# Patient Record
Sex: Female | Born: 1975 | Race: White | Hispanic: No | Marital: Married | Smoking: Former smoker
Health system: Southern US, Community
[De-identification: ages and names within clinical notes are randomized; demographics above are authoritative.]

## PROBLEM LIST (undated history)

## (undated) ENCOUNTER — Ambulatory Visit: Admission: EM

## (undated) DIAGNOSIS — Z803 Family history of malignant neoplasm of breast: Secondary | ICD-10-CM

## (undated) DIAGNOSIS — Z923 Personal history of irradiation: Secondary | ICD-10-CM

## (undated) DIAGNOSIS — I10 Essential (primary) hypertension: Secondary | ICD-10-CM

## (undated) DIAGNOSIS — N159 Renal tubulo-interstitial disease, unspecified: Secondary | ICD-10-CM

## (undated) DIAGNOSIS — D649 Anemia, unspecified: Secondary | ICD-10-CM

## (undated) DIAGNOSIS — R569 Unspecified convulsions: Secondary | ICD-10-CM

## (undated) DIAGNOSIS — S82843A Displaced bimalleolar fracture of unspecified lower leg, initial encounter for closed fracture: Secondary | ICD-10-CM

## (undated) DIAGNOSIS — K219 Gastro-esophageal reflux disease without esophagitis: Secondary | ICD-10-CM

## (undated) DIAGNOSIS — M549 Dorsalgia, unspecified: Secondary | ICD-10-CM

## (undated) DIAGNOSIS — C50919 Malignant neoplasm of unspecified site of unspecified female breast: Secondary | ICD-10-CM

## (undated) HISTORY — PX: SPINE SURGERY: SHX786

## (undated) HISTORY — PX: UPPER GASTROINTESTINAL ENDOSCOPY: SHX188

## (undated) HISTORY — PX: BACK SURGERY: SHX140

## (undated) HISTORY — DX: Essential (primary) hypertension: I10

## (undated) HISTORY — PX: WISDOM TOOTH EXTRACTION: SHX21

## (undated) HISTORY — DX: Dorsalgia, unspecified: M54.9

## (undated) HISTORY — DX: Gastro-esophageal reflux disease without esophagitis: K21.9

## (undated) HISTORY — DX: Family history of malignant neoplasm of breast: Z80.3

## (undated) HISTORY — PX: COLONOSCOPY: SHX174

---

## 2000-08-23 ENCOUNTER — Other Ambulatory Visit: Admission: RE | Admit: 2000-08-23 | Discharge: 2000-08-23 | Payer: Self-pay | Admitting: Gynecology

## 2003-11-30 ENCOUNTER — Encounter: Admission: RE | Admit: 2003-11-30 | Discharge: 2003-11-30 | Payer: Self-pay | Admitting: Family Medicine

## 2003-12-29 ENCOUNTER — Other Ambulatory Visit: Admission: RE | Admit: 2003-12-29 | Discharge: 2003-12-29 | Payer: Self-pay | Admitting: Family Medicine

## 2004-07-22 ENCOUNTER — Inpatient Hospital Stay (HOSPITAL_COMMUNITY): Admission: EM | Admit: 2004-07-22 | Discharge: 2004-07-24 | Payer: Self-pay

## 2004-07-22 ENCOUNTER — Encounter (INDEPENDENT_AMBULATORY_CARE_PROVIDER_SITE_OTHER): Payer: Self-pay | Admitting: *Deleted

## 2005-05-16 LAB — HM COLONOSCOPY

## 2005-08-14 ENCOUNTER — Other Ambulatory Visit: Admission: RE | Admit: 2005-08-14 | Discharge: 2005-08-14 | Payer: Self-pay | Admitting: Obstetrics and Gynecology

## 2006-02-12 ENCOUNTER — Inpatient Hospital Stay (HOSPITAL_COMMUNITY): Admission: AD | Admit: 2006-02-12 | Discharge: 2006-02-16 | Payer: Self-pay | Admitting: Obstetrics and Gynecology

## 2006-02-13 ENCOUNTER — Encounter (INDEPENDENT_AMBULATORY_CARE_PROVIDER_SITE_OTHER): Payer: Self-pay | Admitting: Specialist

## 2006-02-17 ENCOUNTER — Encounter: Admission: RE | Admit: 2006-02-17 | Discharge: 2006-03-19 | Payer: Self-pay | Admitting: Obstetrics and Gynecology

## 2006-03-20 ENCOUNTER — Encounter: Admission: RE | Admit: 2006-03-20 | Discharge: 2006-04-18 | Payer: Self-pay | Admitting: Obstetrics and Gynecology

## 2006-04-09 ENCOUNTER — Ambulatory Visit: Payer: Self-pay | Admitting: Family Medicine

## 2006-05-27 ENCOUNTER — Ambulatory Visit: Payer: Self-pay | Admitting: Family Medicine

## 2006-08-01 ENCOUNTER — Ambulatory Visit: Payer: Self-pay | Admitting: Internal Medicine

## 2006-09-18 DIAGNOSIS — K219 Gastro-esophageal reflux disease without esophagitis: Secondary | ICD-10-CM | POA: Insufficient documentation

## 2006-09-25 ENCOUNTER — Ambulatory Visit: Payer: Self-pay | Admitting: Internal Medicine

## 2006-09-25 LAB — CONVERTED CEMR LAB
Bilirubin Urine: NEGATIVE
Hemoglobin, Urine: NEGATIVE
Ketones, ur: NEGATIVE mg/dL
Nitrite: NEGATIVE
Protein, ur: NEGATIVE mg/dL
RBC / HPF: NONE SEEN (ref <3)
Specific Gravity, Urine: 1.01 (ref 1.005–1.03)
Urine Glucose: NEGATIVE mg/dL
Urobilinogen, UA: 0.2 (ref 0.0–1.0)
pH: 6 (ref 5.0–8.0)

## 2006-09-27 ENCOUNTER — Ambulatory Visit: Payer: Self-pay | Admitting: Family Medicine

## 2006-09-27 LAB — CONVERTED CEMR LAB
Basophils Absolute: 0 10*3/uL (ref 0.0–0.1)
Basophils Relative: 0.2 % (ref 0.0–1.0)
Chlamydia, DNA Probe: NEGATIVE
Eosinophils Absolute: 0.1 10*3/uL (ref 0.0–0.6)
Eosinophils Relative: 0.8 % (ref 0.0–5.0)
GC Probe Amp, Genital: NEGATIVE
HCT: 34.1 % — ABNORMAL LOW (ref 36.0–46.0)
Hemoglobin: 11.8 g/dL — ABNORMAL LOW (ref 12.0–15.0)
Lymphocytes Relative: 12.9 % (ref 12.0–46.0)
MCHC: 34.7 g/dL (ref 30.0–36.0)
MCV: 83.8 fL (ref 78.0–100.0)
Monocytes Absolute: 0.9 10*3/uL — ABNORMAL HIGH (ref 0.2–0.7)
Monocytes Relative: 9.1 % (ref 3.0–11.0)
Neutro Abs: 7.7 10*3/uL (ref 1.4–7.7)
Neutrophils Relative %: 77 % (ref 43.0–77.0)
Platelets: 284 10*3/uL (ref 150–400)
RBC: 4.07 M/uL (ref 3.87–5.11)
RDW: 11.9 % (ref 11.5–14.6)
WBC: 10 10*3/uL (ref 4.5–10.5)

## 2006-10-09 ENCOUNTER — Ambulatory Visit: Payer: Self-pay | Admitting: Internal Medicine

## 2006-11-21 ENCOUNTER — Ambulatory Visit: Payer: Self-pay | Admitting: Internal Medicine

## 2007-04-11 ENCOUNTER — Telehealth (INDEPENDENT_AMBULATORY_CARE_PROVIDER_SITE_OTHER): Payer: Self-pay | Admitting: *Deleted

## 2007-04-14 ENCOUNTER — Telehealth (INDEPENDENT_AMBULATORY_CARE_PROVIDER_SITE_OTHER): Payer: Self-pay | Admitting: Family Medicine

## 2007-06-25 ENCOUNTER — Ambulatory Visit: Payer: Self-pay | Admitting: Family Medicine

## 2007-06-25 DIAGNOSIS — K2970 Gastritis, unspecified, without bleeding: Secondary | ICD-10-CM | POA: Insufficient documentation

## 2007-06-25 DIAGNOSIS — K2990 Gastroduodenitis, unspecified, without bleeding: Secondary | ICD-10-CM

## 2007-06-25 DIAGNOSIS — M549 Dorsalgia, unspecified: Secondary | ICD-10-CM | POA: Insufficient documentation

## 2007-06-25 DIAGNOSIS — N912 Amenorrhea, unspecified: Secondary | ICD-10-CM | POA: Insufficient documentation

## 2007-06-25 LAB — CONVERTED CEMR LAB
Beta hcg, urine, semiquantitative: NEGATIVE
Bilirubin Urine: NEGATIVE
Blood in Urine, dipstick: NEGATIVE
Glucose, Urine, Semiquant: NEGATIVE
Ketones, urine, test strip: NEGATIVE
Nitrite: NEGATIVE
Protein, U semiquant: NEGATIVE
Specific Gravity, Urine: 1.01
Urobilinogen, UA: NEGATIVE
WBC Urine, dipstick: NEGATIVE
pH: 6.5

## 2007-08-05 ENCOUNTER — Telehealth (INDEPENDENT_AMBULATORY_CARE_PROVIDER_SITE_OTHER): Payer: Self-pay | Admitting: *Deleted

## 2007-08-13 ENCOUNTER — Telehealth (INDEPENDENT_AMBULATORY_CARE_PROVIDER_SITE_OTHER): Payer: Self-pay | Admitting: *Deleted

## 2007-08-29 ENCOUNTER — Ambulatory Visit: Payer: Self-pay | Admitting: Family Medicine

## 2007-10-07 ENCOUNTER — Telehealth (INDEPENDENT_AMBULATORY_CARE_PROVIDER_SITE_OTHER): Payer: Self-pay | Admitting: *Deleted

## 2007-10-08 ENCOUNTER — Ambulatory Visit: Payer: Self-pay | Admitting: Family Medicine

## 2007-10-08 ENCOUNTER — Encounter (INDEPENDENT_AMBULATORY_CARE_PROVIDER_SITE_OTHER): Payer: Self-pay | Admitting: Family Medicine

## 2007-10-08 DIAGNOSIS — IMO0002 Reserved for concepts with insufficient information to code with codable children: Secondary | ICD-10-CM

## 2007-10-08 LAB — CONVERTED CEMR LAB
Bilirubin Urine: NEGATIVE
Blood in Urine, dipstick: NEGATIVE
Glucose, Urine, Semiquant: NEGATIVE
Ketones, urine, test strip: NEGATIVE
Nitrite: NEGATIVE
Protein, U semiquant: NEGATIVE
Specific Gravity, Urine: 1.02
Urobilinogen, UA: NEGATIVE
WBC Urine, dipstick: NEGATIVE
pH: 6.5

## 2007-10-09 ENCOUNTER — Telehealth (INDEPENDENT_AMBULATORY_CARE_PROVIDER_SITE_OTHER): Payer: Self-pay | Admitting: *Deleted

## 2007-10-15 ENCOUNTER — Telehealth (INDEPENDENT_AMBULATORY_CARE_PROVIDER_SITE_OTHER): Payer: Self-pay | Admitting: *Deleted

## 2007-11-18 ENCOUNTER — Telehealth (INDEPENDENT_AMBULATORY_CARE_PROVIDER_SITE_OTHER): Payer: Self-pay | Admitting: *Deleted

## 2007-11-18 ENCOUNTER — Ambulatory Visit: Payer: Self-pay | Admitting: Family Medicine

## 2007-11-18 DIAGNOSIS — Z87898 Personal history of other specified conditions: Secondary | ICD-10-CM

## 2007-11-27 ENCOUNTER — Telehealth (INDEPENDENT_AMBULATORY_CARE_PROVIDER_SITE_OTHER): Payer: Self-pay | Admitting: *Deleted

## 2007-11-27 ENCOUNTER — Ambulatory Visit: Payer: Self-pay | Admitting: Family Medicine

## 2007-11-28 ENCOUNTER — Encounter: Payer: Self-pay | Admitting: Family Medicine

## 2007-12-03 ENCOUNTER — Encounter (INDEPENDENT_AMBULATORY_CARE_PROVIDER_SITE_OTHER): Payer: Self-pay | Admitting: *Deleted

## 2008-01-21 ENCOUNTER — Telehealth (INDEPENDENT_AMBULATORY_CARE_PROVIDER_SITE_OTHER): Payer: Self-pay | Admitting: *Deleted

## 2008-01-22 ENCOUNTER — Ambulatory Visit: Payer: Self-pay | Admitting: Family Medicine

## 2008-02-02 ENCOUNTER — Telehealth (INDEPENDENT_AMBULATORY_CARE_PROVIDER_SITE_OTHER): Payer: Self-pay | Admitting: *Deleted

## 2008-02-02 ENCOUNTER — Ambulatory Visit: Payer: Self-pay | Admitting: Family Medicine

## 2008-02-02 LAB — CONVERTED CEMR LAB
Bilirubin Urine: NEGATIVE
Blood in Urine, dipstick: NEGATIVE
Glucose, Urine, Semiquant: NEGATIVE
Ketones, urine, test strip: NEGATIVE
Nitrite: NEGATIVE
Protein, U semiquant: NEGATIVE
Specific Gravity, Urine: 1.02
Urobilinogen, UA: NEGATIVE
WBC Urine, dipstick: NEGATIVE
pH: 6.5

## 2008-02-13 ENCOUNTER — Ambulatory Visit: Payer: Self-pay | Admitting: Internal Medicine

## 2008-03-03 ENCOUNTER — Telehealth (INDEPENDENT_AMBULATORY_CARE_PROVIDER_SITE_OTHER): Payer: Self-pay | Admitting: *Deleted

## 2008-03-11 ENCOUNTER — Ambulatory Visit: Payer: Self-pay | Admitting: Family Medicine

## 2008-03-15 ENCOUNTER — Encounter: Admission: RE | Admit: 2008-03-15 | Discharge: 2008-03-15 | Payer: Self-pay | Admitting: Family Medicine

## 2008-03-16 ENCOUNTER — Telehealth (INDEPENDENT_AMBULATORY_CARE_PROVIDER_SITE_OTHER): Payer: Self-pay | Admitting: *Deleted

## 2008-03-19 ENCOUNTER — Telehealth (INDEPENDENT_AMBULATORY_CARE_PROVIDER_SITE_OTHER): Payer: Self-pay | Admitting: *Deleted

## 2008-03-29 ENCOUNTER — Telehealth (INDEPENDENT_AMBULATORY_CARE_PROVIDER_SITE_OTHER): Payer: Self-pay | Admitting: *Deleted

## 2008-04-09 ENCOUNTER — Encounter: Payer: Self-pay | Admitting: Family Medicine

## 2008-05-27 ENCOUNTER — Telehealth (INDEPENDENT_AMBULATORY_CARE_PROVIDER_SITE_OTHER): Payer: Self-pay | Admitting: *Deleted

## 2008-05-28 ENCOUNTER — Telehealth: Payer: Self-pay | Admitting: Family Medicine

## 2008-06-03 ENCOUNTER — Encounter: Payer: Self-pay | Admitting: Family Medicine

## 2008-06-03 ENCOUNTER — Other Ambulatory Visit: Admission: RE | Admit: 2008-06-03 | Discharge: 2008-06-03 | Payer: Self-pay | Admitting: Family Medicine

## 2008-06-03 ENCOUNTER — Ambulatory Visit: Payer: Self-pay | Admitting: Family Medicine

## 2008-06-07 ENCOUNTER — Encounter: Payer: Self-pay | Admitting: Family Medicine

## 2008-06-10 ENCOUNTER — Encounter (INDEPENDENT_AMBULATORY_CARE_PROVIDER_SITE_OTHER): Payer: Self-pay | Admitting: *Deleted

## 2008-06-17 ENCOUNTER — Telehealth (INDEPENDENT_AMBULATORY_CARE_PROVIDER_SITE_OTHER): Payer: Self-pay | Admitting: *Deleted

## 2008-09-17 ENCOUNTER — Ambulatory Visit: Payer: Self-pay | Admitting: Family Medicine

## 2008-09-17 LAB — CONVERTED CEMR LAB: Rapid Strep: NEGATIVE

## 2008-09-21 ENCOUNTER — Telehealth (INDEPENDENT_AMBULATORY_CARE_PROVIDER_SITE_OTHER): Payer: Self-pay | Admitting: *Deleted

## 2008-09-28 ENCOUNTER — Encounter (INDEPENDENT_AMBULATORY_CARE_PROVIDER_SITE_OTHER): Payer: Self-pay | Admitting: *Deleted

## 2008-09-30 ENCOUNTER — Ambulatory Visit: Payer: Self-pay | Admitting: Family Medicine

## 2008-09-30 DIAGNOSIS — L0291 Cutaneous abscess, unspecified: Secondary | ICD-10-CM | POA: Insufficient documentation

## 2008-09-30 DIAGNOSIS — L0390 Cellulitis, unspecified: Secondary | ICD-10-CM

## 2008-11-19 ENCOUNTER — Ambulatory Visit: Payer: Self-pay | Admitting: Family Medicine

## 2008-11-19 ENCOUNTER — Encounter: Payer: Self-pay | Admitting: Family Medicine

## 2008-11-19 DIAGNOSIS — D239 Other benign neoplasm of skin, unspecified: Secondary | ICD-10-CM

## 2008-11-19 HISTORY — DX: Other benign neoplasm of skin, unspecified: D23.9

## 2008-11-23 ENCOUNTER — Encounter (INDEPENDENT_AMBULATORY_CARE_PROVIDER_SITE_OTHER): Payer: Self-pay | Admitting: *Deleted

## 2008-11-25 ENCOUNTER — Ambulatory Visit: Payer: Self-pay | Admitting: Family Medicine

## 2009-06-01 ENCOUNTER — Ambulatory Visit: Payer: Self-pay | Admitting: Internal Medicine

## 2009-06-01 DIAGNOSIS — M545 Low back pain, unspecified: Secondary | ICD-10-CM | POA: Insufficient documentation

## 2009-06-01 DIAGNOSIS — R319 Hematuria, unspecified: Secondary | ICD-10-CM

## 2009-06-01 LAB — CONVERTED CEMR LAB
Bilirubin Urine: NEGATIVE
Glucose, Urine, Semiquant: NEGATIVE
Ketones, urine, test strip: NEGATIVE
Nitrite: NEGATIVE
Protein, U semiquant: NEGATIVE
Specific Gravity, Urine: <1.005
Urobilinogen, UA: 0.2
pH: 5

## 2009-06-06 ENCOUNTER — Telehealth (INDEPENDENT_AMBULATORY_CARE_PROVIDER_SITE_OTHER): Payer: Self-pay | Admitting: *Deleted

## 2009-09-19 ENCOUNTER — Telehealth: Payer: Self-pay | Admitting: Family Medicine

## 2009-09-20 ENCOUNTER — Telehealth (INDEPENDENT_AMBULATORY_CARE_PROVIDER_SITE_OTHER): Payer: Self-pay | Admitting: *Deleted

## 2009-12-16 ENCOUNTER — Encounter: Payer: Self-pay | Admitting: Family Medicine

## 2010-01-17 ENCOUNTER — Encounter: Payer: Self-pay | Admitting: Family Medicine

## 2010-01-27 ENCOUNTER — Encounter: Payer: Self-pay | Admitting: Family Medicine

## 2010-01-31 ENCOUNTER — Encounter: Payer: Self-pay | Admitting: Family Medicine

## 2010-08-15 ENCOUNTER — Encounter: Payer: Self-pay | Admitting: Internal Medicine

## 2010-08-15 ENCOUNTER — Ambulatory Visit
Admission: RE | Admit: 2010-08-15 | Discharge: 2010-08-15 | Payer: Self-pay | Source: Home / Self Care | Attending: Family Medicine | Admitting: Family Medicine

## 2010-08-15 DIAGNOSIS — R591 Generalized enlarged lymph nodes: Secondary | ICD-10-CM | POA: Insufficient documentation

## 2010-08-15 DIAGNOSIS — R599 Enlarged lymph nodes, unspecified: Secondary | ICD-10-CM | POA: Insufficient documentation

## 2010-08-27 ENCOUNTER — Encounter: Payer: Self-pay | Admitting: Family Medicine

## 2010-09-07 NOTE — Consult Note (Signed)
Summary: [facility]  [facility]   Imported By: Lanelle Bal 02/17/2010 11:12:54  _____________________________________________________________________  External Attachment:    Type:   Image     Comment:   External Document

## 2010-09-07 NOTE — Assessment & Plan Note (Signed)
Summary: chest congestion//fever//lch   Vital Signs:  Patient profile:   35 year old female Height:      63 inches (160.02 cm) Weight:      127.50 pounds (57.95 kg) BMI:     22.67 Temp:     100.5 degrees F (38.06 degrees C) oral Resp:     13 per minute BP sitting:   110 / 74  (left arm)  Vitals Entered By: Lucious Groves CMA (August 15, 2010 12:25 PM) CC: Possible URI or strep./kb, URI symptoms Is Patient Diabetic? No Pain Assessment Patient in pain? no      Comments Patient notes that she has been having fever, sore throat, and HA. She denies congestion, cough, mucous production, SOB, and chest pain./kb   Primary Care Provider:  Laury Axon  CC:  Possible URI or strep./kb and URI symptoms.  History of Present Illness: URI Symptoms      This is a 35 year old woman who presents with URI symptoms; onset as severe ST 01/09, progressive through the night.  The patient denies purulent nasal discharge, dry cough, and earache.  Associated symptoms include fever of 100.5-103 degrees.  The patient denies stiff neck, dyspnea, and wheezing.  The patient denies headache.  Risk factors for Strep sinusitis include tender adenopathy.  The patient denies the following risk factors for Strep sinusitis: unilateral facial pain and tooth pain.    Current Medications (verified): 1)  Valtrex 1 Gm  Tabs (Valacyclovir Hcl) .Marland Kitchen.. 1 Three Times A Day] 2)  Ortho Tri-Cyclen (28) 0.035 Mg  Tabs (Norgestimate-Ethinyl Estradiol) .... As Directed. 3)  Meloxicam 15 Mg  Tabs (Meloxicam) .Marland Kitchen.. 1 By Mouth Once Daily 4)  Flexeril 10 Mg Tabs (Cyclobenzaprine Hcl) .Marland Kitchen.. 1 By Mouth Three Times A Day As Needed.  Allergies (verified): 1)  ! Reglan 2)  ! Septra  Physical Exam  General:  Uncomfortable but in no acute distress; alert,appropriate and cooperative throughout examination Ears:  External ear exam shows no significant lesions or deformities.  Otoscopic examination reveals clear canals, tympanic membranes are intact  bilaterally without bulging, retraction, inflammation or discharge. Hearing is grossly normal bilaterally. Nose:  External nasal examination shows no deformity or inflammation. Nasal mucosa are pink and moist without lesions or exudates. Mouth:   severe pharyngeal erythema ; 1.5 +  tonsil hypertrophy Lungs:  Normal respiratory effort, chest expands symmetrically. Lungs are clear to auscultation, no crackles or wheezes. Heart:  Normal rate and regular rhythm. S1 and S2 normal without gallop, murmur, click, rub.S4 Abdomen:  Bowel sounds positive,abdomen soft and non-tender without masses, organomegaly or hernias noted. Cervical Nodes:  Enlarged LA bilaterally Axillary Nodes:  No palpable lymphadenopathy   Impression & Recommendations:  Problem # 1:  PHARYNGITIS-ACUTE (ICD-462)  Her updated medication list for this problem includes:    Meloxicam 15 Mg Tabs (Meloxicam) .Marland Kitchen... 1 by mouth once daily    Azithromycin 250 Mg Tabs (Azithromycin) .Marland Kitchen... As per pack  Orders: Rapid Strep (19147): negative Monospot (82956): negative  Problem # 2:  CERVICAL LYMPHADENOPATHY (ICD-785.6)  Orders: Rapid Strep (21308) Monospot (65784)  Her updated medication list for this problem includes:    Azithromycin 250 Mg Tabs (Azithromycin) .Marland Kitchen... As per pack  Complete Medication List: 1)  Valtrex 1 Gm Tabs (Valacyclovir hcl) .Marland Kitchen.. 1 three times a day] 2)  Ortho Tri-cyclen (28) 0.035 Mg Tabs (Norgestimate-ethinyl estradiol) .... As directed. 3)  Meloxicam 15 Mg Tabs (Meloxicam) .Marland Kitchen.. 1 by mouth once daily 4)  Flexeril 10 Mg Tabs (  Cyclobenzaprine hcl) .Marland Kitchen.. 1 by mouth three times a day as needed. 5)  Prednisone 20 Mg Tabs (Prednisone) .Marland Kitchen.. 1 two times a day with meals 6)  Azithromycin 250 Mg Tabs (Azithromycin) .... As per pack  Patient Instructions: 1)  Zicam Melts as needed for ST. 2)  Drink as much NON dairy  fluid as you can tolerate for the next few days. 3)  Take 650-1000mg  of Tylenol every 4-6 hours as  needed for relief of pain or comfort of fever AVOID taking more than 4000mg   in a 24 hour period (can cause liver damage in higher doses) OR take 400-600mg  of Ibuprofen (Advil, Motrin) with food every 4-6 hours as needed for relief of pain or comfort of fever. Prescriptions: AZITHROMYCIN 250 MG TABS (AZITHROMYCIN) as per pack  #1 x 0   Entered and Authorized by:   Marga Melnick MD   Signed by:   Marga Melnick MD on 08/15/2010   Method used:   [facility] [phone]* (retail)       [address]       [place], [place]  [address]       Ph: [phone]       Fax: [phone]   RxID:   [id number] PREDNISONE 20 MG TABS (PREDNISONE) 1 two times a day with meals  #10 x 0   Entered and Authorized by:   Marga Melnick MD   Signed by:   Marga Melnick MD on 08/15/2010   Method used:   Electronically to        [facility] [phone]* (retail)       [address]       [place], [place]  [address]       Ph: [phone]       Fax: [phone]   RxID:   [id number]    Orders Added: 1)  Est. Patient Level III [30160] 2)  Rapid Strep [87880] 3)  Monospot [10932]

## 2010-09-07 NOTE — Letter (Signed)
Summary: Out of Work  [facility] [employer]  [address] [place], [place] [address]   Phone: [phone]  Fax: [phone]    August 15, 2010   Employee:  ABIE KILLIAN    To Whom It May Concern:   For Medical reasons, please excuse the above named employee from work for the following dates:  Start:   08/15/10  End:   08/17/10  If you need additional information, please feel free to contact our office.         Sincerely,    Marga Melnick MD

## 2010-09-07 NOTE — Letter (Signed)
Summary: [facility]  [facility]   Imported By: Lanelle Bal 02/14/2010 12:44:43  _____________________________________________________________________  External Attachment:    Type:   Image     Comment:   External Document

## 2010-09-07 NOTE — Progress Notes (Signed)
Summary: refill  Phone Note Refill Request Message from:  Fax from Pharmacy on September 19, 2009 12:24 PM  cyclobenzaprine 10mg , [facility] [address] fax [phone]   Method Requested: Fax to Local Pharmacy Next Appointment Scheduled: no appt Initial call taken by: Barb Merino,  September 19, 2009 12:25 PM  Follow-up for Phone Call        last ov- 06/01/09 (acute with dr. hopper) last filled 07/05/09. Army Fossa CMA  September 19, 2009 1:21 PM   Additional Follow-up for Phone Call Additional follow up Details #1::        ok to refill x1 Additional Follow-up by: Loreen Freud DO,  September 19, 2009 1:55 PM    Prescriptions: FLEXERIL 10 MG TABS (CYCLOBENZAPRINE HCL) 1 by mouth three times a day as needed.  #30 x 0   Entered by:   Army Fossa CMA   Authorized by:   Loreen Freud DO   Signed by:   Army Fossa CMA on 09/19/2009   Method used:   [facility]ronically to        [facility]  [phone]* (retail)       [address] [place], [place]  [address]       Ph: [phone] or [phone]       Fax: [phone]   RxID:   [id number]

## 2010-09-07 NOTE — Progress Notes (Signed)
Summary: Refill Request  Phone Note Refill Request Message from:  [facility]arm[facility] Fax #: [phone]  Refills Requested: Medication #1:  Cyclobenzaprine 10mg  tablet, take 1 tab 3x a day prn   Dosage confirmed as above?Dosage Confirmed Initial call taken by: Harold Barban,  September 20, 2009 2:10 PM  Follow-up for Phone Call        Spoke with pharmacist they recieved rx yesterday. Army Fossa CMA  September 20, 2009 2:43 PM

## 2010-09-07 NOTE — Consult Note (Signed)
Summary: [facility]  [facility]   Imported By: Lanelle Bal 01/04/2010 14:11:26  _____________________________________________________________________  External Attachment:    Type:   Image     Comment:   External Document

## 2010-09-07 NOTE — Consult Note (Signed)
Summary: [facility]  [facility]   Imported By: Lanelle Bal 02/09/2010 10:30:55  _____________________________________________________________________  External Attachment:    Type:   Image     Comment:   External Document

## 2010-10-17 ENCOUNTER — Ambulatory Visit (INDEPENDENT_AMBULATORY_CARE_PROVIDER_SITE_OTHER): Payer: 59 | Admitting: Family Medicine

## 2010-10-17 ENCOUNTER — Encounter: Payer: Self-pay | Admitting: Family Medicine

## 2010-10-17 DIAGNOSIS — M25539 Pain in unspecified wrist: Secondary | ICD-10-CM

## 2010-10-17 DIAGNOSIS — M259 Joint disorder, unspecified: Secondary | ICD-10-CM | POA: Insufficient documentation

## 2010-10-17 HISTORY — DX: Pain in unspecified wrist: M25.539

## 2010-10-18 ENCOUNTER — Telehealth: Payer: Self-pay | Admitting: Family Medicine

## 2010-10-24 NOTE — Assessment & Plan Note (Signed)
Summary: Left wrist swollen  (pt is rt handed)///sph   Vital Signs:  Patient profile:   35 year old female Weight:      122.4 pounds Temp:     98.1 degrees F oral BP sitting:   120 / 74  (right arm) Cuff size:   regular  Vitals Entered By: Almeta Monas CMA Duncan Dull) (October 17, 2010 11:31 AM) CC: x3weeks c/o left wrist pain and swelling   History of Present Illness: Pt here c/o L wrist pain and lump.  No injury.  Current Medications (verified): 1)  Valtrex 1 Gm  Tabs (Valacyclovir Hcl) .Marland Kitchen.. 1 Three Times A Day] 2)  Ortho Tri-Cyclen (28) 0.035 Mg  Tabs (Norgestimate-Ethinyl Estradiol) .... As Directed. 3)  Meloxicam 15 Mg  Tabs (Meloxicam) .Marland Kitchen.. 1 By Mouth Once Daily 4)  Flexeril 10 Mg Tabs (Cyclobenzaprine Hcl) .Marland Kitchen.. 1 By Mouth Three Times A Day As Needed.  Allergies (verified): 1)  ! Reglan 2)  ! Septra  Past History:  Past medical, surgical, family and social histories (including risk factors) reviewed for relevance to current acute and chronic problems.  Past Medical History: Reviewed history from 02/13/2008 and no changes required. GERD GENITAL HERPES, HX OF (ICD-V13.8) BACK PAIN (ICD-724.5)  Past Surgical History: Reviewed history from 02/13/2008 and no changes required. had a colonoscopy aprox. 2006  Family History: Reviewed history from 06/03/2008 and no changes required. none  Social History: Reviewed history from 06/03/2008 and no changes required. Occupation:  Dance movement psychotherapist Married Current Smoker Alcohol use-yes Drug use-no Regular exercise-yes  Review of Systems      See HPI  Physical Exam  General:  Well-developed,well-nourished,in no acute distress; alert,appropriate and cooperative throughout examination Msk:  L wrist--- dorsal side--- + swelling, soft pain with flexion Psych:  Cognition and judgment appear intact. Alert and cooperative with normal attention span and concentration. No apparent delusions,  illusions, hallucinations   Impression & Recommendations:  Problem # 1:  WRIST PAIN, LEFT (ICD-719.43) ortho referral held at pt request Orders: Ankle / Wrist Splint (A4570)  Complete Medication List: 1)  Valtrex 1 Gm Tabs (Valacyclovir hcl) .Marland Kitchen.. 1 three times a day] 2)  Ortho Tri-cyclen (28) 0.035 Mg Tabs (Norgestimate-ethinyl estradiol) .... As directed. 3)  Meloxicam 15 Mg Tabs (Meloxicam) .Marland Kitchen.. 1 by mouth once daily 4)  Flexeril 10 Mg Tabs (Cyclobenzaprine hcl) .Marland Kitchen.. 1 by mouth three times a day as needed.   Orders Added: 1)  Ankle / Wrist Splint [A4570] 2)  Est. Patient Level III [16109]

## 2010-10-24 NOTE — Progress Notes (Signed)
Summary: wrist no better  Phone Note Call from Patient Call back at Home Phone [phone]   Caller: Patient Summary of Call: Pt states that wirst swelling and pain has increse despite wearing brace all night. Pt note that she is unable to move fingers without them hurting. Pls advise.........Marland KitchenFelecia Deloach CMA  October 18, 2010 10:02 AM   Follow-up for Phone Call        she needs to see ortho--- I thought she said she already had any appointment.  we can see if we can make it sooner. Follow-up by: Loreen Freud DO,  October 18, 2010 10:43 AM  Additional Follow-up for Phone Call Additional follow up Details #1::        I spoke with patient and she advised that she had an appt on the 19th for a f/u.... I called [facility] and advised of the situation and they are willing to see her tomorrow at 2pm.... Almeta Monas CMA Duncan Dull)  October 18, 2010 2:26 PM  Additional Follow-up by: Almeta Monas CMA Duncan Dull),  October 18, 2010 2:26 PM

## 2010-12-22 NOTE — Op Note (Signed)
[name], [name]             ACCOUNT NO.:  [id number]   MEDICAL RECORD NO.:  [id number]          PATIENT TYPE:  INP   LOCATION:  3007                         FACILITY:  [facility]   PHYSICIAN:  John C. Madilyn Fireman, M.D.    DATE OF BIRTH:  05/30/1976   DATE OF PROCEDURE:  07/22/2004  DATE OF DISCHARGE:                                 OPERATIVE REPORT   PROCEDURE PERFORMED:  Colonoscopy with biopsy.   ENDOSCOPIST:  Barrie Folk, M.D.   INDICATIONS FOR PROCEDURE:  Rectal bleeding with abdominal cramps.   DESCRIPTION OF PROCEDURE:  The patient was placed in the left lateral  decubitus position and placed on the pulse monitor with continuous low-flow  oxygen delivered by nasal cannula.  The patient was sedated with 80 mcg of  IV fentanyl and 10 mg of IV Versed.  The Olympus video colonoscope was  inserted into the rectum and advanced to the cecum, confirmed by  transillumination of McBurney's point and visualization of the ileocecal  valve and appendiceal orifice.  The study was done unprepped and there was  good visualization up to about 50 to 60 cm where there was some semiformed  stool coating a large portion of the walls.  However, with lavage, I could  see representative areas of most of the colon.  The cecum, ascending,  transverse colon appeared normal within the limitations of the prep.  At  approximately 40 cm there was a transition to erythema granularity and  possible submucosal hemorrhages with friability and some bloody fluid noted  in the lumen.  This became confluent over about 5 cm, persisted down to  about 30 cm and then became patchy down to about 24 cm where it transitioned  back into normal mucosa.  The normal mucosa persisted down to the rectum.  There were no diverticula, no ulcers, arteriovenous malformations or polyps  seen.  Biopsies were taken of the most visibly inflamed areas.  The scope  was then withdrawn and the patient returned to the recovery room in stable  condition.  She tolerated the procedure well.  There were no immediate  complications.   IMPRESSION:  Colitis of the descending and sigmoid colon with appearance and  distribution most suggestive of ischemia although inflammatory bowel disease  or infectious colitis also in differential diagnosis.   PLAN:  Will begin Cipro for now.  Will await biopsy results and will obtain  stool for enteric pathogens.       JCH/MEDQ  D:  07/22/2004  T:  07/24/2004  Job:  [id number]

## 2010-12-22 NOTE — H&P (Signed)
[name], [name]             ACCOUNT NO.:  [id number]   MEDICAL RECORD NO.:  [id number]          PATIENT TYPE:  INP   LOCATION:  9174                          [facility]  [facility]   PHYSICIAN:  Guy Sandifer. Henderson Cloud, M.D. DATE OF BIRTH:  1976-03-18   DATE OF ADMISSION:  02/12/2006  DATE OF DISCHARGE:                                HISTORY & PHYSICAL   CHIEF COMPLAINT:  Low estimated fetal weight.   HISTORY OF PRESENT ILLNESS:  This patient is a 35 year old, married white  female, gravida 1, para 0, with an EDC of February 09, 2006 who had a regularly  scheduled visit today in the office.  Cervix is 2-3 cm dilated, 50% effaced,  -3 station.  Evaluation included a nonstress test which was reactive.  Ultrasound revealed a vertex infant consistent with 2365 grams which is less  than one-third percentile as well as an AFI of 3.9 which is less than the  third percentile. Bio-physical 6 out of 8.  The patient denies vaginal  bleeding, leaking of fluid, blurred vision, headache, or epigastric pain.   Past medical history, past surgical history, family history, obstetric  history:  See prenatal history and physical.   MEDICATIONS:  Prenatal vitamins.   ALLERGIES:  REGLAN leading to lock jaw.   SOCIAL HISTORY:  Denies tobacco, alcohol or drug abuse.   PHYSICAL EXAMINATION:  VITAL SIGNS:  Height 5 feet 3 inches, weight 159  pounds, blood pressure 118/64.  LUNGS:  Clear to auscultation.  HEART:  Regular rate and rhythm.  BREASTS: Not examined.  ABDOMEN:  Gravid.  No epigastric tenderness.  PELVIC:  Cervix 2-3 cm dilated, 50% effaced, -3 station.  Posterior vertex.  EXTREMITIES:  1+ edema.  Deep tendon reflexes 2+ without clonus.   ASSESSMENT:  1.  Intrauterine pregnancy at 40-3/7 weeks.  2.  Oligohydramnios.  3.  Intrauterine growth retardation.   PLAN:  Two-stage induction of labor.  The plan has been reviewed and  questions have been answered with the patient.     Guy Sandifer Henderson Cloud,  M.D.  Electronically Signed    JET/MEDQ  D:  02/12/2006  T:  02/12/2006  Job:  [id number]

## 2010-12-22 NOTE — H&P (Signed)
NAMEELIZZIE, [name]             ACCOUNT NO.:  [id number]   MEDICAL RECORD NO.:  [id number]          PATIENT TYPE:  INP   LOCATION:  1826                         FACILITY:  [facility]   PHYSICIAN:  John C. Madilyn Fireman, M.D.    DATE OF BIRTH:  10/20/1975   DATE OF ADMISSION:  07/21/2004  DATE OF DISCHARGE:                                HISTORY & PHYSICAL   CHIEF COMPLAINT:  Rectal bleeding.   HISTORY OF PRESENT ILLNESS:  The patient is a 35 year old healthy white  female who began having lower abdominal cramps, urgency, and diarrhea at  about 8:30 p.m. on December 16 after two diarrheal bowel movements that  transitioned to what she states is pure blood.  She has had about eight  bloody bowel movements since then with variable intensity cramps throughout.  She denies nausea, vomiting, fever, orthostasis, or melenic stools and felt  fine yesterday. She denies any recent travel or recent antibiotic use or any  suspicious food intake. She has no prior history of GI bleeding.  She does  not use nonsteroidal anti-inflammatory drugs.  She has had intermittent  bouts of diarrhea associated with sweating and a faint feeling in the past,  occurring about once every three months but has never been associated with  blood.   PAST MEDICAL HISTORY:  History of pyelonephritis requiring hospitalization  seven or eight years ago.   PAST SURGICAL HISTORY:  None.   MEDICATIONS:  1.  Ortho Tri-Cyclen.  2.  Prilosec.   ALLERGIES:  1.  REGLAN.  2.  HYDROCODONE.   SOCIAL HISTORY:  The patient is married.  She drinks alcohol occasionally.  She smokes about one-half pack of cigarettes a day.   PHYSICAL EXAMINATION:  GENERAL:  Well-developed, well-nourished white female  in no acute distress.  VITAL SIGNS:  Stable, and she is afebrile.  HEART:  Regular rate and rhythm without murmur.  LUNGS:  Clear.  ABDOMEN:  Soft, nondistended.  Normoactive bowel sounds.  No  hepatosplenomegaly, mass, or guarding.  There is mild diffuse tenderness in  the lower abdomen, equal on the right and the left.   LABORATORY DATA AND OTHER STUDIES:  Hemoglobin 11.8, 12.9 on recheck.  WBC  8900, platelets 336,000.  Prothrombin time 13.9.  SMA-24 completely normal.   Abdominal CT scan shows a right hepatic cyst, otherwise unremarkable, with  some subcentimeter lymph nodes near the terminal ileum suggesting possible  mesenteric adenitis.   IMPRESSION:  Lower gastrointestinal bleed, etiology unclear.  Could be  secondary to arteriovenous malformation, diverticulosis, Meckel's  diverticulum, or some sort of colitis.   PLAN:  Will probably pursue un prepped flexible sigmoidoscopy or colonoscopy  later today as initial diagnostic study.       JCH/MEDQ  D:  07/22/2004  T:  07/22/2004  Job:  [id number]   cc:   Leanne Chang, M.D.  [address]  [place]  [place] [address]  Fax: [phone]

## 2010-12-22 NOTE — Discharge Summary (Signed)
[name], [name]             ACCOUNT NO.:  [id number]   MEDICAL RECORD NO.:  [id number]          PATIENT TYPE:  INP   LOCATION:  9148                          [facility]ILITY[facility]  [facility]   PHYSICIAN:  Diana Kid. Rana Kelly, M.D.    DATE OF BIRTH:  11-Feb-1976   DATE OF ADMISSION:  02/12/2006  DATE OF DISCHARGE:  02/16/2006                                 DISCHARGE SUMMARY   ADMITTING DIAGNOSES:  1. Intrauterine pregnancy at 76 and four-sevenths weeks estimated      gestational age.  2. Small-for-gestational-age infant.  3. Low amniotic fluid index.  4. Two-stage induction of labor.   DISCHARGE DIAGNOSES:  1. Status post low transverse cesarean section secondary to nonreassuring      fetal heart tones.  2. Viable female infant.   PROCEDURE:  Primary low transverse cesarean section.   REASON FOR ADMISSION:  Please see written H&P.   HOSPITAL COURSE:  The patient is a 35 year old white married female  primigravida that was admitted to [facility] for a two-  stage induction of labor.  The patient had been seen in the office where  ultrasound had been performed which estimated fetal weight was noted to be  at 2400 g which was less than the 3rd percentile, and AFI was noted to be  3.8 which was also less than the 3rd percentile.  Cervix was examined and  found to be 2-3 cm dilated, 50% effaced, vertex at -3 station.  After  discussion with the patient she was now admitted to the hospital for a two-  stage induction of labor.  Cervidil was placed and after several hours the  patient did begin to have a contraction pattern with a variable deceleration  pattern noted.  Decelerations were noted to be in the 50s and 60s which had  a late appearance.  Cervidil was now removed and the patient was  administered oxygen and IV fluids.  Cervix was reexamined and noted to be  without change.  Due to nonreassuring fetal heart tones and cervix  nonfavorable for vaginal delivery, decision was made  to proceed with a  primary low transverse cesarean section.  The patient was now transferred to  the operating room where spinal anesthesia was administered without  difficulty.  A low transverse incision was made with the delivery of a  viable female infant weighing 4 pounds 12 ounces with Apgars of 8 at one  minute and 9 at five minutes.  Arterial cord pH was 7.30.  The patient  tolerated the procedure well and was taken to the recovery room in stable  condition.  On postoperative day #1 the patient was without complaint.  Vital signs were stable.  She was afebrile.  Abdomen soft with good return  of bowel function.  Fundus was firm and nontender.  Abdominal dressing was  noted to have a small amount of drainage noted on bandage.  Laboratory  findings revealed hemoglobin of 10.5; platelet count of 233,000; wbc count  of 12.0.  On postoperative day #2 the patient was without complaint.  She  was tolerating a regular diet  without complaints of nausea and vomiting.  Vital signs were stable.  She was afebrile.  Abdominal dressing had been  removed revealing an incision that was clean, dry and intact.  On  postoperative day #3 the patient was without complaint.  Vital signs were  stable.  She was afebrile.  Fundus firm and nontender.  Incision was clean,  dry and intact.  Staples removed and the patient was later discharged home.   CONDITION ON DISCHARGE:  Stable.   DIET REGULAR:  As tolerated.   ACTIVITY:  No heavy lifting, no driving x2 weeks, no vaginal entry.   FOLLOWUP:  The patient is to follow up in the office in 2 weeks for an  incision check.  She is to call for temperature greater than 100 degrees,  persistent nausea and vomiting, heavy vaginal bleeding, and/or redness or  drainage from the incisional site.   DISCHARGE MEDICATIONS:  1. Tylox #30 one p.o. every 4-6 hours p.r.n.  2. Motrin 600 mg every 6 hours.  3. Prenatal vitamins one p.o. daily.  4. Colace one p.o. daily  p.r.n.      [name], N.P.      [name], M.D.  Electronically Signed    CC/MEDQ  D:  03/25/2006  T:  03/25/2006  Job:  [id number]

## 2010-12-22 NOTE — Op Note (Signed)
NAMESATOMI, [name]             ACCOUNT NO.:  [id number]   MEDICAL RECORD NO.:  [id number]          PATIENT TYPE:  INP   LOCATION:  9148                          FACILITY:  [facility]   PHYSICIAN:  Guy Sandifer. Henderson Cloud, M.D. DATE OF BIRTH:  [birth year]   DATE OF PROCEDURE:  02/13/2006  DATE OF DISCHARGE:                                 OPERATIVE REPORT   PREOPERATIVE DIAGNOSES:  1.  Intrauterine pregnancy at 40-4/7 weeks' estimated additional age.  2.  Nonreassuring fetal heart tracing.   POSTOPERATIVE DIAGNOSES:  1.  Intrauterine pregnancy at 40-4/7 weeks' estimated additional age.  2.  Nonreassuring fetal heart tracing.   PROCEDURE:  Low transverse cesarean section.   SURGEON:  Guy Sandifer. Henderson Cloud, M.D.   ANESTHESIA:  Spinal, Belva Agee, M.D.   ESTIMATED BLOOD LOSS:  600 mL.   FINDINGS:  Viable female infant, Apgars of 8 and 9 at one and five minutes,  respectively.  Arterial cord pH 7.30.  Birth weight 4 pounds 12 ounces.   INDICATIONS AND CONSENT:  This patient is a 35 year old married white female  G1 P0 who in evaluation in the office today was found to have an estimated  fetal weight on ultrasound of approximately 2400 grams which, was less than  the 3rd percentile, and an AFI of 3.8, which was less than the 3rd  percentile.  Cervix is 2-3 cm dilated, 50% effaced, -3 station and vertex.  Group B strep culture is positive.  After discussion with the patient, she  is admitted to hospital for a two-stage induction of labor.  Cervidil is  placed.  After several hours of Cervidil the patient begins to contract with  variable decelerations noted.  Some become down to the 50s to 60s and have a  late appearance.  They become repetitive.  The Cervidil was removed.  The  patient is given oxygen and intravenous fluids.  Cervix is without change.  This is discussed with the patient and her husband.  Cesarean section is  recommended.  Potential risks and complications are discussed  preoperatively, including but not limited to infection; bowel, bladder or  ureteral damage; bleeding requiring transfusion of blood products with  possible transfusion reaction, HIV and hepatitis acquisition; DVT, PE and  pneumonia.  All questions are answered and consent is signed on the chart.   PROCEDURE:  The patient is taken to the operating room, where she is  identified, a spinal anesthetic is placed and she is placed in the dorsal  supine position with a 15-degree left lateral wedge.  She is then prepped  with Hibiclens, the Foley catheter was placed and the bladder is drained,  and she is draped in a sterile fashion.  After testing for adequate spinal  anesthesia, the skin is entered through a Pfannenstiel incision and  dissection is carried out in layers to the peritoneum.  The peritoneum is  incised, extended superiorly and inferiorly.  Vesicouterine peritoneum is  taken down cephalolaterally, the bladder flap is developed, and the bladder  blade is placed.  The u is then incised in a low transverse manner.  The  uterine cavity is entered bluntly with a hemostat and the uterine incision  is extended cephalolaterally with the fingers.  A small amount of clear  fluid is noted.  Vertex is easily delivered and a single nuchal cord is  reduced.  Baby is delivered, oropharynx and nasopharynx are suctioned, and  good cry and tone is noted.  Cord is clamped and cut and the baby is handed  to the waiting pediatrics team.  Placenta is manually delivered and sent to  pathology.  Uterine cavity is cleaned.  Uterus closed in two running locking  imbricating layers of 0 Monocryl suture, which achieves good hemostasis.  Tubes and ovaries are normal bilaterally.  Anterior peritoneum is closed in  running fashion with 0 Monocryl suture, which is also used to reapproximate  the pyramidalis muscle in the midline.  The anterior rectus fascia is closed  in a running fashion with 0 PDS suture and the  skin is closed with clips.  All sponge, instrument and needle counts are correct and the patient is  taken to the recovery room in stable condition.      Guy Sandifer Henderson Cloud, M.D.  Electronically Signed     JET/MEDQ  D:  02/13/2006  T:  02/13/2006  Job:  [id number]

## 2011-01-16 ENCOUNTER — Ambulatory Visit (INDEPENDENT_AMBULATORY_CARE_PROVIDER_SITE_OTHER): Payer: 59 | Admitting: Family Medicine

## 2011-01-16 DIAGNOSIS — K5900 Constipation, unspecified: Secondary | ICD-10-CM | POA: Insufficient documentation

## 2011-01-16 HISTORY — DX: Constipation, unspecified: K59.00

## 2011-01-16 NOTE — Progress Notes (Signed)
  Subjective:    Patient ID: [name], female    DOB: Mar 13, 1976, 35 y.o.   MRN: [id number]  HPI Constipation- pt's sxs started ~4-6 weeks ago.  Is on daily chronic narcotics.  Reports she has never been 'a once a day kind of girl'.  Did an enema Saturday w/ results.  Did one again last night w/out results.  Did Miralax Saturday morning, taking 2 stool softeners daily, did 'philips' last night.  Having nausea and vomiting w/ eating.   Review of Systems For ROS see HPI     Objective:   Physical Exam  Constitutional: She appears well-developed and well-nourished. No distress.  Abdominal: Soft. Bowel sounds are normal. She exhibits distension. There is tenderness (mild, diffuse TTP).          Assessment & Plan:

## 2011-01-16 NOTE — Assessment & Plan Note (Signed)
Pt's constipation is due to her chronic use of pain meds.  Should increase to miralax 2-3x/day, daily enemas, and continue stool softeners.  Stressed importance of increased fluid intake.  Reviewed red flags that should prompt immediate return.  Pt expressed understanding and is in agreement w/ plan.

## 2011-01-16 NOTE — Patient Instructions (Signed)
This is constipation related Increase the Miralax to 2-3x/day Start daily enemas- at least until you have a big blowout Continue the stool softeners (up to 4 daily) Once you move your bowels, continue daily stool softeners and once daily miralax Hang in there!!!

## 2011-03-05 ENCOUNTER — Ambulatory Visit (INDEPENDENT_AMBULATORY_CARE_PROVIDER_SITE_OTHER): Payer: 59 | Admitting: Family Medicine

## 2011-03-05 ENCOUNTER — Encounter: Payer: Self-pay | Admitting: Family Medicine

## 2011-03-05 DIAGNOSIS — A6009 Herpesviral infection of other urogenital tract: Secondary | ICD-10-CM

## 2011-03-05 DIAGNOSIS — A6000 Herpesviral infection of urogenital system, unspecified: Secondary | ICD-10-CM

## 2011-03-05 MED ORDER — VALACYCLOVIR HCL 1 G PO TABS: 1000.0000 mg | ORAL_TABLET | Freq: Three times a day (TID) | ORAL | Status: DC

## 2011-03-05 MED ORDER — CYCLOBENZAPRINE HCL 10 MG PO TABS: 10.0000 mg | ORAL_TABLET | Freq: Three times a day (TID) | ORAL | Status: DC | PRN

## 2011-03-05 NOTE — Patient Instructions (Signed)
Follow up as needed w/ Dr Laury Axon Take the Valtrex for the outbreak Call with any questions or concerns Hang in there!

## 2011-03-05 NOTE — Assessment & Plan Note (Signed)
Pt w/ current outbreak.  Refill provided on meds.  Pt reports outbreaks are not frequent enough to require daily suppressive meds.  Pt should not need appt for each outbreak- refills to be called in prn.  Pt expressed understanding and is in agreement w/ plan.

## 2011-03-05 NOTE — Progress Notes (Signed)
  Subjective:    Patient ID: [name], female    DOB: 1976/08/02, 35 y.o.   MRN: [id number]  HPI Herpes Outbreak- previously dx'd, current meds are expired.  Reports rarely having outbreaks.  Painful.  Typically takes 3 tabs daily x7days.  Review of Systems For ROS see HPI     Objective:   Physical Exam  Constitutional: She appears well-developed and well-nourished. No distress.  Genitourinary:       Deferred due to previous dx          Assessment & Plan:   No problem-specific assessment & plan notes found for this encounter.

## 2011-05-28 ENCOUNTER — Ambulatory Visit (INDEPENDENT_AMBULATORY_CARE_PROVIDER_SITE_OTHER): Payer: 59 | Admitting: Family Medicine

## 2011-05-28 ENCOUNTER — Encounter: Payer: Self-pay | Admitting: Family Medicine

## 2011-05-28 VITALS — BP 110/60 | HR 85 | Temp 98.9°F | Wt 130.8 lb

## 2011-05-28 DIAGNOSIS — K1370 Unspecified lesions of oral mucosa: Secondary | ICD-10-CM

## 2011-05-28 NOTE — Patient Instructions (Signed)
We will refer you to an ENT for further evaluation of the lesion in your mouth

## 2011-05-28 NOTE — Progress Notes (Signed)
  Subjective:    Patient ID: [name], female    DOB: 01-05-1976, 35 y.o.   MRN: [id number]  HPI Pt here c/o lesion under her tongue.  She noticed it only a few days ago.  Non tender. No other complaints.    Review of Systems As above    Objective:   Physical Exam  Constitutional: She appears well-developed and well-nourished.  HENT:       + white , soft, non tender area at base of frenulem          Assessment & Plan:  Lesion in mouth--- refer to ENT for possible biopsy

## 2011-07-09 ENCOUNTER — Telehealth: Payer: Self-pay | Admitting: Family Medicine

## 2011-07-09 DIAGNOSIS — M549 Dorsalgia, unspecified: Secondary | ICD-10-CM

## 2011-07-09 NOTE — Telephone Encounter (Signed)
Left message to call office to get more details about reason for referral.

## 2011-07-09 NOTE — Telephone Encounter (Signed)
Referral put in     KP 

## 2011-07-09 NOTE — Telephone Encounter (Signed)
Ok to refer to [facility] for second opinion Mri will need to be faxed

## 2011-07-09 NOTE — Telephone Encounter (Signed)
Pt state that she would like to get referred for a second opinion prior to proceed with surgery. Pt notes that we referred her to current Ortho. Pt would like to go to [facility] if they are in network. Please advise

## 2011-08-27 ENCOUNTER — Encounter: Payer: Self-pay | Admitting: Vascular Surgery

## 2011-08-28 ENCOUNTER — Encounter: Payer: 59 | Admitting: Vascular Surgery

## 2011-09-03 ENCOUNTER — Encounter: Payer: Self-pay | Admitting: Vascular Surgery

## 2011-09-04 ENCOUNTER — Encounter: Payer: Self-pay | Admitting: Vascular Surgery

## 2011-09-04 ENCOUNTER — Ambulatory Visit (INDEPENDENT_AMBULATORY_CARE_PROVIDER_SITE_OTHER): Payer: BC Managed Care – PPO | Admitting: Vascular Surgery

## 2011-09-04 VITALS — BP 120/74 | HR 87 | Resp 18 | Ht 63.0 in | Wt 129.0 lb

## 2011-09-04 DIAGNOSIS — M5020 Other cervical disc displacement, unspecified cervical region: Secondary | ICD-10-CM | POA: Insufficient documentation

## 2011-09-04 DIAGNOSIS — IMO0002 Reserved for concepts with insufficient information to code with codable children: Secondary | ICD-10-CM | POA: Insufficient documentation

## 2011-09-04 NOTE — Progress Notes (Signed)
Patient presents today for discussion of anterior exposure for lumbar disc disease surgery. She has been seen by Dr. Paula Libra is recommended anterior exposure for L5-S1 disc disease. She does not have any history of prior arterial or venous disease. No history of DVT. She does have a history of prior cesarean section years ago. She has had no other abdominal surgery.  Past Medical History  Diagnosis Date  . GERD (gastroesophageal reflux disease)   . 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 Back pain     History  Substance Use Topics  . Smoking status: Former Smoker -- 0.5 packs/day for 10 years    Types: Cigarettes  . Smokeless tobacco: Never Used  . Alcohol Use: Yes    Family History  Problem Relation Age of Onset  . Lupus Mother     Allergies  Allergen Reactions  . Ciprofloxacin   . Metoclopramide Hcl   . Reglan   . Sulfamethoxazole W/Trimethoprim     Current outpatient prescriptions:cyclobenzaprine (FLEXERIL) 10 MG tablet, Take 1 tablet (10 mg total) by mouth 3 (three) times daily as needed., Disp: 60 tablet, Rfl: 3;  meloxicam (MOBIC) 15 MG tablet, Take 15 mg by mouth daily.  , Disp: , Rfl: ;  oxycodone (OXYCONTIN) 30 MG TB12, Take 30 mg by mouth every 12 (twelve) hours.  , Disp: , Rfl:  oxyCODONE-acetaminophen (PERCOCET) 10-325 MG per tablet, Take 1 tablet by mouth as directed. 5x per day , Disp: , Rfl: ;  valACYclovir (VALTREX) 1000 MG tablet, Take 1 tablet (1,000 mg total) by mouth 3 (three) times daily., Disp: 60 tablet, Rfl: 3;  Norgestim-Eth Estrad Triphasic (ORTHO TRI-CYCLEN, 28, PO), Take 0.035 mg by mouth daily.  , Disp: , Rfl:   BP 120/74  Pulse 87  Resp 18  Ht 5\' 3"  (1.6 m)  Wt 129 lb (58.514 kg)  BMI 22.85 kg/m2  Body mass index is 22.85 kg/(m^2).       Review of systems negative except for history of present illness and past medical history  Physical exam: Well-developed well-nourished thin white female in no acute distress. HEENT normal. She has normal radial  and 2+ dorsalis pedis pulses. Abdominal exam reveals no masses she does have a low suprapubic transverse incision.  Impression and plan: L5-S1 disc disease with planned anterior exposure for this surgery. I discussed my role and exposure. I explained to mobilization of intraperitoneal contents left ureter and iliac vessels. I explained potential injury of all these. I explained this is quite uncommon. She understands and wished to proceed with surgery when the schedule by Dr. Shelle Iron

## 2011-09-11 ENCOUNTER — Telehealth: Payer: Self-pay | Admitting: *Deleted

## 2011-09-11 NOTE — Telephone Encounter (Signed)
Noted incoming doctor's line call, Octavio Graves MD advised that he wanted to speak with MD Laury Axon concerning pt [name] his daughter per MD Laury Axon has sent pt for a referral and he wants to speak with MD Laury Axon about her, noted Father MD Octavio Graves is not listed as pts contact information to be given and based on hippa we can not release any information to him, that she would have to sign a form releasing him to have her information, MD Riley Lam asked if she could call in a verbal release, I advised that she has to come in the office, please also note that the MD Octavio Graves was wrong about pt birthdate he has to think for a minute about the date and gave [birth year], however this pt DOB is [birth year], MD Lowne aware verbally about call. MD Octavio Graves stated he will have his daughter come into office to sign form of release, he understood instructions

## 2011-09-27 ENCOUNTER — Other Ambulatory Visit: Payer: Self-pay | Admitting: Otolaryngology

## 2011-10-01 ENCOUNTER — Other Ambulatory Visit: Payer: Self-pay | Admitting: *Deleted

## 2011-10-08 ENCOUNTER — Telehealth: Payer: Self-pay | Admitting: *Deleted

## 2011-10-08 NOTE — Telephone Encounter (Signed)
Received call on doctors line from pt father, stating his name is Spero Geralds, caller gave pt name and DOB as [phone] and was not sure of the exact date, caller wanted to speak with MD Laury Axon concerning pt upcoming surgery to discuss her opinion about the surgery, I advised that per [facility] regulations I can not give him any information about the pt, caller stated that he has been asking her to come by the office to sign a release for him to talk with our office concerning her care, caller asked if just speaking with MD Laury Axon would really be about his daughters healthcare? This nurse advised that this would be a violation of the pt rights and we can not discuss any information concerning the pt, the caller understood and stated " I will call her again to get her in there to get that form signed" and ended the call. MD Lowne advised verbally.

## 2011-10-17 ENCOUNTER — Telehealth: Payer: Self-pay | Admitting: *Deleted

## 2011-10-17 ENCOUNTER — Ambulatory Visit (INDEPENDENT_AMBULATORY_CARE_PROVIDER_SITE_OTHER): Payer: BC Managed Care – PPO | Admitting: Family Medicine

## 2011-10-17 ENCOUNTER — Encounter: Payer: Self-pay | Admitting: Family Medicine

## 2011-10-17 VITALS — BP 122/84 | HR 105 | Temp 100.2°F | Ht 63.0 in | Wt 130.8 lb

## 2011-10-17 DIAGNOSIS — J069 Acute upper respiratory infection, unspecified: Secondary | ICD-10-CM | POA: Insufficient documentation

## 2011-10-17 MED ORDER — GUAIFENESIN-CODEINE 100-10 MG/5ML PO SYRP: 10.0000 mL | ORAL_SOLUTION | Freq: Three times a day (TID) | ORAL | Status: AC | PRN

## 2011-10-17 MED ORDER — BENZONATATE 200 MG PO CAPS: 200.0000 mg | ORAL_CAPSULE | Freq: Three times a day (TID) | ORAL | Status: AC | PRN

## 2011-10-17 NOTE — Progress Notes (Signed)
  Subjective:    Patient ID: [name], female    DOB: 11/28/1975, 36 y.o.   MRN: [id number]  HPI Cough/fever- sxs started Sunday.  On PCN for root canal done on Friday.  Cough is wet but not productive.  Mild nasal congestion.  No ear pain.  No known sick contacts.  No N/V/D.   Review of Systems For ROS see HPI     Objective:   Physical Exam  Vitals reviewed. Constitutional: She appears well-developed and well-nourished. No distress.  HENT:  Head: Normocephalic and atraumatic.       TMs normal bilaterally Mild nasal congestion No TTP over sinuses Throat w/out erythema, edema, or exudate  Eyes: Conjunctivae and EOM are normal. Pupils are equal, round, and reactive to light.  Neck: Normal range of motion. Neck supple.  Cardiovascular: Normal rate, regular rhythm, normal heart sounds and intact distal pulses.   No murmur heard. Pulmonary/Chest: Effort normal and breath sounds normal. No respiratory distress. She has no wheezes.       + hacking cough  Lymphadenopathy:    She has no cervical adenopathy.          Assessment & Plan:

## 2011-10-17 NOTE — Patient Instructions (Signed)
This is an upper respiratory illness Continue the penicillin Drink plenty of fluids Add Mucinex to thin your congestion Use the cough pills and syrup as needed (syrup will make you drowsy) Call with any questions or concerns Hang in there!!!

## 2011-10-17 NOTE — Assessment & Plan Note (Signed)
No evidence of bacterial infxn.  Already on abx due to root canal.  Cough meds prn.  Reviewed supportive care and red flags that should prompt return.  Pt expressed understanding and is in agreement w/ plan.

## 2011-10-17 NOTE — Telephone Encounter (Signed)
[facility] Triage Call Report Triage Record Num: [id number] Operator: Chevis Pretty Patient Name: [name] Call Date & Time: 10/17/2011 8:37:26AM Patient Phone: [phone] PCP: Patient Gender: Female PCP Fax : Patient DOB: 1975/10/30 Practice Name: [facility] Reason for Call: Caller: [name]/Patient; PCP: Lelon Perla.; CB#: [phone]; ; ; Call regarding Fever X. 2. Days; Root Canal Done 10/12/11; states saw dentist again, and she believes the fever is unrelated to the procedure. Has been taking PCN since procedure. Has cough as well. Temp to 102; currently 101.4. Per protocol, emergent symptoms denied; appt sched 10/17/11 1100 with Dr. Beverely Low. Protocol(s) Used: Cough - Adult Recommended Outcome per Protocol: See Provider within 24 hours Reason for Outcome: New onset or worsening cough AND temperature up to 101.14F (38.6C) AND not responding to 12 hours of home care Care Advice: Limit or avoid exposure to irritants and allergens (e.g. air pollution, smoke/smoking, chemicals, dust, pollen, pet dander, etc.) ~ ~ Call provider if symptoms worsen or new symptoms develop. ~ Avoid any activity that produces symptoms until evaluated by provider. During pregnancy, call provider if temperature is 100 F (37.7 C) or greater OR any temperature elevation for 3 days even while taking acetaminophen. ~ Call EMS 911 if develop new onset or increasing confusion or lethargy; breathing problems continue to worsen or skin becomes bluish/gray; develops chest pain. ~ For a fever during pregnancy, take temperature every 4 hours while awake, or more often with chills/sweats, until temperature returns to normal for 24 hours. ~ ~ SYMPTOM / CONDITION MANAGEMENT ~ CAUTIONS Analgesic/Antipyretic Advice - Acetaminophen: Consider acetaminophen as directed on label or by pharmacist/provider for pain or fever PRECAUTIONS: - Use if there is no history of liver disease,  alcoholism, or intake of three or more alcohol drinks per day - Only if approved by provider during pregnancy or when breastfeeding - During pregnancy, acetaminophen should not be taken more than 3 consecutive days without telling provider - Do not exceed recommended dose or frequency ~ Analgesic/Antipyretic Advice - NSAIDs: Consider aspirin, ibuprofen, naproxen or ketoprofen for pain or fever as directed on label or by pharmacist/provider. PRECAUTIONS: - If over 59 years of age, should not take longer than 1 week without consulting provider. EXCEPTIONS: - Should not be used if taking blood thinners or have bleeding problems. - Do not use if have history of sensitivity/allergy to any of these medications; or history of cardiovascular, ulcer, kidney, liver disease or diabetes unless approved by provider. - Do not exceed recommended dose or frequency.

## 2011-10-25 ENCOUNTER — Telehealth: Payer: Self-pay | Admitting: *Deleted

## 2011-10-25 NOTE — Telephone Encounter (Signed)
Spoke to father-he just wanted reassurance that Dr Shelle Iron was the right person to do the surgery.  Pt had a second opinion in [place] with Neurosurgeon----they agreed with surgery and ant approach.

## 2011-10-25 NOTE — Telephone Encounter (Signed)
Pt father called in requesting to speak to MD Lowne directly, I advised that MD Laury Axon is seeing pt's and has a very busy schedule is there anything this nurse could help him with, pt father Diana Dom MD (noted to call on MD line) wants to ask MD Lowne about pt upcoming back surgery per pt has hx with orthopedic's over the span of a few years and he is concerned with her having the surgery per she is so young and wants to ask MD Lowne's opinion about the surgery for the pt, I advised for pt father to contact the surgeon that is to perform the surgery to see what they advise, pt father advised that he would prefer to speak with MD Laury Axon about the surgery instead, noted pt signed: DPR signed 10/18/11 [facility]) for [name] ph@ [phone], please advise

## 2011-10-30 ENCOUNTER — Encounter (HOSPITAL_COMMUNITY): Payer: Self-pay | Admitting: Pharmacy Technician

## 2011-10-31 NOTE — H&P (Signed)
Chief Complaint:  back pain  Subjective: Patient is admitted for ALF L5-S1 possible L4-5  Patient is a 36 y.o. female who has been seen by Dr. Shelle Iron for ongoing back and LE pain  They have been followed and found to have continued progressive pain.  They have been treated conservatively in the past including medications.  Despite conservative measures, they continue to have pain.  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  It is felt that they would benefit from undergoing surgical intervention.  Risks and benefits have been discussed with the patient and they elect to proceed with surgery.  The patient has no contraindications to the upcoming procedure such as ongoing infection or progressive neurological disease.  Allergies: Allergies  Allergen Reactions  . Ciprofloxacin Diarrhea and Nausea And Vomiting  . Metoclopramide Hcl Other (See Comments)    "lock jaw"  . Sulfa Antibiotics Nausea And Vomiting     Medications: Mobic (15MG  Tablet, 1 Oral daily, Taken starting 08/17/2011) Flexeril (10MG  Tablet, Oral) Active. Percocet (10-325MG  Tablet, 1 Oral four times daily, as needed, Taken starting 10/08/2011) Active. OxyCONTIN (30MG  Tablet ER 12HR, 1 Oral q 12 hrs, Taken starting 10/08/2011) Active.  Past Medical History: Past Medical History  Diagnosis Date  . GERD (gastroesophageal reflux disease)   . 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 Back pain      Past Surgical History: Past Surgical History  Procedure Date  . Cesarean section   . Colonoscopy      Family History: Family History  Problem Relation Age of Onset  . Lupus Mother     Social History: History  Substance Use Topics  . Smoking status: Former Smoker -- 0.5 packs/day for 10 years    Types: Cigarettes  . Smokeless tobacco: Never Used  . Alcohol Use: Yes     Review of Systems  General:Not Present- Chills, Fever, Night Sweats, Appetite Loss, Fatigue, Feeling sick, Weight Gain and Weight  Loss. Skin:Not Present- Itching, Rash, Skin Color Changes, Ulcer, Psoriasis and Change in Hair or Nails. HEENT:Not Present- Sensitivity to light, Hearing problems, Nose Bleed and Ringing in the Ears. Neck:Not Present- Swollen Glands and Neck Mass. Respiratory:Not Present- Snoring, Chronic Cough, Bloody sputum and Dyspnea. Cardiovascular:Not Present- Shortness of Breath, Chest Pain, Swelling of Extremities, Leg Cramps and Palpitations. Gastrointestinal:Not Present- Bloody Stool, Heartburn, Abdominal Pain, Vomiting, Nausea and Incontinence of Stool. Female Genitourinary:Not Present- Blood in Urine, Menstrual Irregularities, Frequency, Incontinence and Nocturia. Musculoskeletal:Present- Muscle Weakness, Muscle Pain and Back Pain. Not Present- Joint Stiffness, Joint Swelling and Joint Pain. Neurological:Present- Tingling. Not Present- Numbness, Burning, Tremor, Headaches and Dizziness. Psychiatric:Not Present- Anxiety, Depression and Memory Loss. Endocrine:Not Present- Cold Intolerance, Heat Intolerance, Excessive hunger and Excessive Thirst. Hematology:Not Present- Abnormal Bleeding, Anemia, Blood Clots and Easy Bruising.  Physical Exam:  Vitals: Pulse:95 Respirations:10 Blood Pressure:122/76   General appearance: alert, cooperative and no distress Head: Normocephalic, without obvious abnormality, atraumatic Neck: no adenopathy, no carotid bruit, no JVD, supple, symmetrical, trachea midline and thyroid not enlarged, symmetric, no tenderness/mass/nodules Lungs: clear to auscultation bilaterally Heart: regular rate and rhythm, S1, S2 normal, no murmur, click, rub or gallop Abdomen: soft, non-tender; bowel sounds normal; no masses,  no organomegaly Extremities: extremities normal, atraumatic, no cyanosis or edema Skin: Skin color, texture, turgor normal. No rashes or lesions Back- generalized decrease ROM, negative SLR, motor 5/5 Assessment/Plan: DDD L5-S1 The patient is being  admitted to [facility] to undergo a  ALF L5-S1 possible L4-5.  Surgery  will be performed by Dr. Jene Every.  Risks and benefits have been discussed with the patient and they elect to proceed wth the procedure.

## 2011-11-01 NOTE — Pre-Procedure Instructions (Signed)
20 [name]  11/01/2011   Your procedure is scheduled on:  Wednesday November 14, 2011  Report to [facility] at 0630 AM.  Call this number if you have problems the morning of surgery: [phone]   Remember:   Do not eat food:After Midnight.  May have clear liquids: up to 4 Hours before arrival. (up to 2:30AM)  Clear liquids include soda, tea, black coffee, apple or grape juice, broth.  Take these medicines the morning of surgery with A SIP OF WATER: OXYCODONE   Do not wear jewelry, make-up or nail polish.  Do not wear lotions, powders, or perfumes. You may wear deodorant.  Do not shave 48 hours prior to surgery.  Do not bring valuables to the hospital.  Contacts, dentures or bridgework may not be worn into surgery.  Leave suitcase in the car. After surgery it may be brought to your room.  For patients admitted to the hospital, checkout time is 11:00 AM the day of discharge.   Patients discharged the day of surgery will not be allowed to drive home.  Name and phone number of your driver: NA  Special Instructions: Incentive Spirometry - Practice and bring it with you on the day of surgery. and CHG Shower Use Special Wash: 1/2 bottle night before surgery and 1/2 bottle morning of surgery.   Please read over the following fact sheets that you were given: Pain Booklet, Coughing and Deep Breathing, Blood Transfusion Information, MRSA Information and Surgical Site Infection Prevention

## 2011-11-05 ENCOUNTER — Inpatient Hospital Stay (HOSPITAL_COMMUNITY): Admission: RE | Admit: 2011-11-05 | Discharge: 2011-11-05 | Payer: BC Managed Care – PPO | Source: Ambulatory Visit

## 2011-11-06 ENCOUNTER — Inpatient Hospital Stay (HOSPITAL_COMMUNITY): Admission: RE | Admit: 2011-11-06 | Payer: BC Managed Care – PPO | Source: Ambulatory Visit

## 2011-11-08 ENCOUNTER — Encounter (HOSPITAL_COMMUNITY)
Admission: RE | Admit: 2011-11-08 | Discharge: 2011-11-08 | Disposition: A | Discharge status: Home / Self Care | Payer: BC Managed Care – PPO | Source: Ambulatory Visit | Attending: Otolaryngology | Admitting: Otolaryngology

## 2011-11-08 ENCOUNTER — Encounter (HOSPITAL_COMMUNITY): Payer: Self-pay

## 2011-11-08 ENCOUNTER — Encounter (HOSPITAL_COMMUNITY)
Admission: RE | Admit: 2011-11-08 | Discharge: 2011-11-08 | Disposition: A | Discharge status: Home / Self Care | Payer: BC Managed Care – PPO | Source: Ambulatory Visit | Attending: Specialist | Admitting: Specialist

## 2011-11-08 LAB — URINALYSIS, ROUTINE W REFLEX MICROSCOPIC
Glucose, UA: NEGATIVE mg/dL
Leukocytes, UA: NEGATIVE
Nitrite: NEGATIVE
Specific Gravity, Urine: 1.023 (ref 1.005–1.030)
pH: 5 (ref 5.0–8.0)

## 2011-11-08 LAB — COMPREHENSIVE METABOLIC PANEL
AST: 17 U/L (ref 0–37)
Albumin: 3.2 g/dL — ABNORMAL LOW (ref 3.5–5.2)
Alkaline Phosphatase: 75 U/L (ref 39–117)
BUN: 11 mg/dL (ref 6–23)
Chloride: 102 mEq/L (ref 96–112)
Potassium: 4 mEq/L (ref 3.5–5.1)
Sodium: 137 mEq/L (ref 135–145)
Total Bilirubin: 0.1 mg/dL — ABNORMAL LOW (ref 0.3–1.2)
Total Protein: 7.2 g/dL (ref 6.0–8.3)

## 2011-11-08 LAB — DIFFERENTIAL
Eosinophils Absolute: 0.3 10*3/uL (ref 0.0–0.7)
Lymphs Abs: 1.8 10*3/uL (ref 0.7–4.0)
Monocytes Absolute: 0.8 10*3/uL (ref 0.1–1.0)
Monocytes Relative: 7 % (ref 3–12)
Neutrophils Relative %: 74 % (ref 43–77)

## 2011-11-08 LAB — CBC
HCT: 31 % — ABNORMAL LOW (ref 36.0–46.0)
MCHC: 33.2 g/dL (ref 30.0–36.0)
Platelets: 412 10*3/uL — ABNORMAL HIGH (ref 150–400)
RDW: 12.9 % (ref 11.5–15.5)
WBC: 11.2 10*3/uL — ABNORMAL HIGH (ref 4.0–10.5)

## 2011-11-08 LAB — SURGICAL PCR SCREEN: Staphylococcus aureus: NEGATIVE

## 2011-11-08 LAB — TYPE AND SCREEN: ABO/RH(D): A POS

## 2011-11-08 LAB — HCG, SERUM, QUALITATIVE: Preg, Serum: NEGATIVE

## 2011-11-08 LAB — PROTIME-INR: INR: 1.07 (ref 0.00–1.49)

## 2011-11-13 MED ORDER — CEFAZOLIN SODIUM 1-5 GM-% IV SOLN
1.0000 g | INTRAVENOUS | Status: AC
  Administered 2011-11-14: 1 g via INTRAVENOUS
  Filled 2011-11-13: qty 50

## 2011-11-14 ENCOUNTER — Encounter (HOSPITAL_COMMUNITY)
Admission: RE | Disposition: A | Discharge status: Home / Self Care | Payer: Self-pay | Source: Ambulatory Visit | Attending: Specialist

## 2011-11-14 ENCOUNTER — Ambulatory Visit (HOSPITAL_COMMUNITY): Payer: BC Managed Care – PPO

## 2011-11-14 ENCOUNTER — Encounter (HOSPITAL_COMMUNITY): Payer: Self-pay | Admitting: Certified Registered"

## 2011-11-14 ENCOUNTER — Ambulatory Visit (HOSPITAL_COMMUNITY): Payer: BC Managed Care – PPO | Admitting: Certified Registered"

## 2011-11-14 ENCOUNTER — Inpatient Hospital Stay (HOSPITAL_COMMUNITY)
Admission: RE | Admit: 2011-11-14 | Discharge: 2011-11-18 | DRG: 756 | Disposition: A | Discharge status: Home / Self Care | Payer: BC Managed Care – PPO | Source: Ambulatory Visit | Attending: Specialist | Admitting: Specialist

## 2011-11-14 DIAGNOSIS — M5137 Other intervertebral disc degeneration, lumbosacral region: Principal | ICD-10-CM | POA: Diagnosis present

## 2011-11-14 DIAGNOSIS — K219 Gastro-esophageal reflux disease without esophagitis: Secondary | ICD-10-CM | POA: Diagnosis present

## 2011-11-14 DIAGNOSIS — M51379 Other intervertebral disc degeneration, lumbosacral region without mention of lumbar back pain or lower extremity pain: Principal | ICD-10-CM | POA: Diagnosis present

## 2011-11-14 DIAGNOSIS — Z01812 Encounter for preprocedural laboratory examination: Secondary | ICD-10-CM

## 2011-11-14 DIAGNOSIS — IMO0002 Reserved for concepts with insufficient information to code with codable children: Secondary | ICD-10-CM

## 2011-11-14 DIAGNOSIS — M549 Dorsalgia, unspecified: Secondary | ICD-10-CM

## 2011-11-14 DIAGNOSIS — Z87891 Personal history of nicotine dependence: Secondary | ICD-10-CM

## 2011-11-14 HISTORY — DX: Renal tubulo-interstitial disease, unspecified: N15.9

## 2011-11-14 HISTORY — PX: ANTERIOR LUMBAR FUSION: SHX1170

## 2011-11-14 LAB — POCT I-STAT 4, (NA,K, GLUC, HGB,HCT)
Hemoglobin: 9.5 g/dL — ABNORMAL LOW (ref 12.0–15.0)
Potassium: 3.9 mEq/L (ref 3.5–5.1)
Sodium: 139 mEq/L (ref 135–145)

## 2011-11-14 SURGERY — ANTERIOR LUMBAR FUSION 2 LEVELS
Anesthesia: General | Site: Abdomen | Wound class: Clean

## 2011-11-14 MED ORDER — SODIUM CHLORIDE 0.9 % IJ SOLN: 3.0000 mL | INTRAMUSCULAR | Status: DC | PRN

## 2011-11-14 MED ORDER — ACETAMINOPHEN 10 MG/ML IV SOLN
INTRAVENOUS | Status: AC
  Filled 2011-11-14: qty 100

## 2011-11-14 MED ORDER — RIVAROXABAN 10 MG PO TABS
10.0000 mg | ORAL_TABLET | Freq: Every day | ORAL | Status: DC
  Administered 2011-11-15 – 2011-11-18 (×5): 10 mg via ORAL
  Filled 2011-11-14 (×5): qty 1

## 2011-11-14 MED ORDER — DEXAMETHASONE SODIUM PHOSPHATE 4 MG/ML IJ SOLN
INTRAMUSCULAR | Status: DC | PRN
  Administered 2011-11-14: 4 mg via INTRAVENOUS

## 2011-11-14 MED ORDER — ACETAMINOPHEN 10 MG/ML IV SOLN
INTRAVENOUS | Status: DC | PRN
  Administered 2011-11-14: 1000 mg via INTRAVENOUS

## 2011-11-14 MED ORDER — THROMBIN 20000 UNITS EX KIT
PACK | CUTANEOUS | Status: DC | PRN
  Administered 2011-11-14: 10:00:00 via TOPICAL

## 2011-11-14 MED ORDER — ACETAMINOPHEN 10 MG/ML IV SOLN
1000.0000 mg | Freq: Four times a day (QID) | INTRAVENOUS | Status: AC
  Administered 2011-11-14 – 2011-11-15 (×3): 1000 mg via INTRAVENOUS
  Filled 2011-11-14 (×3): qty 100

## 2011-11-14 MED ORDER — OXYCODONE HCL 15 MG PO TB12: 30.0000 mg | ORAL_TABLET | Freq: Two times a day (BID) | ORAL | Status: DC

## 2011-11-14 MED ORDER — OXYCODONE HCL 15 MG PO TB12
30.0000 mg | ORAL_TABLET | Freq: Two times a day (BID) | ORAL | Status: DC
  Administered 2011-11-14 – 2011-11-15 (×3): 30 mg via ORAL
  Filled 2011-11-14 (×4): qty 2

## 2011-11-14 MED ORDER — FENTANYL CITRATE 0.05 MG/ML IJ SOLN
INTRAMUSCULAR | Status: DC | PRN
  Administered 2011-11-14 (×6): 50 ug via INTRAVENOUS
  Administered 2011-11-14: 150 ug via INTRAVENOUS
  Administered 2011-11-14 (×6): 50 ug via INTRAVENOUS

## 2011-11-14 MED ORDER — LORAZEPAM BOLUS VIA INFUSION
1.0000 mg | Freq: Four times a day (QID) | INTRAVENOUS | Status: DC | PRN
  Filled 2011-11-14: qty 1

## 2011-11-14 MED ORDER — CEFAZOLIN SODIUM 1-5 GM-% IV SOLN
INTRAVENOUS | Status: AC
  Filled 2011-11-14: qty 100

## 2011-11-14 MED ORDER — PROPOFOL 10 MG/ML IV EMUL
INTRAVENOUS | Status: DC | PRN
  Administered 2011-11-14 (×2): 200 mg via INTRAVENOUS

## 2011-11-14 MED ORDER — ALBUMIN HUMAN 5 % IV SOLN
INTRAVENOUS | Status: DC | PRN
  Administered 2011-11-14: 11:00:00 via INTRAVENOUS

## 2011-11-14 MED ORDER — SODIUM CHLORIDE 0.9 % IR SOLN
Status: DC | PRN
  Administered 2011-11-14: 10:00:00

## 2011-11-14 MED ORDER — LACTATED RINGERS IV SOLN
INTRAVENOUS | Status: DC | PRN
  Administered 2011-11-14 (×3): via INTRAVENOUS

## 2011-11-14 MED ORDER — HYDROMORPHONE HCL PF 1 MG/ML IJ SOLN
INTRAMUSCULAR | Status: AC
  Filled 2011-11-14: qty 1

## 2011-11-14 MED ORDER — MORPHINE SULFATE 2 MG/ML IJ SOLN: 0.0500 mg/kg | INTRAMUSCULAR | Status: DC | PRN

## 2011-11-14 MED ORDER — ACETAMINOPHEN 10 MG/ML IV SOLN
1000.0000 mg | Freq: Four times a day (QID) | INTRAVENOUS | Status: DC
  Administered 2011-11-14: 1000 mg via INTRAVENOUS
  Filled 2011-11-14 (×3): qty 100

## 2011-11-14 MED ORDER — LORAZEPAM 2 MG/ML IJ SOLN
1.0000 mg | Freq: Four times a day (QID) | INTRAMUSCULAR | Status: DC | PRN
  Administered 2011-11-14: 1 mg via INTRAVENOUS
  Administered 2011-11-15: 19:00:00 via INTRAVENOUS
  Administered 2011-11-16 (×3): 1 mg via INTRAVENOUS
  Filled 2011-11-14 (×5): qty 1

## 2011-11-14 MED ORDER — GLYCOPYRROLATE 0.2 MG/ML IJ SOLN
INTRAMUSCULAR | Status: DC | PRN
  Administered 2011-11-14: 0.4 mg via INTRAVENOUS

## 2011-11-14 MED ORDER — CHLORHEXIDINE GLUCONATE 4 % EX LIQD: 60.0000 mL | Freq: Once | CUTANEOUS | Status: DC

## 2011-11-14 MED ORDER — DOCUSATE SODIUM 100 MG PO CAPS
100.0000 mg | ORAL_CAPSULE | Freq: Two times a day (BID) | ORAL | Status: DC
  Administered 2011-11-14 – 2011-11-18 (×8): 100 mg via ORAL
  Filled 2011-11-14 (×11): qty 1

## 2011-11-14 MED ORDER — SODIUM CHLORIDE 0.9 % IV SOLN
INTRAVENOUS | Status: DC
  Administered 2011-11-14 – 2011-11-15 (×2): via INTRAVENOUS

## 2011-11-14 MED ORDER — CEFAZOLIN SODIUM 1-5 GM-% IV SOLN
1.0000 g | Freq: Three times a day (TID) | INTRAVENOUS | Status: AC
  Administered 2011-11-14 – 2011-11-15 (×2): 1 g via INTRAVENOUS
  Filled 2011-11-14 (×2): qty 50

## 2011-11-14 MED ORDER — VALACYCLOVIR HCL 500 MG PO TABS
1000.0000 mg | ORAL_TABLET | Freq: Three times a day (TID) | ORAL | Status: DC | PRN
  Filled 2011-11-14: qty 2

## 2011-11-14 MED ORDER — HYDROMORPHONE HCL PF 1 MG/ML IJ SOLN
0.5000 mg | INTRAMUSCULAR | Status: DC | PRN
  Administered 2011-11-14 (×2): 1 mg via INTRAVENOUS
  Filled 2011-11-14 (×2): qty 1

## 2011-11-14 MED ORDER — ACETAMINOPHEN 650 MG RE SUPP: 650.0000 mg | RECTAL | Status: DC | PRN

## 2011-11-14 MED ORDER — HYDROMORPHONE HCL PF 1 MG/ML IJ SOLN
0.5000 mg | INTRAMUSCULAR | Status: DC | PRN
  Administered 2011-11-14 – 2011-11-15 (×2): 2 mg via INTRAVENOUS
  Administered 2011-11-15: 1 mg via INTRAVENOUS
  Administered 2011-11-15 (×3): 2 mg via INTRAVENOUS
  Administered 2011-11-15 (×2): 1 mg via INTRAVENOUS
  Administered 2011-11-16: 2 mg via INTRAVENOUS
  Filled 2011-11-14 (×4): qty 2
  Filled 2011-11-14 (×2): qty 1
  Filled 2011-11-14 (×3): qty 2

## 2011-11-14 MED ORDER — NEOSTIGMINE METHYLSULFATE 1 MG/ML IJ SOLN
INTRAMUSCULAR | Status: DC | PRN
  Administered 2011-11-14: 3 mg via INTRAVENOUS

## 2011-11-14 MED ORDER — VECURONIUM BROMIDE 10 MG IV SOLR
INTRAVENOUS | Status: DC | PRN
  Administered 2011-11-14 (×2): 1 mg via INTRAVENOUS

## 2011-11-14 MED ORDER — OXYCODONE HCL 5 MG PO TABS
10.0000 mg | ORAL_TABLET | ORAL | Status: DC | PRN
  Administered 2011-11-14 – 2011-11-16 (×5): 10 mg via ORAL
  Filled 2011-11-14: qty 1
  Filled 2011-11-14: qty 2
  Filled 2011-11-14: qty 1
  Filled 2011-11-14 (×3): qty 2

## 2011-11-14 MED ORDER — ACETAMINOPHEN 325 MG PO TABS
650.0000 mg | ORAL_TABLET | ORAL | Status: DC | PRN
  Administered 2011-11-16 – 2011-11-18 (×8): 650 mg via ORAL
  Filled 2011-11-14 (×8): qty 2

## 2011-11-14 MED ORDER — ONDANSETRON HCL 4 MG/2ML IJ SOLN: 4.0000 mg | INTRAMUSCULAR | Status: DC | PRN

## 2011-11-14 MED ORDER — MIDAZOLAM HCL 5 MG/5ML IJ SOLN
INTRAMUSCULAR | Status: DC | PRN
  Administered 2011-11-14 (×3): 2 mg via INTRAVENOUS

## 2011-11-14 MED ORDER — SODIUM CHLORIDE 0.9 % IV SOLN: 250.0000 mL | INTRAVENOUS | Status: DC

## 2011-11-14 MED ORDER — LIDOCAINE HCL (CARDIAC) 20 MG/ML IV SOLN
INTRAVENOUS | Status: DC | PRN
  Administered 2011-11-14: 50 mg via INTRAVENOUS

## 2011-11-14 MED ORDER — ROCURONIUM BROMIDE 100 MG/10ML IV SOLN
INTRAVENOUS | Status: DC | PRN
  Administered 2011-11-14: 50 mg via INTRAVENOUS

## 2011-11-14 MED ORDER — CYCLOBENZAPRINE HCL 10 MG PO TABS
5.0000 mg | ORAL_TABLET | Freq: Three times a day (TID) | ORAL | Status: DC | PRN
  Administered 2011-11-14 – 2011-11-15 (×2): 5 mg via ORAL
  Filled 2011-11-14 (×2): qty 1
  Filled 2011-11-14: qty 0.5
  Filled 2011-11-14 (×3): qty 1

## 2011-11-14 MED ORDER — POLYETHYLENE GLYCOL 3350 17 G PO PACK
17.0000 g | PACK | Freq: Every day | ORAL | Status: DC | PRN
  Administered 2011-11-15: 17 g via ORAL
  Filled 2011-11-14 (×3): qty 1

## 2011-11-14 MED ORDER — LACTATED RINGERS IV SOLN
INTRAVENOUS | Status: DC
  Administered 2011-11-14: 08:00:00 via INTRAVENOUS

## 2011-11-14 MED ORDER — PROPOFOL 10 MG/ML IV EMUL
INTRAVENOUS | Status: DC | PRN
  Administered 2011-11-14: 11:00:00 via INTRAVENOUS
  Administered 2011-11-14: 75 ug/kg/min via INTRAVENOUS

## 2011-11-14 MED ORDER — HYDROMORPHONE HCL PF 1 MG/ML IJ SOLN
0.2500 mg | INTRAMUSCULAR | Status: DC | PRN
  Administered 2011-11-14 (×6): 0.5 mg via INTRAVENOUS

## 2011-11-14 MED ORDER — MENTHOL 3 MG MT LOZG: 1.0000 | LOZENGE | OROMUCOSAL | Status: DC | PRN

## 2011-11-14 MED ORDER — SODIUM CHLORIDE 0.9 % IJ SOLN
3.0000 mL | Freq: Two times a day (BID) | INTRAMUSCULAR | Status: DC
  Administered 2011-11-15 – 2011-11-17 (×3): 3 mL via INTRAVENOUS

## 2011-11-14 MED ORDER — ONDANSETRON HCL 4 MG/2ML IJ SOLN
INTRAMUSCULAR | Status: DC | PRN
  Administered 2011-11-14: 4 mg via INTRAVENOUS

## 2011-11-14 MED ORDER — PHENOL 1.4 % MT LIQD: 1.0000 | OROMUCOSAL | Status: DC | PRN

## 2011-11-14 MED ORDER — ZOLPIDEM TARTRATE 10 MG PO TABS
10.0000 mg | ORAL_TABLET | Freq: Every evening | ORAL | Status: DC | PRN
  Administered 2011-11-17: 10 mg via ORAL
  Filled 2011-11-14: qty 1

## 2011-11-14 MED ORDER — HYDROMORPHONE HCL PF 1 MG/ML IJ SOLN
INTRAMUSCULAR | Status: AC
  Administered 2011-11-14: 1 mg
  Filled 2011-11-14: qty 1

## 2011-11-14 MED ORDER — HYDROMORPHONE HCL PF 1 MG/ML IJ SOLN: 0.5000 mg | INTRAMUSCULAR | Status: DC | PRN

## 2011-11-14 MED ORDER — ONDANSETRON HCL 4 MG/2ML IJ SOLN: 4.0000 mg | Freq: Once | INTRAMUSCULAR | Status: DC | PRN

## 2011-11-14 MED ORDER — BISACODYL 5 MG PO TBEC
5.0000 mg | DELAYED_RELEASE_TABLET | Freq: Every day | ORAL | Status: DC | PRN
  Administered 2011-11-14 – 2011-11-15 (×2): 5 mg via ORAL
  Filled 2011-11-14 (×2): qty 1

## 2011-11-14 MED ORDER — 0.9 % SODIUM CHLORIDE (POUR BTL) OPTIME
TOPICAL | Status: DC | PRN
  Administered 2011-11-14: 1000 mL

## 2011-11-14 SURGICAL SUPPLY — 91 items
5.2x24mm screws ×2 IMPLANT
APPLIER CLIP 11 MED OPEN (CLIP) ×2
BAG DECANTER FOR FLEXI CONT (MISCELLANEOUS) ×2 IMPLANT
BENZOIN TINCTURE PRP APPL 2/3 (GAUZE/BANDAGES/DRESSINGS) IMPLANT
BLADE SURG 10 STRL SS (BLADE) ×2 IMPLANT
BLADE SURG ROTATE 9660 (MISCELLANEOUS) IMPLANT
CAGE COUGAR ALIF MED 5 10 (Cage) ×2 IMPLANT
CHLORAPREP W/TINT 26ML (MISCELLANEOUS) ×2 IMPLANT
CLIP APPLIE 11 MED OPEN (CLIP) ×1 IMPLANT
CLIP LIGATING EXTRA MED SLVR (CLIP) ×2 IMPLANT
CLIP LIGATING EXTRA SM BLUE (MISCELLANEOUS) ×2 IMPLANT
CLOTH BEACON ORANGE TIMEOUT ST (SAFETY) ×4 IMPLANT
CLSR STERI-STRIP ANTIMIC 1/2X4 (GAUZE/BANDAGES/DRESSINGS) ×2 IMPLANT
CORDS BIPOLAR (ELECTRODE) ×2 IMPLANT
COVER MAYO STAND STRL (DRAPES) IMPLANT
COVER SURGICAL LIGHT HANDLE (MISCELLANEOUS) ×2 IMPLANT
CUBE CONFORM 17MM (Orthopedic Implant) ×4 IMPLANT
DERMABOND ADVANCED (GAUZE/BANDAGES/DRESSINGS) ×1
DERMABOND ADVANCED .7 DNX12 (GAUZE/BANDAGES/DRESSINGS) ×1 IMPLANT
DRAPE C-ARM 42X72 X-RAY (DRAPES) ×4 IMPLANT
DRAPE INCISE IOBAN 66X45 STRL (DRAPES) IMPLANT
DRAPE SURG 17X23 STRL (DRAPES) ×6 IMPLANT
DRSG MEPILEX BORDER 4X8 (GAUZE/BANDAGES/DRESSINGS) ×2 IMPLANT
ELECT BLADE 4.0 EZ CLEAN MEGAD (MISCELLANEOUS) ×2
ELECT CAUTERY BLADE 6.4 (BLADE) ×2 IMPLANT
ELECT REM PT RETURN 9FT ADLT (ELECTROSURGICAL) ×2
ELECTRODE BLDE 4.0 EZ CLN MEGD (MISCELLANEOUS) ×1 IMPLANT
ELECTRODE REM PT RTRN 9FT ADLT (ELECTROSURGICAL) ×1 IMPLANT
FIXATION PIN, NON-THREADED ×4 IMPLANT
GAUZE SPONGE 4X4 16PLY XRAY LF (GAUZE/BANDAGES/DRESSINGS) IMPLANT
GLOVE BIO SURGEON STRL SZ8.5 (GLOVE) IMPLANT
GLOVE BIOGEL PI IND STRL 6.5 (GLOVE) ×1 IMPLANT
GLOVE BIOGEL PI IND STRL 8.5 (GLOVE) IMPLANT
GLOVE BIOGEL PI INDICATOR 6.5 (GLOVE) ×1
GLOVE BIOGEL PI INDICATOR 8.5 (GLOVE)
GLOVE ECLIPSE 6.5 STRL STRAW (GLOVE) ×2 IMPLANT
GLOVE SS BIOGEL STRL SZ 7.5 (GLOVE) ×1 IMPLANT
GLOVE SUPERSENSE BIOGEL SZ 7.5 (GLOVE) ×1
GLOVE SURG SS PI 8.0 STRL IVOR (GLOVE) ×2 IMPLANT
GOWN EXTRA PROTECTION XL (GOWNS) ×4 IMPLANT
GOWN PREVENTION PLUS XLARGE (GOWN DISPOSABLE) ×2 IMPLANT
GOWN STRL NON-REIN LRG LVL3 (GOWN DISPOSABLE) ×6 IMPLANT
HEMOSTAT SURGICEL 2X14 (HEMOSTASIS) IMPLANT
IMBIBE ASPIRATION NEED IMPLANT
INSERT FOGARTY 61MM (MISCELLANEOUS) IMPLANT
INSERT FOGARTY SM (MISCELLANEOUS) IMPLANT
KIT BASIN OR (CUSTOM PROCEDURE TRAY) ×2 IMPLANT
KIT ROOM TURNOVER OR (KITS) ×4 IMPLANT
LOOP VESSEL MAXI BLUE (MISCELLANEOUS) IMPLANT
LOOP VESSEL MINI RED (MISCELLANEOUS) IMPLANT
NEEDLE ASP BONE MRW 11GX15 J (NEEDLE) ×2 IMPLANT
NEEDLE BONE MARROW 8GAX6 (NEEDLE) ×2 IMPLANT
NS IRRIG 1000ML POUR BTL (IV SOLUTION) ×2 IMPLANT
PACK LAMINECTOMY ORTHO (CUSTOM PROCEDURE TRAY) ×2 IMPLANT
PAD ARMBOARD 7.5X6 YLW CONV (MISCELLANEOUS) ×8 IMPLANT
PATTIES SURGICAL .5 X1 (DISPOSABLE) ×2 IMPLANT
SPONGE INTESTINAL PEANUT (DISPOSABLE) ×4 IMPLANT
SPONGE LAP 18X18 X RAY DECT (DISPOSABLE) IMPLANT
SPONGE LAP 4X18 X RAY DECT (DISPOSABLE) IMPLANT
SPONGE SURGIFOAM ABS GEL 100 (HEMOSTASIS) IMPLANT
STAPLER VISISTAT 35W (STAPLE) IMPLANT
STRIP CLOSURE SKIN 1/2X4 (GAUZE/BANDAGES/DRESSINGS) IMPLANT
SURGIFLO TRUKIT (HEMOSTASIS) IMPLANT
SUT MNCRL AB 4-0 PS2 18 (SUTURE) ×2 IMPLANT
SUT PROLENE 4 0 RB 1 (SUTURE) ×4
SUT PROLENE 4-0 RB1 .5 CRCL 36 (SUTURE) ×4 IMPLANT
SUT PROLENE 5 0 CC1 (SUTURE) IMPLANT
SUT PROLENE 6 0 C 1 30 (SUTURE) ×2 IMPLANT
SUT PROLENE 6 0 CC (SUTURE) IMPLANT
SUT SILK 0 TIES 10X30 (SUTURE) ×2 IMPLANT
SUT SILK 2 0 TIES 10X30 (SUTURE) ×4 IMPLANT
SUT SILK 2 0SH CR/8 30 (SUTURE) IMPLANT
SUT SILK 3 0 TIES 10X30 (SUTURE) ×4 IMPLANT
SUT SILK 3 0SH CR/8 30 (SUTURE) IMPLANT
SUT VIC AB 0 CT1 27 (SUTURE) ×2
SUT VIC AB 0 CT1 27XBRD ANBCTR (SUTURE) ×2 IMPLANT
SUT VIC AB 1 CT1 27 (SUTURE) ×2
SUT VIC AB 1 CT1 27XBRD ANBCTR (SUTURE) ×2 IMPLANT
SUT VIC AB 2-0 CT1 27 (SUTURE) ×2
SUT VIC AB 2-0 CT1 36 (SUTURE) ×4 IMPLANT
SUT VIC AB 2-0 CT1 TAPERPNT 27 (SUTURE) ×2 IMPLANT
SUT VIC AB 3-0 SH 27 (SUTURE)
SUT VIC AB 3-0 SH 27X BRD (SUTURE) IMPLANT
SUT VIC AB 3-0 X1 27 (SUTURE) ×2 IMPLANT
SYR 30ML SLIP (SYRINGE) ×2 IMPLANT
SYR BULB IRRIGATION 50ML (SYRINGE) ×4 IMPLANT
TOWEL OR 17X24 6PK STRL BLUE (TOWEL DISPOSABLE) ×4 IMPLANT
TOWEL OR 17X26 10 PK STRL BLUE (TOWEL DISPOSABLE) ×4 IMPLANT
TRAY FOLEY CATH 14FRSI W/METER (CATHETERS) ×2 IMPLANT
WATER STERILE IRR 1000ML POUR (IV SOLUTION) ×2 IMPLANT
aegis anterior lumbar plate 19mm ×2 IMPLANT

## 2011-11-14 NOTE — Anesthesia Procedure Notes (Signed)
Procedure Name: Intubation Date/Time: 11/14/2011 8:45 AM Performed by: Luster Landsberg Pre-anesthesia Checklist: Patient identified, Emergency Drugs available, Suction available and Patient being monitored Patient Re-evaluated:Patient Re-evaluated prior to inductionOxygen Delivery Method: Circle system utilized Preoxygenation: Pre-oxygenation with 100% oxygen Intubation Type: IV induction Ventilation: Mask ventilation without difficulty and Oral airway inserted - appropriate to patient size Laryngoscope Size: Mac and 3 Grade View: Grade I Tube type: Oral Tube size: 7.5 mm Number of attempts: 1 Airway Equipment and Method: Stylet Placement Confirmation: ETT inserted through vocal cords under direct vision,  breath sounds checked- equal and bilateral and positive ETCO2 Secured at: 22 cm Tube secured with: Tape Dental Injury: Teeth and Oropharynx as per pre-operative assessment

## 2011-11-14 NOTE — Anesthesia Preprocedure Evaluation (Addendum)
Anesthesia Evaluation  Patient identified by MRN, date of birth, ID band Patient awake    Reviewed: Allergy & Precautions, H&P , NPO status , Patient's Chart, lab work & pertinent test results  Airway Mallampati: I TM Distance: >3 FB Neck ROM: Full    Dental  (+) Teeth Intact and Dental Advisory Given   Pulmonary  breath sounds clear to auscultation        Cardiovascular Rhythm:Regular Rate:Normal     Neuro/Psych    GI/Hepatic   Endo/Other    Renal/GU      Musculoskeletal   Abdominal   Peds  Hematology   Anesthesia Other Findings   Reproductive/Obstetrics                           Anesthesia Physical Anesthesia Plan  ASA: II  Anesthesia Plan: General   Post-op Pain Management:    Induction: Intravenous  Airway Management Planned: Oral ETT  Additional Equipment: Arterial line  Intra-op Plan:   Post-operative Plan: Extubation in OR  Informed Consent: I have reviewed the patients History and Physical, chart, labs and discussed the procedure including the risks, benefits and alternatives for the proposed anesthesia with the patient or authorized representative who has indicated his/her understanding and acceptance.   Dental advisory given  Plan Discussed with: CRNA, Anesthesiologist and Surgeon  Anesthesia Plan Comments:        Anesthesia Quick Evaluation

## 2011-11-14 NOTE — Op Note (Signed)
OPERATIVE REPORT  DATE OF SURGERY: 11/14/2011  PATIENT: [name], 36 y.o. female MRN: [id number]  DOB: [birth year]  PRE-OPERATIVE DIAGNOSIS: Degenerative disc disease  POST-OPERATIVE DIAGNOSIS:  Same  PROCEDURE: ALIF L5-S1  SURGEON:  Gretta Began, M.D.  Co-surgeon for the exposure: Paula Libra MD  ANESTHESIA:  Gen.  EBL: 100 ml  Total I/O In: 3250 [I.V.:3000; IV Piggyback:250] Out: 710 [Urine:610; Blood:100]  BLOOD ADMINISTERED: None  DRAINS: None    COUNTS CORRECT:  YES  PLAN OF CARE: PACU   PATIENT DISPOSITION:  PACU - hemodynamically stable  PROCEDURE DETAILS: Indications for procedure: Patient is a 36 year old white female with progressively severe degenerative disc disease. Is recommended she undergo lumbar fusion with anterior approach. I discussed my roll and mobilization of the intraperitoneal contents left ureter and iliac vessels and potential injury to these. She understands and wishes to proceed  Procedure in detail: Patient was taken to the operating placed supine position where the area of the abdomen was imaged with a lateral C-arm showing the level of the L5-S1 disc. This was marked on the anterior abdominal wall. Patient was prepped and draped in usual sterile fashion. A transverse incision was made from the midline to the left several centimeters below the level of the umbilicus. The incision was continued deep through the fat and the fat was mobilized off the anterior rectus sheath. The anterior rectus sheath was opened in line with the skin incision. The rectus muscle was mobilized circumferentially. The retrogradewas entered below the level of the semilunar line. The peritoneum was mobilized to the right and the posterior rectus sheath was opened laterally. The peritoneal cavity was not entered. The left ureter was identified and was mobilized to the right as well. Blunt dissection was continued above the level of the so as muscle. The dissection was  also anterior to the iliac vessels. The L5-S1 disc was in normal location regarding the iliac vessels. The middle sacral vessels were ligated with ligaclips and divided for better mobilization. Blunt dissection over the L5-S1 disc was accomplished in the right and left of the disc space. The Brau retractor was brought onto the field. The 120 reverse lip right and left blades were positioned to either side of the disc. The malleable retractors were used for superior and inferior exposure. The remainder of the procedure were dictated separately by Dr. Romelle Starcher, M.D. 11/14/2011 3:13 PM

## 2011-11-14 NOTE — Brief Op Note (Signed)
11/14/2011  11:38 AM  PATIENT:  [name]  36 y.o. female  PRE-OPERATIVE DIAGNOSIS:  ddd, l4-s1  POST-OPERATIVE DIAGNOSIS:  ddd, l4-s1  PROCEDURE:  Procedure(s) (LRB): ANTERIOR LUMBAR FUSION 2 LEVELS (N/A) ABDOMINAL EXPOSURE (N/A)  SURGEON:  Surgeon(s) and Role: Panel 1:    * Javier Docker, MD - Primary  Panel 2:    * Larina Earthly, MD - Primary  PHYSICIAN ASSISTANT:   ASSISTANTS: strader   ANESTHESIA:   general  EBL:  Total I/O In: 2750 [I.V.:2500; IV Piggyback:250] Out: 710 [Urine:610; Blood:100]  BLOOD ADMINISTERED:none  DRAINS: none   LOCAL MEDICATIONS USED:  NONE  SPECIMEN:  No Specimen  DISPOSITION OF SPECIMEN:  N/A  COUNTS:  YES  TOURNIQUET:  * No tourniquets in log *  DICTATION: .Other Dictation: Dictation Number  [id number]  PLAN OF CARE: Admit to inpatient   PATIENT DISPOSITION:  PACU - hemodynamically stable.   Delay start of Pharmacological VTE agent (>24hrs) due to surgical blood loss or risk of bleeding: no

## 2011-11-14 NOTE — Transfer of Care (Signed)
Immediate Anesthesia Transfer of Care Note  Patient: [name]  Procedure(s) Performed: Procedure(s) (LRB): ANTERIOR LUMBAR FUSION 2 LEVELS (N/A) ABDOMINAL EXPOSURE (N/A)  Patient Location: PACU  Anesthesia Type: General  Level of Consciousness: awake, alert  and oriented  Airway & Oxygen Therapy: Patient Spontanous Breathing and Patient connected to nasal cannula oxygen  Post-op Assessment: Report given to PACU RN, Post -op Vital signs reviewed and stable and Patient moving all extremities  Post vital signs: Reviewed and stable  Complications: No apparent anesthesia complications

## 2011-11-14 NOTE — Addendum Note (Signed)
Addendum  created 11/14/11 1425 by Kerby Nora, MD   Modules edited:Orders

## 2011-11-14 NOTE — Anesthesia Postprocedure Evaluation (Signed)
  Anesthesia Post-op Note  Patient: [name]  Procedure(s) Performed: Procedure(s) (LRB): ANTERIOR LUMBAR FUSION 2 LEVELS (N/A) ABDOMINAL EXPOSURE (N/A)  Patient Location: PACU  Anesthesia Type: General  Level of Consciousness: awake, alert  and oriented  Airway and Oxygen Therapy: Patient Spontanous Breathing and Patient connected to nasal cannula oxygen  Post-op Pain: mild  Post-op Assessment: Post-op Vital signs reviewed, Patient's Cardiovascular Status Stable, Respiratory Function Stable, Patent Airway, No signs of Nausea or vomiting and Pain level controlled  Post-op Vital Signs: stable    Complications: No apparent anesthesia complications

## 2011-11-14 NOTE — Plan of Care (Signed)
Problem: Consults Goal: Diagnosis - Spinal Surgery Outcome: Completed/Met Date Met:  11/14/11 Thoraco/Lumbar Spine Fusion

## 2011-11-14 NOTE — OR Nursing (Signed)
Dr. Llana Aliment from radiology called to room 5 and reported results of AP abdomen to G. Juanetta Gosling, RN.  Results: no radiopaque items seen in abdomen.

## 2011-11-14 NOTE — Interval H&P Note (Signed)
History and Physical Interval Note:  11/14/2011 8:18 AM  [name]  has presented today for surgery, with the diagnosis of ddd, l4-s1  The various methods of treatment have been discussed with the patient and family. After consideration of risks, benefits and other options for treatment, the patient has consented to  Procedure(s) (LRB): ANTERIOR LUMBAR FUSION 2 LEVELS (N/A) ABDOMINAL EXPOSURE (N/A) as a surgical intervention .  The patients' history has been reviewed, patient examined, no change in status, stable for surgery.  I have reviewed the patients' chart and labs.  Questions were answered to the patient's satisfaction.     [name] C  Plan only L5S1. Discussed with patient incl Risks.

## 2011-11-14 NOTE — Progress Notes (Signed)
Patient reports surgical low back pain level at a 9, in tears.  Too early for any meds at this time.  Ice to back and repositioned. On-call PA notified and order received to increase Dilaudid to up to 2mg  PRN.  Continue to monitor.

## 2011-11-15 ENCOUNTER — Inpatient Hospital Stay (HOSPITAL_COMMUNITY): Payer: BC Managed Care – PPO

## 2011-11-15 DIAGNOSIS — Z09 Encounter for follow-up examination after completed treatment for conditions other than malignant neoplasm: Secondary | ICD-10-CM

## 2011-11-15 LAB — BASIC METABOLIC PANEL
Calcium: 8.2 mg/dL — ABNORMAL LOW (ref 8.4–10.5)
Creatinine, Ser: 0.65 mg/dL (ref 0.50–1.10)
GFR calc non Af Amer: >90 mL/min (ref >90)
Sodium: 136 mEq/L (ref 135–145)

## 2011-11-15 LAB — CBC
MCH: 26.9 pg (ref 26.0–34.0)
MCHC: 33 g/dL (ref 30.0–36.0)
MCV: 81.7 fL (ref 78.0–100.0)
Platelets: 236 10*3/uL (ref 150–400)
RDW: 13.1 % (ref 11.5–15.5)

## 2011-11-15 MED ORDER — SIMETHICONE 80 MG PO CHEW
160.0000 mg | CHEWABLE_TABLET | Freq: Four times a day (QID) | ORAL | Status: DC | PRN
  Administered 2011-11-15: 160 mg via ORAL
  Filled 2011-11-15 (×2): qty 2

## 2011-11-15 MED ORDER — BISACODYL 10 MG RE SUPP
10.0000 mg | Freq: Every day | RECTAL | Status: DC | PRN
  Filled 2011-11-15: qty 1

## 2011-11-15 MED ORDER — FLEET ENEMA 7-19 GM/118ML RE ENEM
1.0000 | ENEMA | Freq: Every day | RECTAL | Status: DC | PRN
  Administered 2011-11-15: 21:00:00 via RECTAL
  Filled 2011-11-15: qty 1

## 2011-11-15 NOTE — Evaluation (Signed)
Occupational Therapy Evaluation Patient Details Name: [name] MRN: [id number] DOB: [birth year] Today's Date: 11/15/2011  Problem List:  Patient Active Problem List  Diagnoses  . MOLE  . GERD  . GASTRITIS  . HEMATURIA UNSPECIFIED  . ABSENCE OF MENSTRUATION  . CELLULITIS/ABSCESS NOS  . DEGENERATIVE DISC DISEASE  . LOW BACK PAIN, CHRONIC  . BACK PAIN  . GENITAL HERPES, HX OF  . CERVICAL LYMPHADENOPATHY  . WRIST PAIN, LEFT  . Constipation  . Herpes genitalis in women  . Displacement of intervertebral disc, site unspecified, without myelopathy  . URI (upper respiratory infection)    Past Medical History:  Past Medical History  Diagnosis Date  . GERD (gastroesophageal reflux disease)   . 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 Back pain   . Hypotension     per pt.   . Recurrent upper respiratory infection (URI)     viral infection - URI- 10/2011, penicillin treatmernt   . Kidney infection     1998- hosp.- [facility]  . 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 Anxiety     h/o panic attack, due to cysts in axilla area & requiring I&D, last episode, 2011,    Past Surgical History:  Past Surgical History  Procedure Date  . Cesarean section     2007- /w epidural  anesthesia   . Colonoscopy   . Wisdom tooth extraction     young adult    OT Assessment/Plan/Recommendation OT Assessment Clinical Impression Statement: This 36 y.o. female demonstrates good understanding of back precautions and safety,  All education completed.  No further OT needs identified OT Recommendation/Assessment: Patient will need skilled OT in the acute care venue Barriers to Discharge: None OT Recommendation Follow Up Recommendations: No OT follow up;Supervision - Intermittent Equipment Recommended: None recommended by OT Individuals Consulted Consulted and Agree with Results and Recommendations: Patient OT Goals    OT Evaluation Precautions/Restrictions  Precautions Precautions: Back Precaution Booklet Issued:  Yes (comment) Precaution Comments: pt with verbal understanding Required Braces or Orthoses DO NOT USE: Yes Required Braces or Orthoses: Spinal Brace Spinal Brace: Applied in sitting position Restrictions Weight Bearing Restrictions: No Prior Functioning Home Living Lives With: Spouse Available Help at Discharge: Family;Available 24 hours/day Type of Home: House Home Access: Stairs to enter [employer] of Steps: 1 Entrance Stairs-Rails: None Home Layout: One level Bathroom Shower/Tub: Health visitor: Standard Bathroom Accessibility: Yes How Accessible: Accessible via walker Home Adaptive Equipment: Bedside commode/3-in-1;Built-in shower seat Additional Comments: parents to stay with patient at d/c Prior Function Level of Independence: Independent with assistive device(s) Able to Take Stairs?: Yes Driving: Yes Vocation: Full time employment Comments: Pt. reports difficulty with grocery shopping, etc, and oftentimes utilized a w/c for these tasks  ADL ADL Eating/Feeding: Simulated;Independent Where Assessed - Eating/Feeding: Bed level Grooming: Simulated;Wash/dry hands;Wash/dry face;Teeth care;Denture care;Supervision/safety Where Assessed - Grooming: Standing at sink Upper Body Bathing: Simulated;Set up Where Assessed - Upper Body Bathing: Supine, head of bed up Lower Body Bathing: Simulated;Set up Where Assessed - Lower Body Bathing: Sit to stand from chair;Sit to stand from bed;Supine, head of bed up;Rolling right and/or left Upper Body Dressing: Simulated;Set up Where Assessed - Upper Body Dressing: Unsupported;Sitting, chair Lower Body Dressing: Simulated;Supervision/safety Where Assessed - Lower Body Dressing: Supine, head of bed up;Rolling right and/or left;Sit to stand from chair;Sit to stand from bed Ambulation Related to ADLs: Pt. ambulated with supervision with PT and no AD ADL Comments: Pt. with complaint of  increased back pain after  x-ray.  Pt. requested not to attempt further OOB activities.  Spoke with PT, who reports pt. did very well with all aspects of mobility.  Pt. and mother were instructed in safe technique for LB ADLs.  Pt. able to state safe method for bed mobility.  All questions answered Vision/Perception    Cognition Cognition Arousal/Alertness: Awake/alert Overall Cognitive Status: Appears within functional limits for tasks assessed Orientation Level: Oriented X4 Sensation/Coordination Coordination Gross Motor Movements are Fluid and Coordinated: Yes Fine Motor Movements are Fluid and Coordinated: Yes Extremity Assessment RUE Assessment RUE Assessment: Within Functional Limits LUE Assessment LUE Assessment: Within Functional Limits Mobility    Exercises   End of Session OT - End of Session Activity Tolerance: Patient tolerated treatment well Patient left: in bed;with family/visitor present;with call bell in reach General Behavior During Session: [facility] for tasks performed Cognition: [facility] for tasks performed   [name] M 11/15/2011, 5:34 PM

## 2011-11-15 NOTE — Progress Notes (Signed)
Patient ID: [name], female   DOB: 14-Dec-1975, 36 y.o.   MRN: [id number] Patient is quite comfortable this morning she reports mild abdominal soreness and pain. Her father is with her today.  Of nontender dressing is dry and intact. She has palpable dorsalis pedis pulses bilaterally  And stable postop day 1 call if we can assist.

## 2011-11-15 NOTE — Progress Notes (Signed)
Subjective: 1 Day Post-Op Procedure(s) (LRB): ANTERIOR LUMBAR FUSION 2 LEVELS (N/A) ABDOMINAL EXPOSURE (N/A) Patient reports pain as 4 on 0-10 scale.   Denies CP or SOB.  Voiding without difficulty. Positive flatus. Objective: Vital signs in last 24 hours: Temp:  [97.2 F (36.2 C)-99.1 F (37.3 C)] 98.5 F (36.9 C) (04/11 0650) Pulse Rate:  [67-86] 86  (04/11 0650) Resp:  [7-20] 20  (04/11 0650) BP: (99-122)/(59-86) 115/80 mmHg (04/11 0650) SpO2:  [93 %-100 %] 98 % (04/11 0650) Weight:  [59.9 kg (132 lb 0.9 oz)] 59.9 kg (132 lb 0.9 oz) (04/10 1545)  Intake/Output from previous day: 04/10 0701 - 04/11 0700 In: 6381.7 [I.V.:4181.7; IV Piggyback:500] Out: 1960 [Urine:1860; Blood:100] Intake/Output this shift:     Basename 11/15/11 0650 11/14/11 0922  HGB 8.8* 9.5*    Basename 11/15/11 0650 11/14/11 0922  WBC 13.8* --  RBC 3.27* --  HCT 26.7* 28.0*  PLT 236 --    Basename 11/14/11 0922  NA 139  K 3.9  CL --  CO2 --  BUN --  CREATININE --  GLUCOSE 110*  CALCIUM --   No results found for this basename: LABPT:2,INR:2 in the last 72 hours  Neurologically intact ABD soft Intact pulses distally Dorsiflexion/Plantar flexion intact Compartment soft. No DVT  Assessment/Plan: 1 Day Post-Op Procedure(s) (LRB): ANTERIOR LUMBAR FUSION 2 LEVELS (N/A) ABDOMINAL EXPOSURE (N/A) Up with therapy D/C IV fluids Plan for discharge tomorrow. ISB. No ISB given. XRAY check. Discussed OR.  Nicolai Labonte C 11/15/2011, 7:34 AM

## 2011-11-15 NOTE — Progress Notes (Signed)
Physical Therapy Evaluation Note  Past Medical History  Diagnosis Date  . GERD (gastroesophageal reflux disease)   . 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 Back pain   . Hypotension     per pt.   . Recurrent upper respiratory infection (URI)     viral infection - URI- 10/2011, penicillin treatmernt   . Kidney infection     1998- hosp.- [facility]  . 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 Anxiety     h/o panic attack, due to cysts in axilla area & requiring I&D, last episode, 2011,     Past Surgical History  Procedure Date  . Cesarean section     2007- /w epidural  anesthesia   . Colonoscopy   . Wisdom tooth extraction     young adult     11/15/11 0830  PT Visit Information  Last PT Received On 11/15/11  Precautions  Precautions Back  Precaution Booklet Issued Yes (comment)  Precaution Comments pt with verbal understanding  Spinal Brace Applied in sitting position  Restrictions  Weight Bearing Restrictions No  Home Living  Lives With Spouse (son, 27 yo)  Type of Home House  Home Access Stairs to enter  Entrance Stairs-Number of Steps 1  Entrance Stairs-Rails None  Home Layout One level  Scientist, physiological Yes  How Accessible Accessible via walker  Home Adaptive Equipment Bedside commode/3-in-1;Built-in shower seat (access to walker)  Additional Comments parents to stay with patient at d/c  Prior Function  Able to Take Stairs? Yes  Driving Yes  Vocation Full time employment  Cognition  Arousal/Alertness Awake/alert  Overall Cognitive Status Appears within functional limits for tasks assessed  Orientation Level Oriented X4  Sensation  Light Touch Appears Intact  Bed Mobility  Bed Mobility Yes  Rolling Right 5: Supervision  Right Sidelying to Sit 5: Supervision;HOB flat  Transfers  Transfers Yes  Sit to Stand From bed;5: Supervision  Stand to Sit 5: Supervision;To chair/3-in-1  Ambulation/Gait  Ambulation/Gait Yes   Ambulation/Gait Assistance 5: Supervision  Assistive device None  Gait Pattern WFL  Gait velocity wfl  Stairs Yes  Stairs Assistance 4: Min assist (via HHA due to no hand rails at home)  Stair Management Technique No rails (R HHA)  Wheelchair Mobility  Wheelchair Mobility No  Posture/Postural Control  Posture/Postural Control (back precautions/in back brace)  Balance  Balance Assessed No  RUE Assessment  RUE Assessment WFL  LUE Assessment  LUE Assessment WFL  RLE Assessment  RLE Assessment WFL  LLE Assessment  LLE Assessment WFL  Cervical Assessment  Cervical Assessment WFL  Thoracic Assessment  Thoracic Assessment WFL  Lumbar Assessment  Lumbar Assessment (in lumbar back brace)  PT - End of Session  Equipment Utilized During Treatment Gait belt;Back brace  Activity Tolerance Patient tolerated treatment well  Patient left in chair;with call bell in reach;with family/visitor present  Nurse Communication Mobility status for transfers;Mobility status for ambulation  General  Behavior During Session [facility] for tasks performed  Cognition [facility] for tasks performed  PT Assessment  Clinical Impression Statement Pt s/p anterior lumbar fusion presenting with lumbar corset and is now on back precautions. Patient with good adherance to back precautions and familarity with technique for transferring and donning/doffing brace. Patient safe to return home with family support and recommened DME.  PT Recommendation/Assessment Patent does not need any further PT services  No Skilled PT Patient is supervision for all  activity/mobility;All education completed  PT Recommendation  Follow Up Recommendations Supervision - Intermittent (out patient PT when approved by MD.)  Equipment Recommended None recommended by PT (pt has BSC)  Prior Function  Level of Independence Independent with basic ADLs;Independent with gait;Independent with transfers  Vocation Requirements 1/2 up on feet, 1/2 at desk      Pain: 4/10 surgical incision pain at abdomen. Denies LE pain or numbness/tingling  Lewis Shock, PT, DPT Pager #: [phone] Office #: [phone]

## 2011-11-15 NOTE — Progress Notes (Signed)
UR COMPLETED  

## 2011-11-15 NOTE — Progress Notes (Signed)
Pt bed alarm is currently not setting. Pt educated on the importance of calling if they need assistance.

## 2011-11-15 NOTE — Op Note (Signed)
[name], [name] NO.:  [id number]  MEDICAL RECORD NO.:  [id number]  LOCATION:  5031                         FACILITY:  [facility]  PHYSICIAN:  Jene Every, M.D.    DATE OF BIRTH:  19-Jan-1976  DATE OF PROCEDURE: DATE OF DISCHARGE:                              OPERATIVE REPORT   PREOPERATIVE DIAGNOSIS:  End-stage disk degeneration, L5-S1 with foraminal stenosis of L5.  POSTOPERATIVE DIAGNOSIS:  End-stage disk degeneration, L5-S1 with foraminal stenosis of L5.  PROCEDURE PERFORMED:  Anterior lumbar interbody fusion utilizing a DePuy Cougar cage, measured 8 x 10 mm, supplemented by DePuy AEGIS plate 19 mm and four, 5.2, 24-mm screws.  Utilization of autologous and allograft bone graft, conformed cubes MTF and bone marrow aspirate from the L5 vertebral body.  ANESTHESIA:  General.  ASSISTANT:  Roma Schanz, P.A.  HISTORY:  This is a 36 year old female with end-stage disk degeneration of L5-S1 and neuroforaminal stenosis and positional radiculopathy had been refractory to conservative treatment, requiring significant narcotic analgesics to maintain a functional wife.  Due to her persistent pain, she was indicated for anterior lumbar interbody fusion at L5-S1 being collapsed to the L5-S1 with associated neural foraminal stenosis.  She did have disk degeneration at L4-L5, but no stenosis instability or tearing.  Initially, evaluated for a 2 level fusion; however, we felt that the findings of L5-1 which was the predominant worse symptoms including the stenosis and discogenic pain with limited perioperative morbidities.  We did however discuss the possibility of XLIF L4-5 fusion further on down the line if that progresses and degenerates as the L5-1 did.  Risks and benefits discussed including bleeding, infection, damage, fracture, DVT, PE, anesthetic complications, etc.  The anterior exposure was provided by Dr. Gretta Began.  TECHNIQUE:  The patient in  supine position.  After induction of adequate general anesthesia and 1 g of Kefzol, she was placed supine on the [place] table.  All bony prominences well padded.  Foley to gravity. Neural monitoring was utilized throughout the case as well as pulse oximetry in the left lower extremity.  The abdomen was prepped and draped in usual sterile fashion.  Under x-ray, we localized the transverse abdominal incision for an L5-S1 anterior approach.  This was performed and incision was made through the transverse abdominal fascia and provided a retroperitoneal approach to the anterior L5-S1 disk space.  After self-retaining retractors were placed, we confirmed the disk space at L5-S1 by centering the AP with a marker and on the lateral position.  First, we proceeded with aspiration of the bone marrow aspirate of the L5 vertebral body with a Jamshidi, we impacted into the L5 vertebral body approximately 15 mm and aspirated 10 cc of bone marrow aspirate and blood.  We then sealed the donor site with bone wax.  This was then saved and mixed with our conformed cubes and preserved for insertion later.  Next, we turned our attention towards the disk space. We protected the soft tissues at all times.  The vascular structures, psoas, etc.  I removed the anterior osteophytes with a Leksell rongeur. Then incised the anulus in the width that would accept a medium cage.  I then performed a  full diskectomy with a pituitary and then sequentially distracted the disk space utilizing paddle distractors from an 8 mm to a 10 mm.  We curetted the end-plates of all cartilaginous end-plate and preserved the bony end-plate.  Good bleeding tissue was recognized. This was performed anterior-posterior with the posterior margin visualized by x-ray from the lateral with a curved curette over the back of the vertebral body L5 releasing the posterior anulus to allow for distraction.  It was done meticulously medial to lateral.   We used __________ Gelfoam for a small epidural bleeder, which was coagulated. There was no CSF leakage appreciated throughout the case.  After sequentially distracting the disk space very gently, there was no EMG changes or changes in the pulse oximetry.  I then used a trial medium 10 mm anterior 8 mm posterior Cougar plate cages and felt 12 mm would be excessive distraction and also placed an additional pressure on L4-5 disk.  We trialed the medium 10 and it is satisfactory in the AP and lateral plane.  Then copiously irrigated the disk space with antibiotic irrigation.  Inspection revealed no CSF leakage or active bleeding.  We then saturated the conformed cubes, bone graft with the bone marrow aspirate, and placed them in the medium Cougar cage and inserted it with a __________ self-inserted with gentle distraction and insertion.  This inserted without difficulty and he required minimal further camping to rest posteriorly.  Just prior to this, we confirmed parallel distraction of the 4 and 5 end-plates without divergence.  After insertion of the cage in the AP and lateral plane, it was found to be satisfactory in the midline.  We then secured and supplemented this fixation with an anterior plate, measured at 19 off the retrieval uncinate process.  We preliminarily fixed it with provisional screws, pins and marking that divergence and inserted our screws in a curvilinear fashion in line with plate contour.  I used an awl and fixed 2 proximal over the L5 vertebral body and 2 distal over the S1 vertebral body inserted the 24 mm 5.2 screws with excellent purchase.  They were then secondarily secured with a Cam fixation.  Just prior to that, with the insertion device on the cage, medial, lateral, cephalad, and caudad attempted immobilization revealed no motion of the cage and we felt it was well fixed and it was the appropriate size.  Next, after insertion of screws in the AP and lateral  plane, we confirmed the placement.  One screw was just above the end-plate and multiple x-ray views, reconfirmed that it was not penetrating the endplate.  Then copiously irrigated the wound once again.  Inspection revealed no evidence of active bleeding.  There was no evidence of injury to the ureter either.  After removing the retractors in the AP and lateral plane, obtained final radiographs and as inspecting the musculature in the approach, no active bleeding was noted.  [facility] and indicated no abnormal activity.  Blood loss was approximately 100.  With the Deaver protecting the abdominal contents were repaired the transverse abdominal fashion with #1 Vicryl interrupted figure-of- 8 sutures, two separates converging and interlocking.  The subcu with 0 and 2-0 Vicryl simple sutures.  The skin was reapproximated with a subcuticular suture.  Wound reinforced with Steri-Strips.  Sterile dressing applied.  X-rays taken indicated no retained hardware, instruments, or patties.  She was then placed on the hospital bed, extubated without difficulty, and transported to recovery in satisfactory condition.  Good pulses distally.  The patient tolerated procedure well.  No complications.  BLOOD LOSS:  100 cc.     Jene Every, M.D.     Cordelia Pen  D:  11/14/2011  T:  11/15/2011  Job:  [id number]

## 2011-11-15 NOTE — Progress Notes (Signed)
VASCULAR LAB PRELIMINARY  PRELIMINARY  PRELIMINARY  PRELIMINARY  Bilateral lower extremity Dopplers completed.    Preliminary report:  There is no DVT or SVT noted in the bilateral lower extremities.  Sherren Kerns Irene, 11/15/2011, 11:35 AM

## 2011-11-16 ENCOUNTER — Encounter (HOSPITAL_COMMUNITY): Payer: Self-pay | Admitting: Specialist

## 2011-11-16 LAB — BASIC METABOLIC PANEL
BUN: 6 mg/dL (ref 6–23)
Creatinine, Ser: 0.67 mg/dL (ref 0.50–1.10)
GFR calc Af Amer: >90 mL/min (ref >90)
GFR calc non Af Amer: >90 mL/min (ref >90)

## 2011-11-16 LAB — CBC
HCT: 27.4 % — ABNORMAL LOW (ref 36.0–46.0)
Hemoglobin: 9.2 g/dL — ABNORMAL LOW (ref 12.0–15.0)
MCH: 27.5 pg (ref 26.0–34.0)
MCHC: 33.6 g/dL (ref 30.0–36.0)
RDW: 13 % (ref 11.5–15.5)

## 2011-11-16 LAB — DIFFERENTIAL
Basophils Absolute: 0 10*3/uL (ref 0.0–0.1)
Basophils Relative: 0 % (ref 0–1)
Eosinophils Absolute: 0.1 10*3/uL (ref 0.0–0.7)
Monocytes Absolute: 1 10*3/uL (ref 0.1–1.0)
Monocytes Relative: 8 % (ref 3–12)
Neutrophils Relative %: 73 % (ref 43–77)

## 2011-11-16 MED ORDER — POTASSIUM CHLORIDE CRYS ER 20 MEQ PO TBCR
20.0000 meq | EXTENDED_RELEASE_TABLET | Freq: Two times a day (BID) | ORAL | Status: DC
  Administered 2011-11-16 – 2011-11-18 (×5): 20 meq via ORAL
  Filled 2011-11-16 (×8): qty 1

## 2011-11-16 MED ORDER — OXYCODONE HCL 15 MG PO TABS: 15.0000 mg | ORAL_TABLET | ORAL | Status: DC | PRN

## 2011-11-16 MED ORDER — DSS 100 MG PO CAPS: 100.0000 mg | ORAL_CAPSULE | Freq: Two times a day (BID) | ORAL | Status: AC

## 2011-11-16 MED ORDER — OXYCODONE HCL 5 MG PO TABS
15.0000 mg | ORAL_TABLET | ORAL | Status: DC | PRN
  Administered 2011-11-16: 20 mg via ORAL
  Administered 2011-11-16: 10 mg via ORAL
  Administered 2011-11-16 – 2011-11-18 (×10): 20 mg via ORAL
  Filled 2011-11-16 (×3): qty 4
  Filled 2011-11-16: qty 1
  Filled 2011-11-16: qty 2
  Filled 2011-11-16: qty 3
  Filled 2011-11-16 (×2): qty 4
  Filled 2011-11-16: qty 2
  Filled 2011-11-16: qty 4
  Filled 2011-11-16: qty 1
  Filled 2011-11-16 (×4): qty 4
  Filled 2011-11-16: qty 2

## 2011-11-16 MED ORDER — OXYCODONE HCL 40 MG PO TB12: 40.0000 mg | ORAL_TABLET | Freq: Two times a day (BID) | ORAL | Status: DC

## 2011-11-16 MED ORDER — CYCLOBENZAPRINE HCL 10 MG PO TABS
10.0000 mg | ORAL_TABLET | Freq: Three times a day (TID) | ORAL | Status: DC | PRN
  Administered 2011-11-16 – 2011-11-18 (×7): 10 mg via ORAL
  Filled 2011-11-16 (×7): qty 1

## 2011-11-16 MED ORDER — OXYCODONE HCL 40 MG PO TB12
40.0000 mg | ORAL_TABLET | Freq: Two times a day (BID) | ORAL | Status: DC
  Administered 2011-11-16 (×2): 40 mg via ORAL
  Filled 2011-11-16 (×2): qty 1

## 2011-11-16 MED ORDER — CYCLOBENZAPRINE HCL 10 MG PO TABS: 10.0000 mg | ORAL_TABLET | Freq: Three times a day (TID) | ORAL | Status: AC | PRN

## 2011-11-16 MED FILL — Sodium Chloride IV Soln 0.9%: INTRAVENOUS | Qty: 1000 | Status: AC

## 2011-11-16 MED FILL — Heparin Sodium (Porcine) Inj 1000 Unit/ML: INTRAMUSCULAR | Qty: 30 | Status: AC

## 2011-11-16 NOTE — Progress Notes (Signed)
PAIN MANAGEMENTCONSULT NOTE - INITIAL   Pharmacy Consult for Help with pain control Indication: Chronic back pain and post op pain.  Allergies  Allergen Reactions  . Ciprofloxacin Diarrhea and Nausea And Vomiting  . Metoclopramide Hcl Other (See Comments)    "lock jaw"  . Sulfa Antibiotics Nausea And Vomiting    Patient Measurements: Height: 5' 2.99" (160 cm) Weight: 132 lb 0.9 oz (59.9 kg) IBW/kg (Calculated) : 52.38    Vital Signs: Temp: 98.8 F (37.1 C) (04/12 1300) Temp src: Oral (04/12 1300) BP: 109/70 mmHg (04/12 1300) Pulse Rate: 113  (04/12 1300) Intake/Output from previous day: 04/11 0701 - 04/12 0700 In: 1030 [P.O.:1030] Out: 4 [Urine:3; Stool:1] Intake/Output from this shift:    Labs:  Basename 11/16/11 0911 11/15/11 0650 11/14/11 0922  WBC 12.2* 13.8* --  HGB 9.2* 8.8* 9.5*  HCT 27.4* 26.7* 28.0*  PLT 225 236 --  APTT -- -- --  CREATININE 0.67 0.65 --  LABCREA -- -- --  CREATININE 0.67 0.65 --  CREAT24HRUR -- -- --  MG -- -- --  PHOS -- -- --  ALBUMIN -- -- --  PROT -- -- --  ALBUMIN -- -- --  AST -- -- --  ALT -- -- --  ALKPHOS -- -- --  BILITOT -- -- --  BILIDIR -- -- --  IBILI -- -- --   Estimated Creatinine Clearance: 81.2 ml/min (by C-G formula based on Cr of 0.67).   Microbiology: Recent Results (from the past 720 hour(s))  SURGICAL PCR SCREEN     Status: Normal   Collection Time   11/08/11  9:12 AM      Component Value Range Status Comment   MRSA, PCR NEGATIVE  NEGATIVE  Final    Staphylococcus aureus NEGATIVE  NEGATIVE  Final     Medical History: Past Medical History  Diagnosis Date  . GERD (gastroesophageal reflux disease)   . 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 Back pain   . Hypotension     per pt.   . Recurrent upper respiratory infection (URI)     viral infection - URI- 10/2011, penicillin treatmernt   . Kidney infection     1998- hosp.- [facility]  . 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 Anxiety     h/o panic attack, due to cysts in  axilla area & requiring I&D, last episode, 2011,     Assessment: 36 y.o. Female POD#2 s/p anterior lumbar fusion. PTA on Oxycontin 30mg  po q12h, Percocet 10-325mg  1tablet q6hr prn pain for chronic back pain. Post op patient has received IV APAP 1000mg  IV q6hprn,  IV Dilaudid 1-2mg  q2-3 hours (11/15/11), Flexeril 5mg  po TID prn,  Oxycontin 30mg  po q12h and OxyIR 10mg  po q3h prn.  Pain score =8 (0-10) @0200 .  I spoke to the patient's nurse this AM about patient's pain.  At that time patient was asleep. Pain medications had just been adjusted/increased this AM but new/ higher dosages had not been given yet. Decided to wait to see how the new dosages effect her pain control.  Oxycontin increased to 40mg  q12h, OxyIR increased to 15-20mg  q3hprn, flexeril increased to 10mg  and IV Dilaudid discontiuned. Suggested giving oral APAP as patient was taking Percocet 10-325 prior to admission.  RN later reports pain score down to 5 (0-10) and patient appears more comfortable after medications new dosages given this morning.   Goal of Therapy:  Effective pain control  Plan:  Since pain medications have  been appropriately increased and pain score is down, I do not have any recommendations at this time.  Would continue these medications today and re-evaluate her pain control tomorrow.  If pain is not controlled well, consider oral dilaudid 1-2mg  q3hprn.   Arman Filter 11/16/2011,3:59 PM

## 2011-11-16 NOTE — Progress Notes (Signed)
CARE MANAGEMENT NOTE 11/16/2011  Patient:  [name], [name]   Account Number:  [id number]  Date Initiated:  11/16/2011  Documentation initiated by:  Vance Peper  Subjective/Objective Assessment:   36 yr old female s/p anterior lumbar fusion     Action/Plan:   No home health needs identified   Anticipated DC Date:  11/17/2011   Anticipated DC Plan:  HOME/SELF CARE      DC Planning Services  CM consult      PAC Choice  NA   Choice offered to / List presented to:  NA   DME arranged  NA      DME agency  NA     HH arranged  NA      HH agency  NA   Status of service:  Completed, signed off Medicare Important Message given?   (If response is "NO", the following Medicare IM given date fields will be blank) Date Medicare IM given:   Date Additional Medicare IM given:    Discharge Disposition:  HOME/SELF CARE  Per UR Regulation:    If discussed at Long Length of Stay Meetings, dates discussed:    Comments:

## 2011-11-16 NOTE — Progress Notes (Signed)
Subjective: 2 Days Post-Op Procedure(s) (LRB): ANTERIOR LUMBAR FUSION 2 LEVELS (N/A) ABDOMINAL EXPOSURE (N/A) Patient reports pain as 4 on 0-10 scale.   Denies CP or SOB.  Voiding without difficulty. Positive flatus. Objective: Vital signs in last 24 hours: Temp:  [98.2 F (36.8 C)-99.6 F (37.6 C)] 99.6 F (37.6 C) (04/11 2154) Pulse Rate:  [86-113] 113  (04/11 2154) Resp:  [16-20] 18  (04/11 2154) BP: (115-132)/(69-86) 126/69 mmHg (04/11 2154) SpO2:  [98 %-100 %] 100 % (04/11 2154)  Intake/Output from previous day: 04/11 0701 - 04/12 0700 In: 1030 [P.O.:1030] Out: 4 [Urine:3; Stool:1] Intake/Output this shift: Total I/O In: -  Out: 2 [Urine:1; Stool:1]   Basename 11/15/11 0650 11/14/11 0922  HGB 8.8* 9.5*    Basename 11/15/11 0650 11/14/11 0922  WBC 13.8* --  RBC 3.27* --  HCT 26.7* 28.0*  PLT 236 --    Basename 11/15/11 0650 11/14/11 0922  NA 136 139  K 3.3* 3.9  CL 104 --  CO2 26 --  BUN 7 --  CREATININE 0.65 --  GLUCOSE 97 110*  CALCIUM 8.2* --   No results found for this basename: LABPT:2,INR:2 in the last 72 hours  Neurologically intact ABD soft Intact pulses distally Dorsiflexion/Plantar flexion intact Incision: dressing C/D/I and no drainage  Assessment/Plan: 2 Days Post-Op Procedure(s) (LRB): ANTERIOR LUMBAR FUSION 2 LEVELS (N/A) ABDOMINAL EXPOSURE (N/A) D/C IV fluids Continue foley due to dc foley [name] C 11/16/2011, 6:41 AM

## 2011-11-17 LAB — CBC
MCHC: 32.2 g/dL (ref 30.0–36.0)
Platelets: 262 10*3/uL (ref 150–400)
RDW: 13 % (ref 11.5–15.5)
WBC: 8 10*3/uL (ref 4.0–10.5)

## 2011-11-17 LAB — BASIC METABOLIC PANEL
GFR calc Af Amer: >90 mL/min (ref >90)
GFR calc non Af Amer: >90 mL/min (ref >90)
Potassium: 4.2 mEq/L (ref 3.5–5.1)
Sodium: 138 mEq/L (ref 135–145)

## 2011-11-17 MED ORDER — OXYCODONE HCL 40 MG PO TB12
50.0000 mg | ORAL_TABLET | Freq: Two times a day (BID) | ORAL | Status: DC
  Administered 2011-11-17 – 2011-11-18 (×3): 50 mg via ORAL
  Filled 2011-11-17 (×3): qty 1

## 2011-11-17 NOTE — Progress Notes (Signed)
Pt was emotional and crying earlier in shift complaining about pain meds not working for her. Worked with pt during the shift and had her take all her prn meds on regular schedule. She was much less emotional and slept well during the night. Stated pain level was at 4.

## 2011-11-17 NOTE — Progress Notes (Signed)
Pt request to call MD to change pain meds - requests po Dilaudid - call returned ;  to see pt. Soon.

## 2011-11-17 NOTE — Progress Notes (Signed)
Subjective: Procedure(s) (LRB): ANTERIOR LUMBAR FUSION 2 LEVELS (N/A) ABDOMINAL EXPOSURE (N/A) 3 Days Post-Op  Patient reports pain as 8 on 0-10 scale.  Reports none leg pain reports incisional back pain   Positive void Negative bowel movement Positive flatus Negative chest pain or shortness of breath  Objective: Vital signs in last 24 hours: Temp:  [98.4 F (36.9 C)-98.8 F (37.1 C)] 98.4 F (36.9 C) (04/13 0600) Pulse Rate:  [82-113] 82  (04/13 0600) Resp:  [18-19] 18  (04/13 0600) BP: (93-109)/(62-70) 93/62 mmHg (04/13 0600) SpO2:  [97 %-100 %] 97 % (04/13 0600)  Intake/Output from previous day: 04/12 0701 - 04/13 0700 In: 400 [P.O.:400] Out: 1 [Urine:1]   Basename 11/17/11 0613 11/16/11 0911  WBC 8.0 12.2*  RBC 3.54* 3.34*  HCT 29.2* 27.4*  PLT 262 225    Basename 11/17/11 0613 11/16/11 0911  NA 138 136  K 4.2 3.1*  CL 102 98  CO2 27 29  BUN 9 6  CREATININE 0.69 0.67  GLUCOSE 102* 134*  CALCIUM 9.0 8.3*   No results found for this basename: LABPT:2,INR:2 in the last 72 hours  Neurologically intact ABD soft Sensation intact distally Intact pulses distally Dorsiflexion/Plantar flexion intact Incision: dressing C/D/I Compartment soft  Assessment/Plan: Patient stable  Adjust pain meds today Increase OxyContin to 50mg  from 40mg ; continue oxycodone for breakthrough pain Continue mobilization with physical therapy Continue care  Possible dc tomorrow if pain controlled Dr. Shon Baton spoke to and evaluated the patient today and agrees with the plan.   Gwinda Maine 11/17/2011, 10:52 AM

## 2011-11-18 LAB — CBC
Hemoglobin: 10 g/dL — ABNORMAL LOW (ref 12.0–15.0)
MCHC: 32.2 g/dL (ref 30.0–36.0)
RBC: 3.79 MIL/uL — ABNORMAL LOW (ref 3.87–5.11)
WBC: 8.5 10*3/uL (ref 4.0–10.5)

## 2011-11-18 LAB — BASIC METABOLIC PANEL
CO2: 28 mEq/L (ref 19–32)
Chloride: 95 mEq/L — ABNORMAL LOW (ref 96–112)
GFR calc non Af Amer: >90 mL/min (ref >90)
Glucose, Bld: 96 mg/dL (ref 70–99)
Potassium: 4.6 mEq/L (ref 3.5–5.1)
Sodium: 133 mEq/L — ABNORMAL LOW (ref 135–145)

## 2011-11-18 MED ORDER — OXYCODONE HCL 10 MG PO TB12: 50.0000 mg | ORAL_TABLET | Freq: Two times a day (BID) | ORAL | Status: AC

## 2011-11-18 MED ORDER — RIVAROXABAN 10 MG PO TABS: 10.0000 mg | ORAL_TABLET | Freq: Every day | ORAL | Status: DC

## 2011-11-18 MED ORDER — LORAZEPAM 1 MG PO TABS: 1.0000 mg | ORAL_TABLET | Freq: Three times a day (TID) | ORAL | Status: DC | PRN

## 2011-11-18 MED ORDER — OXYCODONE HCL 15 MG PO TABS: 15.0000 mg | ORAL_TABLET | ORAL | Status: AC | PRN

## 2011-11-18 MED ORDER — ZOLPIDEM TARTRATE ER 6.25 MG PO TBCR: 6.2500 mg | EXTENDED_RELEASE_TABLET | Freq: Every evening | ORAL | Status: DC | PRN

## 2011-11-18 MED ORDER — OXYCODONE HCL 40 MG PO TB12: 40.0000 mg | ORAL_TABLET | Freq: Two times a day (BID) | ORAL | Status: AC

## 2011-11-18 MED ORDER — LORAZEPAM 1 MG PO TABS: 1.0000 mg | ORAL_TABLET | Freq: Three times a day (TID) | ORAL | Status: AC | PRN

## 2011-11-18 NOTE — Progress Notes (Signed)
Pt to call office re: Outpt PT in am per Dr Charlann Boxer.

## 2011-11-18 NOTE — Progress Notes (Signed)
Patient ID: [name], female   DOB: [birth year], 36 y.o.   MRN: [id number] Subjective: 4 Days Post-Op Procedure(s) (LRB): ANTERIOR LUMBAR FUSION 2 LEVELS (N/A) ABDOMINAL EXPOSURE (N/A)    Patient reports pain as moderate. But tolerable.  Ready to go home.  Has had bowel movement  Objective:   VITALS:   Filed Vitals:   11/18/11 0655  BP: 100/61  Pulse: 106  Temp: 99.7 F (37.6 C)  Resp: 18    Neurovascular intact Incision: dressing C/D/I  LABS  Basename 11/18/11 0500 11/17/11 0613 11/16/11 0911  HGB 10.0* 9.4* 9.2*  HCT 31.1* 29.2* 27.4*  WBC 8.5 8.0 12.2*  PLT 323 262 225     Basename 11/17/11 0613 11/16/11 0911  NA 138 136  K 4.2 3.1*  BUN 9 6  CREATININE 0.69 0.67  GLUCOSE 102* 134*    No results found for this basename: LABPT:2,INR:2 in the last 72 hours   Assessment/Plan: 4 Days Post-Op Procedure(s) (LRB): ANTERIOR LUMBAR FUSION 2 LEVELS (N/A) ABDOMINAL EXPOSURE (N/A)   Discharge home with home health Follow up with Dr. Shelle Iron as reviewed

## 2011-11-19 NOTE — Discharge Summary (Signed)
Patient ID: [name] MRN: [id number] DOB/AGE: 1975-11-14 35 y.o.  Admit date: 11/14/2011 Discharge date: 11/19/2011  Admission Diagnoses:  DDD L5-S1 Past Medical History  Diagnosis Date  . GERD (gastroesophageal reflux disease)   . 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 Back pain   . Hypotension     per pt.   . Recurrent upper respiratory infection (URI)     viral infection - URI- 10/2011, penicillin treatmernt   . Kidney infection     1998- hosp.- [facility]  . 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 Anxiety     h/o panic attack, due to cysts in axilla area & requiring I&D, last episode, 2011,    Discharge Diagnoses:  Same s/p ALF L5-S1   Surgeries: Procedure(s): ANTERIOR LUMBAR FUSION 1 LEVELS ABDOMINAL EXPOSURE on 11/14/2011   Consultants:  PT/OT, pharmacy  Discharged Condition: Improved  Hospital Course: [name] is an 36 y.o. female who was admitted 11/14/2011 for operative treatment of DDD L5-S1. Patient has severe unremitting pain that affects sleep, daily activities, and work/hobbies. After pre-op clearance the patient was taken to the operating room on 11/14/2011 and underwent  Procedure(s): ANTERIOR LUMBAR FUSION 1 LEVELS ABDOMINAL EXPOSURE.    Patient was given perioperative antibiotics: Anti-infectives     Start     Dose/Rate Route Frequency Ordered Stop   11/14/11 1700   ceFAZolin (ANCEF) IVPB 1 g/50 mL premix        1 g 100 mL/hr over 30 Minutes Intravenous Every 8 hours 11/14/11 1542 11/15/11 0127   11/14/11 1542   valACYclovir (VALTREX) tablet 1,000 mg  Status:  Discontinued        1,000 mg Oral 3 times daily PRN 11/14/11 1542 11/18/11 1554   11/14/11 1015   polymyxin B 500,000 Units, bacitracin 50,000 Units in sodium chloride irrigation 0.9 % 500 mL irrigation  Status:  Discontinued          As needed 11/14/11 1015 11/14/11 1217   11/13/11 1420   ceFAZolin (ANCEF) IVPB 1 g/50 mL premix        1 g 100 mL/hr over 30 Minutes Intravenous 60 min pre-op 11/13/11 1420  11/14/11 0850           Patient was given sequential compression devices, early ambulation, and chemoprophylaxis to prevent DVT.  Patient benefited maximally from hospital stay.   Hospital stay was extended secondary to pain control issues otherwise the patient remained stable.    Recent vital signs: No data found.    Recent laboratory studies:  Basename 11/18/11 0500 12/17/2011 0613  WBC 8.5 8.0  HGB 10.0* 9.4*  HCT 31.1* 29.2*  PLT 323 262  NA 133* 138  K 4.6 4.2  CL 95* 102  CO2 28 27  BUN 9 9  CREATININE 0.69 0.69  GLUCOSE 96 102*  INR -- --  CALCIUM 9.2 --     Discharge Medications:   Medication List  As of 11/19/2011 12:51 PM   STOP taking these medications         meloxicam 15 MG tablet      PERCOCET 10-325 MG per tablet         TAKE these medications         cyclobenzaprine 10 MG tablet   Commonly known as: FLEXERIL   Take 1 tablet (10 mg total) by mouth 3 (three) times daily as needed for muscle spasms.      DSS 100 MG Caps  Take 100 mg by mouth 2 (two) times daily.      LORazepam 1 MG tablet   Commonly known as: ATIVAN   Take 1 tablet (1 mg total) by mouth every 8 (eight) hours as needed for anxiety.      oxyCODONE 10 MG 12 hr tablet   Commonly known as: OXYCONTIN   Take 5 tablets (50 mg total) by mouth every 12 (twelve) hours.      oxyCODONE 40 MG 12 hr tablet   Commonly known as: OXYCONTIN   Take 1 tablet (40 mg total) by mouth every 12 (twelve) hours.      oxyCODONE 15 MG immediate release tablet   Commonly known as: ROXICODONE   Take 1-1.5 tablets (15-22.5 mg total) by mouth every 3 (three) hours as needed.      rivaroxaban 10 MG Tabs tablet   Commonly known as: XARELTO   Take 1 tablet (10 mg total) by mouth daily before breakfast.      valACYclovir 1000 MG tablet   Commonly known as: VALTREX   Take 1,000 mg by mouth 3 (three) times daily as needed. For flare ups      zolpidem 6.25 MG CR tablet   Commonly known as: AMBIEN CR    Take 1 tablet (6.25 mg total) by mouth at bedtime as needed for sleep.            Diagnostic Studies: Dg Lumbar Spine 2-3 Views  11/15/2011  *RADIOLOGY REPORT*  Clinical Data: Anterior fusion  LUMBAR SPINE - 2-3 VIEW  Comparison:   the previous day's study  Findings: Changes of anterior instrumented fusion L5-S1.  Hardware projects in expected location, intact.  Graft markers project in the interspace.  Alignment is preserved.  Remainder of the lumbar spine unremarkable.  IMPRESSION:  1.  Changes of anterior interbody fusion L5-S1 without apparent complication.  Original Report Authenticated By: Osa Craver, M.D.   Dg Lumbar Spine 2-3 Views  11/14/2011  *RADIOLOGY REPORT*  Clinical Data: 36 year old female undergoing lumbar surgery.  LUMBAR SPINE - 2-3 VIEW  Comparison: Preoperative radiographs 11/08/2011 and earlier.  Fluoroscopy time of 1.5 minutes was utilized.  Findings: Two intraoperative fluoroscopic views of the lumbosacral junction demonstrate placement of anterior interbody and end plate fusion hardware at L5-S1.  IMPRESSION: Anterior interbody fusion depicted at L5-S1.  Original Report Authenticated By: Harley Hallmark, M.D.   Dg Lumbar Spine 2-3 Views  11/08/2011  *RADIOLOGY REPORT*  Clinical Data: Degenerative disc disease, preop  LUMBAR SPINE - 2-3 VIEW  Comparison: 03/15/2008  Findings: Levels were numbered according to the previous study. There is mild narrowing L4-5.  There is moderate narrowing of the L5-S1 interspace with discogenic sclerosis in the adjacent vertebral bodies.  Normal alignment.  Negative for fracture or other acute abnormality.  IMPRESSION:  1.  Degenerative disc disease L4-5 and L5-S1.  Original Report Authenticated By: Osa Craver, M.D.   Dg Or Local Abdomen  11/14/2011  *RADIOLOGY REPORT*  Clinical Data: Status post lumbar ACDF.  Incorrect surgical needle count.  OR LOCAL ABDOMEN  Comparison: Intraoperative radiograph 11/14/2011.  Findings:  Fixation hardware is noted at the level of L5-S1, and an interbody graft is in place at this level.  Multiple adjacent surgical clips are noted.  No definite unexpected radiopaque foreign body is identified to suggest the presence of a retained surgical needle in the visualized portions of the abdomen or pelvis.  IMPRESSION: 1.  Postoperative changes of ACDF at L5-S1,  without retained needle.  These results were called by telephone on 11/14/2011  at  12:05 p.m. to  Nurse Gwendolyn in the OR, who verbally acknowledged these results.  Original Report Authenticated By: Florencia Reasons, M.D.    Disposition: 01-Home or Self Care  Discharge Orders    Future Orders Please Complete By Expires   Diet - low sodium heart healthy      Call MD / Call 911      Comments:   If you experience chest pain or shortness of breath, CALL 911 and be transported to the hospital emergency room.  If you develope a fever above 101 F, pus (white drainage) or increased drainage or redness at the wound, or calf pain, call your surgeon's office.   Constipation Prevention      Comments:   Drink plenty of fluids.  Prune juice may be helpful.  You may use a stool softener, such as Colace (over the counter) 100 mg twice a day.  Use MiraLax (over the counter) for constipation as needed.   Increase activity slowly as tolerated      Weight Bearing as taught in Physical Therapy      Comments:   Use a walker or crutches as instructed.   Discharge instructions      Comments:   Walk As Tolerated utilizing back precautions.  No bending, twisting, or lifting.  No driving for 2 weeks.  Ok to shower in 72 hours.  Use good body mechanics. Change dressing daily.      Follow-up Information    Follow up with BEANE,JEFFREY C, MD in 14 days.   Contact information:   [facility] [address], [address] [place] [address] [phone]           Signed: Liam Graham. 11/19/2011, 12:51  PM

## 2012-02-11 ENCOUNTER — Telehealth: Payer: Self-pay | Admitting: Family Medicine

## 2012-02-11 NOTE — Telephone Encounter (Signed)
Please advise      KP 

## 2012-02-11 NOTE — Telephone Encounter (Signed)
Can she come in tomorrow?  

## 2012-02-11 NOTE — Telephone Encounter (Signed)
msg left to call the office     KP 

## 2012-02-11 NOTE — Telephone Encounter (Signed)
Caller: Oretta/Patient; PCP: Lelon Perla.; CB#: [phone];  Call regarding Vaginal Bleeding, Headache. Tapering Off  Oxycontin;;  PMP 02/02/12; Light vag bleeding resumed 02/09/12.  Denies pain or cramping. Not sexually active in past 4 weeks. Home care advice given for all other situations per Abnormal Premenopausal Vaginal Bleeding Guideline.

## 2012-02-12 NOTE — Telephone Encounter (Signed)
msg left to call the office     KP 

## 2012-03-18 ENCOUNTER — Ambulatory Visit: Payer: BC Managed Care – PPO | Admitting: Family Medicine

## 2012-05-12 ENCOUNTER — Telehealth: Payer: Self-pay | Admitting: Family Medicine

## 2012-05-12 MED ORDER — VALACYCLOVIR HCL 1 G PO TABS: 1000.0000 mg | ORAL_TABLET | Freq: Three times a day (TID) | ORAL | Status: DC | PRN

## 2012-05-12 NOTE — Telephone Encounter (Signed)
Refill: Valacyclovir hcl 1 gram tablet. Take 1 tablet three times a day. Qty 60. Last fill 7.30.12

## 2012-08-29 ENCOUNTER — Ambulatory Visit: Payer: BC Managed Care – PPO

## 2013-03-02 ENCOUNTER — Ambulatory Visit (INDEPENDENT_AMBULATORY_CARE_PROVIDER_SITE_OTHER): Payer: Managed Care, Other (non HMO) | Admitting: Family Medicine

## 2013-03-02 ENCOUNTER — Encounter: Payer: Self-pay | Admitting: Family Medicine

## 2013-03-02 VITALS — BP 110/80 | HR 84 | Temp 98.5°F | Wt 147.6 lb

## 2013-03-02 DIAGNOSIS — F900 Attention-deficit hyperactivity disorder, predominantly inattentive type: Secondary | ICD-10-CM | POA: Insufficient documentation

## 2013-03-02 DIAGNOSIS — F988 Other specified behavioral and emotional disorders with onset usually occurring in childhood and adolescence: Secondary | ICD-10-CM | POA: Insufficient documentation

## 2013-03-02 MED ORDER — METHYLPHENIDATE HCL ER (OSM) 18 MG PO TBCR: 18.0000 mg | EXTENDED_RELEASE_TABLET | ORAL | Status: DC

## 2013-03-02 NOTE — Patient Instructions (Addendum)
Follow up w/ Dr Laury Axon in 1 month Start the Concerta daily (a long acting form of Ritalin) Call with any questions or concerns Hang in there!!

## 2013-03-02 NOTE — Progress Notes (Signed)
  Subjective:    Patient ID: [name], female    DOB: Apr 02, 1976, 37 y.o.   MRN: [id number]  HPI Pt was dx'd w/ ADD and learning disabilities in HS and was started on meds and took meds throughout college and HS.  Has recently changed jobs and reports it's very high stress and demanding.  Is having a hard time w/ focus and concentration.  Felt it was time to re-visit the ADD issue.  Previous medication was Ritalin.  Pt reports only side effect was dry mouth- denies palpitations, insomnia, anorexia.  **when drug screen was mentioned, pt reports she took a 1/2 tab of mom's xanax over the weekend**   Review of Systems For ROS see HPI     Objective:   Physical Exam  Vitals reviewed. Constitutional: She is oriented to person, place, and time. She appears well-developed and well-nourished. No distress.  Neck: Neck supple. No thyromegaly present.  Cardiovascular: Normal rate, regular rhythm, normal heart sounds and intact distal pulses.   Pulmonary/Chest: Effort normal and breath sounds normal. No respiratory distress. She has no wheezes. She has no rales.  Lymphadenopathy:    She has no cervical adenopathy.  Neurological: She is alert and oriented to person, place, and time.  Psychiatric: She has a normal mood and affect. Her behavior is normal. Thought content normal.          Assessment & Plan:

## 2013-03-02 NOTE — Assessment & Plan Note (Signed)
New to provider.  Ongoing for pt.  Will restart meds but this will need close f/u w/ PCP- particularly since pt mentioned she has taken family member's controlled substances.  Pt did sign agreement and submit to UDS.  Will defer to PCP for titration of dose and f/u of sxs control.

## 2013-03-10 ENCOUNTER — Telehealth: Payer: Self-pay | Admitting: Family Medicine

## 2013-03-10 NOTE — Telephone Encounter (Signed)
Msg left to call the office     KP 

## 2013-03-10 NOTE — Telephone Encounter (Signed)
Patient Information:  Caller Name: Lekisha  Phone: [phone]  Patient: Diana, Kelly  Gender: Female  DOB: 01/18/1976  Age: 37 Years  PCP: Sheliah Hatch  Pregnant: No  Office Follow Up:  Does the office need to follow up with this patient?: Yes  Instructions For The Office: Medication concerns. Please follow up  RN Note:  Patient has a follow up appointment but it is not until 03/20/13... Please contact patient for assistance with medication  Symptoms  Reason For Call & Symptoms: Patient states she was given Methylphenidate (Conserta) 18 mg once daily. Rx on 03/02/13.  She has not noticied any changes at all.  What is the next step?  different medication, dosage change.  She had been on Ritalin in college and it had worked.  Please update?  Reviewed Health History In EMR: Yes  Reviewed Medications In EMR: Yes  Reviewed Allergies In EMR: Yes  Reviewed Surgeries / Procedures: Yes  Date of Onset of Symptoms: 03/02/2013  Treatments Tried: Conserta  Treatments Tried Worked: No OB / GYN:  LMP: 02/25/2013  Guideline(s) Used:  No Protocol Available - Information Only  Disposition Per Guideline:   Discuss with PCP and Callback by Nurse Today  Reason For Disposition Reached:   Nursing judgment  Advice Given:  Call Back If:  New symptoms develop  You become worse.  RN Overrode Recommendation:  Patient Requests Prescription  Patient has medication concerns. Please follow up

## 2013-03-10 NOTE — Telephone Encounter (Signed)
msg left to call the office     KP 

## 2013-03-10 NOTE — Telephone Encounter (Signed)
Caller: [name]/Patient; Phone: [phone]; (alternate number [phone] Work) Reason for Call: Missed call from K.  Payne at 14: 20.  Please call her back.  Thank you.

## 2013-03-11 NOTE — Telephone Encounter (Signed)
Caller: Mychele/Patient; Phone: [phone]; Reason for Call: Returning Kim's phone call from 03/10/13.   Please call home number or if unavailable call work number.

## 2013-03-11 NOTE — Telephone Encounter (Signed)
Caller: [name]/Patient; Phone: [phone] - please call work number if unavailable at home; Reason for Call: Pt has been awaiting a phone call back from [place], CMA since this morning and hasn't heard anything.  Please call back as soon as possible.

## 2013-03-11 NOTE — Telephone Encounter (Signed)
Spoke with patient and she stated she took Ritalin and it worked for her int he past, she said she is at a high stress job now and would need something that works better than Concerta. She is willing to wait until tomorrow for Dr.Lowne to advise.      KP

## 2013-03-12 ENCOUNTER — Telehealth: Payer: Self-pay | Admitting: Family Medicine

## 2013-03-12 NOTE — Telephone Encounter (Signed)
Another thing to consider is the dose of concerta is very low.  That may be why it is not working.  We can change but she would have to bring concerta to office--- she may want to wait until ov.  She should have appointment this month.

## 2013-03-12 NOTE — Telephone Encounter (Signed)
Caller: [name]/Patient; Phone: [phone]; Reason for Call: Called back regarding message left 03/11/13 regarding Concerta not working because she has not heard back yet.  Provided with Dr Ernst Spell 03/12/13 response: "Another thing to consider is the dose of concerta is very low.  That may be why it is not working.  We can change but she would have to bring concerta to office--- she may want to wait until ov.  She should have appointment this month."   Jasemine reports she is not taking the Concerta now since it did not work so she threw it away.  Asking what to do now between today and appointment on 03/20/13.  Would like to be considered for sooner appointment if there is a cancellation.  Please follow up.

## 2013-03-12 NOTE — Telephone Encounter (Signed)
Apt scheduled for 03/16/13.     KP

## 2013-03-16 ENCOUNTER — Encounter: Payer: Self-pay | Admitting: Family Medicine

## 2013-03-16 ENCOUNTER — Ambulatory Visit (INDEPENDENT_AMBULATORY_CARE_PROVIDER_SITE_OTHER): Payer: Managed Care, Other (non HMO) | Admitting: Family Medicine

## 2013-03-16 VITALS — BP 110/70 | HR 90 | Temp 98.4°F | Wt 144.6 lb

## 2013-03-16 DIAGNOSIS — F988 Other specified behavioral and emotional disorders with onset usually occurring in childhood and adolescence: Secondary | ICD-10-CM

## 2013-03-16 MED ORDER — METHYLPHENIDATE HCL ER (OSM) 36 MG PO TBCR: 36.0000 mg | EXTENDED_RELEASE_TABLET | ORAL | Status: DC

## 2013-03-16 NOTE — Assessment & Plan Note (Signed)
Increase concerta to 36 mg daily rto 1 month or sooner prn

## 2013-03-16 NOTE — Progress Notes (Signed)
  Subjective:    Patient ID: [name], female    DOB: 1976-07-24, 37 y.o.   MRN: [id number]  HPI Pt here f/u ADD.  The concerta 18 mg is not working at all.   She would like to consider changing med or inc dose.     Review of Systems As above    Objective:   Physical Exam  BP 110/70  Pulse 90  Temp(Src) 98.4 F (36.9 C) (Oral)  Wt 144 lb 9.6 oz (65.59 kg)  BMI 25.62 kg/m2  SpO2 98%  LMP 02/20/2013 General appearance: alert, cooperative, appears stated age and no distress Throat: lips, mucosa, and tongue normal; teeth and gums normal Neck: no adenopathy, supple, symmetrical, trachea midline and thyroid not enlarged, symmetric, no tenderness/mass/nodules Lungs: clear to auscultation bilaterally Heart: S1, S2 normal Extremities: extremities normal, atraumatic, no cyanosis or edema      Assessment & Plan:

## 2013-03-16 NOTE — Patient Instructions (Addendum)
Attention Deficit Hyperactivity Disorder Attention deficit hyperactivity disorder (ADHD) is a problem with behavior issues based on the way the brain functions (neurobehavioral disorder). It is a common reason for behavior and academic problems in school. CAUSES  The cause of ADHD is unknown in most cases. It may run in families. It sometimes can be associated with learning disabilities and other behavioral problems. SYMPTOMS  There are 3 types of ADHD. The 3 types and some of the symptoms include:  Inattentive  Gets bored or distracted easily.  Loses or forgets things. Forgets to hand in homework.  Has trouble organizing or completing tasks.  Difficulty staying on task.  An inability to organize daily tasks and school work.  Leaving projects, chores, or homework unfinished.  Trouble paying attention or responding to details. Careless mistakes.  Difficulty following directions. Often seems like is not listening.  Dislikes activities that require sustained attention (like chores or homework).  Hyperactive-impulsive  Feels like it is impossible to sit still or stay in a seat. Fidgeting with hands and feet.  Trouble waiting turn.  Talking too much or out of turn. Interruptive.  Speaks or acts impulsively.  Aggressive, disruptive behavior.  Constantly busy or on the go, noisy.  Combined  Has symptoms of both of the above. Often children with ADHD feel discouraged about themselves and with school. They often perform well below their abilities in school. These symptoms can cause problems in home, school, and in relationships with peers. As children get older, the excess motor activities can calm down, but the problems with paying attention and staying organized persist. Most children do not outgrow ADHD but with good treatment can learn to cope with the symptoms. DIAGNOSIS  When ADHD is suspected, the diagnosis should be made by professionals trained in ADHD.  Diagnosis will  include:  Ruling out other reasons for the child's behavior.  The caregivers will check with the child's school and check their medical records.  They will talk to teachers and parents.  Behavior rating scales for the child will be filled out by those dealing with the child on a daily basis. A diagnosis is made only after all information has been considered. TREATMENT  Treatment usually includes behavioral treatment often along with medicines. It may include stimulant medicines. The stimulant medicines decrease impulsivity and hyperactivity and increase attention. Other medicines used include antidepressants and certain blood pressure medicines. Most experts agree that treatment for ADHD should address all aspects of the child's functioning. Treatment should not be limited to the use of medicines alone. Treatment should include structured classroom management. The parents must receive education to address rewarding good behavior, discipline, and limit-setting. Tutoring or behavioral therapy or both should be available for the child. If untreated, the disorder can have long-term serious effects into adolescence and adulthood. HOME CARE INSTRUCTIONS   Often with ADHD there is a lot of frustration among the family in dealing with the illness. There is often blame and anger that is not warranted. This is a life long illness. There is no way to prevent ADHD. In many cases, because the problem affects the family as a whole, the entire family may need help. A therapist can help the family find better ways to handle the disruptive behaviors and promote change. If the child is young, most of the therapist's work is with the parents. Parents will learn techniques for coping with and improving their child's behavior. Sometimes only the child with the ADHD needs counseling. Your caregivers can help   you make these decisions.  Children with ADHD may need help in organizing. Some helpful tips include:  Keep  routines the same every day from wake-up time to bedtime. Schedule everything. This includes homework and playtime. This should include outdoor and indoor recreation. Keep the schedule on the refrigerator or a bulletin board where it is frequently seen. Mark schedule changes as far in advance as possible.  Have a place for everything and keep everything in its place. This includes clothing, backpacks, and school supplies.  Encourage writing down assignments and bringing home needed books.  Offer your child a well-balanced diet. Breakfast is especially important for school performance. Children should avoid drinks with caffeine including:  Soft drinks.  Coffee.  Tea.  However, some older children (adolescents) may find these drinks helpful in improving their attention.  Children with ADHD need consistent rules that they can understand and follow. If rules are followed, give small rewards. Children with ADHD often receive, and expect, criticism. Look for good behavior and praise it. Set realistic goals. Give clear instructions. Look for activities that can foster success and self-esteem. Make time for pleasant activities with your child. Give lots of affection.  Parents are their children's greatest advocates. Learn as much as possible about ADHD. This helps you become a stronger and better advocate for your child. It also helps you educate your child's teachers and instructors if they feel inadequate in these areas. Parent support groups are often helpful. A national group with local chapters is called CHADD (Children and Adults with Attention Deficit Hyperactivity Disorder). PROGNOSIS  There is no cure for ADHD. Children with the disorder seldom outgrow it. Many find adaptive ways to accommodate the ADHD as they mature. SEEK MEDICAL CARE IF:  Your child has repeated muscle twitches, cough or speech outbursts.  Your child has sleep problems.  Your child has a marked loss of  appetite.  Your child develops depression.  Your child has new or worsening behavioral problems.  Your child develops dizziness.  Your child has a racing heart.  Your child has stomach pains.  Your child develops headaches. Document Released: 07/13/2002 Document Revised: 10/15/2011 Document Reviewed: 02/23/2008 Exit[employer] Patient Information 2014 [employer].  

## 2013-03-20 ENCOUNTER — Ambulatory Visit: Payer: Managed Care, Other (non HMO) | Admitting: Family Medicine

## 2013-03-25 ENCOUNTER — Encounter: Payer: Self-pay | Admitting: Family Medicine

## 2013-04-02 ENCOUNTER — Ambulatory Visit: Payer: Managed Care, Other (non HMO) | Admitting: Family Medicine

## 2013-04-14 ENCOUNTER — Encounter: Payer: Self-pay | Admitting: Family Medicine

## 2013-04-14 ENCOUNTER — Ambulatory Visit (INDEPENDENT_AMBULATORY_CARE_PROVIDER_SITE_OTHER): Payer: Managed Care, Other (non HMO) | Admitting: Family Medicine

## 2013-04-14 VITALS — BP 112/68 | HR 85 | Temp 98.6°F | Wt 144.4 lb

## 2013-04-14 DIAGNOSIS — F988 Other specified behavioral and emotional disorders with onset usually occurring in childhood and adolescence: Secondary | ICD-10-CM

## 2013-04-14 MED ORDER — METHYLPHENIDATE HCL ER (LA) 20 MG PO CP24: 20.0000 mg | ORAL_CAPSULE | ORAL | Status: DC

## 2013-04-14 NOTE — Assessment & Plan Note (Signed)
D/c concerta Start Ritalin la  20 mg 1 po qd  rto 6 months or sooner prn

## 2013-04-14 NOTE — Patient Instructions (Addendum)
Attention Deficit Hyperactivity Disorder Attention deficit hyperactivity disorder (ADHD) is a problem with behavior issues based on the way the brain functions (neurobehavioral disorder). It is a common reason for behavior and academic problems in school. CAUSES  The cause of ADHD is unknown in most cases. It may run in families. It sometimes can be associated with learning disabilities and other behavioral problems. SYMPTOMS  There are 3 types of ADHD. The 3 types and some of the symptoms include:  Inattentive  Gets bored or distracted easily.  Loses or forgets things. Forgets to hand in homework.  Has trouble organizing or completing tasks.  Difficulty staying on task.  An inability to organize daily tasks and school work.  Leaving projects, chores, or homework unfinished.  Trouble paying attention or responding to details. Careless mistakes.  Difficulty following directions. Often seems like is not listening.  Dislikes activities that require sustained attention (like chores or homework).  Hyperactive-impulsive  Feels like it is impossible to sit still or stay in a seat. Fidgeting with hands and feet.  Trouble waiting turn.  Talking too much or out of turn. Interruptive.  Speaks or acts impulsively.  Aggressive, disruptive behavior.  Constantly busy or on the go, noisy.  Combined  Has symptoms of both of the above. Often children with ADHD feel discouraged about themselves and with school. They often perform well below their abilities in school. These symptoms can cause problems in home, school, and in relationships with peers. As children get older, the excess motor activities can calm down, but the problems with paying attention and staying organized persist. Most children do not outgrow ADHD but with good treatment can learn to cope with the symptoms. DIAGNOSIS  When ADHD is suspected, the diagnosis should be made by professionals trained in ADHD.  Diagnosis will  include:  Ruling out other reasons for the child's behavior.  The caregivers will check with the child's school and check their medical records.  They will talk to teachers and parents.  Behavior rating scales for the child will be filled out by those dealing with the child on a daily basis. A diagnosis is made only after all information has been considered. TREATMENT  Treatment usually includes behavioral treatment often along with medicines. It may include stimulant medicines. The stimulant medicines decrease impulsivity and hyperactivity and increase attention. Other medicines used include antidepressants and certain blood pressure medicines. Most experts agree that treatment for ADHD should address all aspects of the child's functioning. Treatment should not be limited to the use of medicines alone. Treatment should include structured classroom management. The parents must receive education to address rewarding good behavior, discipline, and limit-setting. Tutoring or behavioral therapy or both should be available for the child. If untreated, the disorder can have long-term serious effects into adolescence and adulthood. HOME CARE INSTRUCTIONS   Often with ADHD there is a lot of frustration among the family in dealing with the illness. There is often blame and anger that is not warranted. This is a life long illness. There is no way to prevent ADHD. In many cases, because the problem affects the family as a whole, the entire family may need help. A therapist can help the family find better ways to handle the disruptive behaviors and promote change. If the child is young, most of the therapist's work is with the parents. Parents will learn techniques for coping with and improving their child's behavior. Sometimes only the child with the ADHD needs counseling. Your caregivers can help   you make these decisions.  Children with ADHD may need help in organizing. Some helpful tips include:  Keep  routines the same every day from wake-up time to bedtime. Schedule everything. This includes homework and playtime. This should include outdoor and indoor recreation. Keep the schedule on the refrigerator or a bulletin board where it is frequently seen. Mark schedule changes as far in advance as possible.  Have a place for everything and keep everything in its place. This includes clothing, backpacks, and school supplies.  Encourage writing down assignments and bringing home needed books.  Offer your child a well-balanced diet. Breakfast is especially important for school performance. Children should avoid drinks with caffeine including:  Soft drinks.  Coffee.  Tea.  However, some older children (adolescents) may find these drinks helpful in improving their attention.  Children with ADHD need consistent rules that they can understand and follow. If rules are followed, give small rewards. Children with ADHD often receive, and expect, criticism. Look for good behavior and praise it. Set realistic goals. Give clear instructions. Look for activities that can foster success and self-esteem. Make time for pleasant activities with your child. Give lots of affection.  Parents are their children's greatest advocates. Learn as much as possible about ADHD. This helps you become a stronger and better advocate for your child. It also helps you educate your child's teachers and instructors if they feel inadequate in these areas. Parent support groups are often helpful. A national group with local chapters is called CHADD (Children and Adults with Attention Deficit Hyperactivity Disorder). PROGNOSIS  There is no cure for ADHD. Children with the disorder seldom outgrow it. Many find adaptive ways to accommodate the ADHD as they mature. SEEK MEDICAL CARE IF:  Your child has repeated muscle twitches, cough or speech outbursts.  Your child has sleep problems.  Your child has a marked loss of  appetite.  Your child develops depression.  Your child has new or worsening behavioral problems.  Your child develops dizziness.  Your child has a racing heart.  Your child has stomach pains.  Your child develops headaches. Document Released: 07/13/2002 Document Revised: 10/15/2011 Document Reviewed: 02/23/2008 Exit[employer] Patient Information 2014 [employer].  

## 2013-04-14 NOTE — Progress Notes (Signed)
  Subjective:    Patient ID: [name], female    DOB: Apr 14, 1976, 37 y.o.   MRN: [id number]  HPI Pt here to f/u ADD -- she states the concertta is  Not effective.  She would like to change meds. No other complaints.   Review of Systems    as above Objective:   Physical Exam   BP 112/68  Pulse 85  Temp(Src) 98.6 F (37 C) (Oral)  Wt 144 lb 6.4 oz (65.499 kg)  BMI 25.59 kg/m2  SpO2 99% General appearance: alert, cooperative, appears stated age and no distress Throat: lips, mucosa, and tongue normal; teeth and gums normal Neck: no adenopathy, no carotid bruit, no JVD, supple, symmetrical, trachea midline and thyroid not enlarged, symmetric, no tenderness/mass/nodules Lungs: clear to auscultation bilaterally Heart: S1, S2 normal      Assessment & Plan:

## 2013-04-27 ENCOUNTER — Telehealth: Payer: Self-pay | Admitting: General Practice

## 2013-04-27 MED ORDER — METHYLPHENIDATE HCL ER (LA) 30 MG PO CP24: 30.0000 mg | ORAL_CAPSULE | ORAL | Status: DC

## 2013-04-27 NOTE — Telephone Encounter (Signed)
Med filled for 30mg  #30 with 0 refills.

## 2013-04-27 NOTE — Telephone Encounter (Signed)
Pt called stating that her Ritalin LA is working but she does not feel as though it is strong enough. Pt stated that Lowne and her have been going back and forth to find a medication that would work. Please advise.

## 2013-04-27 NOTE — Telephone Encounter (Signed)
Ok to change to 30 mg qd #30

## 2013-05-19 ENCOUNTER — Telehealth: Payer: Self-pay | Admitting: *Deleted

## 2013-05-19 ENCOUNTER — Other Ambulatory Visit: Payer: Self-pay | Admitting: General Practice

## 2013-05-19 MED ORDER — METHYLPHENIDATE HCL ER (LA) 40 MG PO CP24: 40.0000 mg | ORAL_CAPSULE | ORAL | Status: DC

## 2013-05-19 NOTE — Telephone Encounter (Signed)
Medication called and stated that the Ritalin 30mg  is working better than the 20mg  but she still seems to not be focusing and often finds her self starring out into space. Wolld like to know if there is a higher dose or would you like to switch her to something else, Please advise SW

## 2013-05-19 NOTE — Telephone Encounter (Signed)
Can increase to 40 mg Ritalin La #30  ---bring 30 mg back

## 2013-05-20 NOTE — Telephone Encounter (Signed)
Pt is going to finish the 30mg   and the fill the new Rx.

## 2013-06-24 ENCOUNTER — Telehealth: Payer: Self-pay | Admitting: *Deleted

## 2013-06-24 NOTE — Telephone Encounter (Signed)
Patient is requesting refill for Ritalin.  Last seen-04/14/2013  Last filled-05/19/2013  UDS-03/02/2013 low risk, contract  Please advise. SW

## 2013-06-24 NOTE — Telephone Encounter (Signed)
Ok to refill as long as she has been here within 6 months

## 2013-06-25 ENCOUNTER — Other Ambulatory Visit: Payer: Self-pay | Admitting: Obstetrics and Gynecology

## 2013-06-25 MED ORDER — METHYLPHENIDATE HCL ER (LA) 40 MG PO CP24: 40.0000 mg | ORAL_CAPSULE | ORAL | Status: DC

## 2013-06-25 NOTE — Telephone Encounter (Signed)
Medication filled and patient notified

## 2013-07-22 ENCOUNTER — Telehealth: Payer: Self-pay | Admitting: *Deleted

## 2013-07-22 MED ORDER — METHYLPHENIDATE HCL ER (LA) 40 MG PO CP24: 40.0000 mg | ORAL_CAPSULE | ORAL | Status: DC

## 2013-07-22 NOTE — Telephone Encounter (Signed)
Patient called and stated that she would like a refill of her Ritalin. Patient would like to know if she is able to get 3 posted dated prescriptions.   Last seen-04/14/2013  Last filled-06/25/2013  UDS-03/02/2013 low risk, contract signed   Please advise. SW

## 2013-07-22 NOTE — Telephone Encounter (Signed)
Ok to give 3 prescriptions

## 2013-07-22 NOTE — Telephone Encounter (Signed)
Patient aware Rx will be ready for pick up tomorrow.      KP 

## 2013-09-07 ENCOUNTER — Other Ambulatory Visit: Payer: Self-pay | Admitting: *Deleted

## 2013-09-07 MED ORDER — VALACYCLOVIR HCL 1 G PO TABS: 1000.0000 mg | ORAL_TABLET | Freq: Three times a day (TID) | ORAL | Status: DC | PRN

## 2013-10-20 ENCOUNTER — Telehealth: Payer: Self-pay | Admitting: *Deleted

## 2013-10-20 MED ORDER — METHYLPHENIDATE HCL ER (LA) 40 MG PO CP24: 40.0000 mg | ORAL_CAPSULE | ORAL | Status: DC

## 2013-10-20 NOTE — Telephone Encounter (Signed)
Patient aware Rx ready for pick up and apt scheduled     KP

## 2013-10-20 NOTE — Telephone Encounter (Signed)
Refill x1 -- pt due for ov and uds

## 2013-10-20 NOTE — Telephone Encounter (Signed)
methylphenidate (RITALIN LA) 40 MG 24 hr capsule Last refill: 07/22/2014 #30 Last OV: 04/14/2013 UDS due, low risk

## 2013-10-22 ENCOUNTER — Ambulatory Visit: Payer: Managed Care, Other (non HMO) | Admitting: Family Medicine

## 2013-10-22 DIAGNOSIS — Z0289 Encounter for other administrative examinations: Secondary | ICD-10-CM

## 2013-11-19 ENCOUNTER — Encounter: Payer: Self-pay | Admitting: Family Medicine

## 2013-11-19 ENCOUNTER — Ambulatory Visit (INDEPENDENT_AMBULATORY_CARE_PROVIDER_SITE_OTHER): Payer: Managed Care, Other (non HMO) | Admitting: Family Medicine

## 2013-11-19 VITALS — BP 130/82 | HR 97 | Temp 98.7°F | Wt 141.6 lb

## 2013-11-19 DIAGNOSIS — F988 Other specified behavioral and emotional disorders with onset usually occurring in childhood and adolescence: Secondary | ICD-10-CM

## 2013-11-19 MED ORDER — METHYLPHENIDATE HCL ER (LA) 40 MG PO CP24: 40.0000 mg | ORAL_CAPSULE | ORAL | Status: DC

## 2013-11-19 NOTE — Progress Notes (Signed)
Pre visit review using our clinic review tool, if applicable. No additional management support is needed unless otherwise documented below in the visit note. 

## 2013-11-19 NOTE — Patient Instructions (Signed)
Attention Deficit Hyperactivity Disorder Attention deficit hyperactivity disorder (ADHD) is a problem with behavior issues based on the way the brain functions (neurobehavioral disorder). It is a common reason for behavior and academic problems in school. SYMPTOMS  There are 3 types of ADHD. The 3 types and some of the symptoms include:  Inattentive  Gets bored or distracted easily.  Loses or forgets things. Forgets to hand in homework.  Has trouble organizing or completing tasks.  Difficulty staying on task.  An inability to organize daily tasks and school work.  Leaving projects, chores, or homework unfinished.  Trouble paying attention or responding to details. Careless mistakes.  Difficulty following directions. Often seems like is not listening.  Dislikes activities that require sustained attention (like chores or homework).  Hyperactive-impulsive  Feels like it is impossible to sit still or stay in a seat. Fidgeting with hands and feet.  Trouble waiting turn.  Talking too much or out of turn. Interruptive.  Speaks or acts impulsively.  Aggressive, disruptive behavior.  Constantly busy or on the go, noisy.  Often leaves seat when they are expected to remain seated.  Often runs or climbs where it is not appropriate, or feels very restless.  Combined  Has symptoms of both of the above. Often children with ADHD feel discouraged about themselves and with school. They often perform well below their abilities in school. As children get older, the excess motor activities can calm down, but the problems with paying attention and staying organized persist. Most children do not outgrow ADHD but with good treatment can learn to cope with the symptoms. DIAGNOSIS  When ADHD is suspected, the diagnosis should be made by professionals trained in ADHD. This professional will collect information about the individual suspected of having ADHD. Information must be collected from  various settings where the person lives, works, or attends school.  Diagnosis will include:  Confirming symptoms began in childhood.  Ruling out other reasons for the child's behavior.  The health care providers will check with the child's school and check their medical records.  They will talk to teachers and parents.  Behavior rating scales for the child will be filled out by those dealing with the child on a daily basis. A diagnosis is made only after all information has been considered. TREATMENT  Treatment usually includes behavioral treatment, tutoring or extra support in school, and stimulant medicines. Because of the way a person's brain works with ADHD, these medicines decrease impulsivity and hyperactivity and increase attention. This is different than how they would work in a person who does not have ADHD. Other medicines used include antidepressants and certain blood pressure medicines. Most experts agree that treatment for ADHD should address all aspects of the person's functioning. Along with medicines, treatment should include structured classroom management at school. Parents should reward good behavior, provide constant discipline, and limit-setting. Tutoring should be available for the child as needed. ADHD is a life-long condition. If untreated, the disorder can have long-term serious effects into adolescence and adulthood. HOME CARE INSTRUCTIONS   Often with ADHD there is a lot of frustration among family members dealing with the condition. Blame and anger are also feelings that are common. In many cases, because the problem affects the family as a whole, the entire family may need help. A therapist can help the family find better ways to handle the disruptive behaviors of the person with ADHD and promote change. If the person with ADHD is young, most of the therapist's work   is with the parents. Parents will learn techniques for coping with and improving their child's  behavior. Sometimes only the child with the ADHD needs counseling. Your health care providers can help you make these decisions.  Children with ADHD may need help learning how to organize. Some helpful tips include:  Keep routines the same every day from wake-up time to bedtime. Schedule all activities, including homework and playtime. Keep the schedule in a place where the person with ADHD will often see it. Mark schedule changes as far in advance as possible.  Schedule outdoor and indoor recreation.  Have a place for everything and keep everything in its place. This includes clothing, backpacks, and school supplies.  Encourage writing down assignments and bringing home needed books. Work with your child's teachers for assistance in organizing school work.  Offer your child a well-balanced diet. Breakfast that includes a balance of whole grains, protein and, fruits or vegetables is especially important for school performance. Children should avoid drinks with caffeine including:  Soft drinks.  Coffee.  Tea.  However, some older children (adolescents) may find these drinks helpful in improving their attention. Because it can also be common for adolescents with ADHD to become addicted to caffeine, talk with your health care provider about what is a safe amount of caffeine intake for your child.  Children with ADHD need consistent rules that they can understand and follow. If rules are followed, give small rewards. Children with ADHD often receive, and expect, criticism. Look for good behavior and praise it. Set realistic goals. Give clear instructions. Look for activities that can foster success and self-esteem. Make time for pleasant activities with your child. Give lots of affection.  Parents are their children's greatest advocates. Learn as much as possible about ADHD. This helps you become a stronger and better advocate for your child. It also helps you educate your child's teachers and  instructors if they feel inadequate in these areas. Parent support groups are often helpful. A national group with local chapters is called Children and Adults with Attention Deficit Hyperactivity Disorder (CHADD). SEEK MEDICAL CARE IF:  Your child has repeated muscle twitches, cough or speech outbursts.  Your child has sleep problems.  Your child has a marked loss of appetite.  Your child develops depression.  Your child has new or worsening behavioral problems.  Your child develops dizziness.  Your child has a racing heart.  Your child has stomach pains.  Your child develops headaches. SEEK IMMEDIATE MEDICAL CARE IF:  Your child has been diagnosed with depression or anxiety and the symptoms seem to be getting worse.  Your child has been depressed and suddenly appears to have increased energy or motivation.  You are worried that your child is having a bad reaction to a medication he or she is taking for ADHD. Document Released: 07/13/2002 Document Revised: 05/13/2013 Document Reviewed: 03/30/2013 Exit[employer] Patient Information 2014 [employer].  

## 2013-11-19 NOTE — Progress Notes (Signed)
   Subjective:    Patient ID: [name], female    DOB: May 16, 1976, 38 y.o.   MRN: [id number]  HPI Pt here f/u add meds.  Pt doing well with no complaints.   Review of Systems    as above Objective:   Physical Exam BP 130/82  Pulse 97  Temp(Src) 98.7 F (37.1 C) (Oral)  Wt 141 lb 9.6 oz (64.229 kg)  SpO2 99% General appearance: alert, cooperative, appears stated age and no distress Lungs: clear to auscultation bilaterally Heart: regular rate and rhythm, S1, S2 normal, no murmur, click, rub or gallop Extremities: extremities normal, atraumatic, no cyanosis or edema       Assessment & Plan:  1. ADD (attention deficit disorder) Stable, cont meds rto 6 months - methylphenidate (RITALIN LA) 40 MG 24 hr capsule; Take 1 capsule (40 mg total) by mouth every morning.  Dispense: 30 capsule; Refill: 0 - methylphenidate (RITALIN LA) 40 MG 24 hr capsule; Take 1 capsule (40 mg total) by mouth every morning. Do not fill until Dec 19, 2013  Dispense: 30 capsule; Refill: 0 - methylphenidate (RITALIN LA) 40 MG 24 hr capsule; Take 1 capsule (40 mg total) by mouth every morning.  Dispense: 30 capsule; Refill: 0

## 2014-02-16 ENCOUNTER — Telehealth: Payer: Self-pay | Admitting: *Deleted

## 2014-02-16 DIAGNOSIS — F988 Other specified behavioral and emotional disorders with onset usually occurring in childhood and adolescence: Secondary | ICD-10-CM

## 2014-02-16 MED ORDER — METHYLPHENIDATE HCL ER (LA) 40 MG PO CP24: 40.0000 mg | ORAL_CAPSULE | ORAL | Status: DC

## 2014-02-16 NOTE — Telephone Encounter (Signed)
Caller name:  Takeyah Relation to pt:  self Call back number: [phone]  Pharmacy:  Reason for call:   Pt called requesting refill on  methylphenidate (RITALIN LA) 40 MG 24 hr capsule  Last filled 11/19/2013, #30, no refills, gave 3 months of prescriptions Last OV 11/19/2013

## 2014-02-16 NOTE — Telephone Encounter (Signed)
Patient aware Rx ready after lunch     KP

## 2014-05-18 ENCOUNTER — Telehealth: Payer: Self-pay | Admitting: Family Medicine

## 2014-05-18 DIAGNOSIS — F988 Other specified behavioral and emotional disorders with onset usually occurring in childhood and adolescence: Secondary | ICD-10-CM

## 2014-05-18 MED ORDER — VALACYCLOVIR HCL 1 G PO TABS: 1000.0000 mg | ORAL_TABLET | Freq: Three times a day (TID) | ORAL | Status: DC | PRN

## 2014-05-18 MED ORDER — METHYLPHENIDATE HCL ER (LA) 40 MG PO CP24: 40.0000 mg | ORAL_CAPSULE | ORAL | Status: DC

## 2014-05-18 NOTE — Telephone Encounter (Signed)
Patient aware Rx ready for pick up.      KP 

## 2014-05-18 NOTE — Telephone Encounter (Signed)
[place] for #30 on Ritalin but needs UDS- it has been over a year

## 2014-05-18 NOTE — Telephone Encounter (Signed)
I made the patient aware she will need a follow up and I can not give her a 3 mo supply, she voiced understanding. Follow up scheduled with Dr.Lowne on 06/10/14 Last seen 11/19/13 and filled 04/19/14 #30 UDS 03/02/13 Low risk    Please advise     KP

## 2014-05-18 NOTE — Telephone Encounter (Signed)
Caller name:Shock, [name] D Relation to pt: self Call back number: [place], [place] [facility] [phone]   Reason for call:   requesting a refill valACYclovir (VALTREX) 1000 MG tablet and methylphenidate (RITALIN LA) 40 MG 24 hr capsule pt stated she receives a 3 month supply and the date has to be different on each RX or the pharmacy will not refill it.

## 2014-06-08 ENCOUNTER — Encounter: Payer: Self-pay | Admitting: Family Medicine

## 2014-06-10 ENCOUNTER — Ambulatory Visit (INDEPENDENT_AMBULATORY_CARE_PROVIDER_SITE_OTHER): Payer: Managed Care, Other (non HMO) | Admitting: Family Medicine

## 2014-06-10 ENCOUNTER — Encounter: Payer: Self-pay | Admitting: Family Medicine

## 2014-06-10 VITALS — BP 110/60 | HR 88 | Temp 99.6°F

## 2014-06-10 DIAGNOSIS — F988 Other specified behavioral and emotional disorders with onset usually occurring in childhood and adolescence: Secondary | ICD-10-CM

## 2014-06-10 DIAGNOSIS — Z23 Encounter for immunization: Secondary | ICD-10-CM

## 2014-06-10 DIAGNOSIS — F909 Attention-deficit hyperactivity disorder, unspecified type: Secondary | ICD-10-CM

## 2014-06-10 MED ORDER — METHYLPHENIDATE HCL ER (LA) 40 MG PO CP24: 40.0000 mg | ORAL_CAPSULE | ORAL | Status: DC

## 2014-06-10 NOTE — Patient Instructions (Signed)

## 2014-06-10 NOTE — Progress Notes (Signed)
   Subjective:    Patient ID: [name], female    DOB: 11-Apr-1976, 38 y.o.   MRN: [id number]  HPI Pt here f/u ADD.  She is doing well with meds.  No complaints.   Review of Systems As above    Objective:   Physical Exam BP 110/60 mmHg  Pulse 88  Temp(Src) 99.6 F (37.6 C) (Oral)  SpO2 96% General appearance: alert, cooperative, appears stated age and no distress Lungs: clear to auscultation bilaterally Heart: S1, S2 normal Extremities: extremities normal, atraumatic, no cyanosis or edema       Assessment & Plan:  1. ADD (attention deficit disorder) Stable, con't current dose rto 6 months - methylphenidate (RITALIN LA) 40 MG 24 hr capsule; Take 1 capsule (40 mg total) by mouth every morning.  Dispense: 30 capsule; Refill: 0 - methylphenidate (RITALIN LA) 40 MG 24 hr capsule; Take 1 capsule (40 mg total) by mouth every morning.  Dispense: 31 capsule; Refill: 0 - methylphenidate (RITALIN LA) 40 MG 24 hr capsule; Take 1 capsule (40 mg total) by mouth every morning.  Dispense: 31 capsule; Refill: 0

## 2014-06-10 NOTE — Progress Notes (Signed)
Pre visit review using our clinic review tool, if applicable. No additional management support is needed unless otherwise documented below in the visit note. 

## 2014-06-11 NOTE — Addendum Note (Signed)
Addended by: Ewing Schlein on: 06/11/2014 08:47 AM   Modules accepted: Orders

## 2014-09-13 ENCOUNTER — Other Ambulatory Visit: Payer: Self-pay | Admitting: Obstetrics and Gynecology

## 2014-09-14 LAB — CYTOLOGY - PAP

## 2014-09-20 ENCOUNTER — Telehealth: Payer: Self-pay | Admitting: Family Medicine

## 2014-09-20 DIAGNOSIS — F988 Other specified behavioral and emotional disorders with onset usually occurring in childhood and adolescence: Secondary | ICD-10-CM

## 2014-09-20 MED ORDER — METHYLPHENIDATE HCL ER (LA) 40 MG PO CP24
40.0000 mg | ORAL_CAPSULE | ORAL | Status: DC
Start: 2014-09-20 — End: 2014-12-23

## 2014-09-20 MED ORDER — METHYLPHENIDATE HCL ER (LA) 40 MG PO CP24: 40.0000 mg | ORAL_CAPSULE | ORAL | Status: DC

## 2014-09-20 NOTE — Telephone Encounter (Signed)
Patient aware Rx ready for pick up.      KP 

## 2014-09-20 NOTE — Telephone Encounter (Signed)
Caller name: Evetta, Renner Relation to pt: self  Call back number: [phone]   Reason for call:  Pt requesting a 3 month refill of methylphenidate (RITALIN LA) 40 MG 24 hr capsule

## 2014-10-01 ENCOUNTER — Ambulatory Visit (INDEPENDENT_AMBULATORY_CARE_PROVIDER_SITE_OTHER): Payer: Managed Care, Other (non HMO) | Admitting: Family Medicine

## 2014-10-01 ENCOUNTER — Encounter: Payer: Self-pay | Admitting: Family Medicine

## 2014-10-01 VITALS — BP 118/88 | HR 95 | Temp 98.8°F | Wt 156.6 lb

## 2014-10-01 DIAGNOSIS — L853 Xerosis cutis: Secondary | ICD-10-CM

## 2014-10-01 DIAGNOSIS — L719 Rosacea, unspecified: Secondary | ICD-10-CM

## 2014-10-01 MED ORDER — METRONIDAZOLE 1 % EX GEL: Freq: Every day | CUTANEOUS | Status: DC

## 2014-10-01 MED ORDER — AMMONIUM LACTATE 12 % EX LOTN: 1.0000 "application " | TOPICAL_LOTION | CUTANEOUS | Status: DC | PRN

## 2014-10-01 NOTE — Patient Instructions (Signed)
Rosacea Rosacea is a long-term (chronic) condition that affects the skin of the face (cheeks, nose, brow, and chin) and sometimes the eyes. Rosacea causes the blood vessels near the surface of the skin to enlarge, resulting in redness. This condition usually begins after age 39. It occurs most often in light-skinned women. Without treatment, rosacea tends to get worse over time. There is no cure for rosacea, but treatment can help control your symptoms. CAUSES  The cause is unknown. It is thought that some people may inherit a tendency to develop rosacea. Certain triggers can make your rosacea worse, including:  Hot baths.  Exercise.  Sunlight.  Very hot or cold temperatures.  Hot or spicy foods and drinks.  Drinking alcohol.  Stress.  Taking blood pressure medicine.  Long-term use of topical steroids on the face. SYMPTOMS   Redness of the face.  Red bumps or pimples on the face.  Red, enlarged nose (rhinophyma).  Blushing easily.  Red lines on the skin.  Irritated or burning feeling in the eyes.  Swollen eyelids. DIAGNOSIS  Your caregiver can usually tell what is wrong by asking about your symptoms and performing a physical exam. TREATMENT  Avoiding triggers is an important part of treatment. You will also need to see a skin specialist (dermatologist) who can develop a treatment plan for you. The goals of treatment are to control your condition and to improve the appearance of your skin. It may take several weeks or months of treatment before you notice an improvement in your skin. Even after your skin improves, you will likely need to continue treatment to prevent your rosacea from coming back. Treatment methods may include:  Using sunscreen or sunblock daily to protect the skin.  Antibiotic medicine, such as metronidazole, applied directly to the skin.  Antibiotics taken by mouth. This is usually prescribed if you have eye problems from your rosacea.  Laser surgery  to improve the appearance of the skin. This surgery can reduce the appearance of red lines on the skin and can remove excess tissue from the nose to reduce its size. HOME CARE INSTRUCTIONS  Avoid things that seem to trigger your flare-ups.  If you are given antibiotics, take them as directed. Finish them even if you start to feel better.  Use a gentle facial cleanser that does not contain alcohol.  You may use a mild facial moisturizer.  Use a sunscreen or sunblock with SPF 30 or greater.  Wear a green-tinted foundation powder to conceal redness, if needed. Choose cosmetics that are noncomedogenic. This means they do not block your pores.  If your eyelids are affected, apply warm compresses to the eyelids. Do this up to 4 times a day or as directed by your caregiver. SEEK MEDICAL CARE IF:  Your skin problems get worse.  You feel depressed.  You lose your appetite.  You have trouble concentrating.  You have problems with your eyes, such as redness or itching. MAKE SURE YOU:  Understand these instructions.  Will watch your condition.  Will get help right away if you are not doing well or get worse. Document Released: 08/30/2004 Document Revised: 01/22/2012 Document Reviewed: 07/03/2011 [facility] Patient Information 2015 [place], [place]. This information is not intended to replace advice given to you by your health care provider. Make sure you discuss any questions you have with your health care provider.

## 2014-10-01 NOTE — Progress Notes (Signed)
Pre visit review using our clinic review tool, if applicable. No additional management support is needed unless otherwise documented below in the visit note. 

## 2014-10-02 NOTE — Progress Notes (Signed)
Subjective:    Patient ID: [name], female    DOB: 06-06-1976, 39 y.o.   MRN: [id number]  HPIPatient here c/o rash on face--- she has fam hx of rosacea  Past Medical History  Diagnosis Date  . GERD (gastroesophageal reflux disease)   . 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 Back pain   . Hypotension     per pt.   . Recurrent upper respiratory infection (URI)     viral infection - URI- 10/2011, penicillin treatmernt   . Kidney infection     1998- hosp.- [facility]  . 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 Anxiety     h/o panic attack, due to cysts in axilla area & requiring I&D, last episode, 2011,     Review of Systems  Constitutional: Negative for activity change, appetite change, fatigue and unexpected weight change.  Respiratory: Negative for cough and shortness of breath.   Cardiovascular: Negative for chest pain and palpitations.  Skin: Positive for color change and rash.  Psychiatric/Behavioral: Negative for behavioral problems and dysphoric mood. The patient is not nervous/anxious.        Objective:    Physical Exam  Constitutional: She is oriented to person, place, and time. She appears well-developed and well-nourished. No distress.  HENT:  Right Ear: External ear normal.  Left Ear: External ear normal.  Nose: Nose normal.  Mouth/Throat: Oropharynx is clear and moist.  Eyes: EOM are normal. Pupils are equal, round, and reactive to light.  Neck: Normal range of motion. Neck supple.  Cardiovascular: Normal rate, regular rhythm and normal heart sounds.   No murmur heard. Pulmonary/Chest: Effort normal and breath sounds normal. No respiratory distress. She has no wheezes. She has no rales. She exhibits no tenderness.  Neurological: She is alert and oriented to person, place, and time.  Skin: Rash noted. There is erythema.  Psychiatric: She has a normal mood and affect. Her behavior is normal. Judgment and thought content normal.    BP 118/88 mmHg  Pulse 95  Temp(Src) 98.8 F  (37.1 C) (Oral)  Wt 156 lb 9.6 oz (71.033 kg)  SpO2 97% Wt Readings from Last 3 Encounters:  10/01/14 156 lb 9.6 oz (71.033 kg)  11/19/13 141 lb 9.6 oz (64.229 kg)  04/14/13 144 lb 6.4 oz (65.499 kg)     Lab Results  Component Value Date   WBC 8.5 11/18/2011   HGB 10.0* 11/18/2011   HCT 31.1* 11/18/2011   PLT 323 11/18/2011   GLUCOSE 96 11/18/2011   ALT 11 11/08/2011   AST 17 11/08/2011   NA 133* 11/18/2011   K 4.6 11/18/2011   CL 95* 11/18/2011   CREATININE 0.69 11/18/2011   BUN 9 11/18/2011   CO2 28 11/18/2011   INR 1.07 11/08/2011    Dg Lumbar Spine 2-3 Views  11/08/2011   *RADIOLOGY REPORT*  Clinical Data: Degenerative disc disease, preop  LUMBAR SPINE - 2-3 VIEW  Comparison: 03/15/2008  Findings: Levels were numbered according to the previous study. There is mild narrowing L4-5.  There is moderate narrowing of the L5-S1 interspace with discogenic sclerosis in the adjacent vertebral bodies.  Normal alignment.  Negative for fracture or other acute abnormality.  IMPRESSION:  1.  Degenerative disc disease L4-5 and L5-S1.  Original Report Authenticated By: Trecia Rogers, M.D.      Assessment & Plan:   Problem List Items Addressed This Visit    None    Visit Diagnoses  Acne rosacea    -  Primary    Relevant Medications    METROGEL 1 % EX GEL    Dry skin        Relevant Medications    AMLACTIN 12 % EX LOTN        Garnet Koyanagi, DO

## 2014-10-12 ENCOUNTER — Telehealth: Payer: Self-pay

## 2014-10-12 NOTE — Telephone Encounter (Signed)
We need to recheck uds at next refill

## 2014-10-12 NOTE — Telephone Encounter (Signed)
Pt's urine drug screen was positive from Temazepam (Restoril).  Dr. Etter Sjogren requested to know who was prescribing patient temazepam?  Pt states "I've never even heard of that" and states she is not currently taking anything for insomnia.  Note routed to Dr. Etter Sjogren and UDS report placed in Dr. Nonda Lou red folder to alert of her of call.    Please advise.

## 2014-12-22 ENCOUNTER — Other Ambulatory Visit: Payer: Self-pay

## 2014-12-22 MED ORDER — VALACYCLOVIR HCL 1 G PO TABS: 1000.0000 mg | ORAL_TABLET | Freq: Three times a day (TID) | ORAL | Status: DC | PRN

## 2014-12-23 ENCOUNTER — Telehealth: Payer: Self-pay | Admitting: Family Medicine

## 2014-12-23 DIAGNOSIS — F988 Other specified behavioral and emotional disorders with onset usually occurring in childhood and adolescence: Secondary | ICD-10-CM

## 2014-12-23 MED ORDER — METHYLPHENIDATE HCL ER (LA) 40 MG PO CP24: 40.0000 mg | ORAL_CAPSULE | ORAL | Status: DC

## 2014-12-23 NOTE — Telephone Encounter (Signed)
Patient aware Rx ready for pick up.      KP 

## 2014-12-23 NOTE — Telephone Encounter (Signed)
Caller name: Shalini Mair Relationship to patient: self Can be reached: [phone] [facility] [address]  Reason for call: Pt called for refill on methylphenidate (RITALIN LA) 40 MG 24 hr capsule. Taking 1/day. Has 3 doses on hand.

## 2015-03-31 ENCOUNTER — Telehealth: Payer: Self-pay | Admitting: Family Medicine

## 2015-03-31 DIAGNOSIS — F988 Other specified behavioral and emotional disorders with onset usually occurring in childhood and adolescence: Secondary | ICD-10-CM

## 2015-03-31 MED ORDER — METHYLPHENIDATE HCL ER (LA) 40 MG PO CP24: 40.0000 mg | ORAL_CAPSULE | ORAL | Status: DC

## 2015-03-31 NOTE — Telephone Encounter (Signed)
Message left advising Rx ready for pick up.     KP 

## 2015-03-31 NOTE — Telephone Encounter (Signed)
Caller name: Shuntavia Yerby Relationship to patient: Self  Can be reached: [phone] Pharmacy:  Reason for call: pt need a refill on her Ritalin Rx. She says that she get a 48mth supply.

## 2015-05-02 ENCOUNTER — Telehealth: Payer: Self-pay | Admitting: Family Medicine

## 2015-05-02 DIAGNOSIS — F988 Other specified behavioral and emotional disorders with onset usually occurring in childhood and adolescence: Secondary | ICD-10-CM

## 2015-05-02 NOTE — Telephone Encounter (Signed)
Caller name: Rowene Relation to pt: self Call back [place]:  [id number] [place], [address]  Reason for call: Pt is requesting rx methylphenidate (RITALIN LA) 40 MG 24 hr capsule. Please advise.

## 2015-05-03 MED ORDER — METHYLPHENIDATE HCL ER (LA) 40 MG PO CP24: 40.0000 mg | ORAL_CAPSULE | ORAL | Status: DC

## 2015-05-03 NOTE — Telephone Encounter (Addendum)
Printed and placed for signature  Signed, placed at front desk. Patient notified. Spouse to pick up

## 2015-05-03 NOTE — Telephone Encounter (Signed)
Ok to refill 

## 2015-05-03 NOTE — Telephone Encounter (Signed)
Requesting:memethylphenidate (RITALIN LA) 40 MG 24 hr capsule thylphenidate (RITALIN LA) 40 MG 24 hr capsule [[id number] Contract on file UDS--12/2014  Positive for Ritalin Last OV 09/2014 Last Refill --03/31/2015 #31 no refills  Please Advise

## 2015-06-02 ENCOUNTER — Ambulatory Visit (INDEPENDENT_AMBULATORY_CARE_PROVIDER_SITE_OTHER): Payer: Managed Care, Other (non HMO) | Admitting: Family Medicine

## 2015-06-02 ENCOUNTER — Encounter: Payer: Self-pay | Admitting: Family Medicine

## 2015-06-02 VITALS — BP 134/86 | HR 93 | Temp 98.8°F | Ht 63.0 in | Wt 155.0 lb

## 2015-06-02 DIAGNOSIS — L719 Rosacea, unspecified: Secondary | ICD-10-CM

## 2015-06-02 DIAGNOSIS — F909 Attention-deficit hyperactivity disorder, unspecified type: Secondary | ICD-10-CM

## 2015-06-02 DIAGNOSIS — F988 Other specified behavioral and emotional disorders with onset usually occurring in childhood and adolescence: Secondary | ICD-10-CM

## 2015-06-02 MED ORDER — METRONIDAZOLE 0.75 % EX GEL: 1.0000 "application " | Freq: Two times a day (BID) | CUTANEOUS | Status: DC

## 2015-06-02 MED ORDER — METHYLPHENIDATE HCL ER (LA) 40 MG PO CP24: 40.0000 mg | ORAL_CAPSULE | ORAL | Status: DC

## 2015-06-02 MED ORDER — DOXYCYCLINE HYCLATE 100 MG PO TABS: 100.0000 mg | ORAL_TABLET | Freq: Two times a day (BID) | ORAL | Status: DC

## 2015-06-02 NOTE — Patient Instructions (Signed)

## 2015-06-02 NOTE — Progress Notes (Signed)
Pre visit review using our clinic review tool, if applicable. No additional management support is needed unless otherwise documented below in the visit note. 

## 2015-06-02 NOTE — Progress Notes (Signed)
Patient ID: [name], female    DOB: 03/22/1976  Age: 39 y.o. MRN: [id number]    Subjective:  Subjective HPI [name] presents for f/u add ----- she needs a refill on ritalin and her rosacea meds.  No complaints.     Review of Systems  Constitutional: Negative for diaphoresis, appetite change, fatigue and unexpected weight change.  Eyes: Negative for pain, redness and visual disturbance.  Respiratory: Negative for cough, chest tightness, shortness of breath and wheezing.   Cardiovascular: Negative for chest pain, palpitations and leg swelling.  Endocrine: Negative for cold intolerance, heat intolerance, polydipsia, polyphagia and polyuria.  Genitourinary: Negative for dysuria, frequency and difficulty urinating.  Neurological: Negative for dizziness, light-headedness, numbness and headaches.    [place] Past Medical History  Diagnosis Date  . GERD (gastroesophageal reflux disease)   . 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 Back pain   . Hypotension     per pt.   . Recurrent upper respiratory infection (URI)     viral infection - URI- 10/2011, penicillin treatmernt   . Kidney infection     1998- hosp.- [facility]  . 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 Anxiety     h/o panic attack, due to cysts in axilla area & requiring I&D, last episode, 2011,     She has past surgical history that includes Cesarean section; Colonoscopy; Wisdom tooth extraction; and Anterior lumbar fusion (11/14/2011).   Her family history includes Lupus in her mother. There is no history of Anesthesia problems.She reports that she quit smoking about 3 years ago. Her smoking use included Cigarettes. She has a 5 pack-year smoking history. She has never used smokeless tobacco. She reports that she drinks alcohol. She reports that she does not use illicit drugs.  Current Outpatient Prescriptions on File Prior to Visit  Medication Sig Dispense Refill  . ammonium lactate (AMLACTIN) 12 % lotion Apply 1 application topically  as needed for dry skin. 400 g 0  . levonorgestrel (MIRENA) 20 MCG/24HR IUD 1 each by Intrauterine route once.    . valACYclovir (VALTREX) 1000 MG tablet Take 1 tablet (1,000 mg total) by mouth 3 (three) times daily as needed. For flare ups 90 tablet 2  . [DISCONTINUED] Norgestim-Eth Radene Journey Triphasic (ORTHO TRI-CYCLEN, 28, PO) Take 0.035 mg by mouth daily.      No current facility-administered medications on file prior to visit.     Objective:  Objective Physical Exam  Constitutional: She is oriented to person, place, and time. She appears well-developed and well-nourished.  HENT:  Head: Normocephalic and atraumatic.  Eyes: Conjunctivae and EOM are normal.  Neck: Normal range of motion. Neck supple. No JVD present. Carotid bruit is not present. No thyromegaly present.  Cardiovascular: Normal rate, regular rhythm and normal heart sounds.   No murmur heard. Pulmonary/Chest: Effort normal and breath sounds normal. No respiratory distress. She has no wheezes. She has no rales. She exhibits no tenderness.  Musculoskeletal: She exhibits no edema.  Neurological: She is alert and oriented to person, place, and time.  Psychiatric: She has a normal mood and affect.  Nursing note and vitals reviewed.  BP 134/86 mmHg  Pulse 93  Temp(Src) 98.8 F (37.1 C) (Oral)  Ht 5\' 3"  (1.6 m)  Wt 155 lb (70.308 kg)  BMI 27.46 kg/m2  SpO2 98% Wt Readings from Last 3 Encounters:  06/02/15 155 lb (70.308 kg)  10/01/14 156 lb 9.6 oz (71.033 kg)  11/19/13 141 lb 9.6  oz (64.229 kg)     Lab Results  Component Value Date   WBC 8.5 11/18/2011   HGB 10.0* 11/18/2011   HCT 31.1* 11/18/2011   PLT 323 11/18/2011   GLUCOSE 96 11/18/2011   ALT 11 11/08/2011   AST 17 11/08/2011   NA 133* 11/18/2011   K 4.6 11/18/2011   CL 95* 11/18/2011   CREATININE 0.69 11/18/2011   BUN 9 11/18/2011   CO2 28 11/18/2011   INR 1.07 11/08/2011    Dg Lumbar Spine 2-3 Views  11/08/2011  *RADIOLOGY REPORT* Clinical Data:  Degenerative disc disease, preop LUMBAR SPINE - 2-3 VIEW Comparison: 03/15/2008 Findings: Levels were numbered according to the previous study. There is mild narrowing L4-5.  There is moderate narrowing of the L5-S1 interspace with discogenic sclerosis in the adjacent vertebral bodies.  Normal alignment.  Negative for fracture or other acute abnormality. IMPRESSION: 1.  Degenerative disc disease L4-5 and L5-S1. Original Report Authenticated By: Trecia Rogers, M.D.    Assessment & Plan:  Plan I have discontinued Ms. Christner's metroNIDAZOLE and doxycycline. I am also having her start on methylphenidate, methylphenidate, and metroNIDAZOLE. Additionally, I am having her maintain her levonorgestrel, ammonium lactate, valACYclovir, and methylphenidate.  Meds ordered this encounter  Medications  . methylphenidate (RITALIN LA) 40 MG 24 hr capsule    Sig: Take 1 capsule (40 mg total) by mouth every morning.    Dispense:  31 capsule    Refill:  0    Do not fill until Jul 03, 2015  . methylphenidate (RITALIN LA) 40 MG 24 hr capsule    Sig: Take 1 capsule (40 mg total) by mouth every morning.    Dispense:  31 capsule    Refill:  0  . methylphenidate (RITALIN LA) 40 MG 24 hr capsule    Sig: Take 1 capsule (40 mg total) by mouth every morning.    Dispense:  31 capsule    Refill:  0    Do not fill until Aug 02, 2015  . DISCONTD: doxycycline (VIBRA-TABS) 100 MG tablet    Sig: Take 1 tablet (100 mg total) by mouth 2 (two) times daily.    Dispense:  60 tablet    Refill:  2  . metroNIDAZOLE (METROGEL) 0.75 % gel    Sig: Apply 1 application topically 2 (two) times daily.    Dispense:  45 g    Refill:  0    Problem List Items Addressed This Visit    None    Visit Diagnoses    ADD (attention deficit disorder)    -  Primary    Relevant Medications    methylphenidate (RITALIN LA) 40 MG 24 hr capsule    methylphenidate (RITALIN LA) 40 MG 24 hr capsule    methylphenidate (RITALIN LA) 40 MG 24  hr capsule    Rosacea        Relevant Medications    metroNIDAZOLE (METROGEL) 0.75 % gel       Follow-up: No Follow-up on file.  Garnet Koyanagi, DO

## 2015-06-03 ENCOUNTER — Ambulatory Visit: Payer: Managed Care, Other (non HMO) | Admitting: Family Medicine

## 2015-09-05 ENCOUNTER — Telehealth: Payer: Self-pay | Admitting: Family Medicine

## 2015-09-05 DIAGNOSIS — F988 Other specified behavioral and emotional disorders with onset usually occurring in childhood and adolescence: Secondary | ICD-10-CM

## 2015-09-05 MED ORDER — METHYLPHENIDATE HCL ER (LA) 40 MG PO CP24: 40.0000 mg | ORAL_CAPSULE | ORAL | Status: DC

## 2015-09-05 NOTE — Telephone Encounter (Signed)
Pt called for refill on generic ritalin, has 1 left for tomorrow, please call when RX ready for pick up at [phone].

## 2015-09-05 NOTE — Telephone Encounter (Signed)
Patient aware Rx ready for pick up.      KP 

## 2015-10-04 ENCOUNTER — Telehealth: Payer: Self-pay | Admitting: Family Medicine

## 2015-10-04 DIAGNOSIS — L719 Rosacea, unspecified: Secondary | ICD-10-CM

## 2015-10-04 NOTE — Telephone Encounter (Signed)
Ref has been placed.     KP 

## 2015-10-04 NOTE — Telephone Encounter (Signed)
Did she say what was wrong with her face?

## 2015-10-04 NOTE — Telephone Encounter (Signed)
Caller name: Self  Can be reached: [phone]   Reason for call: Patient request a referral to see a dermatologist for her face

## 2015-10-04 NOTE — Telephone Encounter (Signed)
im fine with referral but we need to know why.

## 2015-10-05 ENCOUNTER — Encounter: Payer: Self-pay | Admitting: Family Medicine

## 2015-12-05 ENCOUNTER — Encounter: Payer: Self-pay | Admitting: Family Medicine

## 2015-12-05 ENCOUNTER — Ambulatory Visit (INDEPENDENT_AMBULATORY_CARE_PROVIDER_SITE_OTHER): Payer: Managed Care, Other (non HMO) | Admitting: Family Medicine

## 2015-12-05 VITALS — BP 153/102 | HR 88 | Temp 98.7°F | Wt 152.2 lb

## 2015-12-05 DIAGNOSIS — M5441 Lumbago with sciatica, right side: Secondary | ICD-10-CM

## 2015-12-05 MED ORDER — DIAZEPAM 10 MG PO TABS: 10.0000 mg | ORAL_TABLET | Freq: Three times a day (TID) | ORAL | Status: DC | PRN

## 2015-12-05 MED ORDER — MELOXICAM 15 MG PO TABS: 15.0000 mg | ORAL_TABLET | Freq: Every day | ORAL | Status: DC

## 2015-12-05 MED ORDER — TRAMADOL HCL 50 MG PO TABS: 50.0000 mg | ORAL_TABLET | Freq: Three times a day (TID) | ORAL | Status: DC | PRN

## 2015-12-05 MED FILL — traMADol HCL 50 MG TABS: 50 | 10 days supply | Qty: 30 | Fill #0

## 2015-12-05 MED FILL — diazePAM 10 MG TABS: 10 | 10 days supply | Qty: 30 | Fill #0 | Status: TO

## 2015-12-05 NOTE — Progress Notes (Signed)
Pre visit review using our clinic review tool, if applicable. No additional management support is needed unless otherwise documented below in the visit note. 

## 2015-12-05 NOTE — Progress Notes (Signed)
Patient ID: [name], female    DOB: 07/11/1976  Age: 40 y.o. MRN: [id number]    Subjective:  Subjective HPI MARTRICE JABLONOWSKI presents for low back pain that started Thursday night.  No known injury.  Pt was standing at stove and she felt a sharp pain in her right hip and then she could not walk.  Pain shot down leg to foot.  Her husband is with her today and she had to be brought up in a wheel chair.  Pt is in excruciating pain lying on table.  Pt has a hx of L4-5 fusion about 5 years again with orthopedic in [place].  No loss of bowel of bladder function.    Review of Systems  Constitutional: Negative for diaphoresis, appetite change, fatigue and unexpected weight change.  Eyes: Negative for pain, redness and visual disturbance.  Respiratory: Negative for cough, chest tightness, shortness of breath and wheezing.   Cardiovascular: Negative for chest pain, palpitations and leg swelling.  Endocrine: Negative for cold intolerance, heat intolerance, polydipsia, polyphagia and polyuria.  Genitourinary: Negative for dysuria, frequency and difficulty urinating.  Musculoskeletal: Positive for back pain and gait problem.  Neurological: Positive for weakness and numbness. Negative for dizziness, light-headedness and headaches.    History Past Medical History  Diagnosis Date  . GERD (gastroesophageal reflux disease)   . 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 Back pain   . Hypotension     per pt.   . Recurrent upper respiratory infection (URI)     viral infection - URI- 10/2011, penicillin treatmernt   . Kidney infection     1998- hosp.- [facility]  . 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 Anxiety     h/o panic attack, due to cysts in axilla area & requiring I&D, last episode, 2011,     She has past surgical history that includes Cesarean section; Colonoscopy; Wisdom tooth extraction; Anterior lumbar fusion (11/14/2011); and Spine surgery.   Her family history includes Lupus in her mother. There is no history of  Anesthesia problems.She reports that she quit smoking about 4 years ago. Her smoking use included Cigarettes. She has a 5 pack-year smoking history. She has never used smokeless tobacco. She reports that she drinks alcohol. She reports that she does not use illicit drugs.  Current Outpatient Prescriptions on File Prior to Visit  Medication Sig Dispense Refill  . ammonium lactate (AMLACTIN) 12 % lotion Apply 1 application topically as needed for dry skin. 400 g 0  . levonorgestrel (MIRENA) 20 MCG/24HR IUD 1 each by Intrauterine route once.    . methylphenidate (RITALIN LA) 40 MG 24 hr capsule Take 1 capsule (40 mg total) by mouth every morning. 31 capsule 0  . methylphenidate (RITALIN LA) 40 MG 24 hr capsule Take 1 capsule (40 mg total) by mouth every morning. 31 capsule 0  . methylphenidate (RITALIN LA) 40 MG 24 hr capsule Take 1 capsule (40 mg total) by mouth every morning. 31 capsule 0  . valACYclovir (VALTREX) 1000 MG tablet Take 1 tablet (1,000 mg total) by mouth 3 (three) times daily as needed. For flare ups 90 tablet 2  . [DISCONTINUED] Norgestim-Eth Radene Journey Triphasic (ORTHO TRI-CYCLEN, 28, PO) Take 0.035 mg by mouth daily.      No current facility-administered medications on file prior to visit.     Objective:  Objective Physical Exam  Constitutional: She is oriented to person, place, and time. She appears well-developed and well-nourished.  HENT:  Head: Normocephalic and atraumatic.  Eyes: Conjunctivae and EOM are normal.  Neck: Normal range of motion. Neck supple. No JVD present. Carotid bruit is not present. No thyromegaly present.  Cardiovascular: Normal rate, regular rhythm and normal heart sounds.   No murmur heard. Pulmonary/Chest: Effort normal and breath sounds normal. No respiratory distress. She has no wheezes. She has no rales. She exhibits no tenderness.  Musculoskeletal: She exhibits tenderness. She exhibits no edema.  Neurological: She is alert and oriented to person,  place, and time. She displays abnormal reflex.  Decreased strength in R leg and foot Decreased dtr r patella  + slr R  Pt unable to stand without significant pain in R hip and leg  Psychiatric: She has a normal mood and affect.  Nursing note and vitals reviewed.  BP 153/102 mmHg  Pulse 88  Temp(Src) 98.7 F (37.1 C) (Oral)  Wt 152 lb 3.2 oz (69.037 kg)  SpO2 98% Wt Readings from Last 3 Encounters:  12/05/15 152 lb 3.2 oz (69.037 kg)  06/02/15 155 lb (70.308 kg)  10/01/14 156 lb 9.6 oz (71.033 kg)     Lab Results  Component Value Date   WBC 8.5 11/18/2011   HGB 10.0* 11/18/2011   HCT 31.1* 11/18/2011   PLT 323 11/18/2011   GLUCOSE 96 11/18/2011   ALT 11 11/08/2011   AST 17 11/08/2011   NA 133* 11/18/2011   K 4.6 11/18/2011   CL 95* 11/18/2011   CREATININE 0.69 11/18/2011   BUN 9 11/18/2011   CO2 28 11/18/2011   INR 1.07 11/08/2011    Dg Lumbar Spine 2-3 Views  11/08/2011  *RADIOLOGY REPORT* Clinical Data: Degenerative disc disease, preop LUMBAR SPINE - 2-3 VIEW Comparison: 03/15/2008 Findings: Levels were numbered according to the previous study. There is mild narrowing L4-5.  There is moderate narrowing of the L5-S1 interspace with discogenic sclerosis in the adjacent vertebral bodies.  Normal alignment.  Negative for fracture or other acute abnormality. IMPRESSION: 1.  Degenerative disc disease L4-5 and L5-S1. Original Report Authenticated By: Trecia Rogers, M.D.    Assessment & Plan:  Plan I have discontinued Ms. Bring's metroNIDAZOLE. I am also having her start on diazepam, meloxicam, and traMADol. Additionally, I am having her maintain her levonorgestrel, ammonium lactate, valACYclovir, methylphenidate, methylphenidate, and methylphenidate.  Meds ordered this encounter  Medications  . diazepam (VALIUM) 10 MG tablet    Sig: Take 1 tablet (10 mg total) by mouth every 8 (eight) hours as needed for anxiety.    Dispense:  30 tablet    Refill:  1  .  meloxicam (MOBIC) 15 MG tablet    Sig: Take 1 tablet (15 mg total) by mouth daily.    Dispense:  30 tablet    Refill:  0  . traMADol (ULTRAM) 50 MG tablet    Sig: Take 1 tablet (50 mg total) by mouth every 8 (eight) hours as needed.    Dispense:  30 tablet    Refill:  0    Problem List Items Addressed This Visit    None    Visit Diagnoses    Low back pain with radiation, right    -  Primary    Relevant Medications    diazepam (VALIUM) 10 MG tablet    meloxicam (MOBIC) 15 MG tablet    traMADol (ULTRAM) 50 MG tablet    Other Relevant Orders    MR Lumbar Spine W Wo Contrast     see avs Pt unable  to to Pt due to sever pain  Insurance company making pt wait  2 days before they will decide if MRI is needed Pt instructed to go to ER if meds to not work and pain becomes even more severe.    Follow-up: Return if symptoms worsen or fail to improve.  Ann Held, DO

## 2015-12-05 NOTE — Patient Instructions (Signed)

## 2015-12-12 ENCOUNTER — Telehealth: Payer: Self-pay | Admitting: Family Medicine

## 2015-12-12 DIAGNOSIS — F988 Other specified behavioral and emotional disorders with onset usually occurring in childhood and adolescence: Secondary | ICD-10-CM

## 2015-12-12 MED ORDER — METHYLPHENIDATE HCL ER (LA) 40 MG PO CP24: 40.0000 mg | ORAL_CAPSULE | ORAL | Status: DC

## 2015-12-12 NOTE — Telephone Encounter (Signed)
Med's printed, awaiting signature.    KP

## 2015-12-12 NOTE — Telephone Encounter (Signed)
Pt request a refill for methylphenidate. Please let me know when ready I will call pt to let know for pick up. Pt would like to pick up today if possible.  Pt is going to send her spouse for pick up.    CB: [phone] (work) or [phone] (mobile)

## 2015-12-13 ENCOUNTER — Encounter (HOSPITAL_COMMUNITY): Payer: Self-pay

## 2015-12-13 ENCOUNTER — Encounter (HOSPITAL_COMMUNITY)
Admission: RE | Admit: 2015-12-13 | Discharge: 2015-12-13 | Disposition: A | Discharge status: Home / Self Care | Payer: Managed Care, Other (non HMO) | Source: Ambulatory Visit | Attending: Specialist | Admitting: Specialist

## 2015-12-13 ENCOUNTER — Ambulatory Visit: Payer: Self-pay | Admitting: Orthopedic Surgery

## 2015-12-13 ENCOUNTER — Ambulatory Visit (HOSPITAL_COMMUNITY)
Admission: RE | Admit: 2015-12-13 | Discharge: 2015-12-13 | Disposition: A | Discharge status: Home / Self Care | Payer: Managed Care, Other (non HMO) | Source: Ambulatory Visit | Attending: Orthopedic Surgery | Admitting: Orthopedic Surgery

## 2015-12-13 DIAGNOSIS — M5126 Other intervertebral disc displacement, lumbar region: Secondary | ICD-10-CM | POA: Diagnosis present

## 2015-12-13 HISTORY — DX: Anemia, unspecified: D64.9

## 2015-12-13 HISTORY — DX: Unspecified convulsions: R56.9

## 2015-12-13 LAB — BASIC METABOLIC PANEL
Anion gap: 7 (ref 5–15)
BUN: 12 mg/dL (ref 6–20)
CALCIUM: 9.3 mg/dL (ref 8.9–10.3)
CO2: 28 mmol/L (ref 22–32)
CREATININE: 0.67 mg/dL (ref 0.44–1.00)
Chloride: 104 mmol/L (ref 101–111)
GFR calc non Af Amer: >60 mL/min (ref >60)
Glucose, Bld: 109 mg/dL — ABNORMAL HIGH (ref 65–99)
Potassium: 4.1 mmol/L (ref 3.5–5.1)
Sodium: 139 mmol/L (ref 135–145)

## 2015-12-13 LAB — SURGICAL PCR SCREEN
MRSA, PCR: NEGATIVE
Staphylococcus aureus: POSITIVE — AB

## 2015-12-13 LAB — CBC
HCT: 39.8 % (ref 36.0–46.0)
Hemoglobin: 13.5 g/dL (ref 12.0–15.0)
MCH: 32.2 pg (ref 26.0–34.0)
MCHC: 33.9 g/dL (ref 30.0–36.0)
MCV: 95 fL (ref 78.0–100.0)
PLATELETS: 273 10*3/uL (ref 150–400)
RBC: 4.19 MIL/uL (ref 3.87–5.11)
RDW: 13.2 % (ref 11.5–15.5)
WBC: 7.7 10*3/uL (ref 4.0–10.5)

## 2015-12-13 LAB — HCG, SERUM, QUALITATIVE: Preg, Serum: NEGATIVE

## 2015-12-13 LAB — ABO/RH: ABO/RH(D): A POS

## 2015-12-13 NOTE — H&P (Signed)
[name] is an 40 y.o. female.   Chief Complaint: back and RLE pain HPI: The patient is a 40 year old female who presents today for follow up of their back. The patient is being followed for their low back symptoms. They are now 9 day(s) out from a flare up. The patient is 4 years, 1 months out from an ALIF. Symptoms reported today include: pain. The patient states that they are doing poorly. Current treatment includes: relative rest, activity modification, pain medications and Prednisone. The following medication has been used for pain control: Ultram and Norco. The patient presents today following MRI (unable to complete the MRI).   Prior to back surgery, she had only been having back pain and was not having any radicular symptoms from her disk degeneration at L5-S1. She reports new onset of symptoms for a week and a half. Denies any specific injury or change in activity, however, started experiencing right-sided lower back pain, which progressively worsened throughout the last week and is now radiating down not only into the buttock, but down the anterior thigh into the anterior lower leg with numbness in the anterior lower leg and tingling into the dorsal aspect of the foot, which the tingling in the foot seems to be intermittent. She has not noticed any weakness. Over the last few days, she has been taking some anti-inflammatories that does seem to make the pain in her back less sharp; however, she is unable to sit or stand comfortably her most comfortable position, which is not pain free as lying on her left side in the fetal position with a pillow between her knees. She is taking tramadol for pain. Does not want to take narcotic pain medications at this point as she had been taking them extensively prior to her back surgery. She is also taking Valium as needed for spasms, which does seem to help. She had been also having spasms in the right thigh.  Her pain is worse with flexion. She and her  family had initially been planning on going on a [employer] this coming weekend.  [name] follows up, unable to tolerate her MRI, unable to lay in a certain position. So, they had to discontinue. They had a partial MRI. She is here with her father, who is a physician  Past Medical History  Diagnosis Date  . GERD (gastroesophageal reflux disease)   . 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 Back pain   . Hypotension     per pt.   . Recurrent upper respiratory infection (URI)     viral infection - URI- 10/2011, penicillin treatmernt   . Kidney infection     1998- hosp.- [facility]  . 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 Anxiety     h/o panic attack, due to cysts in axilla area & requiring I&D, last episode, 2011,     Past Surgical History  Procedure Laterality Date  . Cesarean section      2007- /w epidural  anesthesia   . Colonoscopy    . Wisdom tooth extraction      young adult  . Anterior lumbar fusion  11/14/2011    Procedure: ANTERIOR LUMBAR FUSION 2 LEVELS;  Surgeon: Johnn Hai, MD;  Location: [facility];  Service: Orthopedics;  Laterality: N/A;  ALIF L5-S1  . Spine surgery      fusion    Family History  Problem Relation Age of Onset  . Lupus Mother   .  Anesthesia problems Neg Hx    Social History:  reports that she quit smoking about 4 years ago. Her smoking use included Cigarettes. She has a 5 pack-year smoking history. She has never used smokeless tobacco. She reports that she drinks alcohol. She reports that she does not use illicit drugs.  Allergies:  Allergies  Allergen Reactions  . Tamiflu [Oseltamivir Phosphate] Other (See Comments)    Seizure  . Ciprofloxacin Diarrhea and Nausea And Vomiting  . Metoclopramide Hcl Other (See Comments)    "lock jaw"  . Sulfa Antibiotics Nausea And Vomiting     (Not in a hospital admission)  No results found for this or any previous visit (from the past 48 hour(s)). No results found.  Review of Systems  Constitutional: Negative.    HENT: Negative.   Respiratory: Negative.   Gastrointestinal: Negative.   Genitourinary: Negative.   Musculoskeletal: Positive for back pain.  Skin: Negative.   Neurological: Positive for sensory change and focal weakness.    There were no vitals taken for this visit. Physical Exam  Constitutional: She is oriented to person, place, and time. She appears well-developed. She appears distressed.  HENT:  Head: Normocephalic.  Eyes: Pupils are equal, round, and reactive to light.  Neck: Normal range of motion.  Cardiovascular: Normal rate.   Respiratory: Effort normal.  GI: Soft.  Musculoskeletal:  On examination, she is in severe distress. She is lying down. She is crying. She has numbness on the anterior aspect of the tibia. She was unable to tolerate laying down on her side.  Again, in severe distress. Mood and affect is anxious. She has decreased sensation of anterior tibia, although she is able to dorsiflex and plantar flex the foot and has good EHL and tibialis anterior. Straight leg raise is positive.  Neurological: She is oriented to person, place, and time.    Initial review of the MRI, incomplete study, the T2s are performed. It does appear that she has disc degeneration of L4-L5 and what may be a foraminal disc herniation at L4-L5 to the right. There is motion artifact on the axials.  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 The fusion construct actually looks good.  I called down the MRI facility. We are going to try different options in terms of obtaining the MRI. We can just focus and prioritize L4-L5, especially the axial images. I gave her a note such that she should not travel, continue on the prednisone. She had an injection of Norco. She will take a couple of pain medicines, so she can better tolerate the MRI. We will obtain a stat reading on that for her, and we will proceed accordingly. She may  require a discectomy at L4-L5 given the severity of her symptoms.  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 Dr. Tonita Cong called pt and her father to discuss. Due to significant ongoing pain, weakness, neural tension signs, Dr. Tonita Cong and the patient mutually agreed to proceed with microlumbar decompression L4-5 right. We discussed the procedure itself as well as risks, complications and alternatives including but not limited to DVT, PE, failure of procedure, need for secondary procedure, anesthesia risk, even death. Discussed post-op protocols. She will follow up 10-14 days post-op for suture removal and call with any questions or concerns.  Plan microlumbar decompression L4-5 right  Cecilie Kicks., PA-C for Dr. Tonita Cong 12/13/2015, 8:38 AM

## 2015-12-13 NOTE — Telephone Encounter (Signed)
Pt called, informed RX at front desk, husband to pick up

## 2015-12-13 NOTE — Patient Instructions (Addendum)
[name]  12/13/2015   Your procedure is scheduled on: Thursday Dec 15, 2015  Report to [facility]  Entrance take [place]  elevators to 3rd floor to  [place] at 7:30 AM.  Call this number if you have problems the morning of surgery [phone]   Remember: ONLY 1 PERSON MAY GO WITH YOU TO SHORT STAY TO GET  READY MORNING OF [place].  Do not eat food or drink liquids :After Midnight.     Take these medicines the morning of surgery with A SIP OF WATER: Diazepam (Valium) if needed; Hydrocodone-Acetaminophen if needed                               You may not have any metal on your body including hair pins and              piercings  Do not wear jewelry, make-up, lotions, powders or perfumes, deodorant             Do not wear nail polish.  Do not shave  48 hours prior to surgery.          Do not bring valuables to the hospital. [place].  Contacts, dentures or bridgework may not be worn into surgery.  Leave suitcase in the car. After surgery it may be brought to your room.                Please read over the following fact sheets you were given:INCENTIVE SPIROMETER  _____________________________________________________________________             [facility] - Preparing for Surgery Before surgery, you can play an important role.  Because skin is not sterile, your skin needs to be as free of germs as possible.  You can reduce the number of germs on your skin by washing with CHG (chlorahexidine gluconate) soap before surgery.  CHG is an antiseptic cleaner which kills germs and bonds with the skin to continue killing germs even after washing. Please DO NOT use if you have an allergy to CHG or antibacterial soaps.  If your skin becomes reddened/irritated stop using the CHG and inform your nurse when you arrive at Short Stay. Do not shave (including legs and underarms) for at least 48 hours  prior to the first CHG shower.  You may shave your face/neck. Please follow these instructions carefully:  1.  Shower with CHG Soap the night before surgery and the  morning of Surgery.  2.  If you choose to wash your hair, wash your hair first as usual with your  normal  shampoo.  3.  After you shampoo, rinse your hair and body thoroughly to remove the  shampoo.                           4.  Use CHG as you would any other liquid soap.  You can apply chg directly  to the skin and wash                       Gently with a scrungie or clean washcloth.  5.  Apply the CHG Soap to your body ONLY  FROM THE NECK DOWN.   Do not use on face/ open                           Wound or open sores. Avoid contact with eyes, ears mouth and genitals (private parts).                       Wash face,  Genitals (private parts) with your normal soap.             6.  Wash thoroughly, paying special attention to the area where your surgery  will be performed.  7.  Thoroughly rinse your body with warm water from the neck down.  8.  DO NOT shower/wash with your normal soap after using and rinsing off  the CHG Soap.                9.  Pat yourself dry with a clean towel.            10.  Wear clean pajamas.            11.  Place clean sheets on your bed the night of your first shower and do not  sleep with pets. Day of Surgery : Do not apply any lotions/deodorants the morning of surgery.  Please wear clean clothes to the hospital/surgery center.  FAILURE TO FOLLOW THESE INSTRUCTIONS MAY RESULT IN THE CANCELLATION OF YOUR SURGERY PATIENT SIGNATURE_________________________________  NURSE SIGNATURE__________________________________  ________________________________________________________________________   Adam Phenix  An incentive spirometer is a tool that can help keep your lungs clear and active. This tool measures how well you are filling your lungs with each breath. Taking long deep breaths may help reverse  or decrease the chance of developing breathing (pulmonary) problems (especially infection) following:  A long period of time when you are unable to move or be active. BEFORE THE PROCEDURE   If the spirometer includes an indicator to show your best effort, your nurse or respiratory therapist will set it to a desired goal.  If possible, sit up straight or lean slightly forward. Try not to slouch.  Hold the incentive spirometer in an upright position. INSTRUCTIONS FOR USE  1. Sit on the edge of your bed if possible, or sit up as far as you can in bed or on a chair. 2. Hold the incentive spirometer in an upright position. 3. Breathe out normally. 4. Place the mouthpiece in your mouth and seal your lips tightly around it. 5. Breathe in slowly and as deeply as possible, raising the piston or the ball toward the top of the column. 6. Hold your breath for 3-5 seconds or for as long as possible. Allow the piston or ball to fall to the bottom of the column. 7. Remove the mouthpiece from your mouth and breathe out normally. 8. Rest for a few seconds and repeat Steps 1 through 7 at least 10 times every 1-2 hours when you are awake. Take your time and take a few normal breaths between deep breaths. 9. The spirometer may include an indicator to show your best effort. Use the indicator as a goal to work toward during each repetition. 10. After each set of 10 deep breaths, practice coughing to be sure your lungs are clear. If you have an incision (the cut made at the time of surgery), support your incision when coughing by placing a pillow or rolled up towels firmly against it. Once you  are able to get out of bed, walk around indoors and cough well. You may stop using the incentive spirometer when instructed by your caregiver.  RISKS AND COMPLICATIONS  Take your time so you do not get dizzy or light-headed.  If you are in pain, you may need to take or ask for pain medication before doing incentive  spirometry. It is harder to take a deep breath if you are having pain. AFTER USE  Rest and breathe slowly and easily.  It can be helpful to keep track of a log of your progress. Your caregiver can provide you with a simple table to help with this. If you are using the spirometer at home, follow these instructions: [place] IF:   You are having difficultly using the spirometer.  You have trouble using the spirometer as often as instructed.  Your pain medication is not giving enough relief while using the spirometer.  You develop fever of 100.5 F (38.1 C) or higher. SEEK IMMEDIATE MEDICAL CARE IF:   You cough up bloody sputum that had not been present before.  You develop fever of 102 F (38.9 C) or greater.  You develop worsening pain at or near the incision site. MAKE SURE YOU:   Understand these instructions.  Will watch your condition.  Will get help right away if you are not doing well or get worse. Document Released: 12/03/2006 Document Revised: 10/15/2011 Document Reviewed: 02/03/2007 [facility] Patient Information 2014 [place], [place].   ________________________________________________________________________

## 2015-12-13 NOTE — Progress Notes (Signed)
PCR screening in epic per PAT visit 12/13/2015 positive for STAPH. Results sent to Dr Tonita Cong. Due to time limit prior to surgical date pt will need nasal betadine swab-unable to complete mupriocin ointment prescription in time frame.

## 2015-12-15 ENCOUNTER — Observation Stay (HOSPITAL_COMMUNITY)
Admission: RE | Admit: 2015-12-15 | Discharge: 2015-12-16 | Disposition: A | Discharge status: Home / Self Care | Payer: Managed Care, Other (non HMO) | Source: Ambulatory Visit | Attending: Specialist | Admitting: Specialist

## 2015-12-15 ENCOUNTER — Ambulatory Visit (HOSPITAL_COMMUNITY): Payer: Managed Care, Other (non HMO)

## 2015-12-15 ENCOUNTER — Encounter (HOSPITAL_COMMUNITY)
Admission: RE | Disposition: A | Discharge status: Home / Self Care | Payer: Self-pay | Source: Ambulatory Visit | Attending: Specialist

## 2015-12-15 ENCOUNTER — Encounter (HOSPITAL_COMMUNITY): Payer: Self-pay | Admitting: *Deleted

## 2015-12-15 ENCOUNTER — Ambulatory Visit (HOSPITAL_COMMUNITY): Payer: Managed Care, Other (non HMO) | Admitting: Anesthesiology

## 2015-12-15 DIAGNOSIS — Z87891 Personal history of nicotine dependence: Secondary | ICD-10-CM | POA: Diagnosis not present

## 2015-12-15 DIAGNOSIS — Z981 Arthrodesis status: Secondary | ICD-10-CM | POA: Diagnosis not present

## 2015-12-15 DIAGNOSIS — M5126 Other intervertebral disc displacement, lumbar region: Secondary | ICD-10-CM

## 2015-12-15 DIAGNOSIS — M549 Dorsalgia, unspecified: Secondary | ICD-10-CM

## 2015-12-15 DIAGNOSIS — M4806 Spinal stenosis, lumbar region: Principal | ICD-10-CM | POA: Insufficient documentation

## 2015-12-15 DIAGNOSIS — M5441 Lumbago with sciatica, right side: Secondary | ICD-10-CM

## 2015-12-15 HISTORY — PX: LUMBAR LAMINECTOMY/DECOMPRESSION MICRODISCECTOMY: SHX5026

## 2015-12-15 LAB — TYPE AND SCREEN
ABO/RH(D): A POS
Antibody Screen: NEGATIVE

## 2015-12-15 SURGERY — LUMBAR LAMINECTOMY/DECOMPRESSION MICRODISCECTOMY 1 LEVEL
Anesthesia: General | Site: Back | Laterality: Right

## 2015-12-15 MED ORDER — ACETAMINOPHEN 325 MG PO TABS: 650.0000 mg | ORAL_TABLET | ORAL | Status: DC | PRN

## 2015-12-15 MED ORDER — OXYCODONE-ACETAMINOPHEN 5-325 MG PO TABS: 1.0000 | ORAL_TABLET | ORAL | Status: DC | PRN

## 2015-12-15 MED ORDER — HYDROMORPHONE HCL 1 MG/ML IJ SOLN
INTRAMUSCULAR | Status: AC
  Filled 2015-12-15: qty 1

## 2015-12-15 MED ORDER — DIAZEPAM 2 MG PO TABS
10.0000 mg | ORAL_TABLET | Freq: Three times a day (TID) | ORAL | Status: DC | PRN
  Administered 2015-12-15 – 2015-12-16 (×2): 10 mg via ORAL
  Filled 2015-12-15 (×2): qty 5

## 2015-12-15 MED ORDER — HYDROMORPHONE HCL 2 MG/ML IJ SOLN
INTRAMUSCULAR | Status: AC
  Filled 2015-12-15: qty 1

## 2015-12-15 MED ORDER — CEFAZOLIN SODIUM-DEXTROSE 2-4 GM/100ML-% IV SOLN
INTRAVENOUS | Status: AC
  Filled 2015-12-15: qty 100

## 2015-12-15 MED ORDER — THROMBIN 5000 UNITS EX SOLR
CUTANEOUS | Status: AC
  Filled 2015-12-15: qty 10000

## 2015-12-15 MED ORDER — ROCURONIUM BROMIDE 50 MG/5ML IV SOLN
INTRAVENOUS | Status: AC
Start: 2015-12-15 — End: 2015-12-15
  Filled 2015-12-15: qty 1

## 2015-12-15 MED ORDER — SODIUM CHLORIDE 0.9% FLUSH: 3.0000 mL | INTRAVENOUS | Status: DC | PRN

## 2015-12-15 MED ORDER — HEMOSTATIC AGENTS (NO CHARGE) OPTIME
TOPICAL | Status: DC | PRN
  Administered 2015-12-15: 1

## 2015-12-15 MED ORDER — DEXAMETHASONE SODIUM PHOSPHATE 10 MG/ML IJ SOLN
INTRAMUSCULAR | Status: DC | PRN
  Administered 2015-12-15: 10 mg via INTRAVENOUS

## 2015-12-15 MED ORDER — HYDROMORPHONE HCL 1 MG/ML IJ SOLN
0.5000 mg | INTRAMUSCULAR | Status: DC | PRN
  Administered 2015-12-15 (×2): 1 mg via INTRAVENOUS
  Filled 2015-12-15 (×2): qty 1

## 2015-12-15 MED ORDER — EPHEDRINE SULFATE 50 MG/ML IJ SOLN
INTRAMUSCULAR | Status: AC
Start: 2015-12-15 — End: 2015-12-15
  Filled 2015-12-15: qty 1

## 2015-12-15 MED ORDER — PROPOFOL 10 MG/ML IV BOLUS
INTRAVENOUS | Status: AC
  Filled 2015-12-15: qty 20

## 2015-12-15 MED ORDER — SUGAMMADEX SODIUM 200 MG/2ML IV SOLN
INTRAVENOUS | Status: DC | PRN
  Administered 2015-12-15: 140 mg via INTRAVENOUS

## 2015-12-15 MED ORDER — SODIUM CHLORIDE 0.9 % IR SOLN
Status: DC | PRN
  Administered 2015-12-15: 500 mL

## 2015-12-15 MED ORDER — LACTATED RINGERS IV SOLN: INTRAVENOUS | Status: DC

## 2015-12-15 MED ORDER — METHOCARBAMOL 1000 MG/10ML IJ SOLN
500.0000 mg | Freq: Four times a day (QID) | INTRAVENOUS | Status: DC | PRN
  Administered 2015-12-15: 500 mg via INTRAVENOUS
  Filled 2015-12-15: qty 550
  Filled 2015-12-15: qty 5

## 2015-12-15 MED ORDER — BUPIVACAINE-EPINEPHRINE 0.5% -1:200000 IJ SOLN
INTRAMUSCULAR | Status: DC | PRN
  Administered 2015-12-15: 9 mL

## 2015-12-15 MED ORDER — SODIUM CHLORIDE 0.9 % IV SOLN: 250.0000 mL | INTRAVENOUS | Status: DC

## 2015-12-15 MED ORDER — MELOXICAM 15 MG PO TABS: 15.0000 mg | ORAL_TABLET | Freq: Every day | ORAL | Status: DC

## 2015-12-15 MED ORDER — LACTATED RINGERS IV SOLN
INTRAVENOUS | Status: DC
  Administered 2015-12-15 (×2): via INTRAVENOUS

## 2015-12-15 MED ORDER — SUFENTANIL CITRATE 50 MCG/ML IV SOLN
INTRAVENOUS | Status: AC
  Filled 2015-12-15: qty 1

## 2015-12-15 MED ORDER — PROPOFOL 10 MG/ML IV BOLUS
INTRAVENOUS | Status: DC | PRN
  Administered 2015-12-15: 110 mg via INTRAVENOUS

## 2015-12-15 MED ORDER — BUPIVACAINE-EPINEPHRINE (PF) 0.25% -1:200000 IJ SOLN
INTRAMUSCULAR | Status: AC
Start: 2015-12-15 — End: 2015-12-15
  Filled 2015-12-15: qty 30

## 2015-12-15 MED ORDER — POLYETHYLENE GLYCOL 3350 17 G PO PACK
17.0000 g | PACK | Freq: Every day | ORAL | Status: DC | PRN
  Administered 2015-12-15: 17 g via ORAL
  Filled 2015-12-15: qty 1

## 2015-12-15 MED ORDER — METHOCARBAMOL 500 MG PO TABS
500.0000 mg | ORAL_TABLET | Freq: Four times a day (QID) | ORAL | Status: DC | PRN
  Administered 2015-12-15 – 2015-12-16 (×2): 500 mg via ORAL
  Filled 2015-12-15 (×2): qty 1

## 2015-12-15 MED ORDER — ONDANSETRON HCL 4 MG/2ML IJ SOLN
INTRAMUSCULAR | Status: DC | PRN
  Administered 2015-12-15: 4 mg via INTRAVENOUS

## 2015-12-15 MED ORDER — TRAMADOL HCL 50 MG PO TABS: 50.0000 mg | ORAL_TABLET | Freq: Three times a day (TID) | ORAL | Status: DC | PRN

## 2015-12-15 MED ORDER — HYDROMORPHONE HCL 1 MG/ML IJ SOLN
0.2500 mg | INTRAMUSCULAR | Status: DC | PRN
  Administered 2015-12-15: 0.5 mg via INTRAVENOUS
  Administered 2015-12-15: 0.25 mg via INTRAVENOUS
  Administered 2015-12-15 (×2): 0.5 mg via INTRAVENOUS
  Administered 2015-12-15: 0.25 mg via INTRAVENOUS

## 2015-12-15 MED ORDER — SUGAMMADEX SODIUM 200 MG/2ML IV SOLN
INTRAVENOUS | Status: AC
Start: 2015-12-15 — End: 2015-12-15
  Filled 2015-12-15: qty 2

## 2015-12-15 MED ORDER — MIDAZOLAM HCL 2 MG/2ML IJ SOLN
INTRAMUSCULAR | Status: AC
  Filled 2015-12-15: qty 2

## 2015-12-15 MED ORDER — LIDOCAINE HCL (CARDIAC) 20 MG/ML IV SOLN
INTRAVENOUS | Status: DC | PRN
  Administered 2015-12-15: 100 mg via INTRAVENOUS

## 2015-12-15 MED ORDER — LIDOCAINE HCL (CARDIAC) 20 MG/ML IV SOLN
INTRAVENOUS | Status: AC
Start: 2015-12-15 — End: 2015-12-15
  Filled 2015-12-15: qty 5

## 2015-12-15 MED ORDER — METHOCARBAMOL 500 MG PO TABS: 500.0000 mg | ORAL_TABLET | Freq: Four times a day (QID) | ORAL | Status: DC

## 2015-12-15 MED ORDER — METHYLPHENIDATE HCL ER (LA) 10 MG PO CP24
40.0000 mg | ORAL_CAPSULE | Freq: Every day | ORAL | Status: DC
  Administered 2015-12-16: 40 mg via ORAL

## 2015-12-15 MED ORDER — SUFENTANIL CITRATE 50 MCG/ML IV SOLN
INTRAVENOUS | Status: DC | PRN
  Administered 2015-12-15: 10 ug via INTRAVENOUS
  Administered 2015-12-15: 5 ug via INTRAVENOUS
  Administered 2015-12-15 (×2): 10 ug via INTRAVENOUS
  Administered 2015-12-15: 15 ug via INTRAVENOUS

## 2015-12-15 MED ORDER — POTASSIUM CHLORIDE IN NACL 20-0.45 MEQ/L-% IV SOLN
INTRAVENOUS | Status: DC
  Filled 2015-12-15 (×2): qty 1000

## 2015-12-15 MED ORDER — ROCURONIUM BROMIDE 100 MG/10ML IV SOLN
INTRAVENOUS | Status: DC | PRN
  Administered 2015-12-15: 10 mg via INTRAVENOUS
  Administered 2015-12-15: 5 mg via INTRAVENOUS
  Administered 2015-12-15: 40 mg via INTRAVENOUS
  Administered 2015-12-15: 10 mg via INTRAVENOUS

## 2015-12-15 MED ORDER — SODIUM CHLORIDE 0.9 % IR SOLN
Status: AC
  Filled 2015-12-15: qty 1

## 2015-12-15 MED ORDER — DOCUSATE SODIUM 100 MG PO CAPS
100.0000 mg | ORAL_CAPSULE | Freq: Two times a day (BID) | ORAL | Status: DC
  Administered 2015-12-15 – 2015-12-16 (×2): 100 mg via ORAL
  Filled 2015-12-15 (×2): qty 1

## 2015-12-15 MED ORDER — MIDAZOLAM HCL 2 MG/2ML IJ SOLN
INTRAMUSCULAR | Status: DC | PRN
  Administered 2015-12-15: 2 mg via INTRAVENOUS

## 2015-12-15 MED ORDER — MAGNESIUM CITRATE PO SOLN: 1.0000 | Freq: Once | ORAL | Status: DC | PRN

## 2015-12-15 MED ORDER — DOCUSATE SODIUM 100 MG PO CAPS: 100.0000 mg | ORAL_CAPSULE | Freq: Two times a day (BID) | ORAL | Status: DC

## 2015-12-15 MED ORDER — ZOLPIDEM TARTRATE 5 MG PO TABS
5.0000 mg | ORAL_TABLET | Freq: Every evening | ORAL | Status: DC | PRN
  Administered 2015-12-15: 5 mg via ORAL
  Filled 2015-12-15: qty 1

## 2015-12-15 MED ORDER — BUPIVACAINE-EPINEPHRINE (PF) 0.5% -1:200000 IJ SOLN
INTRAMUSCULAR | Status: AC
  Filled 2015-12-15: qty 30

## 2015-12-15 MED ORDER — ACETAMINOPHEN 650 MG RE SUPP
650.0000 mg | RECTAL | Status: DC | PRN
Start: 2015-12-15 — End: 2015-12-16

## 2015-12-15 MED ORDER — CEFAZOLIN SODIUM-DEXTROSE 2-4 GM/100ML-% IV SOLN
2.0000 g | Freq: Three times a day (TID) | INTRAVENOUS | Status: AC
  Administered 2015-12-15 – 2015-12-16 (×2): 2 g via INTRAVENOUS
  Filled 2015-12-15 (×2): qty 100

## 2015-12-15 MED ORDER — ONDANSETRON HCL 4 MG/2ML IJ SOLN: 4.0000 mg | INTRAMUSCULAR | Status: DC | PRN

## 2015-12-15 MED ORDER — ONDANSETRON HCL 4 MG/2ML IJ SOLN
INTRAMUSCULAR | Status: AC
  Filled 2015-12-15: qty 2

## 2015-12-15 MED ORDER — DEXAMETHASONE SODIUM PHOSPHATE 10 MG/ML IJ SOLN
INTRAMUSCULAR | Status: AC
Start: 2015-12-15 — End: 2015-12-15
  Filled 2015-12-15: qty 1

## 2015-12-15 MED ORDER — SODIUM CHLORIDE 0.9 % IJ SOLN
INTRAMUSCULAR | Status: AC
  Filled 2015-12-15: qty 10

## 2015-12-15 MED ORDER — CEFAZOLIN SODIUM-DEXTROSE 2-4 GM/100ML-% IV SOLN
2.0000 g | INTRAVENOUS | Status: AC
  Administered 2015-12-15: 2 g via INTRAVENOUS
  Filled 2015-12-15: qty 100

## 2015-12-15 MED ORDER — MENTHOL 3 MG MT LOZG: 1.0000 | LOZENGE | OROMUCOSAL | Status: DC | PRN

## 2015-12-15 MED ORDER — VALACYCLOVIR HCL 500 MG PO TABS
1000.0000 mg | ORAL_TABLET | Freq: Three times a day (TID) | ORAL | Status: DC | PRN
  Filled 2015-12-15: qty 2

## 2015-12-15 MED ORDER — HYDROCODONE-ACETAMINOPHEN 5-325 MG PO TABS
1.0000 | ORAL_TABLET | ORAL | Status: DC | PRN
  Administered 2015-12-15 – 2015-12-16 (×3): 2 via ORAL
  Filled 2015-12-15 (×3): qty 2

## 2015-12-15 MED ORDER — SORBITOL 70 % SOLN
30.0000 mL | Freq: Every day | Status: DC | PRN
  Filled 2015-12-15: qty 30

## 2015-12-15 MED ORDER — SODIUM CHLORIDE 0.9% FLUSH
3.0000 mL | Freq: Two times a day (BID) | INTRAVENOUS | Status: DC
  Administered 2015-12-15 – 2015-12-16 (×2): 3 mL via INTRAVENOUS

## 2015-12-15 MED ORDER — PHENOL 1.4 % MT LIQD
1.0000 | OROMUCOSAL | Status: DC | PRN
  Filled 2015-12-15: qty 177

## 2015-12-15 SURGICAL SUPPLY — 50 items
BAG ZIPLOCK 12X15 (MISCELLANEOUS) IMPLANT
CLEANER TIP ELECTROSURG 2X2 (MISCELLANEOUS) ×3 IMPLANT
CLOSURE WOUND 1/2 X4 (GAUZE/BANDAGES/DRESSINGS) ×1
CLOTH 2% CHLOROHEXIDINE 3PK (PERSONAL CARE ITEMS) ×3 IMPLANT
DRAPE MICROSCOPE LEICA (MISCELLANEOUS) ×3 IMPLANT
DRAPE POUCH INSTRU U-SHP 10X18 (DRAPES) ×3 IMPLANT
DRAPE SHEET LG 3/4 BI-LAMINATE (DRAPES) ×3 IMPLANT
DRAPE SURG 17X11 SM STRL (DRAPES) ×3 IMPLANT
DRAPE UTILITY XL STRL (DRAPES) ×3 IMPLANT
DRSG AQUACEL AG ADV 3.5X 4 (GAUZE/BANDAGES/DRESSINGS) ×3 IMPLANT
DRSG AQUACEL AG ADV 3.5X 6 (GAUZE/BANDAGES/DRESSINGS) IMPLANT
DURAPREP 26ML APPLICATOR (WOUND CARE) ×3 IMPLANT
DURASEAL SPINE SEALANT 3ML (MISCELLANEOUS) IMPLANT
ELECT BLADE TIP CTD 4 INCH (ELECTRODE) ×3 IMPLANT
ELECT REM PT RETURN 9FT ADLT (ELECTROSURGICAL) ×3
ELECTRODE REM PT RTRN 9FT ADLT (ELECTROSURGICAL) ×1 IMPLANT
FLOSEAL 10ML (HEMOSTASIS) ×3 IMPLANT
GLOVE BIOGEL PI IND STRL 7.0 (GLOVE) ×1 IMPLANT
GLOVE BIOGEL PI INDICATOR 7.0 (GLOVE) ×2
GLOVE SURG SS PI 7.0 STRL IVOR (GLOVE) ×3 IMPLANT
GLOVE SURG SS PI 7.5 STRL IVOR (GLOVE) ×3 IMPLANT
GLOVE SURG SS PI 8.0 STRL IVOR (GLOVE) ×3 IMPLANT
GOWN STRL REUS W/TWL XL LVL3 (GOWN DISPOSABLE) ×6 IMPLANT
HEMOSTAT SPONGE AVITENE ULTRA (HEMOSTASIS) ×3 IMPLANT
IV CATH 14GX2 1/4 (CATHETERS) ×3 IMPLANT
KIT BASIN OR (CUSTOM PROCEDURE TRAY) ×3 IMPLANT
KIT POSITIONING SURG ANDREWS (MISCELLANEOUS) ×3 IMPLANT
MANIFOLD NEPTUNE II (INSTRUMENTS) ×3 IMPLANT
NEEDLE SPNL 18GX3.5 QUINCKE PK (NEEDLE) ×6 IMPLANT
PACK LAMINECTOMY ORTHO (CUSTOM PROCEDURE TRAY) ×3 IMPLANT
PAD TELFA 2X3 NADH STRL (GAUZE/BANDAGES/DRESSINGS) ×3 IMPLANT
PATTIES SURGICAL .5 X.5 (GAUZE/BANDAGES/DRESSINGS) ×3 IMPLANT
PATTIES SURGICAL .75X.75 (GAUZE/BANDAGES/DRESSINGS) IMPLANT
PATTIES SURGICAL 1X1 (DISPOSABLE) IMPLANT
RUBBERBAND STERILE (MISCELLANEOUS) ×6 IMPLANT
SPONGE SURGIFOAM ABS GEL 100 (HEMOSTASIS) IMPLANT
STAPLER VISISTAT (STAPLE) IMPLANT
STRIP CLOSURE SKIN 1/2X4 (GAUZE/BANDAGES/DRESSINGS) ×2 IMPLANT
SUT NURALON 4 0 TR CR/8 (SUTURE) IMPLANT
SUT PROLENE 3 0 PS 2 (SUTURE) ×3 IMPLANT
SUT VIC AB 1 CT1 27 (SUTURE)
SUT VIC AB 1 CT1 27XBRD ANTBC (SUTURE) IMPLANT
SUT VIC AB 1-0 CT2 27 (SUTURE) ×3 IMPLANT
SUT VIC AB 2-0 CT1 27 (SUTURE)
SUT VIC AB 2-0 CT1 TAPERPNT 27 (SUTURE) IMPLANT
SUT VIC AB 2-0 CT2 27 (SUTURE) ×3 IMPLANT
SYR 3ML LL SCALE MARK (SYRINGE) ×3 IMPLANT
TOWEL OR 17X26 10 PK STRL BLUE (TOWEL DISPOSABLE) ×3 IMPLANT
TOWEL OR NON WOVEN STRL DISP B (DISPOSABLE) ×3 IMPLANT
YANKAUER SUCT BULB TIP NO VENT (SUCTIONS) ×3 IMPLANT

## 2015-12-15 NOTE — Interval H&P Note (Signed)
History and Physical Interval Note:  12/15/2015 7:07 AM  [name]  has presented today for surgery, with the diagnosis of HNP L4-5  The various methods of treatment have been discussed with the patient and family. After consideration of risks, benefits and other options for treatment, the patient has consented to  Procedure(s):  MICO LUMBER DECOMPRESSION L4-5 ON THE RIGHT (Right) as a surgical intervention .  The patient's history has been reviewed, patient examined, no change in status, stable for surgery.  I have reviewed the patient's chart and labs.  Questions were answered to the patient's satisfaction.     [name] C

## 2015-12-15 NOTE — Discharge Instructions (Signed)

## 2015-12-15 NOTE — Anesthesia Postprocedure Evaluation (Signed)
Anesthesia Post Note  Patient: [name]  Procedure(s) Performed: Procedure(s) (LRB):  MICO LUMBER DECOMPRESSION L4-5 ON THE RIGHT,  (Right)  Patient location during evaluation: PACU Anesthesia Type: General Level of consciousness: awake and alert Pain management: pain level controlled Vital Signs Assessment: post-procedure vital signs reviewed and stable Respiratory status: spontaneous breathing, nonlabored ventilation, respiratory function stable and patient connected to nasal cannula oxygen Cardiovascular status: blood pressure returned to baseline and stable Postop Assessment: no signs of nausea or vomiting Anesthetic complications: no    Last Vitals:  Filed Vitals:   12/15/15 1145 12/15/15 1200  BP: 115/72 117/78  Pulse: 85 90  Temp:  36.7 C  Resp: 10 12    Last Pain:  Filed Vitals:   12/15/15 1215  PainSc: 7                  Peyten Punches,W. EDMOND

## 2015-12-15 NOTE — H&P (View-Only) (Signed)
[name] is an 40 y.o. female.   Chief Complaint: back and RLE pain HPI: The patient is a 40 year old female who presents today for follow up of their back. The patient is being followed for their low back symptoms. They are now 9 day(s) out from a flare up. The patient is 4 years, 1 months out from an ALIF. Symptoms reported today include: pain. The patient states that they are doing poorly. Current treatment includes: relative rest, activity modification, pain medications and Prednisone. The following medication has been used for pain control: Ultram and Norco. The patient presents today following MRI (unable to complete the MRI).   Prior to back surgery, she had only been having back pain and was not having any radicular symptoms from her disk degeneration at L5-S1. She reports new onset of symptoms for a week and a half. Denies any specific injury or change in activity, however, started experiencing right-sided lower back pain, which progressively worsened throughout the last week and is now radiating down not only into the buttock, but down the anterior thigh into the anterior lower leg with numbness in the anterior lower leg and tingling into the dorsal aspect of the foot, which the tingling in the foot seems to be intermittent. She has not noticed any weakness. Over the last few days, she has been taking some anti-inflammatories that does seem to make the pain in her back less sharp; however, she is unable to sit or stand comfortably her most comfortable position, which is not pain free as lying on her left side in the fetal position with a pillow between her knees. She is taking tramadol for pain. Does not want to take narcotic pain medications at this point as she had been taking them extensively prior to her back surgery. She is also taking Valium as needed for spasms, which does seem to help. She had been also having spasms in the right thigh.  Her pain is worse with flexion. She and her  family had initially been planning on going on a [employer] this coming weekend.  [name] follows up, unable to tolerate her MRI, unable to lay in a certain position. So, they had to discontinue. They had a partial MRI. She is here with her father, who is a physician  Past Medical History  Diagnosis Date  . GERD (gastroesophageal reflux disease)   . 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 Back pain   . Hypotension     per pt.   . Recurrent upper respiratory infection (URI)     viral infection - URI- 10/2011, penicillin treatmernt   . Kidney infection     1998- hosp.- [facility]  . 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 Anxiety     h/o panic attack, due to cysts in axilla area & requiring I&D, last episode, 2011,     Past Surgical History  Procedure Laterality Date  . Cesarean section      2007- /w epidural  anesthesia   . Colonoscopy    . Wisdom tooth extraction      young adult  . Anterior lumbar fusion  11/14/2011    Procedure: ANTERIOR LUMBAR FUSION 2 LEVELS;  Surgeon: Johnn Hai, MD;  Location: [facility];  Service: Orthopedics;  Laterality: N/A;  ALIF L5-S1  . Spine surgery      fusion    Family History  Problem Relation Age of Onset  . Lupus Mother   .  Anesthesia problems Neg Hx    Social History:  reports that she quit smoking about 4 years ago. Her smoking use included Cigarettes. She has a 5 pack-year smoking history. She has never used smokeless tobacco. She reports that she drinks alcohol. She reports that she does not use illicit drugs.  Allergies:  Allergies  Allergen Reactions  . Tamiflu [Oseltamivir Phosphate] Other (See Comments)    Seizure  . Ciprofloxacin Diarrhea and Nausea And Vomiting  . Metoclopramide Hcl Other (See Comments)    "lock jaw"  . Sulfa Antibiotics Nausea And Vomiting     (Not in a hospital admission)  No results found for this or any previous visit (from the past 48 hour(s)). No results found.  Review of Systems  Constitutional: Negative.    HENT: Negative.   Respiratory: Negative.   Gastrointestinal: Negative.   Genitourinary: Negative.   Musculoskeletal: Positive for back pain.  Skin: Negative.   Neurological: Positive for sensory change and focal weakness.    There were no vitals taken for this visit. Physical Exam  Constitutional: She is oriented to person, place, and time. She appears well-developed. She appears distressed.  HENT:  Head: Normocephalic.  Eyes: Pupils are equal, round, and reactive to light.  Neck: Normal range of motion.  Cardiovascular: Normal rate.   Respiratory: Effort normal.  GI: Soft.  Musculoskeletal:  On examination, she is in severe distress. She is lying down. She is crying. She has numbness on the anterior aspect of the tibia. She was unable to tolerate laying down on her side.  Again, in severe distress. Mood and affect is anxious. She has decreased sensation of anterior tibia, although she is able to dorsiflex and plantar flex the foot and has good EHL and tibialis anterior. Straight leg raise is positive.  Neurological: She is oriented to person, place, and time.    Initial review of the MRI, incomplete study, the T2s are performed. It does appear that she has disc degeneration of L4-L5 and what may be a foraminal disc herniation at L4-L5 to the right. There is motion artifact on the axials.  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 The fusion construct actually looks good.  I called down the MRI facility. We are going to try different options in terms of obtaining the MRI. We can just focus and prioritize L4-L5, especially the axial images. I gave her a note such that she should not travel, continue on the prednisone. She had an injection of Norco. She will take a couple of pain medicines, so she can better tolerate the MRI. We will obtain a stat reading on that for her, and we will proceed accordingly. She may  require a discectomy at L4-L5 given the severity of her symptoms.  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 Dr. Tonita Cong called pt and her father to discuss. Due to significant ongoing pain, weakness, neural tension signs, Dr. Tonita Cong and the patient mutually agreed to proceed with microlumbar decompression L4-5 right. We discussed the procedure itself as well as risks, complications and alternatives including but not limited to DVT, PE, failure of procedure, need for secondary procedure, anesthesia risk, even death. Discussed post-op protocols. She will follow up 10-14 days post-op for suture removal and call with any questions or concerns.  Plan microlumbar decompression L4-5 right  Cecilie Kicks., PA-C for Dr. Tonita Cong 12/13/2015, 8:38 AM

## 2015-12-15 NOTE — Transfer of Care (Signed)
Immediate Anesthesia Transfer of Care Note  Patient: [name]  Procedure(s) Performed: Procedure(s):  MICO LUMBER DECOMPRESSION L4-5 ON THE RIGHT,  (Right)  Patient Location: PACU  Anesthesia Type:General  Level of Consciousness: awake, alert  and oriented  Airway & Oxygen Therapy: Patient Spontanous Breathing and Patient connected to face mask oxygen  Post-op Assessment: Report given to RN and Post -op Vital signs reviewed and stable  Post vital signs: Reviewed and stable  Last Vitals:  Filed Vitals:   12/15/15 0740  BP: 126/92  Pulse: 81  Temp: 37.2 C  Resp: 16    Last Pain:  Filed Vitals:   12/15/15 0818  PainSc: 5       Patients Stated Pain Goal: 3 ([id number])  Complications: No apparent anesthesia complications

## 2015-12-15 NOTE — Brief Op Note (Signed)
12/15/2015  11:17 AM  PATIENT:  [name]  40 y.o. female  PRE-OPERATIVE DIAGNOSIS:  HNP L4-5  POST-OPERATIVE DIAGNOSIS:  HNP L4-5  PROCEDURE:  Procedure(s):  MICO LUMBER DECOMPRESSION L4-5 ON THE RIGHT,  (Right)  SURGEON:  Surgeon(s) and Role:    * Susa Day, MD - Primary  PHYSICIAN ASSISTANT:   ASSISTANTS: Bissell   ANESTHESIA:   general  EBL:  Total I/O In: 1000 [I.V.:1000] Out: 100 [Blood:100]  BLOOD ADMINISTERED:none  DRAINS: none   LOCAL MEDICATIONS USED:  MARCAINE     SPECIMEN:  Source of Specimen:  L45  DISPOSITION OF SPECIMEN:  PATHOLOGY  COUNTS:  YES  TOURNIQUET:  * No tourniquets in log *  DICTATION: .Other Dictation: Dictation Number [id number]  PLAN OF CARE: Admit for overnight observation  PATIENT DISPOSITION:  PACU - hemodynamically stable.   Delay start of Pharmacological VTE agent (>24hrs) due to surgical blood loss or risk of bleeding: yes

## 2015-12-15 NOTE — Anesthesia Preprocedure Evaluation (Addendum)
Anesthesia Evaluation  Patient identified by MRN, date of birth, ID band Patient awake    Reviewed: Allergy & Precautions, H&P , NPO status , Patient's Chart, lab work & pertinent test results  Airway Mallampati: II  TM Distance: >3 FB Neck ROM: Full    Dental no notable dental hx. (+) Teeth Intact, Dental Advisory Given   Pulmonary Current Smoker,    Pulmonary exam normal breath sounds clear to auscultation       Cardiovascular negative cardio ROS   Rhythm:Regular Rate:Normal     Neuro/Psych Anxiety negative neurological ROS     GI/Hepatic negative GI ROS, Neg liver ROS,   Endo/Other  negative endocrine ROS  Renal/GU negative Renal ROS  negative genitourinary   Musculoskeletal  (+) Arthritis , Osteoarthritis,    Abdominal   Peds  Hematology negative hematology ROS (+)   Anesthesia Other Findings   Reproductive/Obstetrics negative OB ROS                            Anesthesia Physical Anesthesia Plan  ASA: II  Anesthesia Plan: General   Post-op Pain Management:    Induction: Intravenous  Airway Management Planned: Oral ETT  Additional Equipment:   Intra-op Plan:   Post-operative Plan: Extubation in OR  Informed Consent: I have reviewed the patients History and Physical, chart, labs and discussed the procedure including the risks, benefits and alternatives for the proposed anesthesia with the patient or authorized representative who has indicated his/her understanding and acceptance.   Dental advisory given  Plan Discussed with: CRNA  Anesthesia Plan Comments:         Anesthesia Quick Evaluation

## 2015-12-15 NOTE — Anesthesia Procedure Notes (Signed)
Procedure Name: Intubation Date/Time: 12/15/2015 9:07 AM Performed by: Danley Danker L Patient Re-evaluated:Patient Re-evaluated prior to inductionOxygen Delivery Method: Circle system utilized Preoxygenation: Pre-oxygenation with 100% oxygen Intubation Type: IV induction Ventilation: Mask ventilation without difficulty and Oral airway inserted - appropriate to patient size Laryngoscope Size: Sabra Heck and 2 Grade View: Grade I Tube type: Oral Tube size: 7.5 mm Number of attempts: 1 Airway Equipment and Method: Stylet Placement Confirmation: ETT inserted through vocal cords under direct vision,  breath sounds checked- equal and bilateral and positive ETCO2 Secured at: 20 cm Tube secured with: Tape Dental Injury: Teeth and Oropharynx as per pre-operative assessment

## 2015-12-16 DIAGNOSIS — M4806 Spinal stenosis, lumbar region: Secondary | ICD-10-CM | POA: Diagnosis not present

## 2015-12-16 LAB — CBC
HEMATOCRIT: 35.9 % — AB (ref 36.0–46.0)
Hemoglobin: 12 g/dL (ref 12.0–15.0)
MCH: 32.1 pg (ref 26.0–34.0)
MCHC: 33.4 g/dL (ref 30.0–36.0)
MCV: 96 fL (ref 78.0–100.0)
PLATELETS: 277 10*3/uL (ref 150–400)
RBC: 3.74 MIL/uL — ABNORMAL LOW (ref 3.87–5.11)
RDW: 13.2 % (ref 11.5–15.5)
WBC: 13.5 10*3/uL — AB (ref 4.0–10.5)

## 2015-12-16 LAB — BASIC METABOLIC PANEL
ANION GAP: 11 (ref 5–15)
BUN: 11 mg/dL (ref 6–20)
CO2: 29 mmol/L (ref 22–32)
CREATININE: 0.78 mg/dL (ref 0.44–1.00)
Calcium: 9.2 mg/dL (ref 8.9–10.3)
Chloride: 103 mmol/L (ref 101–111)
GFR calc non Af Amer: >60 mL/min (ref >60)
Glucose, Bld: 142 mg/dL — ABNORMAL HIGH (ref 65–99)
Potassium: 4.6 mmol/L (ref 3.5–5.1)
SODIUM: 143 mmol/L (ref 135–145)

## 2015-12-16 MED ORDER — MELOXICAM 15 MG PO TABS
15.0000 mg | ORAL_TABLET | Freq: Every day | ORAL | Status: DC
Start: 2015-12-16 — End: 2016-01-03

## 2015-12-16 NOTE — Progress Notes (Signed)
Subjective: 1 Day Post-Op Procedure(s) (LRB):  MICO LUMBER DECOMPRESSION L4-5 ON THE RIGHT,  (Right) Patient reports pain as mild.  Reports incisional pain, well controlled with meds. No leg pain. Still some leg numbness but less severe and less distal than pre-op. No other c/o. Voiding without difficulty. Feels ready for D/C home today.  Objective: Vital signs in last 24 hours: Temp:  [97.3 F (36.3 C)-99 F (37.2 C)] 97.9 F (36.6 C) (05/12 0551) Pulse Rate:  [75-94] 75 (05/12 0551) Resp:  [10-23] 16 (05/12 0551) BP: (95-125)/(69-84) 125/84 mmHg (05/12 0551) SpO2:  [96 %-100 %] 98 % (05/12 0551) Weight:  [70.988 kg (156 lb 8 oz)] 70.988 kg (156 lb 8 oz) (05/11 0816)  Intake/Output from previous day: 05/11 0701 - 05/12 0700 In: 2780 [P.O.:1680; I.V.:1100] Out: 2100 [Urine:2000; Blood:100] Intake/Output this shift:     Recent Labs  12/13/15 1155 12/16/15 0406  HGB 13.5 12.0    Recent Labs  12/13/15 1155 12/16/15 0406  WBC 7.7 13.5*  RBC 4.19 3.74*  HCT 39.8 35.9*  PLT 273 277    Recent Labs  12/13/15 1155 12/16/15 0406  NA 139 143  K 4.1 4.6  CL 104 103  CO2 28 29  BUN 12 11  CREATININE 0.67 0.78  GLUCOSE 109* 142*  CALCIUM 9.3 9.2   No results for input(s): LABPT, INR in the last 72 hours.  Neurologically intact ABD soft Neurovascular intact Sensation intact distally Intact pulses distally Dorsiflexion/Plantar flexion intact Incision: dressing C/D/I and no drainage No cellulitis present Compartment soft no calf pain or sign of DVT  Assessment/Plan: 1 Day Post-Op Procedure(s) (LRB):  MICO LUMBER DECOMPRESSION L4-5 ON THE RIGHT,  (Right) Advance diet Up with therapy D/C IV fluids Reviewed Lspine precautions, dressing instructions, D/C instructions Plan D/C after ambulates with PT Follow up in office in 10-14 days for suture removal Will discuss with Dr. Mliss Fritz, Con[place] Rolls. 12/16/2015, 8:01 AM

## 2015-12-16 NOTE — Evaluation (Signed)
Physical Therapy Evaluation Patient Details Name: [name] MRN: [id number] DOB: 07/23/1976 Today's Date: 12/16/2015   History of Present Illness  40 yo F s/p MICRO LUMBAR DECOMPRESSION L4-5 ON THE RIGHT, (Right)  Clinical Impression  Pt s/p Micro Lumbar decompression presents and with functional mobility limitations 2* post op back precautions and post op pain limiting functional mobility.  Pt plans dc home with family assist.    Follow Up Recommendations No PT follow up    Equipment Recommendations  None recommended by PT    Recommendations for Other Services       Precautions / Restrictions Precautions Precautions: Back Precaution Booklet Issued: Yes (comment) Precaution Comments: reviewed back precautions/handout with patient Restrictions Weight Bearing Restrictions: No Other Position/Activity Restrictions: WBAT      Mobility  Bed Mobility Overal bed mobility: Needs Assistance Bed Mobility: Supine to Sit;Sit to Supine     Supine to sit: Min guard Sit to supine: Min guard   General bed mobility comments: cues for correct log roll technique  Transfers Overall transfer level: Needs assistance Equipment used: None Transfers: Sit to/from Stand Sit to Stand: Supervision         General transfer comment: cues for transition position, use of UEs to self assist and adherence to back precautions  Ambulation/Gait Ambulation/Gait assistance: Min guard;Supervision Ambulation Distance (Feet): 300 Feet Assistive device: None Gait Pattern/deviations: Step-through pattern;Decreased step length - right;Decreased step length - left;Shuffle Gait velocity: mod pace   General Gait Details: No loss of balance with good saftey awareness.  Cues for adherence to back precautions with turns  Stairs            Wheelchair Mobility    Modified Rankin (Stroke Patients Only)       Balance                                              Pertinent Vitals/Pain Pain Assessment: 0-10 Pain Score: 3  Pain Location: back Pain Descriptors / Indicators: Aching;Sore Pain Intervention(s): Limited activity within patient's tolerance;Monitored during session;Premedicated before session;Ice applied    Home Living Family/patient expects to be discharged to:: Private residence Living Arrangements: Spouse/significant other Available Help at Discharge: Family Type of Home: House Home Access: Stairs to enter   Technical brewer of Steps: 1 Home Layout: One level Home Equipment: Shower seat;Hand held shower head Additional Comments: Pt states can borrow RW as needed    Prior Function Level of Independence: Independent               Hand Dominance        Extremity/Trunk Assessment   Upper Extremity Assessment: Overall WFL for tasks assessed           Lower Extremity Assessment: Overall WFL for tasks assessed         Communication   Communication: No difficulties  Cognition Arousal/Alertness: Awake/alert Behavior During Therapy: WFL for tasks assessed/performed Overall Cognitive Status: Within Functional Limits for tasks assessed                      General Comments      Exercises        Assessment/Plan    PT Assessment Patient needs continued PT services  PT Diagnosis Difficulty walking   PT Problem List Decreased mobility;Decreased knowledge of precautions;Pain  PT Treatment Interventions DME instruction;Functional mobility  training;Therapeutic activities;Patient/family education   PT Goals (Current goals can be found in the Care Plan section) Acute Rehab PT Goals Patient Stated Goal: home today PT Goal Formulation: With patient Time For Goal Achievement: 12/17/15 Potential to Achieve Goals: Good    Frequency Min 1X/week   Barriers to discharge        Co-evaluation               End of Session   Activity Tolerance: Patient tolerated treatment well Patient  left: Other (comment) (Sitting EOB waiting for OT) Nurse Communication: Mobility status    Functional Assessment Tool Used: Clinical judgement Functional Limitation: Mobility: Walking and moving around Mobility: Walking and Moving Around Current Status [id number]): At least 1 percent but less than 20 percent impaired, limited or restricted Mobility: Walking and Moving Around Goal Status [phone]): At least 1 percent but less than 20 percent impaired, limited or restricted Mobility: Walking and Moving Around Discharge Status [phone]): At least 1 percent but less than 20 percent impaired, limited or restricted    Time: 0940-1002 PT Time Calculation (min) (ACUTE ONLY): 22 min   Charges:   PT Evaluation $PT Eval Low Complexity: 1 Procedure     PT G Codes:   PT G-Codes **NOT FOR INPATIENT CLASS** Functional Assessment Tool Used: Clinical judgement Functional Limitation: Mobility: Walking and moving around Mobility: Walking and Moving Around Current Status [id number]): At least 1 percent but less than 20 percent impaired, limited or restricted Mobility: Walking and Moving Around Goal Status [phone]): At least 1 percent but less than 20 percent impaired, limited or restricted Mobility: Walking and Moving Around Discharge Status [phone]): At least 1 percent but less than 20 percent impaired, limited or restricted    [facility] 12/16/2015, 12:59 PM

## 2015-12-16 NOTE — Discharge Summary (Signed)
Physician Discharge Summary   Patient ID: EULINE KIMBLER MRN: [id number] DOB/AGE: 1975-09-12 40 y.o.  Admit date: 12/15/2015 Discharge date: 12/16/2015  Primary Diagnosis:   HNP L4-5  Admission Diagnoses:  Past Medical History  Diagnosis Date  . GERD (gastroesophageal reflux disease)   . 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 Back pain   . Hypotension     per pt.   . Recurrent upper respiratory infection (URI)     viral infection - URI- 10/2011, penicillin treatmernt   . Kidney infection     1998- hosp.- [facility]  . 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 Anxiety     h/o panic attack, due to cysts in axilla area & requiring I&D, last episode, 2011,   . Numbness and tingling     right leg   . Seizures (HCC)     side effect of Tamiflu  . Anemia    Discharge Diagnoses:   Principal Problem:   HNP (herniated nucleus pulposus), lumbar  Procedure:  Procedure(s) (LRB):  MICO LUMBER DECOMPRESSION L4-5 ON THE RIGHT,  (Right)   Consults: None  HPI:  see H&P    Laboratory Data: Hospital Outpatient Visit on 12/13/2015  Component Date Value Ref Range Status  . Sodium 12/13/2015 139  135 - 145 mmol/L Final  . Potassium 12/13/2015 4.1  3.5 - 5.1 mmol/L Final  . Chloride 12/13/2015 104  101 - 111 mmol/L Final  . CO2 12/13/2015 28  22 - 32 mmol/L Final  . Glucose, Bld 12/13/2015 109* 65 - 99 mg/dL Final  . BUN 12/13/2015 12  6 - 20 mg/dL Final  . Creatinine, Ser 12/13/2015 0.67  0.44 - 1.00 mg/dL Final  . Calcium 12/13/2015 9.3  8.9 - 10.3 mg/dL Final  . GFR calc non Af Amer 12/13/2015 >60  >60 mL/min Final  . GFR calc Af Amer 12/13/2015 >60  >60 mL/min Final   Comment: (NOTE) The eGFR has been calculated using the CKD EPI equation. This calculation has not been validated in all clinical situations. eGFR's persistently <60 mL/min signify possible Chronic Kidney Disease.   . Anion gap 12/13/2015 7  5 - 15 Final  . WBC 12/13/2015 7.7  4.0 - 10.5 K/uL Final  . RBC 12/13/2015 4.19  3.87 - 5.11  MIL/uL Final  . Hemoglobin 12/13/2015 13.5  12.0 - 15.0 g/dL Final  . HCT 12/13/2015 39.8  36.0 - 46.0 % Final  . MCV 12/13/2015 95.0  78.0 - 100.0 fL Final  . MCH 12/13/2015 32.2  26.0 - 34.0 pg Final  . MCHC 12/13/2015 33.9  30.0 - 36.0 g/dL Final  . RDW 12/13/2015 13.2  11.5 - 15.5 % Final  . Platelets 12/13/2015 273  150 - 400 K/uL Final  . ABO/RH(D) 12/13/2015 A POS   Final  . Antibody Screen 12/13/2015 NEG   Final  . Sample Expiration 12/13/2015 12/18/2015   Final  . Extend sample reason 12/13/2015 NO TRANSFUSIONS OR PREGNANCY IN THE PAST 3 MONTHS   Final  . Preg, Serum 12/13/2015 NEGATIVE  NEGATIVE Final   Comment:        THE SENSITIVITY OF THIS METHODOLOGY IS >10 mIU/mL.   . MRSA, PCR 12/13/2015 NEGATIVE  NEGATIVE Final  . Staphylococcus aureus 12/13/2015 POSITIVE* NEGATIVE Final   Comment:        The Xpert SA Assay (FDA approved for NASAL specimens in patients over 81 years of age), is one component of a comprehensive surveillance program.  Test performance has been validated by [facility] for patients greater than or equal to 109 year old. It is not intended to diagnose infection nor to guide or monitor treatment.   . ABO/RH(D) 12/13/2015 A POS   Final    Recent Labs  12/13/15 1155 12/16/15 0406  HGB 13.5 12.0    Recent Labs  12/13/15 1155 12/16/15 0406  WBC 7.7 13.5*  RBC 4.19 3.74*  HCT 39.8 35.9*  PLT 273 277    Recent Labs  12/13/15 1155 12/16/15 0406  NA 139 143  K 4.1 4.6  CL 104 103  CO2 28 29  BUN 12 11  CREATININE 0.67 0.78  GLUCOSE 109* 142*  CALCIUM 9.3 9.2   No results for input(s): LABPT, INR in the last 72 hours.  X-Rays:Dg Lumbar Spine 2-3 Views  12/13/2015  CLINICAL DATA:  Low back pain, preoperative evaluation for upcoming decompression EXAM: LUMBAR SPINE - 2 VIEW COMPARISON:  11/15/2011 FINDINGS: Five lumbar type vertebral bodies are well visualized. Changes of prior fusion anteriorly at L5-S1 are seen. Disc space  narrowing is noted which has progressed at L4-5. Numbering nomenclature follows that of the prior exam. Fecal material is noted throughout the colon. No other focal abnormality is seen. IMPRESSION: Increasing disc space narrowing at L4-5. Electronically Signed   By: Inez Catalina M.D.   On: 12/13/2015 12:42   Dg Lumbar Spine 1 View  12/15/2015  CLINICAL DATA:  Surgical level L4-5. EXAM: LUMBAR SPINE - 1 VIEW COMPARISON:  Intraoperative views earlier the same date. Radiographs 12/13/2015 FINDINGS: Portable cross-table lateral view 3 at 1016 hours. Skin spreaders are present posteriorly at L4-5. There is a surgical instrument directed towards the posterior inferior corner of the L4 vertebral body. Previous L5-S1 fusion noted. IMPRESSION: L4-5 localization. Electronically Signed   By: Richardean Sale M.D.   On: 12/15/2015 10:32   Dg Lumbar Spine 1 View  12/15/2015  CLINICAL DATA:  Low back pain. EXAM: LUMBAR SPINE - 1 VIEW COMPARISON:  All lumbar spine radiographs 12/13/2015. FINDINGS: Intraoperative portable image 1 demonstrates needles from a posterior approach directed toward the L5 spinous process and S1 spinous process. Previous L5-S1 PLIF. IMPRESSION: L5 and S1 marked. Electronically Signed   By: Staci Righter M.D.   On: 12/15/2015 09:58   Dg Lumbar Spine 1 View  12/15/2015  CLINICAL DATA:  Acute back pain. EXAM: LUMBAR SPINE - 1 VIEW COMPARISON:  Same day.  Dec 13, 2015. FINDINGS: Single intraoperative cross-table lateral projection of lumbar spine was obtained. Status post surgical anterior fusion of L5-S1. Moderate degenerative disc disease is noted at L4-5. Surgical probe is seen projected over the posterior spinous process of L5. IMPRESSION: Surgical localization as described above. Electronically Signed   By: Marijo Conception, M.D.   On: 12/15/2015 09:52    EKG:No orders found for this or any previous visit.   Hospital Course: Patient was admitted to [facility] and taken to the OR and  underwent the above state procedure without complications.  Patient tolerated the procedure well and was later transferred to the recovery room and then to the orthopaedic floor for postoperative care.  They were given PO and IV analgesics for pain control following their surgery.  They were given 24 hours of postoperative antibiotics.   PT was consulted postop to assist with mobility and transfers.  The patient was allowed to be WBAT with therapy and was taught back precautions. Discharge planning was consulted to help with postop  disposition and equipment needs.  Patient had a good night on the evening of surgery and started to get up OOB with therapy on day one. Patient was seen in rounds and was ready to go home on day one.  They were given discharge instructions and dressing directions.  They were instructed on when to follow up in the office with Dr. Tonita Cong.   Diet: Regular diet Activity:WBAT Follow-up:in 10-14 days Disposition - Home Discharged Condition: good   Discharge Instructions    Call MD / Call 911    Complete by:  As directed   If you experience chest pain or shortness of breath, CALL 911 and be transported to the hospital emergency room.  If you develope a fever above 101 F, pus (white drainage) or increased drainage or redness at the wound, or calf pain, call your surgeon's office.     Constipation Prevention    Complete by:  As directed   Drink plenty of fluids.  Prune juice may be helpful.  You may use a stool softener, such as Colace (over the counter) 100 mg twice a day.  Use MiraLax (over the counter) for constipation as needed.     Diet - low sodium heart healthy    Complete by:  As directed      Increase activity slowly as tolerated    Complete by:  As directed             Medication List    STOP taking these medications        HYDROcodone-acetaminophen 5-325 MG tablet  Commonly known as:  NORCO/VICODIN      TAKE these medications        diazepam 10 MG  tablet  Commonly known as:  VALIUM  Take 1 tablet (10 mg total) by mouth every 8 (eight) hours as needed for anxiety.     docusate sodium 100 MG capsule  Commonly known as:  COLACE  Take 1 capsule (100 mg total) by mouth 2 (two) times daily.     levonorgestrel 20 MCG/24HR IUD  Commonly known as:  MIRENA  1 each by Intrauterine route once.     meloxicam 15 MG tablet  Commonly known as:  MOBIC  Take 1 tablet (15 mg total) by mouth daily. Resume 5 days post-op     methocarbamol 500 MG tablet  Commonly known as:  ROBAXIN  Take 1 tablet (500 mg total) by mouth 4 (four) times daily.     methylphenidate 40 MG 24 hr capsule  Commonly known as:  RITALIN LA  Take 1 capsule (40 mg total) by mouth every morning.     oxyCODONE-acetaminophen 5-325 MG tablet  Commonly known as:  ROXICET  Take 1-2 tablets by mouth every 4 (four) hours as needed for severe pain.     SOOLANTRA 1 % Crea  Generic drug:  Ivermectin  Apply 1 application topically daily. Applies daily for rosacea.     traMADol 50 MG tablet  Commonly known as:  ULTRAM  Take 1 tablet (50 mg total) by mouth every 8 (eight) hours as needed.     valACYclovir 1000 MG tablet  Commonly known as:  VALTREX  Take 1 tablet (1,000 mg total) by mouth 3 (three) times daily as needed. For flare ups           Follow-up Information    Follow up with BEANE,JEFFREY C, MD In 2 weeks.   Specialty:  Orthopedic Surgery   Why:  For suture removal  Contact information:   [address] [place] [address] [phone]       Signed: Lacie Draft, PA-C Orthopaedic Surgery 12/16/2015, 8:04 AM

## 2015-12-16 NOTE — Op Note (Signed)
NAME:  [name], GUIDO                  ACCOUNT NO.:  MEDICAL RECORD NO.:  [id number]  LOCATION:                                 FACILITY:  PHYSICIAN:  Susa Day, M.D.    DATE OF BIRTH:  [birth year]  DATE OF PROCEDURE:  12/15/2015 DATE OF DISCHARGE:                              OPERATIVE REPORT   PREOPERATIVE DIAGNOSIS:  L4-5 left foramen with fragment, spinal stenosis.  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  PROCEDURE PERFORMED:  Microlumbar decompression L4-5, right. Foraminotomy at L5.  ANESTHESIA:  General.  ASSISTANT:  Cleophas Dunker, PA.  HISTORY:  This is a 40 year old history of fusion L5-S1 had recently disc herniation of 4, 5 extruded to the right compressing of 4 root. She had dermatomal dysesthesias, myotomal weakness, severe pain with compression of the 4 root.  We had 2 attempts of MRI.  The second attempt demonstrated a foraminal disc herniation compressing the root. She was doing well with her back prior to that.  She did have associated disc degeneration.  We probe with acute myotomal weakness, dermatomal dysesthesias, severe pain and severe neural compression of lumbar decompression 4, 5 posteriorly to remove fragment.  Appropriate risk and benefits were discussed including bleeding, infection, damage to neurovascular structures, DVT, PE, anesthetic complications, no change in symptoms, worsening symptoms, need for fusion in the future etc.  TECHNIQUE:  The patient in supine position, after induction of adequate general anesthesia, 2 g Kefzol, placed prone on the Soper frame.  All bony prominences were well padded.  Lumbar region was prepped and draped in usual sterile fashion.  Two 18-gauge spinal needle was utilized to localize the L4-5 interspace, confirmed with x-ray.  Incision was made from spinous process L4-5.  Subcutaneous tissue was dissected. Electrocautery was utilized to achieve hemostasis.  We  infiltrated the paraspinous musculature with 0.25% Marcaine with epinephrine. McCullough retractor was placed.  Operating microscope was draped and brought on the surgical field after confirmatory radiograph obtained.  A small interlaminar window with straight curette was utilized to detach the ligamentum flavum from the cephalad edge of 5.  Hemilaminotomy of the 4 was performed with a 2 mm Kerrison.  Then a 3 detached the cephalad edge of the ligamentum flavum.  We then placed a __________ beneath ligamentum flavum, identified the 5 root mobilized it medially. Performed a foraminotomy of 5.  We encounter large epidural venous plexus.  We placed a patty over that.  We then identified the disk space.  There was a fragment noted out into the foramen and cephalad. We mobilized it with a micro nerve hook and a micropituitary 3 to smaller extruded fragments.  Much larger fragment was then removed with a micropituitary.  This extended up into the foramen and was up into the L4 foramen.  Following retrieval of the third fragment and decompression, we identified the actual disc space.  There was no residual rupture emanating from the disk space.  A small annular rent was noted.  We examined beneath thecal sac with a Woodson retractor and out the foramen which was widely patent.  There was extensive venous plexus and bleeding was noted from  the epidural veins.  We then packed the laminotomy bed below the disk space with __________ and neural paddies.  Given a period of time, these were then removed and again bleeding was noted.  Visualization was affected by the epidural bleeding.  We then again re-packed the area with neuro patties, cephalad and distally.  By compressing the epidural veins, we used bipolar cautery, multiple areas.  She had appropriate blood pressure.  Following that, we placed __________ there as well as some FloSeal for a period of 5 minutes and weighted with a neuro patty in  that area, we then gently removed that and we felt that hemostasis had therefore been achieve. Due the extensive epidural venous plexus, again we did not re-explored the large fragments that were excised and a neural probe passed freely up the foramen of 5 and 4.  Again, the disk space demonstrated no disk herniation.  There was an osteophytic ridge due to her disk degeneration.  Copiously irrigated with antibiotic irrigation.  No active bleeding or CSF leakage.  We left __________ there at the bottom and evacuated the FloSeal.  Prior to that obtained a confirmatory radiograph at 4-5, removed the [facility] retractor, irrigated the paraspinous musculature, closed the dorsolumbar fascia with 1 Vicryl subcu with skin with Prolene.  Sterile dressing applied.  Placed supine on the hospital bed, extubated without difficulty, and transported to the recovery room in satisfactory condition.  The patient tolerated the procedure well.  No complications.  BLOOD LOSS:  100 mL.     Susa Day, M.D.     Geralynn Rile  D:  12/15/2015  T:  12/16/2015  Job:  [id number]

## 2015-12-16 NOTE — Progress Notes (Signed)
Occupational Therapy Evaluation Patient Details Name: [name] MRN: [id number] DOB: [birth year] Today's Date: 12/16/2015    History of Present Illness 40 yo F s/p MICO LUMBER DECOMPRESSION L4-5 ON THE RIGHT, (Right)   Clinical Impression   All OT education completed and pt questions answered. No further OT needs. Will sign off.    Follow Up Recommendations  No OT follow up;Supervision - Intermittent    Equipment Recommendations  None recommended by OT    Recommendations for Other Services       Precautions / Restrictions Precautions Precautions: Back Precaution Booklet Issued: Yes (comment) Precaution Comments: reviewed back precautions/handout with patient Restrictions Weight Bearing Restrictions: No      Mobility Bed Mobility               General bed mobility comments: sitting EOB  Transfers Overall transfer level: Needs assistance Equipment used: None Transfers: Sit to/from Stand Sit to Stand: Supervision              Balance                                            ADL Overall ADL's : Needs assistance/impaired                                       General ADL Comments: Patient educated on ADL techniques including LB self-care, shower transfer, toilet transfer with back precautions. Patient verbalized understanding of all techniques. Reports she has been toileting without difficulty.      Vision     Perception     Praxis      Pertinent Vitals/Pain Pain Assessment: 0-10 Pain Score: 3  Pain Location: back Pain Descriptors / Indicators: Aching;Sore Pain Intervention(s): Limited activity within patient's tolerance;Monitored during session;Repositioned     Hand Dominance     Extremity/Trunk Assessment Upper Extremity Assessment Upper Extremity Assessment: Overall WFL for tasks assessed   Lower Extremity Assessment Lower Extremity Assessment: Defer to PT evaluation        Communication Communication Communication: No difficulties   Cognition Arousal/Alertness: Awake/alert Behavior During Therapy: WFL for tasks assessed/performed Overall Cognitive Status: Within Functional Limits for tasks assessed                     General Comments       Exercises       Shoulder Instructions      Home Living Family/patient expects to be discharged to:: Private residence Living Arrangements: Spouse/significant other Available Help at Discharge: Family Type of Home: House             Bathroom Shower/Tub: Walk-in Psychologist, prison and probation services: Standard     Home Equipment: Shower seat;Hand held shower head   Additional Comments: will use vanity/shower door on either side of toilet to assist with rise/lower from toilet      Prior Functioning/Environment Level of Independence: Independent             OT Diagnosis: Acute pain   OT Problem List: Decreased activity tolerance;Decreased knowledge of precautions;Pain   OT Treatment/Interventions:      OT Goals(Current goals can be found in the care plan section) Acute Rehab OT Goals Patient Stated Goal: home today OT Goal Formulation: All assessment and education complete, DC  therapy  OT Frequency:     Barriers to D/C:            Co-evaluation              End of Session Nurse Communication: Mobility status  Activity Tolerance: Patient tolerated treatment well Patient left: with call bell/phone within reach;in chair;with family/visitor present   Time: 0955-1007 OT Time Calculation (min): 12 min Charges:  OT General Charges $OT Visit: 1 Procedure OT Evaluation $OT Eval Low Complexity: 1 Procedure G-Codes: OT G-codes **NOT FOR INPATIENT CLASS** Functional Assessment Tool Used: clinical judgment Functional Limitation: Self care Self Care Current Status [id number]): At least 1 percent but less than 20 percent impaired, limited or restricted Self Care Goal Status [id number]): At least  1 percent but less than 20 percent impaired, limited or restricted Self Care Discharge Status [id number]): At least 1 percent but less than 20 percent impaired, limited or restricted  Loney Domingo A 12/16/2015, 11:15 AM

## 2015-12-19 ENCOUNTER — Encounter (HOSPITAL_COMMUNITY): Payer: Self-pay | Admitting: Specialist

## 2016-01-01 ENCOUNTER — Other Ambulatory Visit: Payer: Self-pay | Admitting: Family Medicine

## 2016-01-10 ENCOUNTER — Encounter: Payer: Self-pay | Admitting: Family Medicine

## 2016-01-10 ENCOUNTER — Ambulatory Visit (INDEPENDENT_AMBULATORY_CARE_PROVIDER_SITE_OTHER): Payer: BLUE CROSS/BLUE SHIELD | Admitting: Family Medicine

## 2016-01-10 VITALS — BP 124/86 | HR 107 | Temp 98.9°F | Wt 149.6 lb

## 2016-01-10 DIAGNOSIS — K219 Gastro-esophageal reflux disease without esophagitis: Secondary | ICD-10-CM | POA: Diagnosis not present

## 2016-01-10 DIAGNOSIS — F329 Major depressive disorder, single episode, unspecified: Secondary | ICD-10-CM | POA: Diagnosis not present

## 2016-01-10 DIAGNOSIS — F32A Depression, unspecified: Secondary | ICD-10-CM

## 2016-01-10 MED ORDER — OMEPRAZOLE 40 MG PO CPDR: 40.0000 mg | DELAYED_RELEASE_CAPSULE | Freq: Every day | ORAL | Status: DC

## 2016-01-10 MED ORDER — SERTRALINE HCL 50 MG PO TABS: 50.0000 mg | ORAL_TABLET | Freq: Every day | ORAL | Status: DC

## 2016-01-10 MED ORDER — GI COCKTAIL ~~LOC~~
30.0000 mL | Freq: Once | ORAL | Status: AC
  Administered 2016-01-10: 30 mL via ORAL

## 2016-01-10 NOTE — Progress Notes (Signed)
Pre visit review using our clinic review tool, if applicable. No additional management support is needed unless otherwise documented below in the visit note. 

## 2016-01-10 NOTE — Patient Instructions (Signed)
Food Choices for Gastroesophageal Reflux Disease, Adult When you have gastroesophageal reflux disease (GERD), the foods you eat and your eating habits are very important. Choosing the right foods can help ease the discomfort of GERD. WHAT GENERAL GUIDELINES DO I NEED TO FOLLOW?  Choose fruits, vegetables, whole grains, low-fat dairy products, and low-fat meat, fish, and poultry.  Limit fats such as oils, salad dressings, butter, nuts, and avocado.  Keep a food diary to identify foods that cause symptoms.  Avoid foods that cause reflux. These may be different for different people.  Eat frequent small meals instead of three large meals each day.  Eat your meals slowly, in a relaxed setting.  Limit fried foods.  Cook foods using methods other than frying.  Avoid drinking alcohol.  Avoid drinking large amounts of liquids with your meals.  Avoid bending over or lying down until 2-3 hours after eating. WHAT FOODS ARE NOT RECOMMENDED? The following are some foods and drinks that may worsen your symptoms: Vegetables Tomatoes. Tomato juice. Tomato and spaghetti sauce. Chili peppers. Onion and garlic. Horseradish. Fruits Oranges, grapefruit, and lemon (fruit and juice). Meats High-fat meats, fish, and poultry. This includes hot dogs, ribs, ham, sausage, salami, and bacon. Dairy Whole milk and chocolate milk. Sour cream. Cream. Butter. Ice cream. Cream cheese.  Beverages Coffee and tea, with or without caffeine. Carbonated beverages or energy drinks. Condiments Hot sauce. Barbecue sauce.  Sweets/Desserts Chocolate and cocoa. Donuts. Peppermint and spearmint. Fats and Oils High-fat foods, including French fries and potato chips. Other Vinegar. Strong spices, such as black pepper, white pepper, red pepper, cayenne, curry powder, cloves, ginger, and chili powder. The items listed above may not be a complete list of foods and beverages to avoid. Contact your dietitian for more  information.   This information is not intended to replace advice given to you by your health care provider. Make sure you discuss any questions you have with your health care provider.   Document Released: 07/23/2005 Document Revised: 08/13/2014 Document Reviewed: 05/27/2013 Elsevier Interactive Patient Education 2016 [employer]  

## 2016-01-10 NOTE — Progress Notes (Signed)
Patient ID: [name], female    DOB: 1976/07/05  Age: 40 y.o. MRN: [id number]    Subjective:  Subjective HPI AMORIE SEDAM presents for dyspepsia, and constipation since back surgery.  She had an episode of black emesis.  She had been taking mobic and ibuprofen.  She stopped the mobic and was taking otc omeprazole with little relief.  She is also taking colace and miralax daily for constipation.   She has had no more black emesis and denies black stool.      Review of Systems  Constitutional: Negative for diaphoresis, appetite change, fatigue and unexpected weight change.  Eyes: Negative for pain, redness and visual disturbance.  Respiratory: Negative for cough, chest tightness, shortness of breath and wheezing.   Cardiovascular: Negative for chest pain, palpitations and leg swelling.  Gastrointestinal: Positive for abdominal pain and constipation.  Endocrine: Negative for cold intolerance, heat intolerance, polydipsia, polyphagia and polyuria.  Genitourinary: Negative for dysuria, frequency and difficulty urinating.  Neurological: Negative for dizziness, light-headedness, numbness and headaches.    History Past Medical History  Diagnosis Date  . GERD (gastroesophageal reflux disease)   . 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 Back pain   . Hypotension     per pt.   . Recurrent upper respiratory infection (URI)     viral infection - URI- 10/2011, penicillin treatmernt   . Kidney infection     1998- hosp.- [facility]  . 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 Anxiety     h/o panic attack, due to cysts in axilla area & requiring I&D, last episode, 2011,   . Numbness and tingling     right leg   . Seizures (HCC)     side effect of Tamiflu  . Anemia     She has past surgical history that includes Cesarean section; Colonoscopy; Wisdom tooth extraction; Anterior lumbar fusion (11/14/2011); Spine surgery; and Lumbar laminectomy/decompression microdiscectomy (Right, 12/15/2015).   Her family history  includes Lupus in her mother. There is no history of Anesthesia problems.She reports that she has been smoking Cigarettes.  She has a 3.75 pack-year smoking history. She has never used smokeless tobacco. She reports that she drinks alcohol. She reports that she does not use illicit drugs.  Current Outpatient Prescriptions on File Prior to Visit  Medication Sig Dispense Refill  . diazepam (VALIUM) 10 MG tablet Take 1 tablet (10 mg total) by mouth every 8 (eight) hours as needed for anxiety. 30 tablet 1  . docusate sodium (COLACE) 100 MG capsule Take 1 capsule (100 mg total) by mouth 2 (two) times daily. 40 capsule 1  . levonorgestrel (MIRENA) 20 MCG/24HR IUD 1 each by Intrauterine route once.    . methocarbamol (ROBAXIN) 500 MG tablet Take 1 tablet (500 mg total) by mouth 4 (four) times daily. 40 tablet 1  . methylphenidate (RITALIN LA) 40 MG 24 hr capsule Take 1 capsule (40 mg total) by mouth every morning. 31 capsule 0  . oxyCODONE-acetaminophen (ROXICET) 5-325 MG tablet Take 1-2 tablets by mouth every 4 (four) hours as needed for severe pain. 60 tablet 0  . SOOLANTRA 1 % CREA Apply 1 application topically daily. Applies daily for rosacea.  3  . valACYclovir (VALTREX) 1000 MG tablet Take 1 tablet (1,000 mg total) by mouth 3 (three) times daily as needed. For flare ups 90 tablet 2  . [DISCONTINUED] Norgestim-Eth Radene Journey Triphasic (ORTHO TRI-CYCLEN, 28, PO) Take 0.035 mg by mouth daily.  No current facility-administered medications on file prior to visit.     Objective:  Objective Physical Exam  Constitutional: She is oriented to person, place, and time. She appears well-developed and well-nourished.  HENT:  Head: Normocephalic and atraumatic.  Eyes: Conjunctivae and EOM are normal.  Neck: Normal range of motion. Neck supple. No JVD present. Carotid bruit is not present. No thyromegaly present.  Cardiovascular: Normal rate, regular rhythm and normal heart sounds.   No murmur  heard. Pulmonary/Chest: Effort normal and breath sounds normal. No respiratory distress. She has no wheezes. She has no rales. She exhibits no tenderness.  Abdominal: There is tenderness in the epigastric area. There is no rebound and no guarding.  Musculoskeletal: She exhibits no edema.  Neurological: She is alert and oriented to person, place, and time.  Psychiatric: She has a normal mood and affect. Her behavior is normal. Judgment and thought content normal.  Nursing note and vitals reviewed.  BP 124/86 mmHg  Pulse 107  Temp(Src) 98.9 F (37.2 C) (Oral)  Wt 149 lb 9.6 oz (67.858 kg)  SpO2 98% Wt Readings from Last 3 Encounters:  01/10/16 149 lb 9.6 oz (67.858 kg)  12/15/15 156 lb 8 oz (70.988 kg)  12/13/15 156 lb 8 oz (70.988 kg)     Lab Results  Component Value Date   WBC 13.5* 12/16/2015   HGB 12.0 12/16/2015   HCT 35.9* 12/16/2015   PLT 277 12/16/2015   GLUCOSE 142* 12/16/2015   ALT 11 11/08/2011   AST 17 11/08/2011   NA 143 12/16/2015   K 4.6 12/16/2015   CL 103 12/16/2015   CREATININE 0.78 12/16/2015   BUN 11 12/16/2015   CO2 29 12/16/2015   INR 1.07 11/08/2011    Dg Lumbar Spine 2-3 Views  12/13/2015  CLINICAL DATA:  Low back pain, preoperative evaluation for upcoming decompression EXAM: LUMBAR SPINE - 2 VIEW COMPARISON:  11/15/2011 FINDINGS: Five lumbar type vertebral bodies are well visualized. Changes of prior fusion anteriorly at L5-S1 are seen. Disc space narrowing is noted which has progressed at L4-5. Numbering nomenclature follows that of the prior exam. Fecal material is noted throughout the colon. No other focal abnormality is seen. IMPRESSION: Increasing disc space narrowing at L4-5. Electronically Signed   By: Inez Catalina M.D.   On: 12/13/2015 12:42     Assessment & Plan:  Plan I have discontinued Ms. Worthey's traMADol, meloxicam, and omeprazole. I am also having her start on omeprazole and sertraline. Additionally, I am having her maintain her  levonorgestrel, valACYclovir, diazepam, methylphenidate, SOOLANTRA, docusate sodium, methocarbamol, oxyCODONE-acetaminophen, and calcium carbonate. We administered gi cocktail.  Meds ordered this encounter  Medications  . DISCONTD: omeprazole (PRILOSEC OTC) 20 MG tablet    Sig: Take 20 mg by mouth daily.  . calcium carbonate (TUMS - DOSED IN MG ELEMENTAL CALCIUM) 500 MG chewable tablet    Sig: Chew 1 tablet by mouth daily.  Marland Kitchen omeprazole (PRILOSEC) 40 MG capsule    Sig: Take 1 capsule (40 mg total) by mouth daily.    Dispense:  30 capsule    Refill:  3  . sertraline (ZOLOFT) 50 MG tablet    Sig: Take 1 tablet (50 mg total) by mouth daily.    Dispense:  30 tablet    Refill:  3  . gi cocktail (Maalox,Lidocaine,Donnatal)    Sig:     Problem List Items Addressed This Visit    GERD - Primary   Relevant Medications   calcium carbonate (  TUMS - DOSED IN MG ELEMENTAL CALCIUM) 500 MG chewable tablet   omeprazole (PRILOSEC) 40 MG capsule   gi cocktail (Maalox,Lidocaine,Donnatal) (Completed)   Other Relevant Orders   Comprehensive metabolic panel   CBC with Differential/Platelet   H. pylori antibody, IgG    Other Visit Diagnoses    Depression        Relevant Medications    sertraline (ZOLOFT) 50 MG tablet       Follow-up: Return if symptoms worsen or fail to improve.  Ann Held, DO

## 2016-01-11 LAB — CBC WITH DIFFERENTIAL/PLATELET
BASOS ABS: 0.1 10*3/uL (ref 0.0–0.1)
Basophils Relative: 0.8 % (ref 0.0–3.0)
EOS ABS: 0.4 10*3/uL (ref 0.0–0.7)
EOS PCT: 7.5 % — AB (ref 0.0–5.0)
HCT: 38.7 % (ref 36.0–46.0)
HEMOGLOBIN: 13.2 g/dL (ref 12.0–15.0)
LYMPHS ABS: 1.6 10*3/uL (ref 0.7–4.0)
Lymphocytes Relative: 26 % (ref 12.0–46.0)
MCHC: 34.1 g/dL (ref 30.0–36.0)
MCV: 94 fl (ref 78.0–100.0)
MONO ABS: 0.7 10*3/uL (ref 0.1–1.0)
Monocytes Relative: 11.1 % (ref 3.0–12.0)
NEUTROS PCT: 54.6 % (ref 43.0–77.0)
Neutro Abs: 3.3 10*3/uL (ref 1.4–7.7)
Platelets: 242 10*3/uL (ref 150.0–400.0)
RBC: 4.12 Mil/uL (ref 3.87–5.11)
RDW: 13.1 % (ref 11.5–15.5)
WBC: 6 10*3/uL (ref 4.0–10.5)

## 2016-01-11 LAB — COMPREHENSIVE METABOLIC PANEL
ALBUMIN: 4.5 g/dL (ref 3.5–5.2)
ALK PHOS: 36 U/L — AB (ref 39–117)
ALT: 27 U/L (ref 0–35)
AST: 33 U/L (ref 0–37)
BILIRUBIN TOTAL: 0.4 mg/dL (ref 0.2–1.2)
BUN: 9 mg/dL (ref 6–23)
CO2: 29 mEq/L (ref 19–32)
CREATININE: 0.74 mg/dL (ref 0.40–1.20)
Calcium: 9.3 mg/dL (ref 8.4–10.5)
Chloride: 100 mEq/L (ref 96–112)
GFR: 92.53 mL/min (ref >60.00)
GLUCOSE: 108 mg/dL — AB (ref 70–99)
POTASSIUM: 4.5 meq/L (ref 3.5–5.1)
Sodium: 137 mEq/L (ref 135–145)
Total Protein: 7.8 g/dL (ref 6.0–8.3)

## 2016-01-11 LAB — H. PYLORI ANTIBODY, IGG: H PYLORI IGG: NEGATIVE

## 2016-01-16 ENCOUNTER — Telehealth: Payer: Self-pay | Admitting: *Deleted

## 2016-01-16 NOTE — Telephone Encounter (Signed)
PA initiated on [email or web address] with [facility] of [place], pt id#: [id number]. Awaiting determination. JG//CMA

## 2016-01-18 NOTE — Telephone Encounter (Signed)
PA approved effective 01/16/2016 through 08/05/9998 with the number of refills of 999 by [facility] of [place]. Approval letter faxed to pharmacy and sent for scanning.   Case ID: [id number]

## 2016-01-31 ENCOUNTER — Telehealth: Payer: Self-pay | Admitting: Family Medicine

## 2016-01-31 NOTE — Telephone Encounter (Signed)
Please advise      KP 

## 2016-01-31 NOTE — Telephone Encounter (Signed)
She should ask the surgeon if any of the meds they are writing can cause her to have difficulty swallowing--- we would need to see her first for referral

## 2016-01-31 NOTE — Telephone Encounter (Signed)
Pt called in because she says that she has had back surgery and since then has been prescribed several medications. Pt says that she is now having a hard time swollowing. Pt would like to know if she could have a referral to Gastro ?    Please advise further.    CB: [phone] (cell) -    [phone] (work)

## 2016-02-01 NOTE — Telephone Encounter (Signed)
Called and spoke with the pt and informed her of the note below.  Pt verbalized understanding.  She said she believes it is the medication that is causing the problem.  Pt stated that she has called the surgeon, but she will give them a call back.  She will also call back and schedule an appt with Dr. Minette Brine

## 2016-02-08 ENCOUNTER — Telehealth: Payer: Self-pay | Admitting: Family Medicine

## 2016-02-08 NOTE — Telephone Encounter (Signed)
Pt scheduled to see Percell Miller tomorrow (02/09/16) at 11:15 am.  Message routed to provider for review.

## 2016-02-08 NOTE — Telephone Encounter (Signed)
error:[id number] ° °

## 2016-02-08 NOTE — Telephone Encounter (Signed)
Patient Name: [name]  DOB: May 17, 1976    Initial Comment Caller states she had back surgery in May. She may have side effects to the medication she's on.   Nurse Assessment  Nurse: Leilani Merl, RN, Heather Date/Time (Eastern Time): 02/08/2016 2:23:56 PM  Confirm and document reason for call. If symptomatic, describe symptoms. You must click the next button to save text entered. ---Caller states she had back surgery in May. She may have side effects to the medication she's on. She is on gabapentin, robaxin, valium, zoloft, oxycodone. She is having trouble swallowing food off and on and she is spitting up mucus. She last got choked yesterday  Has the patient traveled out of the country within the last 30 days? ---Not Applicable  Does the patient have any new or worsening symptoms? ---Yes  Will a triage be completed? ---Yes  Related visit to physician within the last 2 weeks? ---No  Does the PT have any chronic conditions? (i.e. diabetes, asthma, etc.) ---Yes  List chronic conditions. ---See MR  Is this a behavioral health or substance abuse call? ---No     Guidelines    Guideline Title Affirmed Question Affirmed Notes  Swallowing Difficulty [1] Swallowing difficulty AND [2] cause unknown (Exception: difficulty swallowing is a chronic symptom)    Final Disposition User   See Physician within 24 Hours Standifer, RN, Water quality scientist    Comments  Appt scheduled for tomorrow with Mackie Pai at 11:15 am.   Referrals  REFERRED TO PCP OFFICE   Disagree/Comply: Leta Baptist

## 2016-02-09 ENCOUNTER — Ambulatory Visit (INDEPENDENT_AMBULATORY_CARE_PROVIDER_SITE_OTHER): Payer: BLUE CROSS/BLUE SHIELD | Admitting: Medical

## 2016-02-09 ENCOUNTER — Encounter: Payer: Self-pay | Admitting: Medical

## 2016-02-09 VITALS — BP 120/80 | HR 90 | Temp 98.8°F | Ht 63.0 in | Wt 145.0 lb

## 2016-02-09 DIAGNOSIS — K219 Gastro-esophageal reflux disease without esophagitis: Secondary | ICD-10-CM | POA: Diagnosis not present

## 2016-02-09 DIAGNOSIS — R1310 Dysphagia, unspecified: Secondary | ICD-10-CM | POA: Diagnosis not present

## 2016-02-09 DIAGNOSIS — M25561 Pain in right knee: Secondary | ICD-10-CM | POA: Diagnosis not present

## 2016-02-09 MED ORDER — RANITIDINE HCL 150 MG PO CAPS: 150.0000 mg | ORAL_CAPSULE | Freq: Two times a day (BID) | ORAL | Status: DC

## 2016-02-09 NOTE — Progress Notes (Signed)
Subjective:    Patient ID: [name], female    DOB: September 17, 1975, 40 y.o.   MRN: [id number]  HPI   Pt states history of lumbar spine surgery l5-S1 fusion 5 years ago. Pt states then in may got shooting pain running down her rt leg. Pt went back to [place]. Then had surgery.   Pt states had black vomit on June 6th. Pt no longer has any black vomitus.That had not occurred over past month. Pt states since that over past month some mild difficulty swollowing. But recently the she ate hot dog and felt like it got stuck. Then she was coughing up mucous. Pt has history of heart burn. She takes prilosec 40 mg a day. If she cuts up small foods does just fine. Pt had some refux intermittnet for 10 years. She is able to swallow liquids.  Pt eval by Dr. Etter Sjogren showed neg cbc,cmp and neg h pylori.   Pt on gabapentin 300 mg at night. But it kept up at night. Could not sleep.(now on gabapentin 100 mg tid) Pt still has severe pain mostly just pretibial to her ankle. Pain is sharp at end of day. But superificially feels numb. Pt thinks does not need muscle.Was on robaxin. Pt is on oxycodone 5 mg 2 tab every 6 hours.  Pt can swollow foods but states better if they are small pieces      Review of Systems  Constitutional: Negative for fever, chills and fatigue.  Respiratory: Negative for cough, chest tightness, shortness of breath and wheezing.   Cardiovascular: Negative for chest pain and palpitations.  Gastrointestinal: Negative for nausea, vomiting, abdominal pain, diarrhea, constipation, blood in stool and abdominal distention.       Hx of gerd and trouble swollowing large solid foods  Genitourinary: Negative for dysuria and dyspareunia.  Musculoskeletal: Negative for back pain.  Skin: Negative for pallor.  Neurological: Negative for dizziness and headaches.  Hematological: Negative for adenopathy. Does not bruise/bleed easily.  Psychiatric/Behavioral: Negative for behavioral  problems and confusion.    Past Medical History  Diagnosis Date  . GERD (gastroesophageal reflux disease)   . 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 Back pain   . Hypotension     per pt.   . Recurrent upper respiratory infection (URI)     viral infection - URI- 10/2011, penicillin treatmernt   . Kidney infection     1998- hosp.- [facility]  . 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 Anxiety     h/o panic attack, due to cysts in axilla area & requiring I&D, last episode, 2011,   . Numbness and tingling     right leg   . Seizures (HCC)     side effect of Tamiflu  . Anemia      Social History   Social History  . Marital Status: Married    Spouse Name: N/A  . Number of Children: N/A  . Years of Education: N/A   Occupational History  . Not on file.   Social History Main Topics  . Smoking status: Current Every Day Smoker -- 0.25 packs/day for 15 years    Types: Cigarettes  . Smokeless tobacco: Never Used  . Alcohol Use: Yes     Comment: a glass of wine  on occas.   . Drug Use: No  . Sexual Activity: Not on file   Other Topics Concern  . Not on file   Social History Narrative  Past Surgical History  Procedure Laterality Date  . Cesarean section      2007- /w epidural  anesthesia   . Colonoscopy    . Wisdom tooth extraction      young adult  . Anterior lumbar fusion  11/14/2011    Procedure: ANTERIOR LUMBAR FUSION 2 LEVELS;  Surgeon: Johnn Hai, MD;  Location: [place];  Service: Orthopedics;  Laterality: N/A;  ALIF L5-S1  . Spine surgery      fusion  . Lumbar laminectomy/decompression microdiscectomy Right 12/15/2015    Procedure:  MICO LUMBER DECOMPRESSION L4-5 ON THE RIGHT, ;  Surgeon: Susa Day, MD;  Location: [facility];  Service: Orthopedics;  Laterality: Right;    Family History  Problem Relation Age of Onset  . Lupus Mother   . Anesthesia problems Neg Hx     Allergies  Allergen Reactions  . Tamiflu [Oseltamivir Phosphate] Other (See Comments)    Seizure  . Ciprofloxacin  Diarrhea and Nausea And Vomiting  . Metoclopramide Hcl Other (See Comments)    "lock jaw"  . Sulfa Antibiotics Nausea And Vomiting    Current Outpatient Prescriptions on File Prior to Visit  Medication Sig Dispense Refill  . diazepam (VALIUM) 10 MG tablet Take 1 tablet (10 mg total) by mouth every 8 (eight) hours as needed for anxiety. 30 tablet 1  . docusate sodium (COLACE) 100 MG capsule Take 1 capsule (100 mg total) by mouth 2 (two) times daily. 40 capsule 1  . levonorgestrel (MIRENA) 20 MCG/24HR IUD 1 each by Intrauterine route once.    . methylphenidate (RITALIN LA) 40 MG 24 hr capsule Take 1 capsule (40 mg total) by mouth every morning. 31 capsule 0  . omeprazole (PRILOSEC) 40 MG capsule Take 1 capsule (40 mg total) by mouth daily. 30 capsule 3  . oxyCODONE-acetaminophen (ROXICET) 5-325 MG tablet Take 1-2 tablets by mouth every 4 (four) hours as needed for severe pain. 60 tablet 0  . sertraline (ZOLOFT) 50 MG tablet Take 1 tablet (50 mg total) by mouth daily. 30 tablet 3  . SOOLANTRA 1 % CREA Apply 1 application topically daily. Applies daily for rosacea.  3  . valACYclovir (VALTREX) 1000 MG tablet Take 1 tablet (1,000 mg total) by mouth 3 (three) times daily as needed. For flare ups 90 tablet 2  . methocarbamol (ROBAXIN) 500 MG tablet Take 1 tablet (500 mg total) by mouth 4 (four) times daily. (Patient not taking: Reported on 02/09/2016) 40 tablet 1  . [DISCONTINUED] Norgestim-Eth Radene Journey Triphasic (ORTHO TRI-CYCLEN, 28, PO) Take 0.035 mg by mouth daily.      No current facility-administered medications on file prior to visit.    BP 120/80 mmHg  Pulse 90  Temp(Src) 98.8 F (37.1 C) (Oral)  Ht 5\' 3"  (1.6 m)  Wt 145 lb (65.772 kg)  BMI 25.69 kg/m2  SpO2 98%       Objective:   Physical Exam  General Appearance- Not in acute distress.   Mouth- normal pharynx. Not swollen.  HEENT Eyes- Scleraeral/Conjuntiva-bilat- Not Yellow. Mouth & Throat- Normal.  Chest and Lung  Exam Auscultation: Breath sounds:-Normal. Adventitious sounds:- No Adventitious sounds.  Cardiovascular Auscultation:Rythm - Regular. Heart Sounds -Normal heart sounds.  Abdomen Inspection:-Inspection Normal.  Palpation/Perucssion: Palpation and Percussion of the abdomen reveal- Non Tender, No Rebound tenderness, No rigidity(Guarding) and No Palpable abdominal masses.  Liver:-Normal.  Spleen:- Normal.    Back No Mid lumbar spine tenderness to palpation.  Lower ext neurologic  L5-S1 sensation intact bilaterally.  No foot drop bilaterally.        Assessment & Plan:  For your history of gerd continue omeprazole and add ranitidine.  For your difficulty swallowing and gerd history will refer to GI. Eat soups, soft foods and small pieces. Will try t to expedite referral.  For your leg pain continue current pain regimen. This is likely associated with back history.  Follow up in 2 weeks or as needed

## 2016-02-09 NOTE — Patient Instructions (Addendum)
For your history of gerd continue omeprazole and add ranitidine.  For your difficulty swallowing and gerd history will refer to GI. Eat soups, soft foods and small pieces. Will try t to expedite referral.  For your leg pain continue current pain regimen. This is likely associated with back history.  Follow up in 2 weeks or as needed

## 2016-02-09 NOTE — Progress Notes (Signed)
Pre visit review using our clinic review tool, if applicable. No additional management support is needed unless otherwise documented below in the visit note. 

## 2016-02-15 ENCOUNTER — Encounter: Payer: Self-pay | Admitting: Gastroenterology

## 2016-02-15 ENCOUNTER — Telehealth: Payer: Self-pay | Admitting: Gastroenterology

## 2016-02-15 ENCOUNTER — Ambulatory Visit (INDEPENDENT_AMBULATORY_CARE_PROVIDER_SITE_OTHER): Payer: BLUE CROSS/BLUE SHIELD | Admitting: Gastroenterology

## 2016-02-15 VITALS — BP 122/78 | HR 68 | Ht 63.0 in | Wt 144.6 lb

## 2016-02-15 DIAGNOSIS — R1319 Other dysphagia: Secondary | ICD-10-CM | POA: Insufficient documentation

## 2016-02-15 DIAGNOSIS — K219 Gastro-esophageal reflux disease without esophagitis: Secondary | ICD-10-CM | POA: Diagnosis not present

## 2016-02-15 DIAGNOSIS — R1310 Dysphagia, unspecified: Secondary | ICD-10-CM | POA: Insufficient documentation

## 2016-02-15 NOTE — Patient Instructions (Signed)
Take Miralax and a fiber supplement daily. You have been scheduled for an endoscopy. Please follow written instructions given to you at your visit today. If you use inhalers (even only as needed), please bring them with you on the day of your procedure. Your physician has requested that you go to www.startemmi.com and enter the access code given to you at your visit today. This web site gives a general overview about your procedure. However, you should still follow specific instructions given to you by our office regarding your preparation for the procedure.

## 2016-02-15 NOTE — Progress Notes (Signed)
02/15/2016 [name] [id number] 10-Jun-1976   HISTORY OF PRESENT ILLNESS:  This is a 40 year old female who is new to our practice.  Her father is a retired family physician from [employer]; he is with her at her visit today.  She is here today with complaints of problems swallowing solid food. She says that things have been getting stuck intermittently for the past month. She says that usually occurs with the first bite and then she brings up a lot of mucus and liquid material. Eventually the food will go down sometimes after an hour. On July 3 she had a hot dog that was stuck. She admits to long-standing heartburn and reflux for which she used to take Prilosec. This is dating back more than 10 years. More recently she had been using Tums in large quantities, but is now back on Prilosec 40 mg daily and Zantac 150 mg twice daily. She feels like those medications are helping her and she denies any symptomatic reflux at this time. No issues with swallowing liquids.  She also reports that she had an episode in June where she vomited some black material. CBC, CMP, H. pylori will all normal/negative at that time.  She also briefly mentions that she's had some constipation since her recent back surgery in May due to pain medication and muscle relaxants. Then she also has occasional diarrhea as well, however.   Past Medical History  Diagnosis Date  . GERD (gastroesophageal reflux disease)   . 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 Back pain   . Hypotension     per pt.   . Recurrent upper respiratory infection (URI)     viral infection - URI- 10/2011, penicillin treatmernt   . Kidney infection     1998- hosp.- [facility]  . 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 Anxiety     h/o panic attack, due to cysts in axilla area & requiring I&D, last episode, 2011,   . Numbness and tingling     right leg   . Seizures (HCC)     side effect of Tamiflu  . Anemia    Past Surgical History  Procedure Laterality Date  .  Cesarean section      2007- /w epidural  anesthesia   . Colonoscopy    . Wisdom tooth extraction      young adult  . Anterior lumbar fusion  11/14/2011    Procedure: ANTERIOR LUMBAR FUSION 2 LEVELS;  Surgeon: Johnn Hai, MD;  Location: [place];  Service: Orthopedics;  Laterality: N/A;  ALIF L5-S1  . Spine surgery      fusion  . Lumbar laminectomy/decompression microdiscectomy Right 12/15/2015    Procedure:  MICO LUMBER DECOMPRESSION L4-5 ON THE RIGHT, ;  Surgeon: Susa Day, MD;  Location: [facility];  Service: Orthopedics;  Laterality: Right;    reports that she has quit smoking. Her smoking use included Cigarettes. She has a 3.75 pack-year smoking history. She has never used smokeless tobacco. She reports that she drinks alcohol. She reports that she does not use illicit drugs. family history includes Lupus in her mother. There is no history of Anesthesia problems or Colon cancer. Allergies  Allergen Reactions  . Tamiflu [Oseltamivir Phosphate] Other (See Comments)    Seizure  . Ciprofloxacin Diarrhea and Nausea And Vomiting  . Metoclopramide Hcl Other (See Comments)    "lock jaw"  . Sulfa Antibiotics Nausea And Vomiting  Outpatient Encounter Prescriptions as of 02/15/2016  Medication Sig  . aspirin 81 MG tablet Take 81 mg by mouth daily.  . diazepam (VALIUM) 10 MG tablet Take 1 tablet (10 mg total) by mouth every 8 (eight) hours as needed for anxiety.  . docusate sodium (COLACE) 100 MG capsule Take 1 capsule (100 mg total) by mouth 2 (two) times daily.  Marland Kitchen gabapentin (NEURONTIN) 100 MG capsule TAKE 1 CAPSULE AT BEDTIME X 3DAYS, 1 CAPSULE TWICE DAILY X 3DAYS, THEN 1 CAP 3 TIMES A DAY THEN ON  . levonorgestrel (MIRENA) 20 MCG/24HR IUD 1 each by Intrauterine route once.  . methocarbamol (ROBAXIN) 500 MG tablet Take 1 tablet (500 mg total) by mouth 4 (four) times daily.  . methylphenidate (RITALIN LA) 40 MG 24 hr capsule Take 1 capsule (40 mg total) by mouth every morning.  Marland Kitchen  omeprazole (PRILOSEC) 40 MG capsule Take 1 capsule (40 mg total) by mouth daily.  Marland Kitchen oxyCODONE-acetaminophen (ROXICET) 5-325 MG tablet Take 1-2 tablets by mouth every 4 (four) hours as needed for severe pain.  . ranitidine (ZANTAC) 150 MG capsule Take 1 capsule (150 mg total) by mouth 2 (two) times daily.  . sertraline (ZOLOFT) 50 MG tablet Take 1 tablet (50 mg total) by mouth daily.  . SOOLANTRA 1 % CREA Apply 1 application topically daily. Applies daily for rosacea.  . valACYclovir (VALTREX) 1000 MG tablet Take 1 tablet (1,000 mg total) by mouth 3 (three) times daily as needed. For flare ups   No facility-administered encounter medications on file as of 02/15/2016.     REVIEW OF SYSTEMS  : All other systems reviewed and negative except where noted in the History of Present Illness.   PHYSICAL EXAM: BP 122/78 mmHg  Pulse 68  Ht 5\' 3"  (1.6 m)  Wt 144 lb 9.6 oz (65.59 kg)  BMI 25.62 kg/m2 General: Well developed white female in no acute distress Head: Normocephalic and atraumatic Eyes:  Sclerae anicteric, conjunctiva pink. Ears: Normal auditory acuity Lungs: Clear throughout to auscultation Heart: Regular rate and rhythm Abdomen: Soft, non-distended.  Normal bowel sounds.  Non-tender. Musculoskeletal: Symmetrical with no gross deformities  Skin: No lesions on visible extremities Extremities: No edema  Neurological: Alert oriented x 4, grossly non-focal Psychological:  Alert and cooperative. Normal mood and affect  ASSESSMENT AND PLAN: -Dysphagia to solid foods with associated long-standing acid reflux:  We'll schedule for EGD with possible dilation to rule out esophageal stricture from associated reflux, etc.  Continue omeprazole 40 mg daily and Zantac 150 Miller grams twice daily for now. -Alternating bowel habits:  Having constipation after her back surgery due to pain medication and muscle relaxants but also has occasional diarrhea as well. I have asked her to begin taking a daily  fiber supplement such as Metamucil or Benefiber in combination with MiraLAX daily.  *The risks, benefits, and alternatives to EGD and dilation were discussed with the patient and she consents to proceed.     CC:  Saguier, Percell Miller, PA-C

## 2016-02-16 ENCOUNTER — Telehealth: Payer: Self-pay | Admitting: Family Medicine

## 2016-02-16 NOTE — Telephone Encounter (Signed)
To MD to review and advise      KP 

## 2016-02-16 NOTE — Telephone Encounter (Signed)
Pt called back in to F/U on Rx. Pt says that she is completely out of her medication.     Advised by CMA, she will call pt as soon as Rx is ready for pick up.   Informed pt, she expressed understanding.

## 2016-02-16 NOTE — Telephone Encounter (Signed)
Please verify it was not filled  Ok to print --this time only

## 2016-02-16 NOTE — Telephone Encounter (Signed)
°  Relationship to patient: Self Can be reached: [phone]    Reason for call: Patient states she lost her July Rx for methylphenidate (RITALIN LA) 40 MG 24 hr capsule [id number]

## 2016-02-16 NOTE — Progress Notes (Signed)
Reviewed and agree with documentation and assessment and plan. K. Veena Darthula Desa , MD   

## 2016-02-16 NOTE — Telephone Encounter (Signed)
Called [facility] [address].   They informed that patient's last filled prescription on June 12th and has [facility] [address].  [facility] [address] and they confirmed the same.  They were asked to release rx on hold.  Pt notified and made aware that rx was there and that they will have rx ready for pick up.  Pt was appreciated.  She did not know rx was there.  No further questions or concerns voiced.

## 2016-02-16 NOTE — Telephone Encounter (Signed)
Contacted the patient about new instructions.

## 2016-02-20 ENCOUNTER — Encounter: Payer: BLUE CROSS/BLUE SHIELD | Admitting: Gastroenterology

## 2016-02-20 DIAGNOSIS — Z4789 Encounter for other orthopedic aftercare: Secondary | ICD-10-CM | POA: Diagnosis not present

## 2016-02-20 DIAGNOSIS — M25571 Pain in right ankle and joints of right foot: Secondary | ICD-10-CM | POA: Diagnosis not present

## 2016-02-20 DIAGNOSIS — Z9889 Other specified postprocedural states: Secondary | ICD-10-CM | POA: Diagnosis not present

## 2016-02-22 ENCOUNTER — Encounter: Payer: Self-pay | Admitting: Gastroenterology

## 2016-02-22 ENCOUNTER — Ambulatory Visit (AMBULATORY_SURGERY_CENTER): Payer: BLUE CROSS/BLUE SHIELD | Admitting: Gastroenterology

## 2016-02-22 VITALS — BP 143/99 | HR 89 | Temp 98.0°F | Resp 13 | Ht 63.0 in | Wt 144.0 lb

## 2016-02-22 DIAGNOSIS — R1319 Other dysphagia: Secondary | ICD-10-CM | POA: Diagnosis present

## 2016-02-22 DIAGNOSIS — K222 Esophageal obstruction: Secondary | ICD-10-CM | POA: Diagnosis not present

## 2016-02-22 DIAGNOSIS — K3189 Other diseases of stomach and duodenum: Secondary | ICD-10-CM | POA: Diagnosis not present

## 2016-02-22 DIAGNOSIS — R109 Unspecified abdominal pain: Secondary | ICD-10-CM | POA: Diagnosis not present

## 2016-02-22 MED ORDER — OMEPRAZOLE 40 MG PO CPDR: 40.0000 mg | DELAYED_RELEASE_CAPSULE | Freq: Two times a day (BID) | ORAL | Status: DC

## 2016-02-22 MED ORDER — SODIUM CHLORIDE 0.9 % IV SOLN: 500.0000 mL | INTRAVENOUS | Status: DC

## 2016-02-22 MED ORDER — RANITIDINE HCL 300 MG PO TABS: 300.0000 mg | ORAL_TABLET | Freq: Every evening | ORAL | Status: DC | PRN

## 2016-02-22 NOTE — Op Note (Signed)
[facility] Patient Name: [name] Procedure Date: 02/22/2016 2:02 PM MRN: [id number] Endoscopist: Mauri Pole , MD Age: 40 Referring MD:  Date of Birth: 1976-04-24 Gender: Female Account #: [id number] Procedure:                Upper GI endoscopy Indications:              Dysphagia Medicines:                Monitored Anesthesia Care Procedure:                Pre-Anesthesia Assessment:                           - Prior to the procedure, a History and Physical                            was performed, and patient medications and                            allergies were reviewed. The patient's tolerance of                            previous anesthesia was also reviewed. The risks                            and benefits of the procedure and the sedation                            options and risks were discussed with the patient.                            All questions were answered, and informed consent                            was obtained. Prior Anticoagulants: The patient has                            taken no previous anticoagulant or antiplatelet                            agents. ASA Grade Assessment: II - A patient with                            mild systemic disease. After reviewing the risks                            and benefits, the patient was deemed in                            satisfactory condition to undergo the procedure.                           After obtaining informed consent, the endoscope was  passed under direct vision. Throughout the                            procedure, the patient's blood pressure, pulse, and                            oxygen saturations were monitored continuously. The                            Model GIF-[id number]) scope was introduced                            through the mouth, and advanced to the second part                            of duodenum. The upper GI endoscopy  was                            accomplished without difficulty. The patient                            tolerated the procedure well. Scope In: Scope Out: Findings:                 The esophagus and gastroesophageal junction were                            examined with white light and narrow band imaging                            (NBI). There were esophageal mucosal changes                            suggestive of long-segment Barrett's esophagus.                            These changes involved the mucosa at the upper                            extent of the gastric folds (30 cm from the                            incisors) extending to the Z-line (24 cm from the                            incisors). Circumferential salmon-colored mucosa                            was present from 24 to 30 cm . The maximum                            longitudinal extent of these esophageal mucosal  changes was 6 cm in length. Mucosa was biopsied                            with a cold forceps for histology in 4 quadrants at                            intervals of 1 cm at 26, 28 and 30 cm from the                            incisors. A total of 3 specimen bottles were sent                            to pathology. Estimated blood loss was minimal.                           One moderate benign-appearing, intrinsic stenosis                            was found at 24 cm from the incisors. This measured                            1.5 cm (inner diameter) x less than one cm (in                            length) and was traversed. A TTS dilator was passed                            through the scope. Dilation with a 16-17-18 mm                            balloon dilator was performed to 18 mm. The                            dilation site was examined and showed moderate                            improvement in luminal narrowing. Estimated blood                            loss was  minimal.                           Scattered mild inflammation characterized by                            congestion (edema) was found in the entire examined                            stomach. Biopsies were taken with a cold forceps                            for Helicobacter pylori testing.  The examined duodenum was normal. Complications:            No immediate complications. Estimated Blood Loss:     Estimated blood loss was minimal. Impression:               - Esophageal mucosal changes suggestive of                            long-segment Barrett's esophagus. Biopsied.                           - Benign-appearing esophageal stenosis. Dilated to                            34mm                           - Gastritis. Biopsied.                           - Normal examined duodenum. Recommendation:           - Patient has a contact number available for                            emergencies. The signs and symptoms of potential                            delayed complications were discussed with the                            patient. Return to normal activities tomorrow.                            Written discharge instructions were provided to the                            patient.                           - Resume previous diet.                           - Continue present medications. Increase Omeprazole                            to 40mg  twice daily, 30 mins before breakfast and                            dinner. Zantac at bedtime as needed                           - Await pathology results.                           - Repeat upper endoscopy in 3 - 5 years for  surveillance based on pathology results.                           - Return to GI clinic PRN.                           Follow an antireflux regimen indefinitely. This                            includes:                           - Do not lie down for at least 3 to 4  hours after                            meals.                           - Raise the head of the bed 4 to 6 inches.                           - Decrease excess weight.                           - Avoid citrus juices and other acidic foods,                            alcohol, chocolate, mints, coffee and other                            caffeinated beverages, carbonated beverages, fatty                            and fried foods.                           - Avoid tight-fitting clothing.                           - Avoid cigarettes and other tobacco products Mauri Pole, MD 02/22/2016 2:41:51 PM This report has been signed electronically.

## 2016-02-22 NOTE — Patient Instructions (Signed)

## 2016-02-22 NOTE — Progress Notes (Signed)
Pt awake c/o sore throat To RR Stable

## 2016-02-22 NOTE — Progress Notes (Signed)
Called to room to assist during endoscopic procedure.  Patient ID and intended procedure confirmed with present staff. Received instructions for my participation in the procedure from the performing physician.  

## 2016-02-22 NOTE — Progress Notes (Signed)
Pt. Complains of swelling left side of lower lip.  Observed to be slightly swollen, skin is not broken.  Offered ice, pt. Refused.

## 2016-02-23 ENCOUNTER — Telehealth: Payer: Self-pay | Admitting: Gastroenterology

## 2016-02-23 ENCOUNTER — Telehealth: Payer: Self-pay

## 2016-02-23 NOTE — Telephone Encounter (Signed)
  Follow up Call-  Call back number 02/22/2016  Post procedure Call Back phone  # [phone]  Permission to leave phone message Yes    Patient was called for follow up after her procedure on 02/22/2016. No answer at the number given for follow up phone call. A message was left on the answering  machine.

## 2016-02-23 NOTE — Telephone Encounter (Signed)
Procedure report faxed to Dr. Tonita Cong. Path report not back yet.

## 2016-02-28 ENCOUNTER — Encounter: Payer: Self-pay | Admitting: Gastroenterology

## 2016-03-19 ENCOUNTER — Telehealth: Payer: Self-pay | Admitting: Family Medicine

## 2016-03-19 DIAGNOSIS — F988 Other specified behavioral and emotional disorders with onset usually occurring in childhood and adolescence: Secondary | ICD-10-CM

## 2016-03-19 MED ORDER — METHYLPHENIDATE HCL ER (LA) 40 MG PO CP24: 40.0000 mg | ORAL_CAPSULE | ORAL | 0 refills | Status: DC

## 2016-03-19 NOTE — Telephone Encounter (Signed)
Relationship to patient: Self Can be reached: [phone]    Reason for call: Refill refill methylphenidate (RITALIN LA) 40 MG 24 hr capsule [id number] 3 month supply.      Pt would like to know if her spouse could come in to pick up.

## 2016-03-19 NOTE — Telephone Encounter (Signed)
Patient aware med's ready for pickup  in the morning.     KP

## 2016-04-20 DIAGNOSIS — Z4789 Encounter for other orthopedic aftercare: Secondary | ICD-10-CM | POA: Diagnosis not present

## 2016-04-20 DIAGNOSIS — Z9889 Other specified postprocedural states: Secondary | ICD-10-CM | POA: Diagnosis not present

## 2016-05-11 ENCOUNTER — Telehealth: Payer: Self-pay | Admitting: Family Medicine

## 2016-05-11 NOTE — Telephone Encounter (Signed)
Not on a hi dose, so she could take 1/2 tab po daily x 7 days then drop to 1/2 tab po qod x 1-2 weeks and stop

## 2016-05-11 NOTE — Telephone Encounter (Signed)
Message left to call the office.    KP 

## 2016-05-11 NOTE — Telephone Encounter (Signed)
Caller name: Relationship to patient: Self Can be reached: [phone] [id number] [place], [place] [phone] (Phone) [phone] (Fax)     Reason for call: Patient request call back in reference to changing her Zoloft dosage.  Also would need a refill on the Zoloft until her provider (Dr. Etter Sjogren) is back in the office and she can discuss with her why she request to change doses.

## 2016-05-11 NOTE — Telephone Encounter (Signed)
Spoke with patient and she stated she wanted to stop the Zoloft, she wanted to know how to wean off if it. Please advise    KP

## 2016-05-14 MED ORDER — SERTRALINE HCL 50 MG PO TABS: ORAL_TABLET | ORAL | 0 refills | Status: DC

## 2016-05-14 NOTE — Telephone Encounter (Signed)
Discussed with patient an she verbalized understanding, the Rx has been sent with the new directions be cause she is running out of pills, she has 4 left  #15 sent.     KP

## 2016-06-18 ENCOUNTER — Other Ambulatory Visit: Payer: Self-pay | Admitting: Family Medicine

## 2016-06-18 NOTE — Telephone Encounter (Signed)
Last ov 01/10/16. Last fill 03/19/16 #31 0. Please advise

## 2016-06-18 NOTE — Telephone Encounter (Signed)
Patient is requesting a refill of methylphenidate (RITALIN LA) 40 MG 24 hr capsule. Patient would also like to know if her husband can pick up the prescription. Please advise.   Patient phone: [phone]

## 2016-06-19 ENCOUNTER — Telehealth: Payer: Self-pay

## 2016-06-19 ENCOUNTER — Other Ambulatory Visit: Payer: Self-pay

## 2016-06-19 DIAGNOSIS — F988 Other specified behavioral and emotional disorders with onset usually occurring in childhood and adolescence: Secondary | ICD-10-CM

## 2016-06-19 MED ORDER — METHYLPHENIDATE HCL ER (LA) 40 MG PO CP24: 40.0000 mg | ORAL_CAPSULE | ORAL | 0 refills | Status: DC

## 2016-06-19 NOTE — Telephone Encounter (Signed)
Patient called regarding this situation again. She states that she gets off at 5:00 and would like to pick this up today.

## 2016-06-19 NOTE — Telephone Encounter (Signed)
Message routed to Dr. Etter Sjogren for review.

## 2016-06-19 NOTE — Telephone Encounter (Signed)
I believe we already took care of this

## 2016-06-19 NOTE — Telephone Encounter (Signed)
Patient is calling back to follow up on request for Ritalin. Plse adv

## 2016-06-19 NOTE — Telephone Encounter (Signed)
Rx has been filled and signed by pcp only for 30 days Pt's husband is coming to get the medication [name]. Pt is aware she needs to come in for follow up appt. PC

## 2016-06-19 NOTE — Telephone Encounter (Signed)
Pt called again asking for her meds since pt mentioned does not have any left for her to take today. Please advise ASAP.

## 2016-06-19 NOTE — Telephone Encounter (Signed)
Last ov 01/10/16. Last fill 03/19/16 #31 0. Please advise pc

## 2016-06-20 NOTE — Telephone Encounter (Signed)
[place], [place]      06/19/16 6:41 PM  Note    Rx has been filled and signed by pcp only for 30 days Pt's husband is coming to get the medication [name]. Pt is aware she needs to come in for follow up appt. PC

## 2016-06-21 NOTE — Telephone Encounter (Signed)
Patients husband picked rx up

## 2016-06-22 NOTE — Telephone Encounter (Signed)
error 

## 2016-07-16 ENCOUNTER — Telehealth: Payer: Self-pay | Admitting: Family Medicine

## 2016-07-16 ENCOUNTER — Other Ambulatory Visit: Payer: Self-pay

## 2016-07-16 DIAGNOSIS — F988 Other specified behavioral and emotional disorders with onset usually occurring in childhood and adolescence: Secondary | ICD-10-CM

## 2016-07-16 MED ORDER — METHYLPHENIDATE HCL ER (LA) 40 MG PO CP24: 40.0000 mg | ORAL_CAPSULE | ORAL | 0 refills | Status: DC

## 2016-07-16 NOTE — Telephone Encounter (Signed)
Patient is calling to request a refill of methylphenidate (RITALIN LA) 40 MG 24 hr capsule Please advise   Patient phone: [phone]

## 2016-07-17 NOTE — Telephone Encounter (Signed)
Spoke with pt to inform her that her Rx is ready for pick up and will be at the front desk. Pt states she will pick up Rx tomorrow morning or Thursday morning. LB

## 2016-07-19 ENCOUNTER — Other Ambulatory Visit: Payer: Self-pay

## 2016-07-19 DIAGNOSIS — F988 Other specified behavioral and emotional disorders with onset usually occurring in childhood and adolescence: Secondary | ICD-10-CM

## 2016-07-19 MED ORDER — METHYLPHENIDATE HCL ER (LA) 40 MG PO CP24: 40.0000 mg | ORAL_CAPSULE | ORAL | 0 refills | Status: DC

## 2016-08-29 ENCOUNTER — Ambulatory Visit (INDEPENDENT_AMBULATORY_CARE_PROVIDER_SITE_OTHER): Payer: BLUE CROSS/BLUE SHIELD | Admitting: Medical

## 2016-08-29 ENCOUNTER — Encounter: Payer: Self-pay | Admitting: Medical

## 2016-08-29 VITALS — BP 139/85 | HR 108 | Temp 98.5°F | Resp 16 | Ht 63.0 in | Wt 146.5 lb

## 2016-08-29 DIAGNOSIS — J019 Acute sinusitis, unspecified: Secondary | ICD-10-CM

## 2016-08-29 DIAGNOSIS — R509 Fever, unspecified: Secondary | ICD-10-CM

## 2016-08-29 DIAGNOSIS — R059 Cough, unspecified: Secondary | ICD-10-CM

## 2016-08-29 DIAGNOSIS — R05 Cough: Secondary | ICD-10-CM | POA: Diagnosis not present

## 2016-08-29 MED ORDER — DOXYCYCLINE HYCLATE 100 MG PO TABS: 100.0000 mg | ORAL_TABLET | Freq: Two times a day (BID) | ORAL | 0 refills | Status: DC

## 2016-08-29 MED ORDER — FLUTICASONE PROPIONATE 50 MCG/ACT NA SUSP: 2.0000 | Freq: Every day | NASAL | 1 refills | Status: DC

## 2016-08-29 MED ORDER — BENZONATATE 100 MG PO CAPS: 100.0000 mg | ORAL_CAPSULE | Freq: Three times a day (TID) | ORAL | 0 refills | Status: DC | PRN

## 2016-08-29 NOTE — Progress Notes (Signed)
Pre visit review using our clinic review tool, if applicable. No additional management support is needed unless otherwise documented below in the visit note/SLS  

## 2016-08-29 NOTE — Progress Notes (Signed)
Subjective:    Patient ID: [name], female    DOB: 1976-01-11, 41 y.o.   MRN: [id number]  HPI  Pt in for head congestion, chest congestion and sinus pressure/pain. Some pnd and nonproductive cough. Fever yesterday but  None today. No bodyaches. Pt did not get flu vaccine this year. She has hx of seizure with tamiflu.  LMP- MIrena.  Pt does smoke less than 1/2 pack a day.  Review of Systems  Constitutional: Positive for fatigue and fever. Negative for chills.  HENT: Positive for congestion, sinus pain and sinus pressure. Negative for ear pain, sneezing, sore throat and tinnitus.        Upper and lower teeth hurt this morning.  Respiratory: Positive for cough. Negative for shortness of breath and wheezing.   Cardiovascular: Negative for chest pain and palpitations.  Gastrointestinal: Negative for abdominal pain.  Genitourinary: Negative for dysuria.  Musculoskeletal: Negative for back pain and neck pain.  Skin: Negative for rash.  Neurological: Negative for dizziness.  Hematological: Negative for adenopathy. Does not bruise/bleed easily.  Psychiatric/Behavioral: Negative for behavioral problems and confusion.    Past Medical History:  Diagnosis Date  . Anemia   . Anxiety    h/o panic attack, due to cysts in axilla area & requiring I&D, last episode, 2011,   . 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 Back pain   . 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 GERD (gastroesophageal reflux disease)   . Hypotension    per pt.   . Kidney infection    1998- hosp.- [facility]  . Numbness and tingling    right leg   . Recurrent upper respiratory infection (URI)    viral infection - URI- 10/2011, penicillin treatmernt   . Seizures (HCC)    side effect of Tamiflu     Social History   Social History  . Marital status: Married    Spouse name: N/A  . Number of children: N/A  . Years of education: N/A   Occupational History  . Not on file.   Social History Main Topics  . Smoking status: Current  Every Day Smoker    Packs/day: 0.25    Years: 15.00    Types: Cigarettes  . Smokeless tobacco: Never Used  . Alcohol use 0.0 oz/week     Comment: a glass of wine  on occas.   . Drug use: No  . Sexual activity: Not on file   Other Topics Concern  . Not on file   Social History Narrative  . No narrative on file    Past Surgical History:  Procedure Laterality Date  . ANTERIOR LUMBAR FUSION  11/14/2011   Procedure: ANTERIOR LUMBAR FUSION 2 LEVELS;  Surgeon: Johnn Hai, MD;  Location: [facility];  Service: Orthopedics;  Laterality: N/A;  ALIF L5-S1  . CESAREAN SECTION     2007- /w epidural  anesthesia   . COLONOSCOPY    . LUMBAR LAMINECTOMY/DECOMPRESSION MICRODISCECTOMY Right 12/15/2015   Procedure:  MICO LUMBER DECOMPRESSION L4-5 ON THE RIGHT, ;  Surgeon: Susa Day, MD;  Location: [facility];  Service: Orthopedics;  Laterality: Right;  . SPINE SURGERY     fusion  . WISDOM TOOTH EXTRACTION     young adult    Family History  Problem Relation Age of Onset  . Lupus Mother   . Anesthesia problems Neg Hx   . Colon cancer Neg Hx     Allergies  Allergen Reactions  . Tamiflu [  Oseltamivir Phosphate] Other (See Comments)    Seizure  . Ciprofloxacin Diarrhea and Nausea And Vomiting  . Metoclopramide Hcl Other (See Comments)    "lock jaw"  . Sulfa Antibiotics Nausea And Vomiting    Current Outpatient Prescriptions on File Prior to Visit  Medication Sig Dispense Refill  . docusate sodium (COLACE) 100 MG capsule Take 1 capsule (100 mg total) by mouth 2 (two) times daily. (Patient taking differently: Take 100 mg by mouth daily as needed. ) 40 capsule 1  . gabapentin (NEURONTIN) 100 MG capsule TAKE 1 CAPSULE AT BEDTIME X 3DAYS, 1 CAPSULE TWICE DAILY X 3DAYS, THEN 1 CAP 3 TIMES A DAY THEN ON  0  . levonorgestrel (MIRENA) 20 MCG/24HR IUD 1 each by Intrauterine route once.    . methylphenidate (RITALIN LA) 40 MG 24 hr capsule Take 1 capsule (40 mg total) by mouth every morning. 30 capsule  0  . omeprazole (PRILOSEC) 40 MG capsule Take 1 capsule (40 mg total) by mouth daily. (Patient taking differently: Take 40 mg by mouth 2 (two) times daily. ) 30 capsule 3  . ranitidine (ZANTAC) 300 MG tablet Take 1 tablet (300 mg total) by mouth at bedtime as needed for heartburn. 30 tablet 6  . SOOLANTRA 1 % CREA Apply 1 application topically daily. Reported on 02/22/2016  3  . TURMERIC PO Take by mouth.    . valACYclovir (VALTREX) 1000 MG tablet Take 1 tablet (1,000 mg total) by mouth 3 (three) times daily as needed. For flare ups 90 tablet 2  . aspirin 81 MG tablet Take 81 mg by mouth daily.    . [DISCONTINUED] Norgestim-Eth Estrad Triphasic (ORTHO TRI-CYCLEN, 28, PO) Take 0.035 mg by mouth daily.      No current facility-administered medications on file prior to visit.     BP (!) 152/97 (BP Location: Left Arm, Patient Position: Sitting, Cuff Size: Normal)   Pulse (!) 108   Temp 98.5 F (36.9 C) (Oral)   Resp 16   Ht 5\' 3"  (1.6 m)   Wt 146 lb 8 oz (66.5 kg)   SpO2 100%   BMI 25.95 kg/m       Objective:   Physical Exam  General  Mental Status - Alert. General Appearance - Well groomed. Not in acute distress.  Skin Rashes- No Rashes.  HEENT Head- Normal. Ear Auditory Canal - Left- Normal. Right - Normal.Tympanic Membrane- Left- Normal. Right- Normal. Eye Sclera/Conjunctiva- Left- Normal. Right- Normal. Nose & Sinuses Nasal Mucosa- Left-  Boggy and Congested. Right-  Boggy and  Congested.Bilateral no maxillary and no frontal sinus pressure.(but recent teeth pain today upper and lower teeth) Mouth & Throat Lips: Upper Lip- Normal: no dryness, cracking, pallor, cyanosis, or vesicular eruption. Lower Lip-Normal: no dryness, cracking, pallor, cyanosis or vesicular eruption. Buccal Mucosa- Bilateral- No Aphthous ulcers. Oropharynx- No Discharge or Erythema. Tonsils: Characteristics- Bilateral- No Erythema or Congestion. Size/Enlargement- Bilateral- No enlargement. Discharge-  bilateral-None.  Neck Neck- Supple. No Masses.   Chest and Lung Exam Auscultation: Breath Sounds:-Clear even and unlabored.  Cardiovascular Auscultation:Rythm- Regular, rate and rhythm. Murmurs & Other Heart Sounds:Ausculatation of the heart reveal- No Murmurs.  Lymphatic Head & Neck General Head & Neck Lymphatics: Bilateral: Description- No Localized lymphadenopathy.       Assessment & Plan:  You appear to have a sinus infection(in light of recent teeth pain) and possible bronchitis. I am prescribing doxycycline  antibiotic for the infection. To help with the nasal congestion I prescribed nasal steroid  flonase. For your associated cough, I prescribed cough medicine benzonatate.  Your flu test was negative.   Rest, hydrate, tylenol for fever.  Follow up in 7 days or as needed.   Ashrith Sagan, Percell Miller, PA-C

## 2016-08-29 NOTE — Patient Instructions (Addendum)
You appear to have a sinus infection(in light of recent teeth pain) and possible bronchitis. I am prescribing doxycycline  antibiotic for the infection. To help with the nasal congestion I prescribed nasal steroid flonase. For your associated cough, I prescribed cough medicine benzonatate.  Your flu test was negative.   Rest, hydrate, tylenol for fever.  Follow up in 7 days or as needed.

## 2016-09-17 ENCOUNTER — Telehealth: Payer: Self-pay | Admitting: Family Medicine

## 2016-09-17 NOTE — Telephone Encounter (Signed)
Caller name: Anivea Relation to [phone] Call back number: [phone] Pharmacy:  Reason for call: Pt requesting refill on methylphenidate (RITALIN LA) 40 MG 24 hr capsule. Pt states only has one left for tomorrow. Please advise ASAP.

## 2016-09-18 MED ORDER — METHYLPHENIDATE HCL ER (LA) 40 MG PO CP24: 40.0000 mg | ORAL_CAPSULE | ORAL | 0 refills | Status: DC

## 2016-09-18 NOTE — Telephone Encounter (Signed)
Patient called to follow up on request for refill. Plse adv. Patient is out of medication

## 2016-09-18 NOTE — Telephone Encounter (Signed)
Requesting:   ritalin Contract   09/21/2014 UDS   Low risk --- is due Last OV    01/10/19-17--- no schedule upcoming appts. Last Refill   #30 07/19/2016  Please Advise

## 2016-09-18 NOTE — Telephone Encounter (Signed)
1 month Needs ov

## 2016-09-18 NOTE — Telephone Encounter (Signed)
Printed/PCP signed Called the patient informed to pickup hardcopy at the front desk/and to schedule OV. She agreed to do so.

## 2016-10-10 ENCOUNTER — Other Ambulatory Visit: Payer: Self-pay | Admitting: Gastroenterology

## 2016-10-10 DIAGNOSIS — R1319 Other dysphagia: Secondary | ICD-10-CM

## 2016-10-15 ENCOUNTER — Encounter: Payer: Self-pay | Admitting: Family Medicine

## 2016-10-15 ENCOUNTER — Ambulatory Visit (INDEPENDENT_AMBULATORY_CARE_PROVIDER_SITE_OTHER): Payer: BLUE CROSS/BLUE SHIELD | Admitting: Family Medicine

## 2016-10-15 VITALS — BP 132/90 | HR 102 | Temp 98.7°F | Resp 16 | Ht 63.0 in | Wt 153.4 lb

## 2016-10-15 DIAGNOSIS — F988 Other specified behavioral and emotional disorders with onset usually occurring in childhood and adolescence: Secondary | ICD-10-CM

## 2016-10-15 MED ORDER — GABAPENTIN 100 MG PO CAPS: ORAL_CAPSULE | ORAL | 0 refills | Status: DC

## 2016-10-15 MED ORDER — METHYLPHENIDATE HCL ER (LA) 40 MG PO CP24: 40.0000 mg | ORAL_CAPSULE | ORAL | 0 refills | Status: DC

## 2016-10-15 MED ORDER — ATOMOXETINE HCL 40 MG PO CAPS: 40.0000 mg | ORAL_CAPSULE | Freq: Every day | ORAL | 0 refills | Status: DC

## 2016-10-15 NOTE — Progress Notes (Signed)
Pre visit review using our clinic review tool, if applicable. No additional management support is needed unless otherwise documented below in the visit note. 

## 2016-10-15 NOTE — Progress Notes (Signed)
Subjective:  I acted as a Education administrator for Dr. Royden Purl, LPN    Patient ID: [name], female    DOB: 13-Jun-1976, 41 y.o.   MRN: [id number]  Chief Complaint  Patient presents with  . Follow-up    HPI  Patient is in today for follow up attention deficit disorder.  No complaints.    Patient Care Team: Ann Held, DO as PCP - General Susa Day, MD (Orthopedic Surgery)   Past Medical History:  Diagnosis Date  . Anemia   . Anxiety    h/o panic attack, due to cysts in axilla area & requiring I&D, last episode, 2011,   . 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 Back pain   . 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 GERD (gastroesophageal reflux disease)   . Hypotension    per pt.   . Kidney infection    1998- hosp.- [facility]  . Numbness and tingling    right leg   . Recurrent upper respiratory infection (URI)    viral infection - URI- 10/2011, penicillin treatmernt   . Seizures (HCC)    side effect of Tamiflu    Past Surgical History:  Procedure Laterality Date  . ANTERIOR LUMBAR FUSION  11/14/2011   Procedure: ANTERIOR LUMBAR FUSION 2 LEVELS;  Surgeon: Johnn Hai, MD;  Location: [facility];  Service: Orthopedics;  Laterality: N/A;  ALIF L5-S1  . CESAREAN SECTION     2007- /w epidural  anesthesia   . COLONOSCOPY    . LUMBAR LAMINECTOMY/DECOMPRESSION MICRODISCECTOMY Right 12/15/2015   Procedure:  MICO LUMBER DECOMPRESSION L4-5 ON THE RIGHT, ;  Surgeon: Susa Day, MD;  Location: [facility];  Service: Orthopedics;  Laterality: Right;  . SPINE SURGERY     fusion  . WISDOM TOOTH EXTRACTION     young adult    Family History  Problem Relation Age of Onset  . Lupus Mother   . Anesthesia problems Neg Hx   . Colon cancer Neg Hx     Social History   Social History  . Marital status: Married    Spouse name: N/A  . Number of children: N/A  . Years of education: N/A   Occupational History  . Not on file.   Social History Main Topics  . Smoking status: Current Every Day  Smoker    Packs/day: 0.25    Years: 15.00    Types: Cigarettes  . Smokeless tobacco: Never Used  . Alcohol use 0.0 oz/week     Comment: a glass of wine  on occas.   . Drug use: No  . Sexual activity: Not on file   Other Topics Concern  . Not on file   Social History Narrative  . No narrative on file    Outpatient Medications Prior to Visit  Medication Sig Dispense Refill  . fluticasone (FLONASE) 50 MCG/ACT nasal spray Place 2 sprays into both nostrils daily. 16 g 1  . levonorgestrel (MIRENA) 20 MCG/24HR IUD 1 each by Intrauterine route once.    Marland Kitchen omeprazole (PRILOSEC) 40 MG capsule Take 1 capsule (40 mg total) by mouth daily. (Patient taking differently: Take 40 mg by mouth 2 (two) times daily. ) 30 capsule 3  . ranitidine (ZANTAC) 300 MG tablet Take 1 tablet (300 mg total) by mouth at bedtime as needed for heartburn. 30 tablet 6  . SOOLANTRA 1 % CREA Apply 1 application topically daily. Reported on 02/22/2016  3  . TURMERIC PO  Take by mouth.    . valACYclovir (VALTREX) 1000 MG tablet Take 1 tablet (1,000 mg total) by mouth 3 (three) times daily as needed. For flare ups 90 tablet 2  . benzonatate (TESSALON) 100 MG capsule Take 1 capsule (100 mg total) by mouth 3 (three) times daily as needed for cough. 21 capsule 0  . docusate sodium (COLACE) 100 MG capsule Take 1 capsule (100 mg total) by mouth 2 (two) times daily. (Patient taking differently: Take 100 mg by mouth daily as needed. ) 40 capsule 1  . doxycycline (VIBRA-TABS) 100 MG tablet Take 1 tablet (100 mg total) by mouth 2 (two) times daily. Can give caps or generic 20 tablet 0  . gabapentin (NEURONTIN) 100 MG capsule TAKE 1 CAPSULE AT BEDTIME X 3DAYS, 1 CAPSULE TWICE DAILY X 3DAYS, THEN 1 CAP 3 TIMES A DAY THEN ON  0  . methylphenidate (RITALIN LA) 40 MG 24 hr capsule Take 1 capsule (40 mg total) by mouth every morning. 30 capsule 0  . omeprazole (PRILOSEC) 40 MG capsule TAKE ONE CAPSULE BY MOUTH TWICE A DAY 30 MINUTES BEFORE  BREAKFAST AND DINNER 60 capsule 6  . aspirin 81 MG tablet Take 81 mg by mouth daily.     No facility-administered medications prior to visit.     Allergies  Allergen Reactions  . Tamiflu [Oseltamivir Phosphate] Other (See Comments)    Seizure  . Ciprofloxacin Diarrhea and Nausea And Vomiting  . Metoclopramide Hcl Other (See Comments)    "lock jaw"  . Sulfa Antibiotics Nausea And Vomiting    Review of Systems  Constitutional: Negative for fever.  HENT: Negative for congestion.   Eyes: Negative for blurred vision.  Respiratory: Negative for cough.   Cardiovascular: Negative for chest pain and palpitations.  Gastrointestinal: Negative for vomiting.  Musculoskeletal: Negative for back pain.  Skin: Negative for rash.  Neurological: Negative for loss of consciousness and headaches.       Objective:    Physical Exam  Constitutional: She is oriented to person, place, and time. She appears well-developed and well-nourished. No distress.  HENT:  Head: Normocephalic and atraumatic.  Eyes: Conjunctivae are normal. Pupils are equal, round, and reactive to light.  Neck: Normal range of motion. No thyromegaly present.  Cardiovascular: Normal rate and regular rhythm.   Pulmonary/Chest: Effort normal and breath sounds normal. She has no wheezes.  Abdominal: Soft. Bowel sounds are normal. There is no tenderness.  Musculoskeletal: Normal range of motion. She exhibits no edema or deformity.  Neurological: She is alert and oriented to person, place, and time.  Skin: Skin is warm and dry. She is not diaphoretic.  Psychiatric: She has a normal mood and affect.    BP 132/90 (BP Location: Left Arm, Patient Position: Sitting, Cuff Size: Normal)   Pulse (!) 102   Temp 98.7 F (37.1 C) (Oral)   Resp 16   Ht 5\' 3"  (1.6 m)   Wt 153 lb 6.4 oz (69.6 kg)   SpO2 97%   BMI 27.17 kg/m  Wt Readings from Last 3 Encounters:  10/15/16 153 lb 6.4 oz (69.6 kg)  08/29/16 146 lb 8 oz (66.5 kg)    02/22/16 144 lb (65.3 kg)     Lab Results  Component Value Date   WBC 6.0 01/10/2016   HGB 13.2 01/10/2016   HCT 38.7 01/10/2016   PLT 242.0 01/10/2016   GLUCOSE 108 (H) 01/10/2016   ALT 27 01/10/2016   AST 33 01/10/2016   NA  137 01/10/2016   K 4.5 01/10/2016   CL 100 01/10/2016   CREATININE 0.74 01/10/2016   BUN 9 01/10/2016   CO2 29 01/10/2016   INR 1.07 11/08/2011    No results found for: TSH Lab Results  Component Value Date   WBC 6.0 01/10/2016   HGB 13.2 01/10/2016   HCT 38.7 01/10/2016   MCV 94.0 01/10/2016   PLT 242.0 01/10/2016   Lab Results  Component Value Date   NA 137 01/10/2016   K 4.5 01/10/2016   CO2 29 01/10/2016   GLUCOSE 108 (H) 01/10/2016   BUN 9 01/10/2016   CREATININE 0.74 01/10/2016   BILITOT 0.4 01/10/2016   ALKPHOS 36 (L) 01/10/2016   AST 33 01/10/2016   ALT 27 01/10/2016   PROT 7.8 01/10/2016   ALBUMIN 4.5 01/10/2016   CALCIUM 9.3 01/10/2016   ANIONGAP 11 12/16/2015   GFR 92.53 01/10/2016   No results found for: CHOL No results found for: HDL No results found for: LDLCALC No results found for: TRIG No results found for: CHOLHDL No results found for: HGBA1C     Assessment & Plan:   Problem List Items Addressed This Visit      Unprioritized   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 Mellinger's docusate sodium, doxycycline, and benzonatate. I am also having her start on atomoxetine. Additionally, I am having her maintain her levonorgestrel, valACYclovir, SOOLANTRA, omeprazole, aspirin, TURMERIC PO, ranitidine, fluticasone, gabapentin, methylphenidate, and methylphenidate.  Meds ordered this encounter  Medications  . atomoxetine (STRATTERA) 40 MG capsule     Sig: Take 1 capsule (40 mg total) by mouth daily.    Dispense:  60 capsule    Refill:  0  . gabapentin (NEURONTIN) 100 MG capsule    Sig: TAKE 1 CAPSULE AT BEDTIME X 3DAYS, 1 CAPSULE TWICE DAILY X 3DAYS, THEN 1 CAP 3 TIMES A DAY THEN ON    Dispense:  90 capsule    Refill:  0  . DISCONTD: methylphenidate (RITALIN LA) 40 MG 24 hr capsule    Sig: Take 1 capsule (40 mg total) by mouth every morning.    Dispense:  30 capsule    Refill:  0    Do not fill until February 2018  . methylphenidate (RITALIN LA) 40 MG 24 hr capsule    Sig: Take 1 capsule (40 mg total) by mouth every morning.    Dispense:  30 capsule    Refill:  0    Do not fill until April 2018  . methylphenidate (RITALIN LA) 40 MG 24 hr capsule    Sig: Take 1 capsule (40 mg total) by mouth every morning.    Dispense:  30 capsule    Refill:  0    Do not fill until March 2018    [place] served as Education administrator during this visit. History, Physical and Plan performed by medical provider. Documentation and orders reviewed and attested to.  Ann Held, DO   Patient ID: [name], female   DOB: Apr 26, 1976, 41 y.o.   MRN: [id number]

## 2016-10-15 NOTE — Patient Instructions (Signed)
Atomoxetine capsules  What is this medicine?  ATOMOXETINE (AT oh mox e teen) is used to treat attention deficit/hyperactivity disorder, also known as ADHD. It is not a stimulant like other drugs for ADHD. This drug can improve attention span, concentration, and emotional control. It can also reduce restless or overactive behavior.  This medicine may be used for other purposes; ask your health care provider or pharmacist if you have questions.  COMMON BRAND NAME(S): Strattera  What should I tell my health care provider before I take this medicine?  They need to know if you have any of these conditions:  -glaucoma  -high or low blood pressure  -history of stroke  -irregular heartbeat or other cardiac disease  -liver disease  -mania or bipolar disorder  -pheochromocytoma  -suicidal thoughts  -an unusual or allergic reaction to atomoxetine, other medicines, foods, dyes, or preservatives  -pregnant or trying to get pregnant  -breast-feeding  How should I use this medicine?  Take this medicine by mouth with a glass of water. Follow the directions on the prescription label. You can take it with or without food. If it upsets your stomach, take it with food. If you have difficulty sleeping and you take more than 1 dose per day, take your last dose before 6 PM. Take your medicine at regular intervals. Do not take it more often than directed. Do not stop taking except on your doctor's advice.  A special MedGuide will be given to you by the pharmacist with each prescription and refill. Be sure to read this information carefully each time.  Talk to your pediatrician regarding the use of this medicine in children. While this drug may be prescribed for children as young as 6 years for selected conditions, precautions do apply.  Overdosage: If you think you have taken too much of this medicine contact a poison control center or emergency room at once.  NOTE: This medicine is only for you. Do not share this medicine with  others.  What if I miss a dose?  If you miss a dose, take it as soon as you can. If it is almost time for your next dose, take only that dose. Do not take double or extra doses.  What may interact with this medicine?  Do not take this medicine with any of the following medications:  -cisapride  -dofetilide  -dronedarone  -MAOIs like Carbex, Eldepryl, Marplan, Nardil, and Parnate  -pimozide  -reboxetine  -thioridazine  -ziprasidone  This medicine may also interact with the following medications:  -certain medicines for blood pressure, heart disease, irregular heart beat  -certain medicines for depression, anxiety, or psychotic disturbances  -certain medicines for lung disease like albuterol  -cold or allergy medicines  -fluoxetine  -medicines that increase blood pressure like dopamine, dobutamine, or ephedrine  -other medicines that prolong the QT interval (cause an abnormal heart rhythm)  -paroxetine  -quinidine  -stimulant medicines for attention disorders, weight loss, or to stay awake  This list may not describe all possible interactions. Give your health care provider a list of all the medicines, herbs, non-prescription drugs, or dietary supplements you use. Also tell them if you smoke, drink alcohol, or use illegal drugs. Some items may interact with your medicine.  What should I watch for while using this medicine?  It may take a week or more for this medicine to take effect. This is why it is very important to continue taking the medicine and not miss any doses. If you have   or teenager has new or increased thoughts of suicide or has changes in mood or behavior like becoming irritable or anxious. Regularly  monitor your child for these behavioral changes. For males, contact you doctor or health care professional right away if you have an erection that lasts longer than 4 hours or if it becomes painful. This may be a sign of serious problem and must be treated right away to prevent permanent damage. You may get drowsy or dizzy. Do not drive, use machinery, or do anything that needs mental alertness until you know how this medicine affects you. Do not stand or sit up quickly, especially if you are an older patient. This reduces the risk of dizzy or fainting spells. Alcohol can make you more drowsy and dizzy. Avoid alcoholic drinks. Do not treat yourself for coughs, colds or allergies without asking your doctor or health care professional for advice. Some ingredients can increase possible side effects. Your mouth may get dry. Chewing sugarless gum or sucking hard candy, and drinking plenty of water will help. What side effects may I notice from receiving this medicine? Side effects that you should report to your doctor or health care professional as soon as possible: -allergic reactions like skin rash, itching or hives, swelling of the face, lips, or tongue -breathing problems -chest pain -dark urine -fast, irregular heartbeat -general ill feeling or flu-like symptoms -high blood pressure -males: prolonged or painful erection -stomach pain or tenderness -trouble passing urine or change in the amount of urine -vomiting -weight loss -yellowing of the eyes or skin Side effects that usually do not require medical attention (report to your doctor or health care professional if they continue or are bothersome): -change in sex drive or performance -constipation or diarrhea -headache -loss of appetite -menstrual period irregularities -nausea -stomach upset This list may not describe all possible side effects. Call your doctor for medical advice about side effects. You may report side effects to FDA at  1-800-FDA-1088. Where should I keep my medicine? Keep out of the reach of children. Store at room temperature between 15 and 30 degrees C (59 and 86 degrees F). Throw away any unused medication after the expiration date. NOTE: This sheet is a summary. It may not cover all possible information. If you have questions about this medicine, talk to your doctor, pharmacist, or health care provider.  2018 Elsevier/Gold Standard (2013-12-04 15:29:22)  

## 2016-10-16 ENCOUNTER — Encounter: Payer: Self-pay | Admitting: Family Medicine

## 2016-10-16 DIAGNOSIS — Z79899 Other long term (current) drug therapy: Secondary | ICD-10-CM | POA: Diagnosis not present

## 2016-10-19 ENCOUNTER — Telehealth: Payer: Self-pay | Admitting: Family Medicine

## 2016-10-19 NOTE — Telephone Encounter (Signed)
Error

## 2016-10-22 ENCOUNTER — Ambulatory Visit: Payer: BLUE CROSS/BLUE SHIELD | Admitting: Family Medicine

## 2016-10-31 ENCOUNTER — Telehealth: Payer: Self-pay | Admitting: Family Medicine

## 2016-10-31 MED ORDER — ATOMOXETINE HCL 40 MG PO CAPS
40.0000 mg | ORAL_CAPSULE | Freq: Two times a day (BID) | ORAL | 2 refills | Status: DC
Start: 2016-10-31 — End: 2017-01-26

## 2016-10-31 NOTE — Telephone Encounter (Signed)
80 mg daily--- - she is supposed to take it with food but it will cause nausea it taken on an empty stomach #30  2 refills

## 2016-10-31 NOTE — Telephone Encounter (Signed)
Spoke to the patient She took 40 mg for 4 days Then increased to 2  40 mg per day to equal 80 mg per day. She is questioning to continue 40 mg bid or what?? She is doing well on that dose, takes with food as causes a little nausea if doesn't/ Needs a refill has only enough left for 2 days.

## 2016-10-31 NOTE — Telephone Encounter (Signed)
Called to clarify her question and then route to PCP Left her a message to call back with information

## 2016-10-31 NOTE — Telephone Encounter (Signed)
Pt called in to request a refill on her STRATTERA Rx. Pt says that she would also like to be advised on the her instruction before taking it.   Pt is requesting a call back also.     [facility] [place]    CB: [phone]

## 2016-10-31 NOTE — Telephone Encounter (Signed)
Patient informed and sent in to pharmacy.

## 2016-12-26 ENCOUNTER — Encounter: Payer: Self-pay | Admitting: Gastroenterology

## 2017-01-08 DIAGNOSIS — Z01419 Encounter for gynecological examination (general) (routine) without abnormal findings: Secondary | ICD-10-CM | POA: Diagnosis not present

## 2017-01-08 DIAGNOSIS — Z1231 Encounter for screening mammogram for malignant neoplasm of breast: Secondary | ICD-10-CM | POA: Diagnosis not present

## 2017-01-08 DIAGNOSIS — Z6826 Body mass index (BMI) 26.0-26.9, adult: Secondary | ICD-10-CM | POA: Diagnosis not present

## 2017-01-26 ENCOUNTER — Other Ambulatory Visit: Payer: Self-pay | Admitting: Family Medicine

## 2017-01-27 ENCOUNTER — Other Ambulatory Visit: Payer: Self-pay | Admitting: Family Medicine

## 2017-02-01 ENCOUNTER — Other Ambulatory Visit: Payer: Self-pay | Admitting: Family Medicine

## 2017-02-05 ENCOUNTER — Encounter: Payer: Self-pay | Admitting: Gastroenterology

## 2017-02-23 ENCOUNTER — Other Ambulatory Visit: Payer: Self-pay | Admitting: Family Medicine

## 2017-02-25 ENCOUNTER — Telehealth: Payer: Self-pay | Admitting: Family Medicine

## 2017-02-25 NOTE — Telephone Encounter (Signed)
Left message on vm that rx was sent in and that she needs to make a follow up appt before running out.

## 2017-02-25 NOTE — Telephone Encounter (Signed)
Relation to pt: self Call back [phone] Pharmacy: [id number] [place], [place] [phone] (Phone) [phone] (Fax)     Reason for call:  Patient requesting a 3 month supply of atomoxetine (STRATTERA) 40 MG capsule,patient states she called in her pharmacy Saturday and she's completley out, please advise

## 2017-03-05 ENCOUNTER — Ambulatory Visit (AMBULATORY_SURGERY_CENTER): Payer: Self-pay | Admitting: *Deleted

## 2017-03-05 VITALS — Ht 63.0 in | Wt 144.0 lb

## 2017-03-05 DIAGNOSIS — K2270 Barrett's esophagus without dysplasia: Secondary | ICD-10-CM

## 2017-03-05 NOTE — Progress Notes (Signed)
No egg or soy allergy known to patient  No issues with past sedation with any surgeries  or procedures, no intubation problems  No diet pills per patient No home 02 use per patient  No blood thinners per patient  Pt denies issues with constipation  No A fib or A flutter  EMMI video sent to pt's e mail pt declined   

## 2017-03-09 ENCOUNTER — Other Ambulatory Visit: Payer: Self-pay | Admitting: Family Medicine

## 2017-03-11 ENCOUNTER — Encounter: Payer: Self-pay | Admitting: Gastroenterology

## 2017-03-11 NOTE — Telephone Encounter (Signed)
Reviewed pt rx prescription refill of request for gapatentin. Refilled the med for one month.

## 2017-03-18 ENCOUNTER — Ambulatory Visit (AMBULATORY_SURGERY_CENTER): Payer: BLUE CROSS/BLUE SHIELD | Admitting: Gastroenterology

## 2017-03-18 ENCOUNTER — Encounter: Payer: Self-pay | Admitting: Gastroenterology

## 2017-03-18 VITALS — BP 135/93 | HR 96 | Temp 98.2°F | Resp 12 | Ht 63.0 in | Wt 153.0 lb

## 2017-03-18 DIAGNOSIS — K22719 Barrett's esophagus with dysplasia, unspecified: Secondary | ICD-10-CM

## 2017-03-18 DIAGNOSIS — K219 Gastro-esophageal reflux disease without esophagitis: Secondary | ICD-10-CM | POA: Diagnosis not present

## 2017-03-18 DIAGNOSIS — K2950 Unspecified chronic gastritis without bleeding: Secondary | ICD-10-CM | POA: Diagnosis not present

## 2017-03-18 DIAGNOSIS — K2270 Barrett's esophagus without dysplasia: Secondary | ICD-10-CM | POA: Diagnosis not present

## 2017-03-18 MED ORDER — OMEPRAZOLE 40 MG PO CPDR: 40.0000 mg | DELAYED_RELEASE_CAPSULE | Freq: Two times a day (BID) | ORAL | 11 refills | Status: DC

## 2017-03-18 MED ORDER — SODIUM CHLORIDE 0.9 % IV SOLN: 500.0000 mL | INTRAVENOUS | Status: DC

## 2017-03-18 NOTE — Op Note (Signed)
[facility] Patient Name: [name] Procedure Date: 03/18/2017 10:05 AM MRN: [id number] Endoscopist: Mauri Pole , MD Age: 41 Referring MD:  Date of Birth: [birth year] Gender: Female Account #: [id number] Procedure:                Upper GI endoscopy Indications:              Surveillance for malignancy due to personal history                            of Barrett's esophagus Medicines:                Monitored Anesthesia Care Procedure:                Pre-Anesthesia Assessment:                           - Prior to the procedure, a History and Physical                            was performed, and patient medications and                            allergies were reviewed. The patient's tolerance of                            previous anesthesia was also reviewed. The risks                            and benefits of the procedure and the sedation                            options and risks were discussed with the patient.                            All questions were answered, and informed consent                            was obtained. Prior Anticoagulants: The patient has                            taken no previous anticoagulant or antiplatelet                            agents. ASA Grade Assessment: II - A patient with                            mild systemic disease. After reviewing the risks                            and benefits, the patient was deemed in                            satisfactory condition to undergo the procedure.  After obtaining informed consent, the endoscope was                            passed under direct vision. Throughout the                            procedure, the patient's blood pressure, pulse, and                            oxygen saturations were monitored continuously. The                            Endoscope was introduced through the mouth, and                            advanced to the second  part of duodenum. The upper                            GI endoscopy was accomplished without difficulty.                            The patient tolerated the procedure well. Scope In: Scope Out: Findings:                 The esophagus and gastroesophageal junction were                            examined with white light and narrow band imaging                            (NBI) from a forward view and retroflexed position.                            There were esophageal mucosal changes secondary to                            established long-segment Barrett's disease. These                            changes involved the mucosa at the upper extent of                            the gastric folds (35 cm from the incisors)                            extending to the Z-line (25 cm from the incisors).                            Circumferential salmon-colored mucosa was present                            from 27 to 35 cm and two tongues of salmon-colored  mucosa were present from 25 to 27 cm. The maximum                            longitudinal extent of these esophageal mucosal                            changes was 10 cm in length. Mucosa was biopsied                            with a cold forceps for histology. A total of 5                            specimen bottles were sent to pathology.                           A 2 cm hiatal hernia was present.                           Scattered mild inflammation characterized by                            congestion (edema), erosions, granularity and mucus                            was found in the entire examined stomach. Biopsies                            were taken with a cold forceps for Helicobacter                            pylori testing.                           The examined duodenum was normal. Complications:            No immediate complications. Estimated Blood Loss:     Estimated blood loss was  minimal. Impression:               - Esophageal mucosal changes secondary to                            established long-segment Barrett's disease.                            Biopsied.                           - 2 cm hiatal hernia.                           - Gastritis. Biopsied.                           - Normal examined duodenum. Recommendation:           - Patient has a contact number available for  emergencies. The signs and symptoms of potential                            delayed complications were discussed with the                            patient. Return to normal activities tomorrow.                            Written discharge instructions were provided to the                            patient.                           - Resume previous diet.                           - Continue present medications.                           - Use Prilosec (omeprazole) 40 mg PO BID                            indefinitely.                           - No ibuprofen, naproxen, or other non-steroidal                            anti-inflammatory drugs for 2 weeks.                           - Follow an antireflux regimen. Mauri Pole, MD 03/18/2017 10:39:34 AM This report has been signed electronically.

## 2017-03-18 NOTE — Progress Notes (Signed)
Called to room to assist during endoscopic procedure.  Patient ID and intended procedure confirmed with present staff. Received instructions for my participation in the procedure from the performing physician.  

## 2017-03-18 NOTE — Patient Instructions (Signed)
Impression/Recommendations:  Barrett's handout given to patient. Hiatal hernia handout given to patient. Gastritis handout given to patient. GERD handout given to patient.  Resume previous diet. Continue present medications.  Use Prilosec (Omeprazole) 40 mg by mouth 2 times daily indefinitely.  No Ibuprofen, naproxen, or other NSAID drugs for 2 weeks.  Tylenol only until 04/02/17.  Follow antireflux regimen.  YOU HAD AN ENDOSCOPIC PROCEDURE TODAY AT [facility]:   Refer to the procedure report that was given to you for any specific questions about what was found during the examination.  If the procedure report does not answer your questions, please call your gastroenterologist to clarify.  If you requested that your care partner not be given the details of your procedure findings, then the procedure report has been included in a sealed envelope for you to review at your convenience later.  YOU SHOULD EXPECT: Some feelings of bloating in the abdomen. Passage of more gas than usual.  Walking can help get rid of the air that was put into your GI tract during the procedure and reduce the bloating. If you had a lower endoscopy (such as a colonoscopy or flexible sigmoidoscopy) you may notice spotting of blood in your stool or on the toilet paper. If you underwent a bowel prep for your procedure, you may not have a normal bowel movement for a few days.  Please Note:  You might notice some irritation and congestion in your nose or some drainage.  This is from the oxygen used during your procedure.  There is no need for concern and it should clear up in a day or so.  SYMPTOMS TO REPORT IMMEDIATELY:  Following upper endoscopy (EGD)  Vomiting of blood or coffee ground material  New chest pain or pain under the shoulder blades  Painful or persistently difficult swallowing  New shortness of breath  Fever of 100F or higher  Black, tarry-looking stools  For urgent or emergent  issues, a gastroenterologist can be reached at any hour by calling [phone].   DIET:  We do recommend a small meal at first, but then you may proceed to your regular diet.  Drink plenty of fluids but you should avoid alcoholic beverages for 24 hours.  ACTIVITY:  You should plan to take it easy for the rest of today and you should NOT DRIVE or use heavy machinery until tomorrow (because of the sedation medicines used during the test).    FOLLOW UP: Our staff will call the number listed on your records the next business day following your procedure to check on you and address any questions or concerns that you may have regarding the information given to you following your procedure. If we do not reach you, we will leave a message.  However, if you are feeling well and you are not experiencing any problems, there is no need to return our call.  We will assume that you have returned to your regular daily activities without incident.  If any biopsies were taken you will be contacted by phone or by letter within the next 1-3 weeks.  Please call us at [phone] if you have not heard about the biopsies in 3 weeks.    SIGNATURES/CONFIDENTIALITY: You and/or your care partner have signed paperwork which will be entered into your electronic medical record.  These signatures attest to the fact that that the information above on your After Visit Summary has been reviewed and is understood.  Full responsibility of the confidentiality  of this discharge information lies with you and/or your care-partner.

## 2017-03-18 NOTE — Progress Notes (Signed)
No changes in medical or surgical hx per pt since PV 

## 2017-03-18 NOTE — Progress Notes (Signed)
Report given to PACU, vss 

## 2017-03-19 ENCOUNTER — Telehealth: Payer: Self-pay | Admitting: *Deleted

## 2017-03-19 NOTE — Telephone Encounter (Signed)
  Follow up Call-  Call back number 03/18/2017 02/22/2016  Post procedure Call Back phone  # [phone]  Permission to leave phone message Yes Yes  Some recent data might be hidden     Patient questions:  Do you have a fever, pain , or abdominal swelling? Yes.   Pain Score  0 *  Have you tolerated food without any problems? Yes.    Have you been able to return to your normal activities? Yes.    Do you have any questions about your discharge instructions: Diet   No. Medications  No. Follow up visit  No.  Do you have questions or concerns about your Care? No.  Actions: * If pain score is 4 or above: No action needed, pain <4. Patient stating she had a low grade fever yesterday, yet below 100. Patient afebrile today.

## 2017-03-22 ENCOUNTER — Encounter: Payer: Self-pay | Admitting: Medical

## 2017-03-22 ENCOUNTER — Telehealth: Payer: Self-pay | Admitting: Family Medicine

## 2017-03-22 ENCOUNTER — Ambulatory Visit (INDEPENDENT_AMBULATORY_CARE_PROVIDER_SITE_OTHER): Payer: BLUE CROSS/BLUE SHIELD | Admitting: Medical

## 2017-03-22 ENCOUNTER — Emergency Department (HOSPITAL_BASED_OUTPATIENT_CLINIC_OR_DEPARTMENT_OTHER)
Admission: EM | Admit: 2017-03-22 | Discharge: 2017-03-22 | Disposition: A | Discharge status: Home / Self Care | Payer: BLUE CROSS/BLUE SHIELD | Attending: Emergency Medicine | Admitting: Emergency Medicine

## 2017-03-22 ENCOUNTER — Encounter (HOSPITAL_BASED_OUTPATIENT_CLINIC_OR_DEPARTMENT_OTHER): Payer: Self-pay | Admitting: *Deleted

## 2017-03-22 ENCOUNTER — Emergency Department (HOSPITAL_BASED_OUTPATIENT_CLINIC_OR_DEPARTMENT_OTHER): Payer: BLUE CROSS/BLUE SHIELD

## 2017-03-22 VITALS — BP 115/77 | HR 66 | Temp 97.6°F | Ht 63.0 in | Wt 140.8 lb

## 2017-03-22 DIAGNOSIS — I959 Hypotension, unspecified: Secondary | ICD-10-CM | POA: Diagnosis not present

## 2017-03-22 DIAGNOSIS — K529 Noninfective gastroenteritis and colitis, unspecified: Secondary | ICD-10-CM | POA: Diagnosis not present

## 2017-03-22 DIAGNOSIS — R5383 Other fatigue: Secondary | ICD-10-CM | POA: Diagnosis not present

## 2017-03-22 DIAGNOSIS — Z79899 Other long term (current) drug therapy: Secondary | ICD-10-CM | POA: Diagnosis not present

## 2017-03-22 DIAGNOSIS — F17210 Nicotine dependence, cigarettes, uncomplicated: Secondary | ICD-10-CM | POA: Diagnosis not present

## 2017-03-22 DIAGNOSIS — R197 Diarrhea, unspecified: Secondary | ICD-10-CM | POA: Diagnosis not present

## 2017-03-22 DIAGNOSIS — K921 Melena: Secondary | ICD-10-CM | POA: Diagnosis not present

## 2017-03-22 DIAGNOSIS — K625 Hemorrhage of anus and rectum: Secondary | ICD-10-CM

## 2017-03-22 DIAGNOSIS — R1084 Generalized abdominal pain: Secondary | ICD-10-CM | POA: Diagnosis not present

## 2017-03-22 LAB — CBC WITH DIFFERENTIAL/PLATELET
BASOS ABS: 0 10*3/uL (ref 0.0–0.1)
Basophils Relative: 0 %
EOS PCT: 0 %
Eosinophils Absolute: 0 10*3/uL (ref 0.0–0.7)
HCT: 37.6 % (ref 36.0–46.0)
Hemoglobin: 12.5 g/dL (ref 12.0–15.0)
LYMPHS PCT: 7 %
Lymphs Abs: 0.6 10*3/uL — ABNORMAL LOW (ref 0.7–4.0)
MCH: 31.3 pg (ref 26.0–34.0)
MCHC: 33.2 g/dL (ref 30.0–36.0)
MCV: 94 fL (ref 78.0–100.0)
MONO ABS: 0.6 10*3/uL (ref 0.1–1.0)
MONOS PCT: 6 %
Neutro Abs: 7.9 10*3/uL — ABNORMAL HIGH (ref 1.7–7.7)
Neutrophils Relative %: 87 %
PLATELETS: 167 10*3/uL (ref 150–400)
RBC: 4 MIL/uL (ref 3.87–5.11)
RDW: 12.4 % (ref 11.5–15.5)
WBC: 9.1 10*3/uL (ref 4.0–10.5)

## 2017-03-22 LAB — COMPREHENSIVE METABOLIC PANEL
ALT: 262 U/L — ABNORMAL HIGH (ref 14–54)
ANION GAP: 13 (ref 5–15)
AST: 173 U/L — AB (ref 15–41)
Albumin: 5.1 g/dL — ABNORMAL HIGH (ref 3.5–5.0)
Alkaline Phosphatase: 35 U/L — ABNORMAL LOW (ref 38–126)
BUN: 17 mg/dL (ref 6–20)
CHLORIDE: 99 mmol/L — AB (ref 101–111)
CO2: 27 mmol/L (ref 22–32)
Calcium: 8.9 mg/dL (ref 8.9–10.3)
Creatinine, Ser: 0.59 mg/dL (ref 0.44–1.00)
Glucose, Bld: 136 mg/dL — ABNORMAL HIGH (ref 65–99)
POTASSIUM: 3.7 mmol/L (ref 3.5–5.1)
Sodium: 139 mmol/L (ref 135–145)
TOTAL PROTEIN: 8.1 g/dL (ref 6.5–8.1)
Total Bilirubin: 0.8 mg/dL (ref 0.3–1.2)

## 2017-03-22 LAB — POCT CBG (FASTING - GLUCOSE)-MANUAL ENTRY: Glucose Fasting, POC: 157 mg/dL — AB (ref 70–99)

## 2017-03-22 MED ORDER — METRONIDAZOLE 500 MG PO TABS: 500.0000 mg | ORAL_TABLET | Freq: Two times a day (BID) | ORAL | 0 refills | Status: DC

## 2017-03-22 MED ORDER — METRONIDAZOLE 500 MG PO TABS
500.0000 mg | ORAL_TABLET | Freq: Once | ORAL | Status: AC
  Administered 2017-03-22: 500 mg via ORAL
  Filled 2017-03-22: qty 1

## 2017-03-22 MED ORDER — ONDANSETRON HCL 4 MG/2ML IJ SOLN
4.0000 mg | Freq: Once | INTRAMUSCULAR | Status: AC
  Administered 2017-03-22: 4 mg via INTRAVENOUS
  Filled 2017-03-22: qty 2

## 2017-03-22 MED ORDER — SODIUM CHLORIDE 0.9 % IV BOLUS (SEPSIS)
1000.0000 mL | Freq: Once | INTRAVENOUS | Status: AC
Start: 2017-03-22 — End: 2017-03-22
  Administered 2017-03-22: 1000 mL via INTRAVENOUS

## 2017-03-22 MED ORDER — DICYCLOMINE HCL 20 MG PO TABS: 20.0000 mg | ORAL_TABLET | Freq: Two times a day (BID) | ORAL | 0 refills | Status: DC

## 2017-03-22 MED ORDER — DICYCLOMINE HCL 10 MG/ML IM SOLN
20.0000 mg | Freq: Once | INTRAMUSCULAR | Status: AC
  Administered 2017-03-22: 20 mg via INTRAMUSCULAR
  Filled 2017-03-22: qty 2

## 2017-03-22 MED ORDER — AMOXICILLIN-POT CLAVULANATE 875-125 MG PO TABS
1.0000 | ORAL_TABLET | Freq: Once | ORAL | Status: AC
Start: 2017-03-22 — End: 2017-03-22
  Administered 2017-03-22: 1 via ORAL
  Filled 2017-03-22: qty 1

## 2017-03-22 MED ORDER — IOPAMIDOL (ISOVUE-300) INJECTION 61%
100.0000 mL | Freq: Once | INTRAVENOUS | Status: AC | PRN
  Administered 2017-03-22: 100 mL via INTRAVENOUS

## 2017-03-22 MED ORDER — ONDANSETRON 4 MG PO TBDP: 4.0000 mg | ORAL_TABLET | Freq: Three times a day (TID) | ORAL | 0 refills | Status: DC | PRN

## 2017-03-22 MED ORDER — AMOXICILLIN-POT CLAVULANATE 875-125 MG PO TABS: 1.0000 | ORAL_TABLET | Freq: Two times a day (BID) | ORAL | 0 refills | Status: DC

## 2017-03-22 MED FILL — DICYCLOMINE 20 MG TABLET: 20 | 10 days supply | Qty: 20 | Fill #0

## 2017-03-22 MED FILL — ONDANSETRON ODT 4 MG TABLET: 4 | 2 days supply | Qty: 6 | Fill #0

## 2017-03-22 MED FILL — AMOX-CLAV 875-125 MG TABLET: 875-125 | 7 days supply | Qty: 14 | Fill #0

## 2017-03-22 MED FILL — metroNIDAZOLE 500 MG TABS: 500 | 10 days supply | Qty: 20 | Fill #0

## 2017-03-22 NOTE — Progress Notes (Signed)
Pre visit review using our clinic tool,if applicable. No additional management support is needed unless otherwise documented below in the visit note.  

## 2017-03-22 NOTE — ED Notes (Signed)
Patient transported to CT 

## 2017-03-22 NOTE — Telephone Encounter (Signed)
[facility] - Client [name] Patient Name: [name] DOB: 1976-02-23 Initial Comment Caller says, wife blood in stool today, started with severe diarrhea, got very pale, sweating, felt faint. Nurse Assessment Nurse: Dimas Chyle, RN, Dellis Filbert Date/Time Eilene Ghazi Time): 03/22/2017 10:22:02 AM Confirm and document reason for call. If symptomatic, describe symptoms. ---Caller says, wife blood in stool today, started with severe diarrhea, got very pale, sweating, felt faint. Symptoms started this morning. Does the patient have any new or worsening symptoms? ---Yes Will a triage be completed? ---Yes Related visit to physician within the last 2 weeks? ---No Does the PT have any chronic conditions? (i.e. diabetes, asthma, etc.) ---No Is the patient pregnant or possibly pregnant? (Ask all females between the ages of 11-55) ---No Is this a behavioral health or substance abuse call? ---No Guidelines Guideline Title Affirmed Question Affirmed Notes Diarrhea [1] Blood in the stool AND [2] small amount of blood (Exception: only on toilet paper. Reason: diarrhea can cause rectal irritation with blood on wiping) Final Disposition User See Physician within [place], RN, [employer] Referrals REFERRED TO PCP OFFICE Disagree/Comply: Leta Baptist

## 2017-03-22 NOTE — Patient Instructions (Signed)
With your fatigue,  severe abdomen pain this am, near syncope, recent GI procedure, rectal bleeding and hx of colitis, I do think best for you to be evaluated at the ED.   I have talked with ED MD and notified him of your recent signs and symptoms.  Follow up her post ED evaluation as they determine

## 2017-03-22 NOTE — ED Triage Notes (Addendum)
Pt c/o abd pain and rectal bleeding x 3 hrs Endo x 5 days ago sent here from PMD office for eval

## 2017-03-22 NOTE — Discharge Instructions (Signed)
Bloody diarrhea is likely for bacterial infection. Finish antibiotics as prescribed. Liver enzymes were slightly elevated today. Recommend these be rechecked with her physician after completion of this current illness.

## 2017-03-22 NOTE — ED Provider Notes (Signed)
[facility]PT [facility] Provider Note   CSN: [id number] Arrival date & time: 03/22/17  1310     History   Chief Complaint Chief Complaint  Patient presents with  . Abdominal Pain    HPI [name] is a 41 y.o. female.Chief complaint is abdominal pain, diarrhea, bloody diarrhea.  HPI 41 year old female with one previous episode of colitis 10 years ago. Had follow-up colonoscopy that did not demonstrate chronic condition for inflammatory bowel disorder.  4 days ago, on Monday she underwent an EGD as maintenance surveillance history of Barrett's esophagus. No stigmata of bleeding. Awaiting biopsy results. No other reported abnormalities or complications.  She had some abdominal cramping today starting at [facility]. She went to the bathroom and was in for a proximal May 30 minutes of multiple episodes of diarrhea followed by some blood in stool. Another episode of bloody diarrhea ensued while here, upstairs being evaluated at the [facility] primary care. Was referred here  Past Medical History:  Diagnosis Date  . Allergy   . Anemia   . Anxiety    h/o panic attack, due to cysts in axilla area & requiring I&D, last episode, 2011,   . 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 Back pain   . 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 GERD (gastroesophageal reflux disease)   . Hypotension    per pt.   . Kidney infection    1998- hosp.- [facility]  . Numbness and tingling    right leg   . Recurrent upper respiratory infection (URI)    viral infection - URI- 10/2011, penicillin treatmernt   . Seizures ([place])    side effect of Tamiflu- 2014 last seizure per pt7-31-18    Patient Active Problem List   Diagnosis Date Noted  . Other dysphagia 02/15/2016  . HNP (herniated nucleus pulposus), lumbar 12/15/2015  . 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 URI (upper respiratory infection) 10/17/2011  . Displacement of intervertebral disc, site unspecified, without myelopathy 09/04/2011  .  Herpes genitalis in women 03/05/2011  . Constipation 01/16/2011  . WRIST PAIN, LEFT 10/17/2010  . CERVICAL LYMPHADENOPATHY 08/15/2010  . HEMATURIA UNSPECIFIED 06/01/2009  . LOW BACK PAIN, CHRONIC 06/01/2009  . MOLE 11/19/2008  . CELLULITIS/ABSCESS NOS 09/30/2008  . GENITAL HERPES, HX OF 11/18/2007  . [place] DISEASE 10/08/2007  . GASTRITIS 06/25/2007  . ABSENCE OF MENSTRUATION 06/25/2007  . BACK PAIN 06/25/2007  . GERD 09/18/2006    Past Surgical History:  Procedure Laterality Date  . ANTERIOR LUMBAR FUSION  11/14/2011   Procedure: ANTERIOR LUMBAR FUSION 2 LEVELS;  Surgeon: Johnn Hai, MD;  Location: [facility];  Service: Orthopedics;  Laterality: N/A;  ALIF L5-S1  . CESAREAN SECTION     2007- /w epidural  anesthesia   . COLONOSCOPY    . LUMBAR LAMINECTOMY/DECOMPRESSION MICRODISCECTOMY Right 12/15/2015   Procedure:  MICO LUMBER DECOMPRESSION L4-5 ON THE RIGHT, ;  Surgeon: Susa Day, MD;  Location: [facility];  Service: Orthopedics;  Laterality: Right;  . SPINE SURGERY     fusion  . UPPER GASTROINTESTINAL ENDOSCOPY    . WISDOM TOOTH EXTRACTION     young adult    OB History    No data available       Home Medications    Prior to Admission medications   Medication Sig Start Date End Date Taking? Authorizing Provider  amoxicillin-clavulanate (AUGMENTIN) 875-125 MG tablet Take 1 tablet by mouth 2 (two) times daily. 03/22/17  Tanna Furry, MD  atomoxetine (STRATTERA) 40 MG capsule TAKE 1 CAPSULE (40 MG TOTAL) BY MOUTH 2 (TWO) TIMES DAILY WITH A MEAL. 02/25/17   Carollee Herter, Alferd Apa, DO  dicyclomine (BENTYL) 20 MG tablet Take 1 tablet (20 mg total) by mouth 2 (two) times daily. 03/22/17   Tanna Furry, MD  gabapentin (NEURONTIN) 100 MG capsule TAKE 1 CAPSULE BY MOUTH THREE TIMES A DAY 03/11/17   Saguier, Percell Miller, PA-C  levonorgestrel (MIRENA) 20 MCG/24HR IUD 1 each by Intrauterine route once.    [provider]  metroNIDAZOLE (FLAGYL) 500 MG tablet Take 1 tablet  (500 mg total) by mouth 2 (two) times daily. 03/22/17   Tanna Furry, MD  omeprazole (PRILOSEC) 40 MG capsule Take 1 capsule (40 mg total) by mouth 2 (two) times daily. 03/18/17   Mauri Pole, MD  ondansetron (ZOFRAN ODT) 4 MG disintegrating tablet Take 1 tablet (4 mg total) by mouth every 8 (eight) hours as needed for nausea. 03/22/17   Tanna Furry, MD  SOOLANTRA 1 % CREA Apply 1 application topically daily. Reported on 02/22/2016 10/26/15   [provider]  TURMERIC PO Take by mouth.    [provider]  valACYclovir (VALTREX) 1000 MG tablet Take 1 tablet (1,000 mg total) by mouth 3 (three) times daily as needed. For flare ups 12/22/14   Ann Held, DO    Family History Family History  Problem Relation Age of Onset  . Lupus Mother   . Breast cancer Maternal Aunt   . Anesthesia problems Neg Hx   . Colon cancer Neg Hx   . Colon polyps Neg Hx   . Esophageal cancer Neg Hx   . Rectal cancer Neg Hx   . Stomach cancer Neg Hx     Social History Social History  Substance Use Topics  . Smoking status: Current Every Day Smoker    Packs/day: 0.25    Years: 15.00    Types: E-cigarettes, Cigarettes  . Smokeless tobacco: Never Used     Comment: 1 cig a day- using e cig now mostly  . Alcohol use 0.0 oz/week     Comment: a glass of wine  on occas.      Allergies   Tamiflu [oseltamivir phosphate]; Ciprofloxacin; Metoclopramide hcl; and Sulfa antibiotics   Review of Systems Review of Systems  Constitutional: Negative for appetite change, chills, diaphoresis, fatigue and fever.  HENT: Negative for mouth sores, sore throat and trouble swallowing.   Eyes: Negative for visual disturbance.  Respiratory: Negative for cough, chest tightness, shortness of breath and wheezing.   Cardiovascular: Negative for chest pain.  Gastrointestinal: Positive for abdominal pain, blood in stool, diarrhea and nausea. Negative for abdominal distention and vomiting.  Endocrine:  Negative for polydipsia, polyphagia and polyuria.  Genitourinary: Negative for dysuria, frequency and hematuria.  Musculoskeletal: Negative for gait problem.  Skin: Negative for color change, pallor and rash.  Neurological: Negative for dizziness, syncope, light-headedness and headaches.  Hematological: Does not bruise/bleed easily.  Psychiatric/Behavioral: Negative for behavioral problems and confusion.     Physical Exam Updated Vital Signs BP 121/81 (BP Location: Right Arm)   Pulse 73   Temp 98.3 F (36.8 C)   Resp 16   Ht 5\' 3"  (1.6 m)   Wt 63.5 kg (140 lb)   SpO2 100%   BMI 24.80 kg/m   Physical Exam  Constitutional: She is oriented to person, place, and time. She appears well-developed and well-nourished. No distress.  HENT:  Head: Normocephalic.  No conjunctival injection. Sclera not icteric. Conjunctiva not pale.  Eyes: Pupils are equal, round, and reactive to light. Conjunctivae are normal. No scleral icterus.  Neck: Normal range of motion. Neck supple. No thyromegaly present.  Cardiovascular: Normal rate and regular rhythm.  Exam reveals no gallop and no friction rub.   No murmur heard. Pulmonary/Chest: Effort normal and breath sounds normal. No respiratory distress. She has no wheezes. She has no rales.  Abdominal: Soft. Bowel sounds are normal. She exhibits no distension. There is no tenderness. There is no rebound.  Soft benign abdomen. No guarding rebound or peritoneal irritation.  Musculoskeletal: Normal range of motion.  Neurological: She is alert and oriented to person, place, and time.  Skin: Skin is warm and dry. No rash noted.  Psychiatric: She has a normal mood and affect. Her behavior is normal.     ED Treatments / Results  Labs (all labs ordered are listed, but only abnormal results are displayed) Labs Reviewed  CBC WITH DIFFERENTIAL/PLATELET - Abnormal; Notable for the following:       Result Value   Neutro Abs 7.9 (*)    Lymphs Abs 0.6 (*)     All other components within normal limits  COMPREHENSIVE METABOLIC PANEL - Abnormal; Notable for the following:    Chloride 99 (*)    Glucose, Bld 136 (*)    Albumin 5.1 (*)    AST 173 (*)    ALT 262 (*)    Alkaline Phosphatase 35 (*)    All other components within normal limits    EKG  EKG Interpretation None       Radiology Ct Abdomen Pelvis W Contrast  Result Date: 03/22/2017 CLINICAL DATA:  41 year old female with blood in stool today. EXAM: CT ABDOMEN AND PELVIS WITH CONTRAST TECHNIQUE: Multidetector CT imaging of the abdomen and pelvis was performed using the standard protocol following bolus administration of intravenous contrast. CONTRAST:  153mL ISOVUE-300 IOPAMIDOL (ISOVUE-300) INJECTION 61% COMPARISON:  None. FINDINGS: Lower chest: No acute abnormality. Hepatobiliary: No focal liver abnormality is seen. No gallstones, gallbladder wall thickening, or biliary dilatation. Pancreas: Unremarkable. No pancreatic ductal dilatation or surrounding inflammatory changes. Spleen: Normal in size without focal abnormality. Adrenals/Urinary Tract: Adrenal glands are unremarkable. Kidneys are normal, without renal calculi, focal lesion, or hydronephrosis. Bladder is unremarkable. Stomach/Bowel: Stomach is within normal limits. Appendix appears normal. No evidence of bowel wall thickening, distention, or inflammatory changes. Vascular/Lymphatic: No significant vascular findings are present. No enlarged abdominal or pelvic lymph nodes. Reproductive: Uterus and bilateral adnexa are unremarkable. Note is made of an intrauterine device. Other: No abdominal wall hernia or abnormality. No abdominopelvic ascites. Musculoskeletal: No acute or significant osseous findings. There are discogenic degenerative changes of the lumbar spine with anterior fusion at L5-S1. IMPRESSION: No acute intra-abdominal process to explain the patient's clinical symptoms. Electronically Signed   By: Kristopher Oppenheim M.D.   On:  03/22/2017 16:10    Procedures Procedures (including critical care time)  Medications Ordered in ED Medications  amoxicillin-clavulanate (AUGMENTIN) 875-125 MG per tablet 1 tablet (not administered)  metroNIDAZOLE (FLAGYL) tablet 500 mg (not administered)  sodium chloride 0.9 % bolus 1,000 mL (0 mLs Intravenous Stopped 03/22/17 1531)  ondansetron (ZOFRAN) injection 4 mg (4 mg Intravenous Given 03/22/17 1413)  dicyclomine (BENTYL) injection 20 mg (20 mg Intramuscular Given 03/22/17 1413)  iopamidol (ISOVUE-300) 61 % injection 100 mL (100 mLs Intravenous Contrast Given 03/22/17 1551)     Initial Impression / Assessment and Plan /  ED Course  I have reviewed the triage vital signs and the nursing notes.  Pertinent labs & imaging results that were available during my care of the patient were reviewed by me and considered in my medical decision making (see chart for details).    Probable colitis. We will CT for confirmation rule out of additional abnormalities. Mild elevation of transaminases. Normal hemoglobin and white blood cell count and platelet. Await further studies. Given IV fluids, Zofran, Bentyl.   CT normal. Pt with less, but occasional bloody, now Mucosy stool. Stable hemodynamics. Likely infectious colitis. LFTs slightly up. Will tx with augmentin and flagyl. Pt to recheck LFTs with PCP  RTER with any additional blood beyond 24 hours, or additional sympaotms Final Clinical Impressions(s) / ED Diagnoses   Final diagnoses:  Colitis    New Prescriptions New Prescriptions   AMOXICILLIN-CLAVULANATE (AUGMENTIN) 875-125 MG TABLET    Take 1 tablet by mouth 2 (two) times daily.   DICYCLOMINE (BENTYL) 20 MG TABLET    Take 1 tablet (20 mg total) by mouth 2 (two) times daily.   METRONIDAZOLE (FLAGYL) 500 MG TABLET    Take 1 tablet (500 mg total) by mouth 2 (two) times daily.   ONDANSETRON (ZOFRAN ODT) 4 MG DISINTEGRATING TABLET    Take 1 tablet (4 mg total) by mouth every 8 (eight)  hours as needed for nausea.     Tanna Furry, MD 03/22/17 [phone]

## 2017-03-22 NOTE — Progress Notes (Signed)
Subjective:    Patient ID: [name], female    DOB: [birth year], 41 y.o.   MRN: [id number]  HPI  Pt in for some pain in her abdomen. Pain is more in her lower abdomen area. Pain was severe today that caused sweating followed by diarrhea. Later she went to bathrroom and had 4 episodes of rectal bleeding.  Ever since this occurred feels week.  Felt fine with acute onset symptoms this am.  She felt so tired at [employer] she layed on bathroom floor.,  Pt had recent endoscopy but no colonoscopy.  Pt had one episode of colitis years ago.   Feels very weak and pale earlier per husband.  Since waiting for me started to feel better.    Review of Systems  Constitutional: Positive for diaphoresis and fatigue. Negative for chills and fever.  Eyes: Negative for redness.  Respiratory: Negative for cough, chest tightness, shortness of breath and wheezing.   Cardiovascular: Negative for chest pain and palpitations.  Gastrointestinal: Positive for abdominal pain, anal bleeding, blood in stool and diarrhea.  Musculoskeletal: Negative for back pain.  Skin: Negative for rash.  Neurological: Negative for dizziness, syncope, speech difficulty, weakness and headaches.  Hematological: Negative for adenopathy. Does not bruise/bleed easily.  Psychiatric/Behavioral: Negative for confusion. The patient is not nervous/anxious.     Past Medical History:  Diagnosis Date  . Allergy   . Anemia   . Anxiety    h/o panic attack, due to cysts in axilla area & requiring I&D, last episode, 2011,   . 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 Back pain   . 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 GERD (gastroesophageal reflux disease)   . Hypotension    per pt.   . Kidney infection    1998- hosp.- [facility]  . Numbness and tingling    right leg   . Recurrent upper respiratory infection (URI)    viral infection - URI- 10/2011, penicillin treatmernt   . Seizures ([place])    side effect of Tamiflu- 2014 last seizure per pt7-31-18      Social History   Social History  . Marital status: Married    Spouse name: N/A  . Number of children: N/A  . Years of education: N/A   Occupational History  . Not on file.   Social History Main Topics  . Smoking status: Current Every Day Smoker    Packs/day: 0.25    Years: 15.00    Types: E-cigarettes, Cigarettes  . Smokeless tobacco: Never Used     Comment: 1 cig a day- using e cig now mostly  . Alcohol use 0.0 oz/week     Comment: a glass of wine  on occas.   . Drug use: No  . Sexual activity: Not on file   Other Topics Concern  . Not on file   Social History Narrative  . No narrative on file    Past Surgical History:  Procedure Laterality Date  . ANTERIOR LUMBAR FUSION  11/14/2011   Procedure: ANTERIOR LUMBAR FUSION 2 LEVELS;  Surgeon: Johnn Hai, MD;  Location: [place];  Service: Orthopedics;  Laterality: N/A;  ALIF L5-S1  . CESAREAN SECTION     2007- /w epidural  anesthesia   . COLONOSCOPY    . LUMBAR LAMINECTOMY/DECOMPRESSION MICRODISCECTOMY Right 12/15/2015   Procedure:  MICO LUMBER DECOMPRESSION L4-5 ON THE RIGHT, ;  Surgeon: Susa Day, MD;  Location: [facility];  Service: Orthopedics;  Laterality: Right;  .  SPINE SURGERY     fusion  . UPPER GASTROINTESTINAL ENDOSCOPY    . WISDOM TOOTH EXTRACTION     young adult    Family History  Problem Relation Age of Onset  . Lupus Mother   . Breast cancer Maternal Aunt   . Anesthesia problems Neg Hx   . Colon cancer Neg Hx   . Colon polyps Neg Hx   . Esophageal cancer Neg Hx   . Rectal cancer Neg Hx   . Stomach cancer Neg Hx     Allergies  Allergen Reactions  . Tamiflu [Oseltamivir Phosphate] Other (See Comments)    Seizure  . Ciprofloxacin Diarrhea and Nausea And Vomiting  . Metoclopramide Hcl Other (See Comments)    "lock jaw"  . Sulfa Antibiotics Nausea And Vomiting    Current Outpatient Prescriptions on File Prior to Visit  Medication Sig Dispense Refill  . atomoxetine (STRATTERA) 40 MG  capsule TAKE 1 CAPSULE (40 MG TOTAL) BY MOUTH 2 (TWO) TIMES DAILY WITH A MEAL. 180 capsule 0  . gabapentin (NEURONTIN) 100 MG capsule TAKE 1 CAPSULE BY MOUTH THREE TIMES A DAY 90 capsule 0  . levonorgestrel (MIRENA) 20 MCG/24HR IUD 1 each by Intrauterine route once.    Marland Kitchen omeprazole (PRILOSEC) 40 MG capsule Take 1 capsule (40 mg total) by mouth 2 (two) times daily. 60 capsule 11  . SOOLANTRA 1 % CREA Apply 1 application topically daily. Reported on 02/22/2016  3  . TURMERIC PO Take by mouth.    . valACYclovir (VALTREX) 1000 MG tablet Take 1 tablet (1,000 mg total) by mouth 3 (three) times daily as needed. For flare ups 90 tablet 2  . [DISCONTINUED] Norgestim-Eth Radene Journey Triphasic (ORTHO TRI-CYCLEN, 28, PO) Take 0.035 mg by mouth daily.      Current Facility-Administered Medications on File Prior to Visit  Medication Dose Route Frequency Provider Last Rate Last Dose  . 0.9 %  sodium chloride infusion  500 mL Intravenous Continuous Nandigam, Kavitha V, MD        BP 115/77   Pulse 66   Temp 97.6 F (36.4 C) (Oral)   Ht 5\' 3"  (1.6 m)   Wt 140 lb 12.8 oz (63.9 kg)   SpO2 98%   BMI 24.94 kg/m       Objective:   Physical Exam  General Appearance- Not in acute distress.  HEENT Eyes- Scleraeral/Conjuntiva-bilat- Not Yellow. Mouth & Throat- Normal.  Chest and Lung Exam Auscultation: Breath sounds:-Normal. Adventitious sounds:- No Adventitious sounds.  Cardiovascular Auscultation:Rythm - Regular. Heart Sounds -Normal heart sounds.  Abdomen Inspection:-Inspection Normal.  Palpation/Perucssion: Palpation and Percussion of the abdomen reveal- moderate epigasric Tender and lower quadrant tenderness, No Rebound tenderness, No rigidity(Guarding) and No Palpable abdominal masses.  Liver:-Normal.  Spleen:- Normal.   Back- no cva tenderness.      Assessment & Plan:  With your fatigue,  severe abdomen pain this am, near syncope, recent GI procedure, rectal bleeding and hx of colitis, I  do think best for you to be evaluated at the ED.   I have talked with ED MD and notified him of your recent signs and symptoms.  Follow up her post ED evaluation as they determine  Kadeen Sroka, Percell Miller, PA-C

## 2017-03-22 NOTE — ED Notes (Signed)
Pt at CT

## 2017-03-25 ENCOUNTER — Encounter: Payer: Self-pay | Admitting: Gastroenterology

## 2017-04-05 ENCOUNTER — Ambulatory Visit: Payer: BLUE CROSS/BLUE SHIELD | Admitting: Family Medicine

## 2017-05-20 ENCOUNTER — Encounter: Payer: Self-pay | Admitting: Family Medicine

## 2017-05-20 ENCOUNTER — Ambulatory Visit (INDEPENDENT_AMBULATORY_CARE_PROVIDER_SITE_OTHER): Payer: BLUE CROSS/BLUE SHIELD | Admitting: Family Medicine

## 2017-05-20 VITALS — BP 130/100 | HR 94 | Temp 98.8°F | Ht 63.0 in | Wt 139.0 lb

## 2017-05-20 DIAGNOSIS — R748 Abnormal levels of other serum enzymes: Secondary | ICD-10-CM

## 2017-05-20 DIAGNOSIS — M545 Low back pain: Secondary | ICD-10-CM | POA: Diagnosis not present

## 2017-05-20 DIAGNOSIS — K625 Hemorrhage of anus and rectum: Secondary | ICD-10-CM | POA: Insufficient documentation

## 2017-05-20 DIAGNOSIS — G8929 Other chronic pain: Secondary | ICD-10-CM | POA: Diagnosis not present

## 2017-05-20 DIAGNOSIS — R030 Elevated blood-pressure reading, without diagnosis of hypertension: Secondary | ICD-10-CM | POA: Insufficient documentation

## 2017-05-20 LAB — CBC WITH DIFFERENTIAL/PLATELET
BASOS ABS: 0 10*3/uL (ref 0.0–0.1)
Basophils Relative: 0.7 % (ref 0.0–3.0)
EOS ABS: 0.1 10*3/uL (ref 0.0–0.7)
Eosinophils Relative: 1.5 % (ref 0.0–5.0)
HEMATOCRIT: 38 % (ref 36.0–46.0)
Hemoglobin: 13.1 g/dL (ref 12.0–15.0)
LYMPHS PCT: 31.1 % (ref 12.0–46.0)
Lymphs Abs: 1 10*3/uL (ref 0.7–4.0)
MCHC: 34.4 g/dL (ref 30.0–36.0)
MCV: 93.8 fl (ref 78.0–100.0)
Monocytes Absolute: 0.5 10*3/uL (ref 0.1–1.0)
Monocytes Relative: 14.3 % — ABNORMAL HIGH (ref 3.0–12.0)
NEUTROS ABS: 1.8 10*3/uL (ref 1.4–7.7)
NEUTROS PCT: 52.4 % (ref 43.0–77.0)
Platelets: 194 10*3/uL (ref 150.0–400.0)
RBC: 4.06 Mil/uL (ref 3.87–5.11)
RDW: 13.3 % (ref 11.5–15.5)
WBC: 3.4 10*3/uL — AB (ref 4.0–10.5)

## 2017-05-20 LAB — COMPREHENSIVE METABOLIC PANEL
ALBUMIN: 5 g/dL (ref 3.5–5.2)
ALK PHOS: 34 U/L — AB (ref 39–117)
ALT: 198 U/L — ABNORMAL HIGH (ref 0–35)
AST: 174 U/L — ABNORMAL HIGH (ref 0–37)
BUN: 12 mg/dL (ref 6–23)
CO2: 29 mEq/L (ref 19–32)
Calcium: 9.4 mg/dL (ref 8.4–10.5)
Chloride: 97 mEq/L (ref 96–112)
Creatinine, Ser: 0.57 mg/dL (ref 0.40–1.20)
GFR: 124.21 mL/min (ref >60.00)
Glucose, Bld: 117 mg/dL — ABNORMAL HIGH (ref 70–99)
POTASSIUM: 3.9 meq/L (ref 3.5–5.1)
Sodium: 136 mEq/L (ref 135–145)
TOTAL PROTEIN: 8.2 g/dL (ref 6.0–8.3)
Total Bilirubin: 0.4 mg/dL (ref 0.2–1.2)

## 2017-05-20 MED ORDER — CYCLOBENZAPRINE HCL 10 MG PO TABS: 10.0000 mg | ORAL_TABLET | Freq: Three times a day (TID) | ORAL | 0 refills | Status: DC | PRN

## 2017-05-20 NOTE — Assessment & Plan Note (Signed)
Dash diet Pt was also in pain Recheck 2-3 weeks

## 2017-05-20 NOTE — Assessment & Plan Note (Signed)
No known injury

## 2017-05-20 NOTE — Assessment & Plan Note (Signed)
Occurred after egd-- pt was in er and ct was neg-- dx with colitis

## 2017-05-20 NOTE — Progress Notes (Signed)
Patient ID: [name], female    DOB: 1976/03/07  Age: 41 y.o. MRN: [id number]    Subjective:  Subjective  HPI [name] presents with c/o low back pain.  She has a hx of mult back surgeries   Review of Systems  Constitutional: Negative for appetite change, diaphoresis, fatigue and unexpected weight change.  Eyes: Negative for pain, redness and visual disturbance.  Respiratory: Negative for cough, chest tightness, shortness of breath and wheezing.   Cardiovascular: Negative for chest pain, palpitations and leg swelling.  Endocrine: Negative for cold intolerance, heat intolerance, polydipsia, polyphagia and polyuria.  Genitourinary: Negative for difficulty urinating, dysuria and frequency.  Musculoskeletal: Positive for back pain.  Neurological: Negative for dizziness, light-headedness, numbness and headaches.    History Past Medical History:  Diagnosis Date  . Allergy   . Anemia   . Anxiety    h/o panic attack, due to cysts in axilla area & requiring I&D, last episode, 2011,   . 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 Back pain   . 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 GERD (gastroesophageal reflux disease)   . Hypotension    per pt.   . Kidney infection    1998- hosp.- [facility]  . Numbness and tingling    right leg   . Recurrent upper respiratory infection (URI)    viral infection - URI- 10/2011, penicillin treatmernt   . Seizures ([place])    side effect of Tamiflu- 2014 last seizure per pt7-31-18    She has a past surgical history that includes Cesarean section; Colonoscopy; Wisdom tooth extraction; Anterior lumbar fusion (11/14/2011); Spine surgery; Lumbar laminectomy/decompression microdiscectomy (Right, 12/15/2015); and Upper gastrointestinal endoscopy.   Her family history includes Breast cancer in her maternal aunt; Lupus in her mother.She reports that she has been smoking E-cigarettes and Cigarettes.  She has a 3.75 pack-year smoking history. She has never used smokeless tobacco.  She reports that she drinks alcohol. She reports that she does not use drugs.  Current Outpatient Prescriptions on File Prior to Visit  Medication Sig Dispense Refill  . atomoxetine (STRATTERA) 40 MG capsule TAKE 1 CAPSULE (40 MG TOTAL) BY MOUTH 2 (TWO) TIMES DAILY WITH A MEAL. 180 capsule 0  . gabapentin (NEURONTIN) 100 MG capsule TAKE 1 CAPSULE BY MOUTH THREE TIMES A DAY 90 capsule 0  . levonorgestrel (MIRENA) 20 MCG/24HR IUD 1 each by Intrauterine route once.    Marland Kitchen omeprazole (PRILOSEC) 40 MG capsule Take 1 capsule (40 mg total) by mouth 2 (two) times daily. 60 capsule 11  . SOOLANTRA 1 % CREA Apply 1 application topically daily. Reported on 02/22/2016  3  . TURMERIC PO Take by mouth.    . valACYclovir (VALTREX) 1000 MG tablet Take 1 tablet (1,000 mg total) by mouth 3 (three) times daily as needed. For flare ups 90 tablet 2  . [DISCONTINUED] Norgestim-Eth Radene Journey Triphasic (ORTHO TRI-CYCLEN, 28, PO) Take 0.035 mg by mouth daily.      Current Facility-Administered Medications on File Prior to Visit  Medication Dose Route Frequency Provider Last Rate Last Dose  . 0.9 %  sodium chloride infusion  500 mL Intravenous Continuous Nandigam, Venia Minks, MD         Objective:  Objective  Physical Exam  Constitutional: She is oriented to person, place, and time. She appears well-developed and well-nourished.  HENT:  Head: Normocephalic and atraumatic.  Eyes: Conjunctivae and EOM are normal.  Neck: Normal range of motion. Neck supple.  No JVD present. Carotid bruit is not present. No thyromegaly present.  Cardiovascular: Normal rate, regular rhythm and normal heart sounds.   No murmur heard. Pulmonary/Chest: Effort normal and breath sounds normal. No respiratory distress. She has no wheezes. She has no rales. She exhibits no tenderness.  Musculoskeletal: She exhibits tenderness. She exhibits no edema.  Neurological: She is alert and oriented to person, place, and time. She has normal reflexes.    Psychiatric: She has a normal mood and affect.  Nursing note and vitals reviewed.  BP (!) 130/100   Pulse 94   Temp 98.8 F (37.1 C) (Oral)   Ht 5\' 3"  (1.6 m)   Wt 139 lb (63 kg)   SpO2 98%   BMI 24.62 kg/m  Wt Readings from Last 3 Encounters:  05/20/17 139 lb (63 kg)  03/22/17 140 lb (63.5 kg)  03/22/17 140 lb 12.8 oz (63.9 kg)     Lab Results  Component Value Date   WBC 3.4 (L) 05/20/2017   HGB 13.1 05/20/2017   HCT 38.0 05/20/2017   PLT 194.0 05/20/2017   GLUCOSE 117 (H) 05/20/2017   ALT 198 (H) 05/20/2017   AST 174 (H) 05/20/2017   NA 136 05/20/2017   K 3.9 05/20/2017   CL 97 05/20/2017   CREATININE 0.57 05/20/2017   BUN 12 05/20/2017   CO2 29 05/20/2017   INR 1.07 11/08/2011    Ct Abdomen Pelvis W Contrast  Result Date: 03/22/2017 CLINICAL DATA:  41 year old female with blood in stool today. EXAM: CT ABDOMEN AND PELVIS WITH CONTRAST TECHNIQUE: Multidetector CT imaging of the abdomen and pelvis was performed using the standard protocol following bolus administration of intravenous contrast. CONTRAST:  169mL ISOVUE-300 IOPAMIDOL (ISOVUE-300) INJECTION 61% COMPARISON:  None. FINDINGS: Lower chest: No acute abnormality. Hepatobiliary: No focal liver abnormality is seen. No gallstones, gallbladder wall thickening, or biliary dilatation. Pancreas: Unremarkable. No pancreatic ductal dilatation or surrounding inflammatory changes. Spleen: Normal in size without focal abnormality. Adrenals/Urinary Tract: Adrenal glands are unremarkable. Kidneys are normal, without renal calculi, focal lesion, or hydronephrosis. Bladder is unremarkable. Stomach/Bowel: Stomach is within normal limits. Appendix appears normal. No evidence of bowel wall thickening, distention, or inflammatory changes. Vascular/Lymphatic: No significant vascular findings are present. No enlarged abdominal or pelvic lymph nodes. Reproductive: Uterus and bilateral adnexa are unremarkable. Note is made of an intrauterine  device. Other: No abdominal wall hernia or abnormality. No abdominopelvic ascites. Musculoskeletal: No acute or significant osseous findings. There are discogenic degenerative changes of the lumbar spine with anterior fusion at L5-S1. IMPRESSION: No acute intra-abdominal process to explain the patient's clinical symptoms. Electronically Signed   By: Kristopher Oppenheim M.D.   On: 03/22/2017 16:10     Assessment & Plan:  Plan  I have discontinued Ms. Galasso's amoxicillin-clavulanate, metroNIDAZOLE, ondansetron, and dicyclomine. I am also having her start on cyclobenzaprine. Additionally, I am having her maintain her levonorgestrel, valACYclovir, SOOLANTRA, TURMERIC PO, atomoxetine, gabapentin, and omeprazole. We will continue to administer sodium chloride.  Meds ordered this encounter  Medications  . cyclobenzaprine (FLEXERIL) 10 MG tablet    Sig: Take 1 tablet (10 mg total) by mouth 3 (three) times daily as needed for muscle spasms.    Dispense:  30 tablet    Refill:  0    Problem List Items Addressed This Visit      Unprioritized   Elevated BP without diagnosis of hypertension    Dash diet Pt was also in pain Recheck 2-3 weeks  LOW BACK PAIN, CHRONIC - Primary    No known injury       Relevant Medications   cyclobenzaprine (FLEXERIL) 10 MG tablet   Rectal bleed    Occurred after egd-- pt was in er and ct was neg-- dx with colitis        Relevant Orders   Fecal occult blood, imunochemical   Ambulatory referral to Gastroenterology   CBC with Differential/Platelet (Completed)   Comprehensive metabolic panel (Completed)    Other Visit Diagnoses    Elevated liver enzymes       Relevant Orders   CBC with Differential/Platelet (Completed)   Comprehensive metabolic panel (Completed)      Follow-up: Return if symptoms worsen or fail to improve.  Ann Held, DO

## 2017-05-20 NOTE — Patient Instructions (Signed)

## 2017-05-22 ENCOUNTER — Other Ambulatory Visit: Payer: Self-pay | Admitting: Family Medicine

## 2017-05-22 DIAGNOSIS — R748 Abnormal levels of other serum enzymes: Secondary | ICD-10-CM

## 2017-05-25 ENCOUNTER — Other Ambulatory Visit: Payer: Self-pay | Admitting: Family Medicine

## 2017-05-27 ENCOUNTER — Ambulatory Visit (HOSPITAL_BASED_OUTPATIENT_CLINIC_OR_DEPARTMENT_OTHER)
Admission: RE | Admit: 2017-05-27 | Discharge: 2017-05-27 | Disposition: A | Discharge status: Home / Self Care | Payer: BLUE CROSS/BLUE SHIELD | Source: Ambulatory Visit | Attending: Family Medicine | Admitting: Family Medicine

## 2017-05-27 DIAGNOSIS — R748 Abnormal levels of other serum enzymes: Secondary | ICD-10-CM | POA: Diagnosis not present

## 2017-05-27 DIAGNOSIS — K760 Fatty (change of) liver, not elsewhere classified: Secondary | ICD-10-CM | POA: Insufficient documentation

## 2017-05-29 NOTE — Telephone Encounter (Signed)
Pt called states [facility] sent refill request and we have not responded. STRATTERA 40 MG. Pt states has one dose left for tomorrow. Sent message for refill to [facility] and [employer] assistant. Pt states do not call her mobile it will not get good reception at her job. Please call if needed pt work ph [phone].

## 2017-05-29 NOTE — Telephone Encounter (Signed)
Please send with 3 refills

## 2017-05-29 NOTE — Telephone Encounter (Signed)
Sent in prescription Called the patient left message on work number refill done/spoke to the patient on her cell number let her know prescription has been sent in.

## 2017-06-06 ENCOUNTER — Ambulatory Visit (INDEPENDENT_AMBULATORY_CARE_PROVIDER_SITE_OTHER): Payer: BLUE CROSS/BLUE SHIELD | Admitting: Family Medicine

## 2017-06-06 VITALS — BP 125/83 | HR 80

## 2017-06-06 DIAGNOSIS — R030 Elevated blood-pressure reading, without diagnosis of hypertension: Secondary | ICD-10-CM | POA: Diagnosis not present

## 2017-06-06 NOTE — Patient Instructions (Signed)
Per Dr. Carollee Herter: Return in 6 months for a blood pressure follow-up with PCP.

## 2017-06-06 NOTE — Progress Notes (Signed)
Pre visit review using our clinic review tool, if applicable. No additional management support is needed unless otherwise documented below in the visit note.  Patient presents in office for blood pressure check per OV note 05/20/17. Currently, the patient does not take any medication for elevated blood pressure. Patient is asymptomatic. Today's readings were as follow: BP 125/83 P 80 & 02 100%.  Per Dr. Carollee Herter:  Return in 6 months for a blood pressure follow-up with PCP.  Informed patient of the provider's recommendations. She verbalized understanding & did not have any further questions or concerns prior to leaving the nurse visit.  Reviewed    Ann Held, DO

## 2017-07-17 ENCOUNTER — Encounter: Payer: Self-pay | Admitting: Family Medicine

## 2017-07-17 ENCOUNTER — Ambulatory Visit: Payer: BLUE CROSS/BLUE SHIELD | Admitting: Gastroenterology

## 2017-08-18 ENCOUNTER — Other Ambulatory Visit: Payer: Self-pay | Admitting: Medical

## 2017-08-29 ENCOUNTER — Ambulatory Visit: Payer: BLUE CROSS/BLUE SHIELD | Admitting: Family Medicine

## 2017-09-02 ENCOUNTER — Encounter: Payer: Self-pay | Admitting: Family Medicine

## 2017-09-02 ENCOUNTER — Ambulatory Visit (INDEPENDENT_AMBULATORY_CARE_PROVIDER_SITE_OTHER): Payer: Self-pay | Admitting: Family Medicine

## 2017-09-02 ENCOUNTER — Ambulatory Visit: Payer: BLUE CROSS/BLUE SHIELD | Admitting: Family Medicine

## 2017-09-02 VITALS — BP 136/86 | HR 83 | Temp 98.8°F | Ht 63.0 in | Wt 144.0 lb

## 2017-09-02 DIAGNOSIS — H04123 Dry eye syndrome of bilateral lacrimal glands: Secondary | ICD-10-CM | POA: Insufficient documentation

## 2017-09-02 DIAGNOSIS — H04129 Dry eye syndrome of unspecified lacrimal gland: Secondary | ICD-10-CM

## 2017-09-02 DIAGNOSIS — R739 Hyperglycemia, unspecified: Secondary | ICD-10-CM

## 2017-09-02 DIAGNOSIS — F988 Other specified behavioral and emotional disorders with onset usually occurring in childhood and adolescence: Secondary | ICD-10-CM

## 2017-09-02 LAB — BASIC METABOLIC PANEL
BUN: 11 mg/dL (ref 6–23)
CHLORIDE: 99 meq/L (ref 96–112)
CO2: 27 meq/L (ref 19–32)
Calcium: 9.2 mg/dL (ref 8.4–10.5)
Creatinine, Ser: 0.62 mg/dL (ref 0.40–1.20)
GFR: 112.56 mL/min (ref >60.00)
Glucose, Bld: 116 mg/dL — ABNORMAL HIGH (ref 70–99)
Potassium: 3.8 mEq/L (ref 3.5–5.1)
SODIUM: 137 meq/L (ref 135–145)

## 2017-09-02 LAB — HEMOGLOBIN A1C: HEMOGLOBIN A1C: 5.8 % (ref 4.6–6.5)

## 2017-09-02 MED ORDER — ATOMOXETINE HCL 80 MG PO CAPS: 80.0000 mg | ORAL_CAPSULE | Freq: Every day | ORAL | 3 refills | Status: DC

## 2017-09-02 NOTE — Patient Instructions (Addendum)
DASH Eating Plan DASH stands for "Dietary Approaches to Stop Hypertension." The DASH eating plan is a healthy eating plan that has been shown to reduce high blood pressure (hypertension). It may also reduce your risk for type 2 diabetes, heart disease, and stroke. The DASH eating plan may also help with weight loss. What are tips for following this plan? General guidelines  Avoid eating more than 2,300 mg (milligrams) of salt (sodium) a day. If you have hypertension, you may need to reduce your sodium intake to 1,500 mg a day.  Limit alcohol intake to no more than 1 drink a day for nonpregnant women and 2 drinks a day for men. One drink equals 12 oz of beer, 5 oz of wine, or 1 oz of hard liquor.  Work with your health care provider to maintain a healthy body weight or to lose weight. Ask what an ideal weight is for you.  Get at least 30 minutes of exercise that causes your heart to beat faster (aerobic exercise) most days of the week. Activities may include walking, swimming, or biking.  Work with your health care provider or diet and nutrition specialist (dietitian) to adjust your eating plan to your individual calorie needs. Reading food labels  Check food labels for the amount of sodium per serving. Choose foods with less than 5 percent of the Daily Value of sodium. Generally, foods with less than 300 mg of sodium per serving fit into this eating plan.  To find whole grains, look for the word "whole" as the first word in the ingredient list. Shopping  Buy products labeled as "low-sodium" or "no salt added."  Buy fresh foods. Avoid canned foods and premade or frozen meals. Cooking  Avoid adding salt when cooking. Use salt-free seasonings or herbs instead of table salt or sea salt. Check with your health care provider or pharmacist before using salt substitutes.  Do not fry foods. Cook foods using healthy methods such as baking, boiling, grilling, and broiling instead.  Cook with  heart-healthy oils, such as olive, canola, soybean, or sunflower oil. Meal planning   Eat a balanced diet that includes: ? 5 or more servings of fruits and vegetables each day. At each meal, try to fill half of your plate with fruits and vegetables. ? Up to 6-8 servings of whole grains each day. ? Less than 6 oz of lean meat, poultry, or fish each day. A 3-oz serving of meat is about the same size as a deck of cards. One egg equals 1 oz. ? 2 servings of low-fat dairy each day. ? A serving of nuts, seeds, or beans 5 times each week. ? Heart-healthy fats. Healthy fats called Omega-3 fatty acids are found in foods such as flaxseeds and coldwater fish, like sardines, salmon, and mackerel.  Limit how much you eat of the following: ? Canned or prepackaged foods. ? Food that is high in trans fat, such as fried foods. ? Food that is high in saturated fat, such as fatty meat. ? Sweets, desserts, sugary drinks, and other foods with added sugar. ? Full-fat dairy products.  Do not salt foods before eating.  Try to eat at least 2 vegetarian meals each week.  Eat more home-cooked food and less restaurant, buffet, and fast food.  When eating at a restaurant, ask that your food be prepared with less salt or no salt, if possible. What foods are recommended? The items listed may not be a complete list. Talk with your dietitian about what   dietary choices are best for you. Grains Whole-grain or whole-wheat bread. Whole-grain or whole-wheat pasta. Brown rice. Oatmeal. Quinoa. Bulgur. Whole-grain and low-sodium cereals. Pita bread. Low-fat, low-sodium crackers. Whole-wheat flour tortillas. Vegetables Fresh or frozen vegetables (raw, steamed, roasted, or grilled). Low-sodium or reduced-sodium tomato and vegetable juice. Low-sodium or reduced-sodium tomato sauce and tomato paste. Low-sodium or reduced-sodium canned vegetables. Fruits All fresh, dried, or frozen fruit. Canned fruit in natural juice (without  added sugar). Meat and other protein foods Skinless chicken or turkey. Ground chicken or turkey. Pork with fat trimmed off. Fish and seafood. Egg whites. Dried beans, peas, or lentils. Unsalted nuts, nut butters, and seeds. Unsalted canned beans. Lean cuts of beef with fat trimmed off. Low-sodium, lean deli meat. Dairy Low-fat (1%) or fat-free (skim) milk. Fat-free, low-fat, or reduced-fat cheeses. Nonfat, low-sodium ricotta or cottage cheese. Low-fat or nonfat yogurt. Low-fat, low-sodium cheese. Fats and oils Soft margarine without trans fats. Vegetable oil. Low-fat, reduced-fat, or light mayonnaise and salad dressings (reduced-sodium). Canola, safflower, olive, soybean, and sunflower oils. Avocado. Seasoning and other foods Herbs. Spices. Seasoning mixes without salt. Unsalted popcorn and pretzels. Fat-free sweets. What foods are not recommended? The items listed may not be a complete list. Talk with your dietitian about what dietary choices are best for you. Grains Baked goods made with fat, such as croissants, muffins, or some breads. Dry pasta or rice meal packs. Vegetables Creamed or fried vegetables. Vegetables in a cheese sauce. Regular canned vegetables (not low-sodium or reduced-sodium). Regular canned tomato sauce and paste (not low-sodium or reduced-sodium). Regular tomato and vegetable juice (not low-sodium or reduced-sodium). Pickles. Olives. Fruits Canned fruit in a light or heavy syrup. Fried fruit. Fruit in cream or butter sauce. Meat and other protein foods Fatty cuts of meat. Ribs. Fried meat. Bacon. Sausage. Bologna and other processed lunch meats. Salami. Fatback. Hotdogs. Bratwurst. Salted nuts and seeds. Canned beans with added salt. Canned or smoked fish. Whole eggs or egg yolks. Chicken or turkey with skin. Dairy Whole or 2% milk, cream, and half-and-half. Whole or full-fat cream cheese. Whole-fat or sweetened yogurt. Full-fat cheese. Nondairy creamers. Whipped toppings.  Processed cheese and cheese spreads. Fats and oils Butter. Stick margarine. Lard. Shortening. Ghee. Bacon fat. Tropical oils, such as coconut, palm kernel, or palm oil. Seasoning and other foods Salted popcorn and pretzels. Onion salt, garlic salt, seasoned salt, table salt, and sea salt. Worcestershire sauce. Tartar sauce. Barbecue sauce. Teriyaki sauce. Soy sauce, including reduced-sodium. Steak sauce. Canned and packaged gravies. Fish sauce. Oyster sauce. Cocktail sauce. Horseradish that you find on the shelf. Ketchup. Mustard. Meat flavorings and tenderizers. Bouillon cubes. Hot sauce and Tabasco sauce. Premade or packaged marinades. Premade or packaged taco seasonings. Relishes. Regular salad dressings. Where to find more information:  National Heart, Lung, and Blood Institute: www.nhlbi.nih.gov  American Heart Association: www.heart.org Summary  The DASH eating plan is a healthy eating plan that has been shown to reduce high blood pressure (hypertension). It may also reduce your risk for type 2 diabetes, heart disease, and stroke.  With the DASH eating plan, you should limit salt (sodium) intake to 2,300 mg a day. If you have hypertension, you may need to reduce your sodium intake to 1,500 mg a day.  When on the DASH eating plan, aim to eat more fresh fruits and vegetables, whole grains, lean proteins, low-fat dairy, and heart-healthy fats.  Work with your health care provider or diet and nutrition specialist (dietitian) to adjust your eating plan to your individual   calorie needs. This information is not intended to replace advice given to you by your health care provider. Make sure you discuss any questions you have with your health care provider. Document Released: 07/12/2011 Document Revised: 07/16/2016 Document Reviewed: 07/16/2016 Elsevier Interactive Patient Education  2018 [employer].    Hyperglycemia  Hyperglycemia occurs when the level of sugar (glucose) in the blood is  too high. Glucose is a type of sugar that provides the body's main source of energy. Certain hormones (insulin and glucagon) control the level of glucose in the blood. Insulin lowers blood glucose, and glucagon increases blood glucose. Hyperglycemia can result from having too little insulin in the bloodstream, or from the body not responding normally to insulin. Hyperglycemia occurs most often in people who have diabetes (diabetes mellitus), but it can happen in people who do not have diabetes. It can develop quickly, and it can be life-threatening if it causes you to become severely dehydrated (diabetic ketoacidosis or hyperglycemic hyperosmolar state). Severe hyperglycemia is a medical emergency. What are the causes? If you have diabetes, hyperglycemia may be caused by:  Diabetes medicine.  Medicines that increase blood glucose or affect your diabetes control.  Not eating enough, or not eating often enough.  Changes in physical activity level.  Being sick or having an infection.  If you have prediabetes or undiagnosed diabetes:  Hyperglycemia may be caused by those conditions.  If you do not have diabetes, hyperglycemia may be caused by:  Certain medicines, including steroid medicines, beta-blockers, epinephrine, and thiazide diuretics.  Stress.  Serious illness.  Surgery.  Diseases of the pancreas.  Infection.  What increases the risk? Hyperglycemia is more likely to develop in people who have risk factors for diabetes, such as:  Having a family member with diabetes.  Having a gene for type 1 diabetes that is passed from parent to child (inherited).  Living in an area with cold weather conditions.  Exposure to certain viruses.  Certain conditions in which the body's disease-fighting (immune) system attacks itself (autoimmune disorders).  Being overweight or obese.  Having an inactive (sedentary) lifestyle.  Having been diagnosed with insulin resistance.  Having  a history of prediabetes, gestational diabetes, or polycystic ovarian syndrome (PCOS).  Being of American-Indian, African-American, Hispanic/Latino, or Asian/Pacific Islander descent.  What are the signs or symptoms? Hyperglycemia may not cause any symptoms. If you do have symptoms, they may include early warning signs, such as:  Increased thirst.  Hunger.  Feeling very tired.  Needing to urinate more often than usual.  Blurry vision.  Other symptoms may develop if hyperglycemia gets worse, such as:  Dry mouth.  Loss of appetite.  Fruity-smelling breath.  Weakness.  Unexpected or rapid weight gain or weight loss.  Tingling or numbness in the hands or feet.  Headache.  Skin that does not quickly return to normal after being lightly pinched and released (poor skin turgor).  Abdominal pain.  Cuts or bruises that are slow to heal.  How is this diagnosed? Hyperglycemia is diagnosed with a blood test to measure your blood glucose level. This blood test is usually done while you are having symptoms. Your health care provider may also do a physical exam and review your medical history. You may have more tests to determine the cause of your hyperglycemia, such as:  A fasting blood glucose (FBG) test. You will not be allowed to eat (you will fast) for at least 8 hours before a blood sample is taken.  An A1c (hemoglobin  A1c) blood test. This provides information about blood glucose control over the previous 2-3 months.  An oral glucose tolerance test (OGTT). This measures your blood glucose at two times: ? After fasting. This is your baseline blood glucose level. ? Two hours after drinking a beverage that contains glucose.  How is this treated? Treatment depends on the cause of your hyperglycemia. Treatment may include:  Taking medicine to regulate your blood glucose levels. If you take insulin or other diabetes medicines, your medicine or dosage may be  adjusted.  Lifestyle changes, such as exercising more, eating healthier foods, or losing weight.  Treating an illness or infection, if this caused your hyperglycemia.  Checking your blood glucose more often.  Stopping or reducing steroid medicines, if these caused your hyperglycemia.  If your hyperglycemia becomes severe and it results in hyperglycemic hyperosmolar state, you must be hospitalized and given IV fluids. Follow these instructions at home: General instructions  Take over-the-counter and prescription medicines only as told by your health care provider.  Do not use any products that contain nicotine or tobacco, such as cigarettes and e-cigarettes. If you need help quitting, ask your health care provider.  Limit alcohol intake to no more than 1 drink per day for nonpregnant women and 2 drinks per day for men. One drink equals 12 oz of beer, 5 oz of wine, or 1 oz of hard liquor.  Learn to manage stress. If you need help with this, ask your health care provider.  Keep all follow-up visits as told by your health care provider. This is important. Eating and drinking  Maintain a healthy weight.  Exercise regularly, as directed by your health care provider.  Stay hydrated, especially when you exercise, get sick, or spend time in hot temperatures.  Eat healthy foods, such as: ? Lean proteins. ? Complex carbohydrates. ? Fresh fruits and vegetables. ? Low-fat dairy products. ? Healthy fats.  Drink enough fluid to keep your urine clear or pale yellow. If you have diabetes:   Make sure you know the symptoms of hyperglycemia.  Follow your diabetes management plan, as told by your health care provider. Make sure you: ? Take your insulin and medicines as directed. ? Follow your exercise plan. ? Follow your meal plan. Eat on time, and do not skip meals. ? Check your blood glucose as often as directed. Make sure to check your blood glucose before and after exercise. If you  exercise longer or in a different way than usual, check your blood glucose more often. ? Follow your sick day plan whenever you cannot eat or drink normally. Make this plan in advance with your health care provider.  Share your diabetes management plan with people in your workplace, school, and household.  Check your urine for ketones when you are ill and as told by your health care provider.  Carry a medical alert card or wear medical alert jewelry. Contact a health care provider if:  Your blood glucose is at or above 240 mg/dL (13.3 mmol/L) for 2 days in a row.  You have problems keeping your blood glucose in your target range.  You have frequent episodes of hyperglycemia. Get help right away if:  You have difficulty breathing.  You have a change in how you think, feel, or act (mental status).  You have nausea or vomiting that does not go away. These symptoms may represent a serious problem that is an emergency. Do not wait to see if the symptoms will go away.  Get medical help right away. Call your local emergency services (911 in the [place] Do not drive yourself to the hospital. Summary  Hyperglycemia occurs when the level of sugar (glucose) in the blood is too high.  Hyperglycemia is diagnosed with a blood test to measure your blood glucose level. This blood test is usually done while you are having symptoms. Your health care provider may also do a physical exam and review your medical history.  If you have diabetes, follow your diabetes management plan as told by your health care provider.  Contact your health care provider if you have problems keeping your blood glucose in your target range. This information is not intended to replace advice given to you by your health care provider. Make sure you discuss any questions you have with your health care provider. Document Released: 01/16/2001 Document Revised: 04/09/2016 Document Reviewed: 04/09/2016 Elsevier Interactive Patient  Education  [employer].

## 2017-09-02 NOTE — Assessment & Plan Note (Signed)
con't straterra 80 mg qd

## 2017-09-02 NOTE — Progress Notes (Signed)
Subjective:  I acted as a Education administrator for [employer], [place]   Patient ID: [name], female    DOB: 23-Mar-1976, 42 y.o.   MRN: [id number]  Chief Complaint  Patient presents with  . Blood Sugar Problem    Running lower than usual.   . Hypertension  . ADD    Pt states that current Rx cost is too high and needs something else.     HPI  Patient is in today for BP check and glucose check.   Her glucose has been high.  She also c/o dry eye and allergies but can not take antihistamine due to fatigue.  No ha , no chest pain.  No sob or palpitations.    Patient Care Team: Carollee Herter, Alferd Apa, DO as PCP - General Susa Day, MD (Orthopedic Surgery)   Past Medical History:  Diagnosis Date  . Allergy   . Anemia   . Anxiety    h/o panic attack, due to cysts in axilla area & requiring I&D, last episode, 2011,   . 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 Back pain   . 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 GERD (gastroesophageal reflux disease)   . Hypotension    per pt.   . Kidney infection    1998- hosp.- [facility]  . Numbness and tingling    right leg   . Recurrent upper respiratory infection (URI)    viral infection - URI- 10/2011, penicillin treatmernt   . Seizures ([place])    side effect of Tamiflu- 2014 last seizure per pt7-31-18    Past Surgical History:  Procedure Laterality Date  . ANTERIOR LUMBAR FUSION  11/14/2011   Procedure: ANTERIOR LUMBAR FUSION 2 LEVELS;  Surgeon: Johnn Hai, MD;  Location: [place];  Service: Orthopedics;  Laterality: N/A;  ALIF L5-S1  . CESAREAN SECTION     2007- /w epidural  anesthesia   . COLONOSCOPY    . LUMBAR LAMINECTOMY/DECOMPRESSION MICRODISCECTOMY Right 12/15/2015   Procedure:  MICO LUMBER DECOMPRESSION L4-5 ON THE RIGHT, ;  Surgeon: Susa Day, MD;  Location: [facility];  Service: Orthopedics;  Laterality: Right;  . SPINE SURGERY     fusion  . UPPER GASTROINTESTINAL ENDOSCOPY    . WISDOM TOOTH EXTRACTION     young adult    Family History    Problem Relation Age of Onset  . Lupus Mother   . Breast cancer Maternal Aunt   . Anesthesia problems Neg Hx   . Colon cancer Neg Hx   . Colon polyps Neg Hx   . Esophageal cancer Neg Hx   . Rectal cancer Neg Hx   . Stomach cancer Neg Hx     Social History   Socioeconomic History  . Marital status: Married    Spouse name: Not on file  . Number of children: Not on file  . Years of education: Not on file  . Highest education level: Not on file  Social Needs  . Financial resource strain: Not on file  . Food insecurity - worry: Not on file  . Food insecurity - inability: Not on file  . Transportation needs - medical: Not on file  . Transportation needs - non-medical: Not on file  Occupational History  . Not on file  Tobacco Use  . Smoking status: Current Every Day Smoker    Packs/day: 0.25    Years: 15.00    Pack years: 3.75    Types: E-cigarettes, Cigarettes  .  Smokeless tobacco: Never Used  . Tobacco comment: 1 cig a day- using e cig now mostly  Substance and Sexual Activity  . Alcohol use: Yes    Alcohol/week: 0.0 oz    Comment: a glass of wine  on occas.   . Drug use: No  . Sexual activity: Not on file  Other Topics Concern  . Not on file  Social History Narrative  . Not on file    Outpatient Medications Prior to Visit  Medication Sig Dispense Refill  . cyclobenzaprine (FLEXERIL) 10 MG tablet Take 1 tablet (10 mg total) by mouth 3 (three) times daily as needed for muscle spasms. 30 tablet 0  . gabapentin (NEURONTIN) 100 MG capsule TAKE 1 CAPSULE BY MOUTH THREE TIMES A DAY 90 capsule 0  . levonorgestrel (MIRENA) 20 MCG/24HR IUD 1 each by Intrauterine route once.    Marland Kitchen omeprazole (PRILOSEC) 40 MG capsule Take 1 capsule (40 mg total) by mouth 2 (two) times daily. 60 capsule 11  . SOOLANTRA 1 % CREA Apply 1 application topically daily. Reported on 02/22/2016  3  . TURMERIC PO Take by mouth.    . valACYclovir (VALTREX) 1000 MG tablet Take 1 tablet (1,000 mg total) by  mouth 3 (three) times daily as needed. For flare ups 90 tablet 2  . atomoxetine (STRATTERA) 40 MG capsule TAKE 1 CAPSULE BY MOUTH TWICE A DAY WITH A MEAL 180 capsule 2   Facility-Administered Medications Prior to Visit  Medication Dose Route Frequency Provider Last Rate Last Dose  . 0.9 %  sodium chloride infusion  500 mL Intravenous Continuous Nandigam, Venia Minks, MD        Allergies  Allergen Reactions  . Tamiflu [Oseltamivir Phosphate] Other (See Comments)    Seizure  . Ciprofloxacin Diarrhea and Nausea And Vomiting  . Metoclopramide Hcl Other (See Comments)    "lock jaw"  . Sulfa Antibiotics Nausea And Vomiting    Review of Systems  Constitutional: Negative for chills, fever and malaise/fatigue.  HENT: Negative for congestion and hearing loss.   Eyes: Negative for discharge.       Eyes watering and burning  Respiratory: Negative for cough, sputum production and shortness of breath.   Cardiovascular: Negative for chest pain, palpitations and leg swelling.  Gastrointestinal: Negative for abdominal pain, blood in stool, constipation, diarrhea, heartburn, nausea and vomiting.  Genitourinary: Negative for dysuria, frequency, hematuria and urgency.  Musculoskeletal: Negative for back pain, falls and myalgias.  Skin: Negative for rash.  Neurological: Negative for dizziness, sensory change, loss of consciousness, weakness and headaches.  Endo/Heme/Allergies: Negative for environmental allergies. Does not bruise/bleed easily.  Psychiatric/Behavioral: Negative for depression and suicidal ideas. The patient is not nervous/anxious and does not have insomnia.        Objective:    Physical Exam  Constitutional: She is oriented to person, place, and time. She appears well-developed and well-nourished.  HENT:  Head: Normocephalic and atraumatic.  Eyes: Conjunctivae and EOM are normal.  Neck: Normal range of motion. Neck supple. No JVD present. Carotid bruit is not present. No  thyromegaly present.  Cardiovascular: Normal rate, regular rhythm and normal heart sounds.  No murmur heard. Pulmonary/Chest: Effort normal and breath sounds normal. No respiratory distress. She has no wheezes. She has no rales. She exhibits no tenderness.  Musculoskeletal: She exhibits no edema.  Neurological: She is alert and oriented to person, place, and time.  Psychiatric: She has a normal mood and affect.  Nursing note and vitals reviewed.  BP 136/86   Pulse 83   Temp 98.8 F (37.1 C) (Oral)   Ht 5\' 3"  (1.6 m)   Wt 144 lb (65.3 kg)   SpO2 97%   BMI 25.51 kg/m  Wt Readings from Last 3 Encounters:  09/02/17 144 lb (65.3 kg)  05/20/17 139 lb (63 kg)  03/22/17 140 lb (63.5 kg)   BP Readings from Last 3 Encounters:  09/02/17 136/86  06/06/17 125/83  05/20/17 (!) 130/100     Immunization History  Administered Date(s) Administered  . Influenza Whole 06/03/2008  . Tdap 06/10/2014    Health Maintenance  Topic Date Due  . INFLUENZA VACCINE  10/15/2017 (Originally 03/06/2017)  . PAP SMEAR  09/13/2017  . TETANUS/TDAP  06/10/2024  . HIV Screening  Completed    Lab Results  Component Value Date   WBC 3.4 (L) 05/20/2017   HGB 13.1 05/20/2017   HCT 38.0 05/20/2017   PLT 194.0 05/20/2017   GLUCOSE 117 (H) 05/20/2017   ALT 198 (H) 05/20/2017   AST 174 (H) 05/20/2017   NA 136 05/20/2017   K 3.9 05/20/2017   CL 97 05/20/2017   CREATININE 0.57 05/20/2017   BUN 12 05/20/2017   CO2 29 05/20/2017   INR 1.07 11/08/2011    No results found for: TSH Lab Results  Component Value Date   WBC 3.4 (L) 05/20/2017   HGB 13.1 05/20/2017   HCT 38.0 05/20/2017   MCV 93.8 05/20/2017   PLT 194.0 05/20/2017   Lab Results  Component Value Date   NA 136 05/20/2017   K 3.9 05/20/2017   CO2 29 05/20/2017   GLUCOSE 117 (H) 05/20/2017   BUN 12 05/20/2017   CREATININE 0.57 05/20/2017   BILITOT 0.4 05/20/2017   ALKPHOS 34 (L) 05/20/2017   AST 174 (H) 05/20/2017   ALT 198 (H)  05/20/2017   PROT 8.2 05/20/2017   ALBUMIN 5.0 05/20/2017   CALCIUM 9.4 05/20/2017   ANIONGAP 13 03/22/2017   GFR 124.21 05/20/2017   No results found for: CHOL No results found for: HDL No results found for: LDLCALC No results found for: TRIG No results found for: CHOLHDL No results found for: HGBA1C       Assessment & Plan:   Problem List Items Addressed This Visit      Unprioritized   ADD (attention deficit disorder) without hyperactivity    con't straterra 80 mg qd       Dry eye    And allergies Pt to use systane complete And otc allergy eye drops F/u opth if no relief       Other Visit Diagnoses    Attention deficit disorder (ADD) without hyperactivity    -  Primary   Relevant Medications   atomoxetine (STRATTERA) 80 MG capsule   Hyperglycemia       Relevant Orders   Hemoglobin U2G   Basic metabolic panel      I have discontinued [name] D. Kinder's atomoxetine. I am also having her start on atomoxetine. Additionally, I am having her maintain her levonorgestrel, valACYclovir, SOOLANTRA, TURMERIC PO, omeprazole, cyclobenzaprine, and gabapentin. We will continue to administer sodium chloride.  Meds ordered this encounter  Medications  . atomoxetine (STRATTERA) 80 MG capsule    Sig: Take 1 capsule (80 mg total) by mouth daily.    Dispense:  90 capsule    Refill:  3    CMA served as scribe during this visit. History, Physical and Plan performed by medical provider. Documentation  and orders reviewed and attested to.  Ann Held, DO

## 2017-09-02 NOTE — Assessment & Plan Note (Signed)
And allergies Pt to use systane complete And otc allergy eye drops F/u opth if no relief

## 2017-09-28 ENCOUNTER — Other Ambulatory Visit: Payer: Self-pay

## 2017-09-28 ENCOUNTER — Emergency Department (HOSPITAL_COMMUNITY): Payer: BLUE CROSS/BLUE SHIELD

## 2017-09-28 ENCOUNTER — Emergency Department (HOSPITAL_COMMUNITY)
Admission: EM | Admit: 2017-09-28 | Discharge: 2017-09-28 | Disposition: A | Discharge status: Home / Self Care | Payer: BLUE CROSS/BLUE SHIELD | Attending: Emergency Medicine | Admitting: Emergency Medicine

## 2017-09-28 ENCOUNTER — Encounter (HOSPITAL_COMMUNITY): Payer: Self-pay | Admitting: Emergency Medicine

## 2017-09-28 DIAGNOSIS — Y9301 Activity, walking, marching and hiking: Secondary | ICD-10-CM | POA: Insufficient documentation

## 2017-09-28 DIAGNOSIS — S82892A Other fracture of left lower leg, initial encounter for closed fracture: Secondary | ICD-10-CM | POA: Insufficient documentation

## 2017-09-28 DIAGNOSIS — F17210 Nicotine dependence, cigarettes, uncomplicated: Secondary | ICD-10-CM | POA: Diagnosis not present

## 2017-09-28 DIAGNOSIS — Z79899 Other long term (current) drug therapy: Secondary | ICD-10-CM | POA: Insufficient documentation

## 2017-09-28 DIAGNOSIS — Y929 Unspecified place or not applicable: Secondary | ICD-10-CM | POA: Diagnosis not present

## 2017-09-28 DIAGNOSIS — S99912A Unspecified injury of left ankle, initial encounter: Secondary | ICD-10-CM | POA: Diagnosis not present

## 2017-09-28 DIAGNOSIS — W0110XA Fall on same level from slipping, tripping and stumbling with subsequent striking against unspecified object, initial encounter: Secondary | ICD-10-CM | POA: Insufficient documentation

## 2017-09-28 DIAGNOSIS — Y999 Unspecified external cause status: Secondary | ICD-10-CM | POA: Diagnosis not present

## 2017-09-28 DIAGNOSIS — S8262XA Displaced fracture of lateral malleolus of left fibula, initial encounter for closed fracture: Secondary | ICD-10-CM | POA: Diagnosis not present

## 2017-09-28 DIAGNOSIS — S82432A Displaced oblique fracture of shaft of left fibula, initial encounter for closed fracture: Secondary | ICD-10-CM | POA: Diagnosis not present

## 2017-09-28 MED ORDER — OXYCODONE-ACETAMINOPHEN 5-325 MG PO TABS
1.0000 | ORAL_TABLET | Freq: Once | ORAL | Status: AC
  Administered 2017-09-28: 1 via ORAL
  Filled 2017-09-28: qty 1

## 2017-09-28 MED ORDER — ONDANSETRON 4 MG PO TBDP
4.0000 mg | ORAL_TABLET | Freq: Once | ORAL | Status: AC
  Administered 2017-09-28: 4 mg via ORAL
  Filled 2017-09-28: qty 1

## 2017-09-28 MED ORDER — ONDANSETRON 4 MG PO TBDP: 4.0000 mg | ORAL_TABLET | Freq: Four times a day (QID) | ORAL | 0 refills | Status: DC | PRN

## 2017-09-28 MED ORDER — OXYCODONE-ACETAMINOPHEN 5-325 MG PO TABS: 2.0000 | ORAL_TABLET | Freq: Four times a day (QID) | ORAL | 0 refills | Status: DC | PRN

## 2017-09-28 MED ORDER — OXYCODONE-ACETAMINOPHEN 5-325 MG PO TABS
1.0000 | ORAL_TABLET | ORAL | Status: DC | PRN
  Administered 2017-09-28: 1 via ORAL
  Filled 2017-09-28: qty 1

## 2017-09-28 NOTE — ED Provider Notes (Signed)
TIME SEEN: 5:56 AM  CHIEF COMPLAINT: Left ankle injury  HPI: Patient is a 42 year old female with history of chronic back pain, hypertension who presents to the emergency department with complaints of left ankle pain.  States that she was walking her dog when he pulled her down to the ground and she twisted her left ankle.  No head injury.  No neck or back pain.  No numbness, tingling or focal weakness.  Unable to bear weight after fall.  ROS: See HPI Constitutional: no fever  Eyes: no drainage  ENT: no runny nose   Cardiovascular:  no chest pain  Resp: no SOB  GI: no vomiting GU: no dysuria Integumentary: no rash  Allergy: no hives  Musculoskeletal: no leg swelling  Neurological: no slurred speech ROS otherwise negative  PAST MEDICAL HISTORY/PAST SURGICAL HISTORY:  Past Medical History:  Diagnosis Date  . Allergy   . Anemia   . Anxiety    h/o panic attack, due to cysts in axilla area & requiring I&D, last episode, 2011,   . 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 Back pain   . 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 GERD (gastroesophageal reflux disease)   . Hypotension    per pt.   . Kidney infection    1998- hosp.- [facility]  . Numbness and tingling    right leg   . Recurrent upper respiratory infection (URI)    viral infection - URI- 10/2011, penicillin treatmernt   . Seizures ([place])    side effect of Tamiflu- 2014 last seizure per pt7-31-18    MEDICATIONS:  Prior to Admission medications   Medication Sig Start Date End Date Taking? Authorizing Provider  atomoxetine (STRATTERA) 80 MG capsule Take 1 capsule (80 mg total) by mouth daily. 09/02/17   Ann Held, DO  cyclobenzaprine (FLEXERIL) 10 MG tablet Take 1 tablet (10 mg total) by mouth 3 (three) times daily as needed for muscle spasms. 05/20/17   Roma Schanz R, DO  gabapentin (NEURONTIN) 100 MG capsule TAKE 1 CAPSULE BY MOUTH THREE TIMES A DAY 08/19/17   Ann Held, DO  levonorgestrel (MIRENA) 20 MCG/24HR IUD 1  each by Intrauterine route once.    [provider]  omeprazole (PRILOSEC) 40 MG capsule Take 1 capsule (40 mg total) by mouth 2 (two) times daily. 03/18/17   Mauri Pole, MD  SOOLANTRA 1 % CREA Apply 1 application topically daily. Reported on 02/22/2016 10/26/15   [provider]  TURMERIC PO Take by mouth.    [provider]  valACYclovir (VALTREX) 1000 MG tablet Take 1 tablet (1,000 mg total) by mouth 3 (three) times daily as needed. For flare ups 12/22/14   Carollee Herter, Kendrick Fries R, DO  Norgestim-Eth Estrad Triphasic [facility] TRI-CYCLEN, 28, PO) Take 0.035 mg by mouth daily.   10/30/11  [provider]    ALLERGIES:  Allergies  Allergen Reactions  . Tamiflu [Oseltamivir Phosphate] Other (See Comments)    Seizure  . Ciprofloxacin Diarrhea and Nausea And Vomiting  . Metoclopramide Hcl Other (See Comments)    "lock jaw"  . Sulfa Antibiotics Nausea And Vomiting    SOCIAL HISTORY:  Social History   Tobacco Use  . Smoking status: Current Every Day Smoker    Packs/day: 0.25    Years: 15.00    Pack years: 3.75    Types: E-cigarettes, Cigarettes  . Smokeless tobacco: Never Used  . Tobacco comment: 1 cig a day- using  e cig now mostly  Substance Use Topics  . Alcohol use: Yes    Alcohol/week: 0.0 oz    Comment: a glass of wine  on occas.     FAMILY HISTORY: Family History  Problem Relation Age of Onset  . Lupus Mother   . Breast cancer Maternal Aunt   . Anesthesia problems Neg Hx   . Colon cancer Neg Hx   . Colon polyps Neg Hx   . Esophageal cancer Neg Hx   . Rectal cancer Neg Hx   . Stomach cancer Neg Hx     EXAM: BP (!) 155/106 (BP Location: Left Arm)   Pulse (!) 101   Temp 98.1 F (36.7 C) (Oral)   Resp 20   Ht 5\' 3"  (1.6 m)   Wt 61.7 kg (136 lb)   SpO2 98%   BMI 24.09 kg/m  CONSTITUTIONAL: Alert and oriented and responds appropriately to questions. Well-appearing; well-nourished; GCS 15 HEAD: Normocephalic; atraumatic EYES:  Conjunctivae clear, PERRL, EOMI ENT: normal nose; no rhinorrhea; moist mucous membranes; pharynx without lesions noted; no dental injury; no septal hematoma NECK: Supple, no meningismus, no LAD; no midline spinal tenderness, step-off or deformity; trachea midline CARD: RRR; S1 and S2 appreciated; no murmurs, no clicks, no rubs, no gallops RESP: Normal chest excursion without splinting or tachypnea; breath sounds clear and equal bilaterally; no wheezes, no rhonchi, no rales; no hypoxia or respiratory distress CHEST:  chest wall stable, no crepitus or ecchymosis or deformity, nontender to palpation; no flail chest ABD/GI: Normal bowel sounds; non-distended; soft, non-tender, no rebound, no guarding; no ecchymosis or other lesions noted PELVIS:  stable, nontender to palpation BACK:  The back appears normal and is non-tender to palpation, there is no CVA tenderness; no midline spinal tenderness, step-off or deformity EXT: Patient has tenderness to palpation over the ankle diffusely with ecchymosis and swelling.  Unable to test ligamentous laxity of this joint.  No tenderness over the left proximal fibular head.  Otherwise normal ROM in all joints; otherwise extremities are non-tender to palpation; no edema; normal capillary refill; no cyanosis, 2+ DP and radial pulses bilaterally.  No joint effusion, compartments are soft, extremities are warm and well-perfused SKIN: Normal color for age and race; warm NEURO: Moves all extremities equally, normal sensation diffusely PSYCH: The patient's mood and manner are appropriate. Grooming and personal hygiene are appropriate.  MEDICAL DECISION MAKING: Patient here with left ankle injury.  X-ray shows displaced oblique fracture of the distal fibula with also minimally displaced fracture of the posterior malleolus.  We will place her in a posterior short leg splint with stirrups and keep her nonweightbearing.  Will discuss with orthopedics on call.  She states she has  been followed by [facility] orth[facility]ics in the past with Dr. Tonita Cong.  Percocet has controlled her pain well.  ED PROGRESS:     5:55 AM  D/w Dr. Onnie Graham with [facility].  Appreciate his help.  He agrees with splint and outpatient follow-up.  Agrees with keeping patient nonweightbearing.  We have discussed with patient and husband the importance of elevation, rest.  Will discharge with prescription of Percocet and Zofran.  Patient comfortable with this plan.  She will follow-up with orthopedics as an outpatient.  Have instructed her to call his office in the morning on Monday.   At this time, I do not feel there is any life-threatening condition present. I have reviewed and discussed all results (EKG, imaging, lab, urine as appropriate) and exam findings with patient/family.  I have reviewed nursing notes and appropriate previous records.  I feel the patient is safe to be discharged home without further emergent workup and can continue workup as an outpatient as needed. Discussed usual and customary return precautions. Patient/family verbalize understanding and are comfortable with this plan.  Outpatient follow-up has been provided if needed. All questions have been answered.    SPLINT APPLICATION Date/Time: 9:67 AM Authorized by: Cyril Mourning Judith Demps Consent: Verbal consent obtained. Risks and benefits: risks, benefits and alternatives were discussed Consent given by: patient Splint applied by: orthopedic technician Location details: Left leg Splint type: Posterior splint with stirrups Supplies used: Fiberglass Post-procedure: The splinted body part was neurovascularly unchanged following the procedure. Patient tolerance: Patient tolerated the procedure well with no immediate complications.          Chelli Yerkes, Delice Bison, DO 09/28/17 [phone]

## 2017-09-28 NOTE — ED Notes (Signed)
Ortho at bedside splinting ankle 

## 2017-09-28 NOTE — ED Triage Notes (Signed)
Patient was walking a dog and the dog made her trip. Patient left ankle is swollen on both sides of the ankle. Patient is in pain.

## 2017-09-28 NOTE — ED Notes (Signed)
Bed: WTR6 Expected date:  Expected time:  Means of arrival:  Comments: 

## 2017-09-28 NOTE — ED Notes (Signed)
Patient waiting on ortho

## 2017-10-02 ENCOUNTER — Other Ambulatory Visit: Payer: Self-pay | Admitting: Orthopedic Surgery

## 2017-10-02 ENCOUNTER — Other Ambulatory Visit: Payer: Self-pay

## 2017-10-02 ENCOUNTER — Encounter (HOSPITAL_BASED_OUTPATIENT_CLINIC_OR_DEPARTMENT_OTHER): Payer: Self-pay | Admitting: *Deleted

## 2017-10-02 DIAGNOSIS — M25572 Pain in left ankle and joints of left foot: Secondary | ICD-10-CM | POA: Diagnosis not present

## 2017-10-02 DIAGNOSIS — S82842A Displaced bimalleolar fracture of left lower leg, initial encounter for closed fracture: Secondary | ICD-10-CM | POA: Diagnosis not present

## 2017-10-03 ENCOUNTER — Ambulatory Visit (HOSPITAL_BASED_OUTPATIENT_CLINIC_OR_DEPARTMENT_OTHER)
Admission: RE | Admit: 2017-10-03 | Discharge: 2017-10-03 | Disposition: A | Discharge status: Home / Self Care | Payer: PRIVATE HEALTH INSURANCE | Source: Ambulatory Visit | Attending: Orthopedic Surgery | Admitting: Orthopedic Surgery

## 2017-10-03 ENCOUNTER — Encounter (HOSPITAL_BASED_OUTPATIENT_CLINIC_OR_DEPARTMENT_OTHER): Payer: Self-pay | Admitting: Anesthesiology

## 2017-10-03 ENCOUNTER — Encounter (HOSPITAL_BASED_OUTPATIENT_CLINIC_OR_DEPARTMENT_OTHER)
Admission: RE | Disposition: A | Discharge status: Home / Self Care | Payer: Self-pay | Source: Ambulatory Visit | Attending: Orthopedic Surgery

## 2017-10-03 ENCOUNTER — Ambulatory Visit (HOSPITAL_BASED_OUTPATIENT_CLINIC_OR_DEPARTMENT_OTHER): Payer: PRIVATE HEALTH INSURANCE | Admitting: Anesthesiology

## 2017-10-03 ENCOUNTER — Other Ambulatory Visit: Payer: Self-pay

## 2017-10-03 DIAGNOSIS — F410 Panic disorder [episodic paroxysmal anxiety] without agoraphobia: Secondary | ICD-10-CM | POA: Diagnosis not present

## 2017-10-03 DIAGNOSIS — Y93K1 Activity, walking an animal: Secondary | ICD-10-CM | POA: Diagnosis not present

## 2017-10-03 DIAGNOSIS — Z79899 Other long term (current) drug therapy: Secondary | ICD-10-CM | POA: Diagnosis not present

## 2017-10-03 DIAGNOSIS — S82842A Displaced bimalleolar fracture of left lower leg, initial encounter for closed fracture: Secondary | ICD-10-CM | POA: Insufficient documentation

## 2017-10-03 DIAGNOSIS — Z882 Allergy status to sulfonamides status: Secondary | ICD-10-CM | POA: Diagnosis not present

## 2017-10-03 DIAGNOSIS — Z881 Allergy status to other antibiotic agents status: Secondary | ICD-10-CM | POA: Diagnosis not present

## 2017-10-03 DIAGNOSIS — Z87891 Personal history of nicotine dependence: Secondary | ICD-10-CM | POA: Insufficient documentation

## 2017-10-03 DIAGNOSIS — D649 Anemia, unspecified: Secondary | ICD-10-CM | POA: Diagnosis not present

## 2017-10-03 DIAGNOSIS — W1830XA Fall on same level, unspecified, initial encounter: Secondary | ICD-10-CM | POA: Diagnosis not present

## 2017-10-03 DIAGNOSIS — G8918 Other acute postprocedural pain: Secondary | ICD-10-CM | POA: Diagnosis not present

## 2017-10-03 DIAGNOSIS — Y92007 Garden or yard of unspecified non-institutional (private) residence as the place of occurrence of the external cause: Secondary | ICD-10-CM | POA: Diagnosis not present

## 2017-10-03 DIAGNOSIS — K219 Gastro-esophageal reflux disease without esophagitis: Secondary | ICD-10-CM | POA: Insufficient documentation

## 2017-10-03 HISTORY — DX: Displaced bimalleolar fracture of unspecified lower leg, initial encounter for closed fracture: S82.843A

## 2017-10-03 HISTORY — PX: ORIF ANKLE FRACTURE: SHX5408

## 2017-10-03 SURGERY — OPEN REDUCTION INTERNAL FIXATION (ORIF) ANKLE FRACTURE
Anesthesia: General | Site: Ankle | Laterality: Left

## 2017-10-03 MED ORDER — BUPIVACAINE-EPINEPHRINE (PF) 0.5% -1:200000 IJ SOLN
INTRAMUSCULAR | Status: DC | PRN
  Administered 2017-10-03: 30 mL via PERINEURAL

## 2017-10-03 MED ORDER — FENTANYL CITRATE (PF) 100 MCG/2ML IJ SOLN
INTRAMUSCULAR | Status: AC
  Filled 2017-10-03: qty 2

## 2017-10-03 MED ORDER — ROPIVACAINE HCL 7.5 MG/ML IJ SOLN
INTRAMUSCULAR | Status: DC | PRN
  Administered 2017-10-03: 20 mL via PERINEURAL

## 2017-10-03 MED ORDER — DEXAMETHASONE SODIUM PHOSPHATE 4 MG/ML IJ SOLN
INTRAMUSCULAR | Status: DC | PRN
  Administered 2017-10-03: 10 mg via INTRAVENOUS

## 2017-10-03 MED ORDER — SODIUM CHLORIDE 0.9 % IV SOLN: INTRAVENOUS | Status: DC

## 2017-10-03 MED ORDER — LIDOCAINE 2% (20 MG/ML) 5 ML SYRINGE
INTRAMUSCULAR | Status: DC | PRN
  Administered 2017-10-03: 40 mg via INTRAVENOUS

## 2017-10-03 MED ORDER — MEPERIDINE HCL 25 MG/ML IJ SOLN: 6.2500 mg | INTRAMUSCULAR | Status: DC | PRN

## 2017-10-03 MED ORDER — CEFAZOLIN SODIUM-DEXTROSE 2-4 GM/100ML-% IV SOLN
2.0000 g | INTRAVENOUS | Status: AC
  Administered 2017-10-03: 2 g via INTRAVENOUS

## 2017-10-03 MED ORDER — ONDANSETRON HCL 4 MG/2ML IJ SOLN: 4.0000 mg | Freq: Once | INTRAMUSCULAR | Status: DC | PRN

## 2017-10-03 MED ORDER — MIDAZOLAM HCL 2 MG/2ML IJ SOLN
1.0000 mg | INTRAMUSCULAR | Status: DC | PRN
  Administered 2017-10-03: 2 mg via INTRAVENOUS

## 2017-10-03 MED ORDER — HYDROMORPHONE HCL 1 MG/ML IJ SOLN: 0.2500 mg | INTRAMUSCULAR | Status: DC | PRN

## 2017-10-03 MED ORDER — FENTANYL CITRATE (PF) 100 MCG/2ML IJ SOLN
50.0000 ug | INTRAMUSCULAR | Status: DC | PRN
  Administered 2017-10-03: 50 ug via INTRAVENOUS
  Administered 2017-10-03: 100 ug via INTRAVENOUS

## 2017-10-03 MED ORDER — LACTATED RINGERS IV SOLN
INTRAVENOUS | Status: DC
  Administered 2017-10-03: 14:00:00 via INTRAVENOUS

## 2017-10-03 MED ORDER — SCOPOLAMINE 1 MG/3DAYS TD PT72: 1.0000 | MEDICATED_PATCH | Freq: Once | TRANSDERMAL | Status: DC | PRN

## 2017-10-03 MED ORDER — ONDANSETRON HCL 4 MG/2ML IJ SOLN
INTRAMUSCULAR | Status: DC | PRN
  Administered 2017-10-03: 4 mg via INTRAVENOUS

## 2017-10-03 MED ORDER — MIDAZOLAM HCL 2 MG/2ML IJ SOLN
INTRAMUSCULAR | Status: AC
  Filled 2017-10-03: qty 2

## 2017-10-03 MED ORDER — ENOXAPARIN SODIUM 40 MG/0.4ML ~~LOC~~ SOLN: 40.0000 mg | SUBCUTANEOUS | 0 refills | Status: DC

## 2017-10-03 MED ORDER — HYDROCODONE-ACETAMINOPHEN 7.5-325 MG PO TABS: 1.0000 | ORAL_TABLET | Freq: Once | ORAL | Status: DC | PRN

## 2017-10-03 MED ORDER — PROPOFOL 10 MG/ML IV BOLUS
INTRAVENOUS | Status: DC | PRN
  Administered 2017-10-03: 160 mg via INTRAVENOUS

## 2017-10-03 MED ORDER — CHLORHEXIDINE GLUCONATE 4 % EX LIQD: 60.0000 mL | Freq: Once | CUTANEOUS | Status: DC

## 2017-10-03 SURGICAL SUPPLY — 73 items
BANDAGE ESMARK 6X9 LF (GAUZE/BANDAGES/DRESSINGS) ×1 IMPLANT
BIT DRILL 2.5X2.75 QC CALB (BIT) ×3 IMPLANT
BIT DRILL 2.9 CANN QC NONSTRL (BIT) ×3 IMPLANT
BIT DRILL 3.5X5.5 QC CALB (BIT) ×3 IMPLANT
BLADE SURG 15 STRL LF DISP TIS (BLADE) ×2 IMPLANT
BLADE SURG 15 STRL SS (BLADE) ×4
BNDG COHESIVE 4X5 TAN STRL (GAUZE/BANDAGES/DRESSINGS) ×3 IMPLANT
BNDG COHESIVE 6X5 TAN STRL LF (GAUZE/BANDAGES/DRESSINGS) ×3 IMPLANT
BNDG ESMARK 4X9 LF (GAUZE/BANDAGES/DRESSINGS) IMPLANT
BNDG ESMARK 6X9 LF (GAUZE/BANDAGES/DRESSINGS) ×3
CANISTER SUCT 1200ML W/VALVE (MISCELLANEOUS) ×3 IMPLANT
CHLORAPREP W/TINT 26ML (MISCELLANEOUS) ×3 IMPLANT
COVER BACK TABLE 60X90IN (DRAPES) ×3 IMPLANT
CUFF TOURNIQUET SINGLE 34IN LL (TOURNIQUET CUFF) IMPLANT
DECANTER SPIKE VIAL GLASS SM (MISCELLANEOUS) IMPLANT
DRAPE EXTREMITY T 121X128X90 (DRAPE) ×3 IMPLANT
DRAPE OEC MINIVIEW 54X84 (DRAPES) ×3 IMPLANT
DRAPE U-SHAPE 47X51 STRL (DRAPES) ×3 IMPLANT
DRSG MEPITEL 4X7.2 (GAUZE/BANDAGES/DRESSINGS) ×3 IMPLANT
DRSG PAD ABDOMINAL 8X10 ST (GAUZE/BANDAGES/DRESSINGS) ×6 IMPLANT
ELECT REM PT RETURN 9FT ADLT (ELECTROSURGICAL) ×3
ELECTRODE REM PT RTRN 9FT ADLT (ELECTROSURGICAL) ×1 IMPLANT
GAUZE SPONGE 4X4 12PLY STRL (GAUZE/BANDAGES/DRESSINGS) ×3 IMPLANT
GLOVE BIO SURGEON STRL SZ8 (GLOVE) ×3 IMPLANT
GLOVE BIOGEL PI IND STRL 8 (GLOVE) ×2 IMPLANT
GLOVE BIOGEL PI INDICATOR 8 (GLOVE) ×4
GLOVE ECLIPSE 8.0 STRL XLNG CF (GLOVE) ×3 IMPLANT
GOWN STRL REUS W/ TWL LRG LVL3 (GOWN DISPOSABLE) ×1 IMPLANT
GOWN STRL REUS W/ TWL XL LVL3 (GOWN DISPOSABLE) ×2 IMPLANT
GOWN STRL REUS W/TWL LRG LVL3 (GOWN DISPOSABLE) ×2
GOWN STRL REUS W/TWL XL LVL3 (GOWN DISPOSABLE) ×4
K-WIRE ACE 1.6X6 (WIRE) ×3
KWIRE ACE 1.6X6 (WIRE) ×1 IMPLANT
NEEDLE HYPO 22GX1.5 SAFETY (NEEDLE) IMPLANT
NS IRRIG 1000ML POUR BTL (IV SOLUTION) ×3 IMPLANT
PACK BASIN DAY SURGERY FS (CUSTOM PROCEDURE TRAY) ×3 IMPLANT
PAD CAST 4YDX4 CTTN HI CHSV (CAST SUPPLIES) ×1 IMPLANT
PADDING CAST ABS 4INX4YD NS (CAST SUPPLIES)
PADDING CAST ABS COTTON 4X4 ST (CAST SUPPLIES) IMPLANT
PADDING CAST COTTON 4X4 STRL (CAST SUPPLIES) ×2
PADDING CAST COTTON 6X4 STRL (CAST SUPPLIES) ×3 IMPLANT
PENCIL BUTTON HOLSTER BLD 10FT (ELECTRODE) ×3 IMPLANT
PLATE ACE 100DEG 8HOLE (Plate) ×3 IMPLANT
SANITIZER HAND PURELL 535ML FO (MISCELLANEOUS) ×3 IMPLANT
SCREW ACE CAN 4.0 36M (Screw) ×3 IMPLANT
SCREW CORTICAL 3.5MM  16MM (Screw) ×2 IMPLANT
SCREW CORTICAL 3.5MM  20MM (Screw) ×4 IMPLANT
SCREW CORTICAL 3.5MM 14MM (Screw) ×9 IMPLANT
SCREW CORTICAL 3.5MM 16MM (Screw) ×1 IMPLANT
SCREW CORTICAL 3.5MM 18MM (Screw) ×3 IMPLANT
SCREW CORTICAL 3.5MM 20MM (Screw) ×2 IMPLANT
SHEET MEDIUM DRAPE 40X70 STRL (DRAPES) ×3 IMPLANT
SLEEVE SCD COMPRESS KNEE MED (MISCELLANEOUS) ×3 IMPLANT
SPLINT FAST PLASTER 5X30 (CAST SUPPLIES) ×40
SPLINT PLASTER CAST FAST 5X30 (CAST SUPPLIES) ×20 IMPLANT
SPONGE LAP 18X18 RF (DISPOSABLE) ×2 IMPLANT
STOCKINETTE 6  STRL (DRAPES) ×2
STOCKINETTE 6 STRL (DRAPES) ×1 IMPLANT
SUCTION FRAZIER HANDLE 10FR (MISCELLANEOUS) ×2
SUCTION TUBE FRAZIER 10FR DISP (MISCELLANEOUS) ×1 IMPLANT
SUT ETHILON 3 0 PS 1 (SUTURE) ×3 IMPLANT
SUT FIBERWIRE #2 38 T-5 BLUE (SUTURE)
SUT MNCRL AB 3-0 PS2 18 (SUTURE) IMPLANT
SUT VIC AB 0 SH 27 (SUTURE) IMPLANT
SUT VIC AB 2-0 SH 27 (SUTURE) ×2
SUT VIC AB 2-0 SH 27XBRD (SUTURE) ×1 IMPLANT
SUTURE FIBERWR #2 38 T-5 BLUE (SUTURE) IMPLANT
SYR BULB 3OZ (MISCELLANEOUS) ×3 IMPLANT
SYR CONTROL 10ML LL (SYRINGE) IMPLANT
TOWEL OR 17X24 6PK STRL BLUE (TOWEL DISPOSABLE) ×6 IMPLANT
TUBE CONNECTING 20'X1/4 (TUBING) ×1
TUBE CONNECTING 20X1/4 (TUBING) ×2 IMPLANT
UNDERPAD 30X30 (UNDERPADS AND DIAPERS) ×3 IMPLANT

## 2017-10-03 NOTE — H&P (Signed)
[name] is an 42 y.o. female.   Chief Complaint: Left ankle pain HPI: The patient is a 42 year old woman with past medical history significant for chronic pain after back injuries and back surgery.  She fell taking her dog out in her front yard injuring her left ankle.  She has a bimalleolar fracture and presents today for operative treatment of this displaced and unstable injury.  Past Medical History:  Diagnosis Date  . Allergy   . Anemia   . Anxiety    h/o panic attack, due to cysts in axilla area & requiring I&D, last episode, 2011,   . 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 Back pain   . Bimalleolar ankle fracture    left  . 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 GERD (gastroesophageal reflux disease)   . Hypotension    per pt.   . Kidney infection    1998- hosp.- [facility]  . Numbness and tingling    right leg   . Recurrent upper respiratory infection (URI)    viral infection - URI- 10/2011, penicillin treatmernt   . Seizures ([place])    side effect of Tamiflu- 2014 last seizure per pt7-31-18    Past Surgical History:  Procedure Laterality Date  . ANTERIOR LUMBAR FUSION  11/14/2011   Procedure: ANTERIOR LUMBAR FUSION 2 LEVELS;  Surgeon: Johnn Hai, MD;  Location: [place];  Service: Orthopedics;  Laterality: N/A;  ALIF L5-S1  . BACK SURGERY    . CESAREAN SECTION     2007- /w epidural  anesthesia   . COLONOSCOPY    . LUMBAR LAMINECTOMY/DECOMPRESSION MICRODISCECTOMY Right 12/15/2015   Procedure:  MICO LUMBER DECOMPRESSION L4-5 ON THE RIGHT, ;  Surgeon: Susa Day, MD;  Location: [facility];  Service: Orthopedics;  Laterality: Right;  . SPINE SURGERY     fusion  . UPPER GASTROINTESTINAL ENDOSCOPY    . WISDOM TOOTH EXTRACTION     young adult    Family History  Problem Relation Age of Onset  . Lupus Mother   . Breast cancer Maternal Aunt   . Anesthesia problems Neg Hx   . Colon cancer Neg Hx   . Colon polyps Neg Hx   . Esophageal cancer Neg Hx   . Rectal cancer Neg Hx   . Stomach  cancer Neg Hx    Social History:  reports that she quit smoking about 6 months ago. Her smoking use included e-cigarettes and cigarettes. She has a 3.75 pack-year smoking history. she has never used smokeless tobacco. She reports that she drinks alcohol. She reports that she does not use drugs.  Allergies:  Allergies  Allergen Reactions  . Tamiflu [Oseltamivir Phosphate] Other (See Comments)    Seizure  . Ciprofloxacin Diarrhea and Nausea And Vomiting  . Metoclopramide Hcl Other (See Comments)    "lock jaw"  . Sulfa Antibiotics Nausea And Vomiting    Medications Prior to Admission  Medication Sig Dispense Refill  . atomoxetine (STRATTERA) 80 MG capsule Take 1 capsule (80 mg total) by mouth daily. 90 capsule 3  . gabapentin (NEURONTIN) 100 MG capsule TAKE 1 CAPSULE BY MOUTH THREE TIMES A DAY 90 capsule 0  . levonorgestrel (MIRENA) 20 MCG/24HR IUD 1 each by Intrauterine route once.    Marland Kitchen omeprazole (PRILOSEC) 40 MG capsule Take 1 capsule (40 mg total) by mouth 2 (two) times daily. 60 capsule 11  . ondansetron (ZOFRAN ODT) 4 MG disintegrating tablet Take 1 tablet (4 mg total) by  mouth every 6 (six) hours as needed for nausea or vomiting. 20 tablet 0  . oxyCODONE-acetaminophen (PERCOCET/ROXICET) 5-325 MG tablet Take 2 tablets by mouth every 6 (six) hours as needed. 20 tablet 0  . TURMERIC PO Take by mouth.    . valACYclovir (VALTREX) 1000 MG tablet Take 1 tablet (1,000 mg total) by mouth 3 (three) times daily as needed. For flare ups 90 tablet 2    No results found for this or any previous visit (from the past 48 hour(s)). No results found.  ROS no recent fever, chills, nausea, vomiting or changes in her appetite.  Blood pressure (!) 134/95, pulse 99, temperature 98 F (36.7 C), temperature source Oral, resp. rate 18, height 5\' 3"  (1.6 m), weight 66.2 kg (146 lb), SpO2 96 %. Physical Exam  Well-nourished well-developed woman in no apparent distress.  Alert and oriented x4.  Mood and  affect are normal.  Extraocular motions are intact.  Respirations are unlabored.  Gait is nonweightbearing on the left.  Left ankle has healthy and intact skin.  Pulses are palpable.  No lymphadenopathy.  Sensibility to light touch is intact in the toes.  5 out of 5 strength in plantar flexion and dorsi flexion of the toes.  Assessment/Plan Left ankle bimalleolar fracture -to the operating room for open treatment with internal fixation.  The risks and benefits of the alternative treatment options have been discussed in detail.  The patient wishes to proceed with surgery and specifically understands risks of bleeding, infection, nerve damage, blood clots, need for additional surgery, amputation and death.   Wylene Simmer, MD 10/15/2017, 2:27 PM

## 2017-10-03 NOTE — Anesthesia Postprocedure Evaluation (Signed)
Anesthesia Post Note  Patient: [name]  Procedure(s) Performed: OPEN REDUCTION INTERNAL FIXATION (ORIF) ANKLE BIMALLEOLAR FRACTURE; possible deltoid ligament repair (Left Ankle)     Patient location during evaluation: PACU Anesthesia Type: General Level of consciousness: awake and alert and oriented Pain management: pain level controlled Vital Signs Assessment: post-procedure vital signs reviewed and stable Respiratory status: spontaneous breathing, nonlabored ventilation and respiratory function stable Cardiovascular status: blood pressure returned to baseline and stable Postop Assessment: no apparent nausea or vomiting Anesthetic complications: no    Last Vitals:  Vitals:   10/03/17 1530 10/03/17 1545  BP: 122/83 (!) 128/98  Pulse: 97 99  Resp: 12 20  Temp:    SpO2: 100% 97%    Last Pain:  Vitals:   10/03/17 1545  TempSrc:   PainSc: 0-No pain                 Jenet Durio A.

## 2017-10-03 NOTE — Transfer of Care (Signed)
Immediate Anesthesia Transfer of Care Note  Patient: [name]  Procedure(s) Performed: OPEN REDUCTION INTERNAL FIXATION (ORIF) ANKLE BIMALLEOLAR FRACTURE; possible deltoid ligament repair (Left Ankle)  Patient Location: PACU  Anesthesia Type:General  Level of Consciousness: awake and sedated  Airway & Oxygen Therapy: Patient Spontanous Breathing and Patient connected to face mask oxygen  Post-op Assessment: Report given to RN and Post -op Vital signs reviewed and stable  Post vital signs: Reviewed and stable  Last Vitals:  Vitals:   10/03/17 1351 10/03/17 1524  BP:  119/85  Pulse: 99 99  Resp: 18 19  Temp:  (P) 36.7 C  SpO2: 96% 100%    Last Pain:  Vitals:   10/03/17 1316  TempSrc: Oral  PainSc: 6       Patients Stated Pain Goal: 2 (62/83/66 2947)  Complications: No apparent anesthesia complications

## 2017-10-03 NOTE — Anesthesia Procedure Notes (Signed)
Anesthesia Regional Block: Popliteal block   Pre-Anesthetic Checklist: ,, timeout performed, Correct Patient, Correct Site, Correct Laterality, Correct Procedure, Correct Position, site marked, Risks and benefits discussed,  Surgical consent,  Pre-op evaluation,  At surgeon's request and post-op pain management  Laterality: Right and Left  Prep: chloraprep       Needles:  Injection technique: Single-shot  Needle Type: Echogenic Stimulator Needle     Needle Length: 10cm  Needle Gauge: 21   Needle insertion depth: 6 cm   Additional Needles:   Procedures:,,,, ultrasound used (permanent image in chart),,,,  Narrative:  Start time: 10/03/2017 1:33 PM End time: 10/03/2017 1:38 PM Injection made incrementally with aspirations every 5 mL.  Performed by: Personally  Anesthesiologist: Josephine Igo, MD  Additional Notes: Timeout performed. Patient sedated. Relevant anatomy ID'd using [place]. Incremental 2-20ml injection of LA with frequent aspiration. Patient tolerated procedure well.        Left Popliteal Block

## 2017-10-03 NOTE — Progress Notes (Signed)
Assisted Dr. Foster with left, ultrasound guided, popliteal/saphenous block. Side rails up, monitors on throughout procedure. See vital signs in flow sheet. Tolerated Procedure well. 

## 2017-10-03 NOTE — Discharge Instructions (Signed)
Regional Anesthesia Blocks  1. Numbness or the inability to move the "blocked" extremity may last from 3-48 hours after placement. The length of time depends on the medication injected and your individual response to the medication. If the numbness is not going away after 48 hours, call your surgeon.  2. The extremity that is blocked will need to be protected until the numbness is gone and the  Strength has returned. Because you cannot feel it, you will need to take extra care to avoid injury. Because it may be weak, you may have difficulty moving it or using it. You may not know what position it is in without looking at it while the block is in effect.  3. For blocks in the legs and feet, returning to weight bearing and walking needs to be done carefully. You will need to wait until the numbness is entirely gone and the strength has returned. You should be able to move your leg and foot normally before you try and bear weight or walk. You will need someone to be with you when you first try to ensure you do not fall and possibly risk injury.  4. Bruising and tenderness at the needle site are common side effects and will resolve in a few days.  5. Persistent numbness or new problems with movement should be communicated to the surgeon or the [facility] [phone] [place] [phone]).         Post Anesthesia Home Care Instructions  Activity: Get plenty of rest for the remainder of the day. A responsible individual must stay with you for 24 hours following the procedure.  For the next 24 hours, DO NOT: -Drive a car -Paediatric nurse -Drink alcoholic beverages -Take any medication unless instructed by your physician -Make any legal decisions or sign important papers.  Meals: Start with liquid foods such as gelatin or soup. Progress to regular foods as tolerated. Avoid greasy, spicy, heavy foods. If nausea and/or vomiting occur, drink only clear liquids  until the nausea and/or vomiting subsides. Call your physician if vomiting continues.  Special Instructions/Symptoms: Your throat may feel dry or sore from the anesthesia or the breathing tube placed in your throat during surgery. If this causes discomfort, gargle with warm salt water. The discomfort should disappear within 24 hours.  If you had a scopolamine patch placed behind your ear for the management of post- operative nausea and/or vomiting:  1. The medication in the patch is effective for 72 hours, after which it should be removed.  Wrap patch in a tissue and discard in the trash. Wash hands thoroughly with soap and water. 2. You may remove the patch earlier than 72 hours if you experience unpleasant side effects which may include dry mouth, dizziness or visual disturbances. 3. Avoid touching the patch. Wash your hands with soap and water after contact with the patch.   Wylene Simmer, MD [facility]  Please read the following information regarding your care after surgery.  Medications  You only need a prescription for the narcotic pain medicine (ex. oxycodone, Percocet, Norco).  All of the other medicines listed below are available over the counter. X Aleve 2 pills twice a day for the first 3 days after surgery. X acetominophen (Tylenol) 650 mg every 4-6 hours as you need for minor to moderate pain X oxycodone as prescribed for severe pain  Narcotic pain medicine (ex. oxycodone, Percocet, Vicodin) will cause constipation.  To prevent this problem, take the following  medicines while you are taking any pain medicine. x docusate sodium (Colace) 100 mg twice a day X senna (Senokot) 2 tablets twice a day  X To help prevent blood clots, take a Lovenox injection daily for two weeks starting on Friday March 1st.  Then take baby aspirin (81 mg) twice a day for another month.  You should also get up every hour while you are awake to move around.    Weight Bearing ? Bear weight  when you are able on your operated leg or foot. ? Bear weight only on your operated foot in the post-op shoe. ? Do not bear any weight on the operated leg or foot.  Cast / Splint / Dressing ? Keep your splint, cast or dressing clean and dry.  Dont put anything (coat hanger, pencil, etc) down inside of it.  If it gets damp, use a hair dryer on the cool setting to dry it.  If it gets soaked, call the office to schedule an appointment for a cast change. ? Remove your dressing 3 days after surgery and cover the incisions with dry dressings.    After your dressing, cast or splint is removed; you may shower, but do not soak or scrub the wound.  Allow the water to run over it, and then gently pat it dry.  Swelling It is normal for you to have swelling where you had surgery.  To reduce swelling and pain, keep your toes above your nose for at least 3 days after surgery.  It may be necessary to keep your foot or leg elevated for several weeks.  If it hurts, it should be elevated.  Follow Up Call my office at [phone] when you are discharged from the hospital or surgery center to schedule an appointment to be seen two weeks after surgery.  Call my office at [phone] if you develop a fever >101.5 F, nausea, vomiting, bleeding from the surgical site or severe pain.

## 2017-10-03 NOTE — Op Note (Signed)
10/03/2017  3:31 PM  PATIENT:  [name]  42 y.o. female  PRE-OPERATIVE DIAGNOSIS:  Left ankle bimalleolar fracture  POST-OPERATIVE DIAGNOSIS:  Left ankle bimalleolar fracture  Procedure(s): 1.  Open treatment of left ankle bimalleolar fracture with internal fixation (lateral and posterior malleolus) 2.  Stress examination of the left ankle under fluoroscopy 3.  AP, mortise and lateral radiographs of the left ankle   SURGEON:  Wylene Simmer, MD  ASSISTANT: Mechele Claude, PA-C  ANESTHESIA:   General, regional  EBL:  minimal   TOURNIQUET:   Total Tourniquet Time Documented: Thigh (Left) - 37 minutes Total: Thigh (Left) - 37 minutes  COMPLICATIONS:  None apparent  DISPOSITION:  Extubated, awake and stable to recovery.  INDICATION FOR PROCEDURE: The patient is a 42 year old female who was walking her dog in her front yard Sunday when the dog pulled her and she tripped on a root injuring her left ankle.  She was seen in the emergency department and immobilized in a splint.  Radiographs showed fractures of the lateral and posterior malleoli that are displaced.  She presents now for operative treatment of these unstable injuries.The risks and benefits of the alternative treatment options have been discussed in detail.  The patient wishes to proceed with surgery and specifically understands risks of bleeding, infection, nerve damage, blood clots, need for additional surgery, amputation and death.  PROCEDURE IN DETAIL:  After pre operative consent was obtained, and the correct operative site was identified, the patient was brought to the operating room and placed supine on the OR table.  Anesthesia was administered.  Pre-operative antibiotics were administered.  A surgical timeout was taken.  The left lower extremity was prepped and draped in standard sterile fashion the extremity was exsanguinated and the tourniquet was inflated to 250 mmHg.  A longitudinal incision was made over the  lateral malleolus.  Dissection was carried down through the subcutaneous tissues to the fracture site.  Fracture was cleaned of all hematoma and irrigated copiously.  Fracture was reduced and held with a tenaculum.  Lateral radiographs showed the posterior malleolus fracture still slightly displaced.  A Weber tenaculum was inserted around the posterior aspect of the fibula to the posterior malleolus fracture fragment.  A stab incision was made anteriorly.  The posterior fracture fragment was reduced and the tenaculum was tightened.  A K wire was then inserted from anterior to posterior across the fracture site.  A lateral radiograph confirmed appropriate reduction of the posterior malleolus fracture fragment.  The guidewire was overdrilled and a 4 mm partially-threaded cannulated screw from [employer] was inserted over the guidewire.  It was noted of excellent purchase and compressed the fracture site appropriately.  Attention was then returned to the lateral incision.  A 3.5 mm fully threaded lag screw was inserted from posterior to anterior across the fracture site.  An 8 hole one third tubular plate was then contoured to fit the lateral malleolus.  It was secured distally with 3 unicortical screws and proximally with 3 bicortical screws.  Final AP, mortise and lateral radiographs showed appropriate position and length of all hardware and appropriate reduction of both fractures.  A stress examination was then performed.  No widening of the ankle mortise or medial clear space was noted.  Both wounds were then irrigated copiously.  Deep subcutaneous tissues were approximated with Vicryl.  Superficial subcutaneous tissues were approximated with Monocryl.  Skin incisions were closed with nylon.  Sterile dressings were applied followed by a well-padded short  leg splint.  The tourniquet was released after application of the dressings at 37 minutes.  The patient was awakened from anesthesia and transported to the  recovery room in stable condition.   FOLLOW UP PLAN: Nonweightbearing on the left lower extremity.  Follow-up in 2 weeks for suture removal and conversion to a cam walker boot for early weightbearing and range of motion.  Lovenox 40 mg subcu daily for DVT prophylaxis due to the patient's smoking history.   RADIOGRAPHS: AP, mortise and lateral radiographs of the left ankle are obtained intraoperatively.  These show interval reduction and fixation of lateral and posterior malleolus fractures.  Hardware is appropriately positioned and of the appropriate lengths.  No other acute injuries are noted.

## 2017-10-03 NOTE — Anesthesia Procedure Notes (Signed)
Procedure Name: LMA Insertion Performed by: Karleen Seebeck W, CRNA Pre-anesthesia Checklist: Patient identified, Emergency Drugs available, Suction available and Patient being monitored Patient Re-evaluated:Patient Re-evaluated prior to induction Oxygen Delivery Method: Circle system utilized Preoxygenation: Pre-oxygenation with 100% oxygen Induction Type: IV induction Ventilation: Mask ventilation without difficulty LMA: LMA inserted LMA Size: 4.0 Number of attempts: 1 Placement Confirmation: positive ETCO2 Tube secured with: Tape Dental Injury: Teeth and Oropharynx as per pre-operative assessment        

## 2017-10-03 NOTE — Anesthesia Procedure Notes (Signed)
Anesthesia Regional Block: Adductor canal block   Pre-Anesthetic Checklist: ,, timeout performed, Correct Patient, Correct Site, Correct Laterality, Correct Procedure, Correct Position, site marked, Risks and benefits discussed,  Surgical consent,  Pre-op evaluation,  At surgeon's request and post-op pain management  Laterality: Left  Prep: chloraprep       Needles:  Injection technique: Single-shot  Needle Type: Echogenic Stimulator Needle     Needle Length: 10cm  Needle Gauge: 21   Needle insertion depth: 5 cm   Additional Needles:   Procedures:,,,, ultrasound used (permanent image in chart),,,,  Narrative:  Start time: 10/03/2017 1:39 PM End time: 10/03/2017 1:44 PM Injection made incrementally with aspirations every 5 mL.  Performed by: Personally  Anesthesiologist: Josephine Igo, MD  Additional Notes: Timeout performed. Patient sedated. Relevant anatomy ID'd using [place]. Incremental 2-32ml injection of LA with frequent aspiration. Patient tolerated procedure well.        Left Adductor Canal Block

## 2017-10-03 NOTE — Anesthesia Preprocedure Evaluation (Signed)
Anesthesia Evaluation  Patient identified by MRN, date of birth, ID band Patient awake    Reviewed: Allergy & Precautions, NPO status , Patient's Chart, lab work & pertinent test results  Airway Mallampati: II  TM Distance: >3 FB Neck ROM: Full    Dental no notable dental hx. (+) Teeth Intact   Pulmonary Recent URI , Resolved, former smoker,    Pulmonary exam normal breath sounds clear to auscultation       Cardiovascular negative cardio ROS Normal cardiovascular exam Rhythm:Regular Rate:Normal     Neuro/Psych Seizures -, Well Controlled,  PSYCHIATRIC DISORDERS Anxiety ADD   GI/Hepatic GERD  ,  Endo/Other    Renal/GU Renal disease  negative genitourinary   Musculoskeletal  (+) Arthritis , Osteoarthritis,  Left Bimalleolar Fx   Abdominal (+) - obese,   Peds  Hematology  (+) anemia ,   Anesthesia Other Findings   Reproductive/Obstetrics HSV                             Anesthesia Physical Anesthesia Plan  ASA: II  Anesthesia Plan: General   Post-op Pain Management:    Induction: Intravenous  PONV Risk Score and Plan: 4 or greater and Ondansetron, Dexamethasone, Midazolam and Treatment may vary due to age or medical condition  Airway Management Planned: LMA  Additional Equipment:   Intra-op Plan:   Post-operative Plan: Extubation in OR  Informed Consent: I have reviewed the patients History and Physical, chart, labs and discussed the procedure including the risks, benefits and alternatives for the proposed anesthesia with the patient or authorized representative who has indicated his/her understanding and acceptance.   Dental advisory given  Plan Discussed with: CRNA and Surgeon  Anesthesia Plan Comments:         Anesthesia Quick Evaluation

## 2017-10-04 ENCOUNTER — Encounter (HOSPITAL_BASED_OUTPATIENT_CLINIC_OR_DEPARTMENT_OTHER): Payer: Self-pay | Admitting: Orthopedic Surgery

## 2017-10-17 DIAGNOSIS — S82842D Displaced bimalleolar fracture of left lower leg, subsequent encounter for closed fracture with routine healing: Secondary | ICD-10-CM | POA: Diagnosis not present

## 2017-10-17 DIAGNOSIS — S82842A Displaced bimalleolar fracture of left lower leg, initial encounter for closed fracture: Secondary | ICD-10-CM | POA: Insufficient documentation

## 2017-10-23 ENCOUNTER — Encounter: Payer: Self-pay | Admitting: Family Medicine

## 2017-10-23 NOTE — Telephone Encounter (Signed)
I would say she could take it off to sleep but she should ask the surgeon

## 2017-10-24 ENCOUNTER — Other Ambulatory Visit: Payer: Self-pay | Admitting: Family Medicine

## 2017-11-18 DIAGNOSIS — S82842D Displaced bimalleolar fracture of left lower leg, subsequent encounter for closed fracture with routine healing: Secondary | ICD-10-CM | POA: Diagnosis not present

## 2017-12-02 DIAGNOSIS — M25572 Pain in left ankle and joints of left foot: Secondary | ICD-10-CM | POA: Diagnosis not present

## 2017-12-05 ENCOUNTER — Ambulatory Visit: Payer: BLUE CROSS/BLUE SHIELD | Admitting: Family Medicine

## 2017-12-09 ENCOUNTER — Ambulatory Visit: Payer: BLUE CROSS/BLUE SHIELD | Admitting: Medical

## 2017-12-09 ENCOUNTER — Encounter: Payer: Self-pay | Admitting: Medical

## 2017-12-09 VITALS — BP 139/90 | HR 110 | Temp 98.7°F | Resp 16 | Ht 63.0 in | Wt 144.4 lb

## 2017-12-09 DIAGNOSIS — R319 Hematuria, unspecified: Secondary | ICD-10-CM

## 2017-12-09 DIAGNOSIS — R300 Dysuria: Secondary | ICD-10-CM | POA: Diagnosis not present

## 2017-12-09 DIAGNOSIS — N390 Urinary tract infection, site not specified: Secondary | ICD-10-CM | POA: Diagnosis not present

## 2017-12-09 DIAGNOSIS — R3915 Urgency of urination: Secondary | ICD-10-CM

## 2017-12-09 LAB — POC URINALSYSI DIPSTICK (AUTOMATED)
Bilirubin, UA: NEGATIVE
Glucose, UA: NEGATIVE
KETONES UA: NEGATIVE
Nitrite, UA: NEGATIVE
SPEC GRAV UA: 1.015 (ref 1.010–1.025)
UROBILINOGEN UA: NEGATIVE U/dL — AB
pH, UA: 6.5 (ref 5.0–8.0)

## 2017-12-09 MED ORDER — PHENAZOPYRIDINE HCL 200 MG PO TABS
200.0000 mg | ORAL_TABLET | Freq: Three times a day (TID) | ORAL | 0 refills | Status: DC | PRN
Start: 2017-12-09 — End: 2018-01-20

## 2017-12-09 MED ORDER — NITROFURANTOIN MONOHYD MACRO 100 MG PO CAPS: 100.0000 mg | ORAL_CAPSULE | Freq: Two times a day (BID) | ORAL | 0 refills | Status: DC

## 2017-12-09 NOTE — Progress Notes (Signed)
Subjective:    Patient ID: [name], female    DOB: [birth year], 42 y.o.   MRN: [id number]  HPI  Pt in today reporting urinary symptoms since Saturday night.  Dysuria- yes Frequent urination-yes Hesitancy-yes Suprapubic pressure-yes Fever-no chills-no Nausea-no Vomiting-no CVA pain-no History of UTI-yes.(on time also kidney infection 1995) Gross hematuria-no Some cloudy appearance to urine.  Pt does have history of smoking.   Review of Systems  Constitutional: Negative for chills, diaphoresis and fatigue.  Respiratory: Negative for cough, chest tightness, shortness of breath and wheezing.   Cardiovascular: Negative for chest pain and palpitations.  Gastrointestinal: Negative for abdominal pain.  Genitourinary: Positive for dyspareunia, dysuria, frequency and urgency. Negative for flank pain and hematuria.  Musculoskeletal: Negative for back pain.  Hematological: Negative for adenopathy. Does not bruise/bleed easily.  Psychiatric/Behavioral: Negative for behavioral problems, confusion, hallucinations and sleep disturbance. The patient is not nervous/anxious and is not hyperactive.    Past Medical History:  Diagnosis Date  . Allergy   . Anemia   . Anxiety    h/o panic attack, due to cysts in axilla area & requiring I&D, last episode, 2011,   . 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 Back pain   . Bimalleolar ankle fracture    left  . 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 GERD (gastroesophageal reflux disease)   . Hypotension    per pt.   . Kidney infection    1998- hosp.- [facility]  . Numbness and tingling    right leg   . Recurrent upper respiratory infection (URI)    viral infection - URI- 10/2011, penicillin treatmernt   . Seizures ([place])    side effect of Tamiflu- 2014 last seizure per pt7-31-18     Social History   Socioeconomic History  . Marital status: Married    Spouse name: Not on file  . Number of children: Not on file  . Years of education: Not on file  .  Highest education level: Not on file  Occupational History  . Not on file  Social Needs  . Financial resource strain: Not on file  . Food insecurity:    Worry: Not on file    Inability: Not on file  . Transportation needs:    Medical: Not on file    Non-medical: Not on file  Tobacco Use  . Smoking status: Former Smoker    Packs/day: 0.25    Years: 15.00    Pack years: 3.75    Types: E-cigarettes, Cigarettes    Last attempt to quit: 04/01/2017    Years since quitting: 0.6  . Smokeless tobacco: Never Used  . Tobacco comment: vapes daily  Substance and Sexual Activity  . Alcohol use: Yes    Alcohol/week: 0.0 oz    Comment: a glass of wine  on occas.   . Drug use: No  . Sexual activity: Yes    Birth control/protection: IUD  Lifestyle  . Physical activity:    Days per week: Not on file    Minutes per session: Not on file  . Stress: Not on file  Relationships  . Social connections:    Talks on phone: Not on file    Gets together: Not on file    Attends religious service: Not on file    Active member of club or organization: Not on file    Attends meetings of clubs or organizations: Not on file    Relationship status: Not on file  .  Intimate partner violence:    Fear of current or ex partner: Not on file    Emotionally abused: Not on file    Physically abused: Not on file    Forced sexual activity: Not on file  Other Topics Concern  . Not on file  Social History Narrative  . Not on file    Past Surgical History:  Procedure Laterality Date  . ANTERIOR LUMBAR FUSION  11/14/2011   Procedure: ANTERIOR LUMBAR FUSION 2 LEVELS;  Surgeon: Johnn Hai, MD;  Location: [facility];  Service: Orthopedics;  Laterality: N/A;  ALIF L5-S1  . BACK SURGERY    . CESAREAN SECTION     2007- /w epidural  anesthesia   . COLONOSCOPY    . LUMBAR LAMINECTOMY/DECOMPRESSION MICRODISCECTOMY Right 12/15/2015   Procedure:  MICO LUMBER DECOMPRESSION L4-5 ON THE RIGHT, ;  Surgeon: Susa Day, MD;   Location: [facility];  Service: Orthopedics;  Laterality: Right;  . ORIF ANKLE FRACTURE Left 10/03/2017   Procedure: OPEN REDUCTION INTERNAL FIXATION (ORIF) ANKLE BIMALLEOLAR FRACTURE; possible deltoid ligament repair;  Surgeon: Wylene Simmer, MD;  Location: [facility];  Service: Orthopedics;  Laterality: Left;  . SPINE SURGERY     fusion  . UPPER GASTROINTESTINAL ENDOSCOPY    . WISDOM TOOTH EXTRACTION     young adult    Family History  Problem Relation Age of Onset  . Lupus Mother   . Breast cancer Maternal Aunt   . Anesthesia problems Neg Hx   . Colon cancer Neg Hx   . Colon polyps Neg Hx   . Esophageal cancer Neg Hx   . Rectal cancer Neg Hx   . Stomach cancer Neg Hx     Allergies  Allergen Reactions  . Tamiflu [Oseltamivir Phosphate] Other (See Comments)    Seizure  . Ciprofloxacin Diarrhea and Nausea And Vomiting  . Metoclopramide Hcl Other (See Comments)    "lock jaw"  . Sulfa Antibiotics Nausea And Vomiting    Current Outpatient Medications on File Prior to Visit  Medication Sig Dispense Refill  . atomoxetine (STRATTERA) 80 MG capsule Take 1 capsule (80 mg total) by mouth daily. 90 capsule 3  . enoxaparin (LOVENOX) 40 MG/0.4ML injection Inject 0.4 mLs (40 mg total) into the skin daily. 14 Syringe 0  . gabapentin (NEURONTIN) 100 MG capsule TAKE 1 CAPSULE BY MOUTH THREE TIMES A DAY 90 capsule 0  . levonorgestrel (MIRENA) 20 MCG/24HR IUD 1 each by Intrauterine route once.    Marland Kitchen omeprazole (PRILOSEC) 40 MG capsule Take 1 capsule (40 mg total) by mouth 2 (two) times daily. 60 capsule 11  . ondansetron (ZOFRAN ODT) 4 MG disintegrating tablet Take 1 tablet (4 mg total) by mouth every 6 (six) hours as needed for nausea or vomiting. 20 tablet 0  . oxyCODONE-acetaminophen (PERCOCET/ROXICET) 5-325 MG tablet Take 2 tablets by mouth every 6 (six) hours as needed. 20 tablet 0  . TURMERIC PO Take by mouth.    . valACYclovir (VALTREX) 1000 MG tablet Take 1 tablet (1,000 mg  total) by mouth 3 (three) times daily as needed. For flare ups 90 tablet 2  . [DISCONTINUED] Norgestim-Eth Radene Journey Triphasic (ORTHO TRI-CYCLEN, 28, PO) Take 0.035 mg by mouth daily.      No current facility-administered medications on file prior to visit.     BP (!) 156/100 (BP Location: Right Arm, Patient Position: Sitting, Cuff Size: Small)   Pulse (!) 110   Temp 98.7 F (37.1 C) (Oral)  Resp 16   Ht 5\' 3"  (1.6 m)   Wt 144 lb 6.4 oz (65.5 kg)   SpO2 100%   BMI 25.58 kg/m       Objective:   Physical Exam  General Appearance- Not in acute distress.  HEENT Eyes- Scleraeral/Conjuntiva-bilat- Not Yellow. Mouth & Throat- Normal.  Chest and Lung Exam Auscultation: Breath sounds:-Normal. Adventitious sounds:- No Adventitious sounds.  Cardiovascular Auscultation:Rythm - Regular. Heart Sounds -Normal heart sounds.  Abdomen Inspection:-Inspection Normal.  Palpation/Perucssion: Palpation and Percussion of the abdomen reveal- Non Tender, No Rebound tenderness, No rigidity(Guarding) and No Palpable abdominal masses.  Liver:-Normal.  Spleen:- Normal.   Back-  No cva tenderness      Assessment & Plan:  You appear to have a urinary tract infection. I am prescribing ,macrobid antibiotic for the probable infection. Hydrate well. I am sending out a urine culture. During the interim if your signs and symptoms worsen rather than improving please notify us. We will notify your when the culture results are back.  Rx pyridium for urinary pain.  Follow up in 7 days or as needed.  Do want to repeat urine in 10-14 days to make sure blood in urine clears with infection. Hx of smoking.(advised pt)

## 2017-12-09 NOTE — Patient Instructions (Addendum)
You appear to have a urinary tract infection. I am prescribing ,macrobid antibiotic for the probable infection. Hydrate well. I am sending out a urine culture. During the interim if your signs and symptoms worsen rather than improving please notify us. We will notify your when the culture results are back.  Rx pyridium for urinary pain.  Follow up in 7 days or as needed.

## 2017-12-10 DIAGNOSIS — M25572 Pain in left ankle and joints of left foot: Secondary | ICD-10-CM | POA: Diagnosis not present

## 2017-12-11 ENCOUNTER — Other Ambulatory Visit: Payer: Self-pay

## 2017-12-11 DIAGNOSIS — R300 Dysuria: Secondary | ICD-10-CM

## 2017-12-12 LAB — URINE CULTURE
MICRO NUMBER:: 90549281
SPECIMEN QUALITY: ADEQUATE

## 2017-12-13 ENCOUNTER — Other Ambulatory Visit: Payer: Self-pay | Admitting: Family Medicine

## 2017-12-17 DIAGNOSIS — S82842D Displaced bimalleolar fracture of left lower leg, subsequent encounter for closed fracture with routine healing: Secondary | ICD-10-CM | POA: Diagnosis not present

## 2017-12-23 ENCOUNTER — Other Ambulatory Visit (INDEPENDENT_AMBULATORY_CARE_PROVIDER_SITE_OTHER): Payer: BLUE CROSS/BLUE SHIELD

## 2017-12-23 DIAGNOSIS — R300 Dysuria: Secondary | ICD-10-CM

## 2017-12-23 LAB — POC URINALSYSI DIPSTICK (AUTOMATED)
Glucose, UA: NEGATIVE
KETONES UA: NEGATIVE
Leukocytes, UA: NEGATIVE
Nitrite, UA: NEGATIVE
RBC UA: NEGATIVE
Spec Grav, UA: >=1.03 — AB (ref 1.010–1.025)
UROBILINOGEN UA: 1 U/dL
pH, UA: 6 (ref 5.0–8.0)

## 2017-12-24 DIAGNOSIS — M25572 Pain in left ankle and joints of left foot: Secondary | ICD-10-CM | POA: Diagnosis not present

## 2018-01-01 DIAGNOSIS — M25572 Pain in left ankle and joints of left foot: Secondary | ICD-10-CM | POA: Diagnosis not present

## 2018-01-14 DIAGNOSIS — M25572 Pain in left ankle and joints of left foot: Secondary | ICD-10-CM | POA: Diagnosis not present

## 2018-01-17 ENCOUNTER — Ambulatory Visit: Payer: BLUE CROSS/BLUE SHIELD | Admitting: Internal Medicine

## 2018-01-19 ENCOUNTER — Other Ambulatory Visit: Payer: Self-pay | Admitting: Family Medicine

## 2018-01-20 ENCOUNTER — Ambulatory Visit: Payer: BLUE CROSS/BLUE SHIELD | Admitting: Family

## 2018-01-20 ENCOUNTER — Encounter: Payer: Self-pay | Admitting: Family

## 2018-01-20 ENCOUNTER — Telehealth: Payer: Self-pay | Admitting: Family

## 2018-01-20 ENCOUNTER — Ambulatory Visit: Payer: BLUE CROSS/BLUE SHIELD | Admitting: Family Medicine

## 2018-01-20 VITALS — BP 149/99 | HR 96 | Temp 98.5°F | Resp 16 | Ht 63.0 in | Wt 147.0 lb

## 2018-01-20 DIAGNOSIS — R300 Dysuria: Secondary | ICD-10-CM | POA: Diagnosis not present

## 2018-01-20 DIAGNOSIS — R030 Elevated blood-pressure reading, without diagnosis of hypertension: Secondary | ICD-10-CM

## 2018-01-20 DIAGNOSIS — N3001 Acute cystitis with hematuria: Secondary | ICD-10-CM

## 2018-01-20 DIAGNOSIS — H04123 Dry eye syndrome of bilateral lacrimal glands: Secondary | ICD-10-CM | POA: Diagnosis not present

## 2018-01-20 LAB — POCT URINALYSIS DIPSTICK
Bilirubin, UA: NEGATIVE
Blood, UA: 10
Glucose, UA: NEGATIVE
KETONES UA: NEGATIVE
Nitrite, UA: NEGATIVE
PH UA: 7.5 (ref 5.0–8.0)
PROTEIN UA: POSITIVE — AB
Spec Grav, UA: 1.01 (ref 1.010–1.025)
UROBILINOGEN UA: 0.2 U/dL

## 2018-01-20 MED ORDER — CEPHALEXIN 500 MG PO CAPS: 500.0000 mg | ORAL_CAPSULE | Freq: Two times a day (BID) | ORAL | 0 refills | Status: DC

## 2018-01-20 NOTE — Progress Notes (Signed)
Subjective:    Patient ID: [name], female    DOB: [birth year], 42 y.o.   MRN: [id number]  HPI  Patient is a 42 yr old female who presents today with chief complaint of dysuria/frequency.  Reports that symptoms have been present for 4 days. Denies abdominal pain.   Review of Systems    see HPI  Past Medical History:  Diagnosis Date  . Allergy   . Anemia   . Anxiety    h/o panic attack, due to cysts in axilla area & requiring I&D, last episode, 2011,   . 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 Back pain   . Bimalleolar ankle fracture    left  . 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 GERD (gastroesophageal reflux disease)   . Hypotension    per pt.   . Kidney infection    1998- hosp.- [facility]  . Numbness and tingling    right leg   . Recurrent upper respiratory infection (URI)    viral infection - URI- 10/2011, penicillin treatmernt   . Seizures ([place])    side effect of Tamiflu- 2014 last seizure per pt7-31-18     Social History   Socioeconomic History  . Marital status: Married    Spouse name: Not on file  . Number of children: Not on file  . Years of education: Not on file  . Highest education level: Not on file  Occupational History  . Not on file  Social Needs  . Financial resource strain: Not on file  . Food insecurity:    Worry: Not on file    Inability: Not on file  . Transportation needs:    Medical: Not on file    Non-medical: Not on file  Tobacco Use  . Smoking status: Former Smoker    Packs/day: 0.25    Years: 15.00    Pack years: 3.75    Types: E-cigarettes, Cigarettes    Last attempt to quit: 04/01/2017    Years since quitting: 0.8  . Smokeless tobacco: Never Used  . Tobacco comment: vapes daily  Substance and Sexual Activity  . Alcohol use: Yes    Alcohol/week: 0.0 oz    Comment: a glass of wine  on occas.   . Drug use: No  . Sexual activity: Yes    Birth control/protection: IUD  Lifestyle  . Physical activity:    Days per week: Not on file   Minutes per session: Not on file  . Stress: Not on file  Relationships  . Social connections:    Talks on phone: Not on file    Gets together: Not on file    Attends religious service: Not on file    Active member of club or organization: Not on file    Attends meetings of clubs or organizations: Not on file    Relationship status: Not on file  . Intimate partner violence:    Fear of current or ex partner: Not on file    Emotionally abused: Not on file    Physically abused: Not on file    Forced sexual activity: Not on file  Other Topics Concern  . Not on file  Social History Narrative  . Not on file    Past Surgical History:  Procedure Laterality Date  . ANTERIOR LUMBAR FUSION  11/14/2011   Procedure: ANTERIOR LUMBAR FUSION 2 LEVELS;  Surgeon: Johnn Hai, MD;  Location: [facility];  Service: Orthopedics;  Laterality: N/A;  ALIF L5-S1  . BACK SURGERY    . CESAREAN SECTION     2007- /w epidural  anesthesia   . COLONOSCOPY    . LUMBAR LAMINECTOMY/DECOMPRESSION MICRODISCECTOMY Right 12/15/2015   Procedure:  MICO LUMBER DECOMPRESSION L4-5 ON THE RIGHT, ;  Surgeon: Susa Day, MD;  Location: [facility];  Service: Orthopedics;  Laterality: Right;  . ORIF ANKLE FRACTURE Left 10/03/2017   Procedure: OPEN REDUCTION INTERNAL FIXATION (ORIF) ANKLE BIMALLEOLAR FRACTURE; possible deltoid ligament repair;  Surgeon: Wylene Simmer, MD;  Location: [facility];  Service: Orthopedics;  Laterality: Left;  . SPINE SURGERY     fusion  . UPPER GASTROINTESTINAL ENDOSCOPY    . WISDOM TOOTH EXTRACTION     young adult    Family History  Problem Relation Age of Onset  . Lupus Mother   . Breast cancer Maternal Aunt   . Anesthesia problems Neg Hx   . Colon cancer Neg Hx   . Colon polyps Neg Hx   . Esophageal cancer Neg Hx   . Rectal cancer Neg Hx   . Stomach cancer Neg Hx     Allergies  Allergen Reactions  . Tamiflu [Oseltamivir Phosphate] Other (See Comments)    Seizure  .  Ciprofloxacin Diarrhea and Nausea And Vomiting  . Metoclopramide Hcl Other (See Comments)    "lock jaw"  . Sulfa Antibiotics Nausea And Vomiting    Current Outpatient Medications on File Prior to Visit  Medication Sig Dispense Refill  . atomoxetine (STRATTERA) 80 MG capsule Take 1 capsule (80 mg total) by mouth daily. 90 capsule 3  . Cranberry, Vacc oxycoccus, (CRANBERRY EXTRACT) 200 MG CAPS Take by mouth.    . gabapentin (NEURONTIN) 100 MG capsule TAKE 1 CAPSULE BY MOUTH THREE TIMES A DAY 90 capsule 0  . levonorgestrel (MIRENA) 20 MCG/24HR IUD 1 each by Intrauterine route once.    Marland Kitchen omeprazole (PRILOSEC) 40 MG capsule Take 1 capsule (40 mg total) by mouth 2 (two) times daily. 60 capsule 11  . valACYclovir (VALTREX) 1000 MG tablet Take 1 tablet (1,000 mg total) by mouth 3 (three) times daily as needed. For flare ups 90 tablet 2  . TURMERIC PO Take by mouth.    . [DISCONTINUED] Norgestim-Eth Estrad Triphasic (ORTHO TRI-CYCLEN, 28, PO) Take 0.035 mg by mouth daily.      No current facility-administered medications on file prior to visit.     BP (!) 149/99 (BP Location: Left Arm, Patient Position: Sitting, Cuff Size: Normal)   Pulse 96   Temp 98.5 F (36.9 C)   Resp 16   Ht 5\' 3"  (1.6 m)   Wt 147 lb (66.7 kg)   SpO2 98%   BMI 26.04 kg/m    Objective:   Physical Exam  Constitutional: She appears well-developed and well-nourished.  Cardiovascular: Normal rate, regular rhythm and normal heart sounds.  No murmur heard. Pulmonary/Chest: Effort normal and breath sounds normal. No respiratory distress. She has no wheezes.  Psychiatric: She has a normal mood and affect. Her behavior is normal. Judgment and thought content normal.         Assessment & Plan:    UTI-urinalysis is negative for nitrites and negative for leukocytes.  She is noted to have 1+ blood.  Will send urine for culture.  Will begin empiric Keflex.  Advised the patient that if she has another urinary tract  infection in the near future that she should consider seeing a urologist.  Elevated blood pressure- we will have  patient follow-up with PCP for recheck. BP Readings from Last 3 Encounters:  01/20/18 (!) 149/99  12/09/17 139/90  10/03/17 (!) 150/99

## 2018-01-20 NOTE — Progress Notes (Signed)
Pt state she was recently treated for UTI, and has recurrent burning with urination and frequency X 4 days. Pt. Has been taking cranberry extract and cranberry juice, which has helped some per pt. report.

## 2018-01-20 NOTE — Progress Notes (Signed)
   Subjective:    Patient ID: [name], female    DOB: [birth year], 42 y.o.   MRN: [id number]  HPI    Review of Systems     Objective:   Physical Exam        Assessment & Plan:

## 2018-01-21 DIAGNOSIS — M25572 Pain in left ankle and joints of left foot: Secondary | ICD-10-CM | POA: Diagnosis not present

## 2018-01-21 NOTE — Telephone Encounter (Signed)
Please contact pt and let her know that when she was here yesterday her blood pressure was elevated. I would recommend that she schedule follow up with PCP for blood pressure recheck.

## 2018-01-22 ENCOUNTER — Encounter: Payer: Self-pay | Admitting: Family

## 2018-01-22 LAB — URINE CULTURE
MICRO NUMBER:: 90722458
SPECIMEN QUALITY: ADEQUATE

## 2018-01-27 ENCOUNTER — Encounter: Payer: Self-pay | Admitting: Family Medicine

## 2018-01-27 NOTE — Telephone Encounter (Signed)
Please see my note below. Thanks.

## 2018-01-27 NOTE — Telephone Encounter (Signed)
Left detailed message on pt's voicemail to call and schedule appt soon with Dr Carollee Herter as advised below. [place] for [facility] / triage to schedule appt.

## 2018-01-30 DIAGNOSIS — M25572 Pain in left ankle and joints of left foot: Secondary | ICD-10-CM | POA: Diagnosis not present

## 2018-02-04 DIAGNOSIS — M25572 Pain in left ankle and joints of left foot: Secondary | ICD-10-CM | POA: Diagnosis not present

## 2018-02-16 ENCOUNTER — Other Ambulatory Visit: Payer: Self-pay | Admitting: Family Medicine

## 2018-02-19 DIAGNOSIS — M25572 Pain in left ankle and joints of left foot: Secondary | ICD-10-CM | POA: Diagnosis not present

## 2018-03-13 DIAGNOSIS — M25572 Pain in left ankle and joints of left foot: Secondary | ICD-10-CM | POA: Diagnosis not present

## 2018-03-27 ENCOUNTER — Other Ambulatory Visit: Payer: Self-pay | Admitting: Gastroenterology

## 2018-03-27 DIAGNOSIS — K22719 Barrett's esophagus with dysplasia, unspecified: Secondary | ICD-10-CM

## 2018-03-27 DIAGNOSIS — K219 Gastro-esophageal reflux disease without esophagitis: Secondary | ICD-10-CM

## 2018-05-27 ENCOUNTER — Other Ambulatory Visit: Payer: Self-pay | Admitting: Medical

## 2018-06-29 ENCOUNTER — Other Ambulatory Visit: Payer: Self-pay | Admitting: Family Medicine

## 2018-06-30 DIAGNOSIS — S82842A Displaced bimalleolar fracture of left lower leg, initial encounter for closed fracture: Secondary | ICD-10-CM | POA: Diagnosis not present

## 2018-06-30 DIAGNOSIS — M25572 Pain in left ankle and joints of left foot: Secondary | ICD-10-CM | POA: Diagnosis not present

## 2018-06-30 DIAGNOSIS — Z978 Presence of other specified devices: Secondary | ICD-10-CM | POA: Diagnosis not present

## 2018-07-01 NOTE — Telephone Encounter (Signed)
Pt requesting refill on Strattera last ov 01/20/18.Please advise.

## 2018-07-28 ENCOUNTER — Ambulatory Visit: Payer: BLUE CROSS/BLUE SHIELD | Admitting: Nurse Practitioner

## 2018-07-28 ENCOUNTER — Encounter: Payer: Self-pay | Admitting: Nurse Practitioner

## 2018-07-28 DIAGNOSIS — S20212A Contusion of left front wall of thorax, initial encounter: Secondary | ICD-10-CM

## 2018-07-28 DIAGNOSIS — S161XXA Strain of muscle, fascia and tendon at neck level, initial encounter: Secondary | ICD-10-CM
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
## 2018-07-28 NOTE — Patient Instructions (Addendum)
Ok ro alternate between tylenol 500mg  every 6hrs as needed and ibuprofen 400mg  every 6hrs as needed with food for pain.  Cervical Sprain  A cervical sprain is a stretch or tear in the tissues that connect bones (ligaments) in the neck. Most neck (cervical) sprains get better in 4-6 weeks. Follow these instructions at home: If you have a neck collar:  Wear it as told by your doctor. Do not take off (do not remove) the collar unless your doctor says that this is safe.  Ask your doctor before adjusting your collar.  If you have long hair, keep it outside of the collar.  Ask your doctor if you may take off the collar for cleaning and bathing. If you may take off the collar: ? Follow instructions from your doctor about how to take off the collar safely. ? Clean the collar by wiping it with mild soap and water. Let it air-dry all the way. ? If your collar has removable pads:  Take the pads out every 1-2 days.  Hand wash the pads with soap and water.  Let the pads air-dry all the way before you put them back in the collar. Do not dry them in a clothes dryer. Do not dry them with a hair dryer. ? Check your skin under the collar for irritation or sores. If you see any, tell your doctor. Managing pain, stiffness, and swelling   Use a cervical traction device, if told by your doctor.  If told, put heat on the affected area. Do this before exercises (physical therapy) or as often as told by your doctor. Use the heat source that your doctor recommends, such as a moist heat pack or a heating pad. ? Place a towel between your skin and the heat source. ? Leave the heat on for 20-30 minutes. ? Take the heat off (remove the heat) if your skin turns bright red. This is very important if you cannot feel pain, heat, or cold. You may have a greater risk of getting burned.  Put ice on the affected area. ? Put ice in a plastic bag. ? Place a towel between your skin and the bag. ? Leave the ice on for  20 minutes, 2-3 times a day. Activity  Do not drive while wearing a neck collar. If you do not have a neck collar, ask your doctor if it is safe to drive.  Do not drive or use heavy machinery while taking prescription pain medicine or muscle relaxants, unless your doctor approves.  Do not lift anything that is heavier than 10 lb (4.5 kg) until your doctor tells you that it is safe.  Rest as told by your doctor.  Avoid activities that make you feel worse. Ask your doctor what activities are safe for you.  Do exercises as told by your doctor or physical therapist. Preventing neck sprain  Practice good posture. Adjust your workstation to help with this, if needed.  Exercise regularly as told by your doctor or physical therapist.  Avoid activities that are risky or may cause a neck sprain (cervical sprain). General instructions  Take over-the-counter and prescription medicines only as told by your doctor.  Do not use any products that contain nicotine or tobacco. This includes cigarettes and e-cigarettes. If you need help quitting, ask your doctor.  Keep all follow-up visits as told by your doctor. This is important. Contact a doctor if:  You have pain or other symptoms that get worse.  You have symptoms that  do not get better after 2 weeks.  You have pain that does not get better with medicine.  You start to have new, unexplained symptoms.  You have sores or irritated skin from wearing your neck collar. Get help right away if:  You have very bad pain.  You have any of the following in any part of your body: ? Loss of feeling (numbness). ? Tingling. ? Weakness.  You cannot move a part of your body (you have paralysis).  Your activity level does not improve. Summary  A cervical sprain is a stretch or tear in the tissues that connect bones (ligaments) in the neck.  If you have a neck (cervical) collar, do not take off the collar unless your doctor says that this is  safe.  Put ice on affected areas as told by your doctor.  Put heat on affected areas as told by your doctor.  Good posture and regular exercise can help prevent a neck sprain from happening again. This information is not intended to replace advice given to you by your health care provider. Make sure you discuss any questions you have with your health care provider. Document Released: 01/09/2008 Document Revised: 04/03/2016 Document Reviewed: 04/03/2016 Elsevier Interactive Patient Education  2019 [employer]   Post-Concussion Syndrome  A concussion is a brain injury from a direct hit (blow) to your head or body. This blow causes your brain to shake quickly back and forth inside your skull. This can damage brain cells and cause chemical changes in your brain. Concussions are usually not life-threatening but can cause several serious symptoms. Post-concussion syndrome is when symptoms that occur after a concussion last longer than normal. These symptoms can last from weeks to months. What are the causes? The cause of this condition is not known. It can happen whether your head injury was mild or severe. What increases the risk? You are more likely to develop this condition if:  You are female.  You are a child, teen, or young adult.  You had a past head injury.  You have a history of headaches.  You have depression or anxiety. What are the signs or symptoms? Physical symptoms  Headaches.  Tiredness.  Dizziness.  Weakness.  Blurry vision.  Sensitivity to light.  Hearing difficulties. Mental and emotional symptoms  Memory difficulties.  Difficulty with concentration.  Difficulty sleeping or staying asleep.  Feeling irritable.  Anxiety or depression.  Difficulty learning new things. How is this diagnosed? This condition may be diagnosed based on:  Your symptoms.  A description of your injury.  Your medical history. Your health care provider may order  other tests such as:  Brain function tests (neurological testing).  CT scan. How is this treated? Treatment for this condition may depend on your symptoms. Symptoms usually go away on their own over time. Treatments may include:  Medicines for headaches.  Resting your brain and body for a few days after your injury.  Rehabilitation therapy, such as: ? Physical or occupational therapy. This may include exercises to help with balance and dizziness. ? Mental health counseling. ? Speech therapy. ? Vision therapy. A brain and eye specialist can recommend treatments for vision problems. Follow these instructions at home: Medicines  Take over-the-counter and prescription medicines only as told by your health care provider.  Avoid opioid prescription pain medicines when recovering from a concussion. Activity  Limit your mental activities for the first few days after your injury, such as: ? Homework or job-related work. ?  Complex thinking. ? Watching TV, and using a computer or phone. ? Playing memory games and puzzles. ? Gradually return to your normal activity level. If a certain activity brings on your symptoms, stop or slow down until you can do the activity without it triggering your symptoms.  Limit physical activity, such as exercise or sports, for the first few days after a concussion. Gradually return to normal activity as told by your health care provider. ? If a certain activity brings on your symptoms, stop or slow down until you can do the activity without it triggering your symptoms.  Rest. Rest helps your brain heal. Make sure you: ? Get plenty of sleep at night. Most adults should get at least 7-9 hours of sleep each night. ? Rest during the day. Take naps or rest breaks when you feel tired.  Do not do high-risk activities that could cause a second concussion, such as riding a bike or playing sports. Having another concussion before the first one has healed can be  dangerous. General instructions  Do not drink alcohol until your health care provider says you can.  Keep track of the frequency and the severity of your symptoms. Give this information to your health care provider.  Keep all follow-up visits as directed by your health care provider. This is important. Contact a health care provider if:  Your symptoms do not improve.  You have another injury. Get help right away if you:  Have a severe or worsening headache.  Are confused.  Have trouble staying awake.  Pass out.  Vomit.  Have weakness or numbness in any part of your body.  Have a seizure.  Have trouble speaking. Summary  Post-concussion syndrome is when symptoms that occur after a concussion last longer than normal.  Symptoms usually go away on their own over time. Depending on your symptoms, you may need treatment, such as medicines or rehabilitation therapy.  Rest your brain and body for a few days after your injury. Gradually return to activities, as told by your health care provider.  Get plenty of sleep, and avoid alcohol and opioid pain medicines while recovering from a concussion. This information is not intended to replace advice given to you by your health care provider. Make sure you discuss any questions you have with your health care provider. Document Released: 01/12/2002 Document Revised: 08/27/2017 Document Reviewed: 08/27/2017 Elsevier Interactive Patient Education  2019 [employer].

## 2018-08-29 ENCOUNTER — Other Ambulatory Visit: Payer: Self-pay | Admitting: Family Medicine

## 2018-09-08 DIAGNOSIS — Z6827 Body mass index (BMI) 27.0-27.9, adult: Secondary | ICD-10-CM | POA: Diagnosis not present

## 2018-09-08 DIAGNOSIS — Z1231 Encounter for screening mammogram for malignant neoplasm of breast: Secondary | ICD-10-CM | POA: Diagnosis not present

## 2018-09-08 DIAGNOSIS — Z01419 Encounter for gynecological examination (general) (routine) without abnormal findings: Secondary | ICD-10-CM | POA: Diagnosis not present

## 2018-09-09 ENCOUNTER — Other Ambulatory Visit: Payer: Self-pay | Admitting: Obstetrics and Gynecology

## 2018-09-09 DIAGNOSIS — R928 Other abnormal and inconclusive findings on diagnostic imaging of breast: Secondary | ICD-10-CM

## 2018-09-12 ENCOUNTER — Other Ambulatory Visit: Payer: Self-pay | Admitting: Obstetrics and Gynecology

## 2018-09-12 ENCOUNTER — Ambulatory Visit
Admission: RE | Admit: 2018-09-12 | Discharge: 2018-09-12 | Disposition: A | Discharge status: Home / Self Care | Payer: BLUE CROSS/BLUE SHIELD | Source: Ambulatory Visit | Attending: Obstetrics and Gynecology | Admitting: Obstetrics and Gynecology

## 2018-09-12 DIAGNOSIS — R921 Mammographic calcification found on diagnostic imaging of breast: Secondary | ICD-10-CM

## 2018-09-12 DIAGNOSIS — R928 Other abnormal and inconclusive findings on diagnostic imaging of breast: Secondary | ICD-10-CM

## 2018-09-16 DIAGNOSIS — Z30433 Encounter for removal and reinsertion of intrauterine contraceptive device: Secondary | ICD-10-CM | POA: Diagnosis not present

## 2018-09-16 DIAGNOSIS — N951 Menopausal and female climacteric states: Secondary | ICD-10-CM | POA: Diagnosis not present

## 2018-09-16 DIAGNOSIS — Z30430 Encounter for insertion of intrauterine contraceptive device: Secondary | ICD-10-CM | POA: Diagnosis not present

## 2018-09-17 ENCOUNTER — Ambulatory Visit
Admission: RE | Admit: 2018-09-17 | Discharge: 2018-09-17 | Disposition: A | Discharge status: Home / Self Care | Payer: BLUE CROSS/BLUE SHIELD | Source: Ambulatory Visit | Attending: Obstetrics and Gynecology | Admitting: Obstetrics and Gynecology

## 2018-09-17 ENCOUNTER — Other Ambulatory Visit: Payer: Self-pay | Admitting: Obstetrics and Gynecology

## 2018-09-17 DIAGNOSIS — R921 Mammographic calcification found on diagnostic imaging of breast: Secondary | ICD-10-CM

## 2018-09-17 DIAGNOSIS — N6012 Diffuse cystic mastopathy of left breast: Secondary | ICD-10-CM | POA: Diagnosis not present

## 2018-09-17 DIAGNOSIS — D0511 Intraductal carcinoma in situ of right breast: Secondary | ICD-10-CM | POA: Diagnosis not present

## 2018-09-18 ENCOUNTER — Other Ambulatory Visit: Payer: Self-pay | Admitting: Obstetrics and Gynecology

## 2018-09-18 DIAGNOSIS — C50911 Malignant neoplasm of unspecified site of right female breast: Secondary | ICD-10-CM

## 2018-09-19 ENCOUNTER — Telehealth: Payer: Self-pay | Admitting: Family Medicine

## 2018-09-19 ENCOUNTER — Telehealth: Payer: Self-pay | Admitting: *Deleted

## 2018-09-19 ENCOUNTER — Telehealth: Payer: Self-pay | Admitting: Oncology

## 2018-09-19 DIAGNOSIS — D0511 Intraductal carcinoma in situ of right breast: Secondary | ICD-10-CM | POA: Insufficient documentation

## 2018-09-19 MED ORDER — VALACYCLOVIR HCL 1 G PO TABS: 1000.0000 mg | ORAL_TABLET | Freq: Three times a day (TID) | ORAL | 2 refills | Status: DC | PRN

## 2018-09-19 NOTE — Telephone Encounter (Signed)
Copied from [place] [id number]. Topic: Quick Communication - Rx Refill/Question >> Sep 19, 2018  9:19 AM Margot Ables wrote: Medication: valACYclovir (VALTREX) 1000 MG tablet - pt having a flare up and has not filled in almost 2 years - pt is needing new RX sent to pharmacy  Has the patient contacted their pharmacy? Yes - RX expired 2 years ago Preferred Pharmacy (with phone number or street name): [id number] [address], [place] [phone] (Phone) [phone] (Fax)

## 2018-09-19 NOTE — Telephone Encounter (Signed)
Confirmed [facility] for 09/24/18 at 8:30am.  Instructions and contact information given.

## 2018-09-19 NOTE — Telephone Encounter (Signed)
Spoke with patient to confirm morning [facility] appointment for 2/19, packet emailed to patient

## 2018-09-19 NOTE — Telephone Encounter (Signed)
Rx sent 

## 2018-09-23 ENCOUNTER — Other Ambulatory Visit: Payer: Self-pay

## 2018-09-23 ENCOUNTER — Ambulatory Visit: Payer: Self-pay

## 2018-09-23 ENCOUNTER — Ambulatory Visit: Payer: BLUE CROSS/BLUE SHIELD | Admitting: General Surgery

## 2018-09-23 ENCOUNTER — Encounter: Payer: Self-pay | Admitting: General Surgery

## 2018-09-23 VITALS — BP 152/100 | HR 104 | Temp 98.1°F | Resp 16 | Ht 63.0 in | Wt 152.0 lb

## 2018-09-23 DIAGNOSIS — D0511 Intraductal carcinoma in situ of right breast: Secondary | ICD-10-CM | POA: Diagnosis not present

## 2018-09-23 DIAGNOSIS — N6313 Unspecified lump in the right breast, lower outer quadrant: Secondary | ICD-10-CM

## 2018-09-23 NOTE — Progress Notes (Signed)
Patient ID: [name], female   DOB: Dec 24, 1975, 43 y.o.   MRN: [id number]  Chief Complaint  Patient presents with  . Other    discuss breast cancer    HPI [name] is a 43 y.o. female here today to discuss right breast cancer. Patient had a mammogram on 09/12/2018 and biopsy on 09/17/2018.  The patient and her husband are close friends of a former CMA from this office.  She had been obtaining regular mammograms with Everlene Farrier,, MD during her gynecological visits.  This year was the first time abnormality was appreciated.  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  Husband, BJ is present at visit.  He manages a water bottling facility in [place].  Past Medical History:  Diagnosis Date  . Allergy   . Anemia   . Anxiety    h/o panic attack, due to cysts in axilla area & requiring I&D, last episode, 2011,   . 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 Back pain   . Bimalleolar ankle fracture    left  . 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 GERD (gastroesophageal reflux disease)   . Hypotension    per pt.   . Kidney infection    1998- hosp.- [facility]  . Numbness and tingling    right leg   . Recurrent upper respiratory infection (URI)    viral infection - URI- 10/2011, penicillin treatmernt   . Seizures ([place])    side effect of Tamiflu- 2014 last seizure per pt7-31-18    Past Surgical History:  Procedure Laterality Date  . ANTERIOR LUMBAR FUSION  11/14/2011   Procedure: ANTERIOR LUMBAR FUSION 2 LEVELS;  Surgeon: Johnn Hai, MD;  Location: [facility];  Service: Orthopedics;  Laterality: N/A;  ALIF L5-S1  . BACK SURGERY    . CESAREAN SECTION     2007- /w epidural  anesthesia   . COLONOSCOPY    . LUMBAR LAMINECTOMY/DECOMPRESSION MICRODISCECTOMY Right 12/15/2015   Procedure:  MICO LUMBER DECOMPRESSION L4-5 ON THE RIGHT, ;  Surgeon: Susa Day, MD;  Location: [facility];  Service: Orthopedics;  Laterality: Right;  . ORIF ANKLE FRACTURE Left 10/03/2017   Procedure:  OPEN REDUCTION INTERNAL FIXATION (ORIF) ANKLE BIMALLEOLAR FRACTURE; possible deltoid ligament repair;  Surgeon: Wylene Simmer, MD;  Location: [facility];  Service: Orthopedics;  Laterality: Left;  . SPINE SURGERY     fusion  . UPPER GASTROINTESTINAL ENDOSCOPY    . WISDOM TOOTH EXTRACTION     young adult    Family History  Problem Relation Age of Onset  . Lupus Mother   . Breast cancer Maternal Aunt   . Anesthesia problems Neg Hx   . Colon cancer Neg Hx   . Colon polyps Neg Hx   . Esophageal cancer Neg Hx   . Rectal cancer Neg Hx   . Stomach cancer Neg Hx     Social History Social History   Tobacco Use  . Smoking status: Former Smoker    Packs/day: 0.25    Years: 15.00    Pack years: 3.75    Types: E-cigarettes, Cigarettes    Last attempt to quit: 04/01/2017    Years since quitting: 1.4  . Smokeless tobacco: Never Used  . Tobacco comment: vapes daily  Substance Use Topics  . Alcohol use: Yes    Alcohol/week: 0.0 standard drinks    Comment: a glass of wine  on occas.   . Drug use:  No    Allergies  Allergen Reactions  . Tamiflu [Oseltamivir Phosphate] Other (See Comments)    Seizure  . Ciprofloxacin Diarrhea and Nausea And Vomiting  . Metoclopramide Hcl Other (See Comments)    "lock jaw"  . Sulfa Antibiotics Nausea And Vomiting    Current Outpatient Medications  Medication Sig Dispense Refill  . atomoxetine (STRATTERA) 40 MG capsule TAKE 1 CAPSULE BY MOUTH TWICE A DAY WITH A MEAL 60 capsule 17  . atomoxetine (STRATTERA) 80 MG capsule Take 1 capsule (80 mg total) by mouth daily. 90 capsule 3  . gabapentin (NEURONTIN) 100 MG capsule TAKE 1 CAPSULE BY MOUTH THREE TIMES A DAY 90 capsule 0  . levonorgestrel (MIRENA) 20 MCG/24HR IUD 1 each by Intrauterine route once.    Marland Kitchen omeprazole (PRILOSEC) 40 MG capsule TAKE 1 CAPSULE BY MOUTH TWICE A DAY 60 capsule 11  . TURMERIC PO Take by mouth.    . valACYclovir (VALTREX) 1000 MG tablet Take 1 tablet (1,000 mg  total) by mouth 3 (three) times daily as needed. For flare ups 90 tablet 2   No current facility-administered medications for this visit.     Review of Systems Review of Systems  Constitutional: Negative.   Respiratory: Negative.   Cardiovascular: Negative.     Blood pressure (!) 152/100, pulse (!) 104, temperature 98.1 F (36.7 C), temperature source Skin, resp. rate 16, height _0  (1.6 m), weight 152 lb (68.9 kg), SpO2 95 %.  Physical Exam Physical Exam Exam conducted with a chaperone present.  Constitutional:      Appearance: She is well-developed.  Eyes:     General: No scleral icterus.    Conjunctiva/sclera: Conjunctivae normal.  Neck:     Musculoskeletal: Neck supple.  Cardiovascular:     Rate and Rhythm: Normal rate and regular rhythm.     Heart sounds: Normal heart sounds.  Pulmonary:     Effort: Pulmonary effort is normal.     Breath sounds: Normal breath sounds.  Chest:     Breasts:        Right: Mass present. No inverted nipple, nipple discharge, skin change or tenderness.        Left: No inverted nipple, mass, nipple discharge, skin change or tenderness.    Lymphadenopathy:     Cervical: No cervical adenopathy.  Skin:    General: Skin is warm and dry.  Neurological:     Mental Status: She is alert and oriented to person, place, and time.     Data Reviewed September 17, 2018 right and left breast biopsy results: Diagnosis 1. Breast, right, needle core biopsy, calcs, lower, outer quadrant - DUCTAL CARCINOMA IN SITU WITH CALCIFICATIONS. - SEE COMMENT. IMMUNOHISTOCHEMICAL AND MORPHOMETRIC ANALYSIS PERFORMED MANUALLY Estrogen Receptor: 0%, NEGATIVE Progesterone Receptor: 0%, NEGATIVE 2. Breast, left, needle core biopsy, calcs, upper, outer quadrant - FIBROCYSTIC CHANGES WITH ADENOSIS AND CALCIFICATIONS. - THERE IS NO EVIDENCE OF MALIGNANCY. 1 of 2 FINAL for TASHYRA, ADDUCI ([id number])  09/18/2018) Enid Cutter MD  Mammograms completed at the  [facility] from September 12, 2018 through September 17, 2018 were reviewed.  Focal spot compression views of the right breast show a second area of calcifications in the far posterior aspect of the breast which have not been biopsied.  Scant intervening calcifications between the recently identified area of DCIS and the second cluster of calcifications.  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    Plan  At this time, the patient will contact the [facility] to arrange for biopsy of the second site (originally scheduled for today).  She already has an appointment with the [facility] hematology/oncology and radiation therapy physician scheduled for February 19.  She will keep this appointment.  Based on her knowledge now available there would be no indication for chemotherapy or antiestrogen therapy.  Options for management of the breast included mastectomy with or without immediate reconstruction or more likely wide local excision followed by postoperative radiation therapy.  Even if the second area in the right breast shows DCIS she is still a candidate for breast conservation.  The patient would be a candidate for genetic testing based on age.  Her family history is only one second-degree relative with breast cancer, and I think this is unlikely.  Ideally, expedited testing to be completed on the off chance she was BRCA 1/2+ which might change her treatment algorithm but not totally exclude the possibility of breast conservation.  She was asked to discuss evaluation by genetic counselor during her scheduled visit with medical oncology on February 19.  If this is not readily available we will arrange for her to do a teleconference with the genetic counselor at [facility].  At this time she is interested in having surgery and [place] but would likely do radiation  therapy in [place] based because of its proximity to her workplace  The patient will notify the office when she has her second biopsy completed so we can arrange for surgical follow-up.  We spent over 40 minutes, the majority of which was related to treatment planning during today's visit.  HPI, Physical Exam, Assessment and Plan have been scribed under the direction and in the presence of Hervey Ard, MD.  Gaspar Cola, CMA  [facility] Gitel Beste 09/24/2018, 7:00 AM

## 2018-09-23 NOTE — Progress Notes (Signed)
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 life-threatening.  If the whole breast is removed then all the ducts are removed and since the cancer cells are trapped in the ducts, the cure rate with mastectomy for noninvasive breast cancer is approximately 99%. Nevertheless we generally recommend lumpectomy, because there  is no survival advantage to mastectomy and because the cosmetic result is generally superior with breast conservation.  Since the patient is likely keeping her breast, there will be some risk of recurrence. The recurrence can only be in the same breast since, again, the cells are trapped in the ducts. There is no connection from one breast to the other. The risk of local recurrence is  cut by more than half with radiation, which is standard in this situation.  Anorah does qualify for genetics testing.  In patients who carry a deleterious mutation [for example in a  BRCA gene], the risk of a new breast cancer developing in the future may be sufficiently great that the patient may choose bilateral mastectomies. However if she wishes to keep her breasts in that situation it is safe to do so. That would require intensified screening, which generally means not only yearly mammography but a yearly breast MRI as well.   At this point Girtrude is leaning towards lumpectomy unless there is a deleterious mutation in which case she might consider bilateral mastectomies.  She has a good understanding of the overall plan. She agrees with it. She knows the goal of treatment in her case is cure. She will call with any problems that may develop before her next visit here.   Karlene Southard, [name], MD  09/24/18 6:46 PM Medical Oncology and Hematology [facility] [address] [place], [place] [address] Tel. [phone]    Fax. [phone]    I, Jacqualyn Posey am acting as a Education administrator for Chauncey Cruel, MD.   I, Lurline Del MD, have reviewed the above documentation for accuracy and completeness, and I agree with the above.

## 2018-09-24 ENCOUNTER — Ambulatory Visit
Admission: RE | Admit: 2018-09-24 | Discharge: 2018-09-24 | Disposition: A | Discharge status: Home / Self Care | Payer: BLUE CROSS/BLUE SHIELD | Source: Ambulatory Visit | Attending: Radiation Oncology | Admitting: Radiation Oncology

## 2018-09-24 ENCOUNTER — Ambulatory Visit (HOSPITAL_BASED_OUTPATIENT_CLINIC_OR_DEPARTMENT_OTHER): Payer: BLUE CROSS/BLUE SHIELD | Admitting: Licensed Clinical Social Worker

## 2018-09-24 ENCOUNTER — Ambulatory Visit: Payer: Self-pay | Admitting: Surgery

## 2018-09-24 ENCOUNTER — Other Ambulatory Visit: Payer: Self-pay | Admitting: Licensed Clinical Social Worker

## 2018-09-24 ENCOUNTER — Encounter: Payer: Self-pay | Admitting: Oncology

## 2018-09-24 ENCOUNTER — Inpatient Hospital Stay: Payer: BLUE CROSS/BLUE SHIELD

## 2018-09-24 ENCOUNTER — Inpatient Hospital Stay: Payer: BLUE CROSS/BLUE SHIELD | Attending: Oncology | Admitting: Oncology

## 2018-09-24 ENCOUNTER — Ambulatory Visit: Payer: BLUE CROSS/BLUE SHIELD | Admitting: Physical Therapy

## 2018-09-24 ENCOUNTER — Encounter: Payer: Self-pay | Admitting: Licensed Clinical Social Worker

## 2018-09-24 VITALS — BP 141/92 | HR 92 | Temp 99.0°F | Resp 20 | Ht 63.0 in | Wt 152.8 lb

## 2018-09-24 DIAGNOSIS — D0511 Intraductal carcinoma in situ of right breast: Secondary | ICD-10-CM

## 2018-09-24 DIAGNOSIS — Z803 Family history of malignant neoplasm of breast: Secondary | ICD-10-CM | POA: Diagnosis not present

## 2018-09-24 DIAGNOSIS — R921 Mammographic calcification found on diagnostic imaging of breast: Secondary | ICD-10-CM | POA: Diagnosis not present

## 2018-09-24 DIAGNOSIS — C50919 Malignant neoplasm of unspecified site of unspecified female breast: Secondary | ICD-10-CM | POA: Insufficient documentation

## 2018-09-24 DIAGNOSIS — Z170 Estrogen receptor positive status [ER+]: Secondary | ICD-10-CM | POA: Diagnosis not present

## 2018-09-24 DIAGNOSIS — Z171 Estrogen receptor negative status [ER-]: Secondary | ICD-10-CM | POA: Diagnosis not present

## 2018-09-24 LAB — CBC WITH DIFFERENTIAL (CANCER CENTER ONLY)
Abs Immature Granulocytes: 0.02 10*3/uL (ref 0.00–0.07)
BASOS PCT: 0 %
Basophils Absolute: 0 10*3/uL (ref 0.0–0.1)
EOS ABS: 0.1 10*3/uL (ref 0.0–0.5)
Eosinophils Relative: 3 %
HCT: 39.6 % (ref 36.0–46.0)
Hemoglobin: 13.3 g/dL (ref 12.0–15.0)
Immature Granulocytes: 0 %
LYMPHS PCT: 33 %
Lymphs Abs: 1.5 10*3/uL (ref 0.7–4.0)
MCH: 31.5 pg (ref 26.0–34.0)
MCHC: 33.6 g/dL (ref 30.0–36.0)
MCV: 93.8 fL (ref 80.0–100.0)
Monocytes Absolute: 0.7 10*3/uL (ref 0.1–1.0)
Monocytes Relative: 14 %
Neutro Abs: 2.3 10*3/uL (ref 1.7–7.7)
Neutrophils Relative %: 50 %
PLATELETS: 186 10*3/uL (ref 150–400)
RBC: 4.22 MIL/uL (ref 3.87–5.11)
RDW: 12.2 % (ref 11.5–15.5)
WBC Count: 4.6 10*3/uL (ref 4.0–10.5)
nRBC: 0 % (ref 0.0–0.2)

## 2018-09-24 LAB — CMP (CANCER CENTER ONLY)
ALT: 102 U/L — ABNORMAL HIGH (ref 0–44)
AST: 101 U/L — ABNORMAL HIGH (ref 15–41)
Albumin: 4.4 g/dL (ref 3.5–5.0)
Alkaline Phosphatase: 38 U/L (ref 38–126)
Anion gap: 12 (ref 5–15)
BUN: 10 mg/dL (ref 6–20)
CO2: 25 mmol/L (ref 22–32)
CREATININE: 0.78 mg/dL (ref 0.44–1.00)
Calcium: 8.8 mg/dL — ABNORMAL LOW (ref 8.9–10.3)
Chloride: 103 mmol/L (ref 98–111)
GFR, Est AFR Am: >60 mL/min (ref >60)
GFR, Estimated: >60 mL/min (ref >60)
Glucose, Bld: 104 mg/dL — ABNORMAL HIGH (ref 70–99)
Potassium: 3.4 mmol/L — ABNORMAL LOW (ref 3.5–5.1)
Sodium: 140 mmol/L (ref 135–145)
Total Bilirubin: 0.5 mg/dL (ref 0.3–1.2)
Total Protein: 8.3 g/dL — ABNORMAL HIGH (ref 6.5–8.1)

## 2018-09-24 NOTE — Progress Notes (Signed)
Nutrition Assessment  Reason for Assessment:  Pt seen in Breast Clinic  ASSESSMENT:   43 year old female with new diagnosis of breast cancer.  Past medical history reviewed.    Patient reports normal appetite  Medications:  reviewed  Labs: reviewed  Anthropometrics:   Height: 63 inches Weight: 152 lb BMI: 27   NUTRITION DIAGNOSIS: Food and nutrition related knowledge deficit related to new diagnosis of breast cancer as evidenced by no prior need for nutrition related information.  INTERVENTION:   Discussed and provided packet of information regarding nutritional tips for breast cancer patients.  Questions answered.  Teachback method used.  Contact information provided and patient knows to contact me with questions/concerns.    MONITORING, EVALUATION, and GOAL: Pt will consume a healthy plant based diet to maintain lean body mass throughout treatment.   Reiley Keisler B. Zenia Resides, [place], [place] Registered Dietitian [phone] (pager)
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
## 2018-09-24 NOTE — Progress Notes (Signed)
REFERRING PROVIDER: Chauncey Cruel, MD [place], [place] [address]  PRIMARY PROVIDER:  Carollee Herter, Alferd Apa, DO  PRIMARY REASON FOR VISIT:  1. Ductal carcinoma in situ (DCIS) of right breast   2. Family history of breast cancer      HISTORY OF PRESENT ILLNESS:   [name], a 43 y.o. female, was seen for a [facility] cancer genetics consultation in the multidisciplinary breast cancer clinic due to her recent diagnosis of DCIS.  [name] presents to clinic today to discuss the possibility of a hereditary predisposition to cancer, genetic testing, and to further clarify her future cancer risks, as well as potential cancer risks for family members.    In 2020, at the age of 78, [name] was diagnosed with DCIS of her right breast, ER-, PR-.  The current plan includes surgery, which she is still deciding about and will use results of genetics to help guide her decision, and adjuvant radiation as appropriate.   HORMONAL RISK FACTORS:  Menarche was at age 87.  First live birth at age 68.  OCP use for approximately 0 years.  Ovaries intact: yes.  Hysterectomy: no.  HRT use: 5 years.  Past Medical History:  Diagnosis Date  . Allergy   . Anemia   . Anxiety    h/o panic attack, due to cysts in axilla area & requiring I&D, last episode, 2011,   . 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 Back pain   . Bimalleolar ankle fracture    left  . Family history of breast cancer   . 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 GERD (gastroesophageal reflux disease)   . Hypertension   . Hypotension    per pt.   . Kidney infection    1998- hosp.- [facility]  . Numbness and tingling    right leg   . Recurrent upper respiratory infection (URI)    viral infection - URI- 10/2011, penicillin treatmernt   . Seizures ([place])    side effect of Tamiflu- 2014 last seizure per pt7-31-18    Past Surgical History:  Procedure Laterality Date  . ANTERIOR LUMBAR FUSION  11/14/2011   Procedure: ANTERIOR LUMBAR  FUSION 2 LEVELS;  Surgeon: Johnn Hai, MD;  Location: [facility];  Service: Orthopedics;  Laterality: N/A;  ALIF L5-S1  . BACK SURGERY    . CESAREAN SECTION     2007- /w epidural  anesthesia   . COLONOSCOPY    . LUMBAR LAMINECTOMY/DECOMPRESSION MICRODISCECTOMY Right 12/15/2015   Procedure:  MICO LUMBER DECOMPRESSION L4-5 ON THE RIGHT, ;  Surgeon: Susa Day, MD;  Location: [facility];  Service: Orthopedics;  Laterality: Right;  . ORIF ANKLE FRACTURE Left 10/03/2017   Procedure: OPEN REDUCTION INTERNAL FIXATION (ORIF) ANKLE BIMALLEOLAR FRACTURE; possible deltoid ligament repair;  Surgeon: Wylene Simmer, MD;  Location: [facility];  Service: Orthopedics;  Laterality: Left;  . SPINE SURGERY     fusion  . UPPER GASTROINTESTINAL ENDOSCOPY    . WISDOM TOOTH EXTRACTION     young adult    Social History   Socioeconomic History  . Marital status: Married    Spouse name: Not on file  . Number of children: Not on file  . Years of education: Not on file  . Highest education level: Not on file  Occupational History  . Not on file  Social Needs  . Financial resource strain: Not on file  . Food insecurity:    Worry:  Not on file    Inability: Not on file  . Transportation needs:    Medical: Not on file    Non-medical: Not on file  Tobacco Use  . Smoking status: Former Smoker    Packs/day: 0.25    Years: 15.00    Pack years: 3.75    Types: E-cigarettes, Cigarettes    Last attempt to quit: 04/01/2017    Years since quitting: 1.4  . Smokeless tobacco: Never Used  . Tobacco comment: vapes daily  Substance and Sexual Activity  . Alcohol use: Yes    Alcohol/week: 0.0 standard drinks    Comment: a glass of wine  on occas.   . Drug use: No  . Sexual activity: Yes    Birth control/protection: I.U.D.  Lifestyle  . Physical activity:    Days per week: Not on file    Minutes per session: Not on file  . Stress: Not on file  Relationships  . Social connections:    Talks on phone:  Not on file    Gets together: Not on file    Attends religious service: Not on file    Active member of club or organization: Not on file    Attends meetings of clubs or organizations: Not on file    Relationship status: Not on file  Other Topics Concern  . Not on file  Social History Narrative  . Not on file     FAMILY HISTORY:  We obtained a detailed, 4-generation family history.  Significant diagnoses are listed below: Family History  Problem Relation Age of Onset  . Lupus Mother   . Breast cancer Maternal Aunt   . Cancer Maternal Aunt   . Anesthesia problems Neg Hx   . Colon cancer Neg Hx   . Colon polyps Neg Hx   . Esophageal cancer Neg Hx   . Rectal cancer Neg Hx   . Stomach cancer Neg Hx     [name] has one son, age 36. She has 2 sisters, one is 43 and the other is 13, no cancers.  [name] father, [name], is living at 53. He has one sister who is living at 18, no cancer history. No cancers for her children ([name]'s paternal cousins). The patient's paternal grandfather died at 60, grandmother died at 44.   [name] mother is living at 27. She has one sister who is living at 64 that had a history of breast cancer diagnosed in her late 16s or early 2s. The patient also had 2 maternal uncles that are deceased. No cancers for maternal cousins. Her maternal grandparents both died in their 83s.  [name] is unaware of previous family history of genetic testing for hereditary cancer risks. Patient's maternal ancestors are of [place] descent, and paternal ancestors are of Scot[place]/Irish descent. There is no reported Ashkenazi Jewish ancestry. There is no known consanguinity.  GENETIC COUNSELING ASSESSMENT: [name] is a 43 y.o. female with a personal and family history of breast cancer which is somewhat suggestive of a Hereditary Cancer Predisposition Syndrome. We, therefore, discussed and recommended the following at today's visit.   DISCUSSION: We  discussed that about 5-10% of breast cancer cases are hereditary with most cases due to BRCA mutations. We reviewed the characteristics, features and inheritance patterns of hereditary cancer syndromes. We also discussed genetic testing, including the appropriate family members to test, the process of testing, insurance coverage and turn-around-time for results. We discussed the implications of a negative, positive  and/or variant of uncertain significant result. We recommended Ms. Vonderhaar pursue genetic testing for the [facility] STAT Breast Cancer Panel + Common Hereditary Cancers Panel.  The STAT Breast cancer panel offered by [facility] includes sequencing and rearrangement analysis for the following 9 genes:  ATM, BRCA1, BRCA2, CDH1, CHEK2, PALB2, PTEN, STK11 and TP53.    The Common Hereditary Cancers Panel offered by [facility] includes sequencing and/or deletion duplication testing of the following 47 genes: APC, ATM, AXIN2, BARD1, BMPR1A, BRCA1, BRCA2, BRIP1, CDH1, CDKN2A (p14ARF), CDKN2A (p16INK4a), CKD4, CHEK2, CTNNA1, DICER1, EPCAM (Deletion/duplication testing only), GREM1 (promoter region deletion/duplication testing only), KIT, MEN1, MLH1, MSH2, MSH3, MSH6, MUTYH, NBN, NF1, NHTL1, PALB2, PDGFRA, PMS2, POLD1, POLE, PTEN, RAD50, RAD51C, RAD51D, SDHB, SDHC, SDHD, SMAD4, SMARCA4. STK11, TP53, TSC1, TSC2, and VHL.  The following genes were evaluated for sequence changes only: SDHA and HOXB13 c.251G>A variant only.  We discussed that if she is found to have a mutation in one of these genes, it may impact surgical decisions, and alter future medical management recommendations such as increased cancer screenings and consideration of risk reducing surgeries.  A positive result could also have implications for the patient's family members.  A Negative result would mean we were unable to identify a hereditary component to her cancer, but does not rule out the possibility of a hereditary basis for her cancer.  There  could be mutations that are undetectable by current technology, or in genes not yet tested or identified to increase cancer risk.    We discussed the potential to find a Variant of Uncertain Significance or VUS.  These are variants that have not yet been identified as pathogenic or benign, and it is unknown if this variant is associated with increased cancer risk or if this is a normal finding.  Most VUS's are reclassified to benign or likely benign.   It should not be used to make medical management decisions. With time, we suspect the lab will determine the significance of any VUS's identified if any.   Based on Ms. Rick's personal and family history of cancer, she meets NCCN medical criteria for genetic testing. Despite that she meets criteria, she may still have an out of pocket cost. The lab will notify her of an OOP if any.  PLAN: After considering the risks, benefits, and limitations, Ms. Fuerte  provided informed consent to pursue genetic testing and the blood sample was sent to [facility] for analysis of the STAT Breast Cancer Panel + Common Hereditary Cancers Panel. Initial results should be available within approximately 1 weeks' time, at which point they will be disclosed by telephone to Ms. Ensminger, as will any additional recommendations warranted by these results. Ms. Maslin will receive a summary of her genetic counseling visit and a copy of her results once available. This information will also be available in Epic.   Lastly, we encouraged Ms. Smolinsky to remain in contact with cancer genetics annually so that we can continuously update the family history and inform her of any changes in cancer genetics and testing that may be of benefit for this family.   Ms.  Cavan questions were answered to her satisfaction today. Our contact information was provided should additional questions or concerns arise. Thank you for the referral and allowing [place] to share in the care of your patient.    Faith Rogue, MS Genetic Counselor [email or web address] Phone: [phone]   The patient was seen for a total of 15 minutes in face-to-face genetic counseling.  The patient  was accompanied today by her husband, BJ, and father, [name].

## 2018-09-25 ENCOUNTER — Telehealth: Payer: Self-pay | Admitting: *Deleted

## 2018-09-25 ENCOUNTER — Telehealth: Payer: Self-pay

## 2018-09-25 NOTE — Telephone Encounter (Signed)
Spoke with patient to inform of low potassium lab result.  Encouraged patient increase potassium intake with diet.  Patient voiced understanding and had no further needs.

## 2018-09-25 NOTE — Telephone Encounter (Signed)
-----   Message from Gardenia Phlegm, NP sent at 09/25/2018  2:02 PM EST ----- Patient potassium slightly low.  Please call and recommend increased potassium rich diet ----- Message ----- From: Interface, Lab In [place] Sent: 09/24/2018   9:00 AM EST To: Chauncey Cruel, MD

## 2018-09-25 NOTE — Telephone Encounter (Signed)
Received Surgical Pathology Report results from [facility]; forwarded to provider/SLS 02/20

## 2018-10-01 ENCOUNTER — Telehealth: Payer: Self-pay | Admitting: Genetics

## 2018-10-01 NOTE — Telephone Encounter (Signed)
Revealed negative genetic testing.   This normal result is reassuring and indicates that it is unlikely [name]'s cancer is due to a hereditary cause.  It is unlikely that there is an increased risk of another cancer due to a mutation in one of these genes.  However, genetic testing is not perfect, and cannot definitively rule out a hereditary cause.  It will be important for her to keep in contact with genetics to learn if any additional testing may be needed in the future.

## 2018-10-02 ENCOUNTER — Other Ambulatory Visit: Payer: Self-pay | Admitting: *Deleted

## 2018-10-02 ENCOUNTER — Telehealth: Payer: Self-pay | Admitting: *Deleted

## 2018-10-02 DIAGNOSIS — D0511 Intraductal carcinoma in situ of right breast: Secondary | ICD-10-CM

## 2018-10-02 NOTE — Progress Notes (Signed)
Mri brea

## 2018-10-04 ENCOUNTER — Ambulatory Visit (HOSPITAL_COMMUNITY)
Admission: RE | Admit: 2018-10-04 | Discharge: 2018-10-04 | Disposition: A | Discharge status: Home / Self Care | Payer: BLUE CROSS/BLUE SHIELD | Source: Ambulatory Visit | Attending: Surgery | Admitting: Surgery

## 2018-10-04 DIAGNOSIS — D0511 Intraductal carcinoma in situ of right breast: Secondary | ICD-10-CM | POA: Insufficient documentation

## 2018-10-04 MED ORDER — GADOBUTROL 1 MMOL/ML IV SOLN
7.5000 mL | Freq: Once | INTRAVENOUS | Status: AC | PRN
  Administered 2018-10-04: 7.5 mL via INTRAVENOUS

## 2018-10-06 ENCOUNTER — Encounter: Payer: Self-pay | Admitting: Licensed Clinical Social Worker

## 2018-10-06 ENCOUNTER — Ambulatory Visit: Payer: Self-pay | Admitting: Licensed Clinical Social Worker

## 2018-10-06 DIAGNOSIS — Z1379 Encounter for other screening for genetic and chromosomal anomalies: Secondary | ICD-10-CM | POA: Insufficient documentation

## 2018-10-06 NOTE — Progress Notes (Signed)
HPI:  [name] was previously seen in the [name] clinic on 09/24/2018 due to her recent diagnosis of breast cancer and family history of cancer and concerns regarding a hereditary predisposition to cancer. Please refer to our prior cancer genetics clinic note for more information regarding [name]'s medical, social and family histories, and our assessment and recommendations, at the time. [name] recent genetic test results were disclosed to her, as well as recommendations warranted by these results. These results and recommendations are discussed in more detail below.   FAMILY HISTORY:  We obtained a detailed, 4-generation family history.  Significant diagnoses are listed below: Family History  Problem Relation Age of Onset  . Lupus Mother   . Breast cancer Maternal Aunt   . Cancer Maternal Aunt   . Anesthesia problems Neg Hx   . Colon cancer Neg Hx   . Colon polyps Neg Hx   . Esophageal cancer Neg Hx   . Rectal cancer Neg Hx   . Stomach cancer Neg Hx    [name] has one son, age 86. She has 2 sisters, one is 6 and the other is 19, no cancers.  [name] father, [name], is living at 62. He has one sister who is living at 69, no cancer history. No cancers for her children ([name]'s paternal cousins). The patient's paternal grandfather died at 44, grandmother died at 63.   [name] mother is living at 71. She has one sister who is living at 68 that had a history of breast cancer diagnosed in her late 55s or early 67s. The patient also had 2 maternal uncles that are deceased. No cancers for maternal cousins. Her maternal grandparents both died in their 35s.  [name] is unaware of previous family history of genetic testing for hereditary cancer risks. Patient's maternal ancestors are of [place] descent, and paternal ancestors are of Scotch/Irish descent. There is no reported Ashkenazi Jewish ancestry. There is no known consanguinity.  GENETIC  TEST RESULTS: Genetic testing performed through [facility]'s Breast Cancer STAT Panel + Common Hereditary Cancers Panel reported out on 10/01/2018 showed no pathogenic mutations.   The STAT Breast cancer panel offered by [facility] includes sequencing and rearrangement analysis for the following 9 genes:  ATM, BRCA1, BRCA2, CDH1, CHEK2, PALB2, PTEN, STK11 and TP53.    The Common Hereditary Cancers Panel offered by [facility] includes sequencing and/or deletion duplication testing of the following 47 genes: APC, ATM, AXIN2, BARD1, BMPR1A, BRCA1, BRCA2, BRIP1, CDH1, CDKN2A (p14ARF), CDKN2A (p16INK4a), CKD4, CHEK2, CTNNA1, DICER1, EPCAM (Deletion/duplication testing only), GREM1 (promoter region deletion/duplication testing only), KIT, MEN1, MLH1, MSH2, MSH3, MSH6, MUTYH, NBN, NF1, NHTL1, PALB2, PDGFRA, PMS2, POLD1, POLE, PTEN, RAD50, RAD51C, RAD51D, SDHB, SDHC, SDHD, SMAD4, SMARCA4. STK11, TP53, TSC1, TSC2, and VHL.  The following genes were evaluated for sequence changes only: SDHA and HOXB13 c.251G>A variant only..  The test report will be scanned into EPIC and will be located under the Molecular Pathology section of the Results Review tab. A portion of the result report is included below for reference.     We discussed with [name] that because current genetic testing is not perfect, it is possible there may be a gene mutation in one of these genes that current testing cannot detect, but that chance is small.  We also discussed, that there could be another gene that has not yet been discovered, or that we have not yet tested, that is responsible for the cancer diagnoses in  the family. It is also possible there is a hereditary cause for the cancer in the family that [name] did not inherit and therefore was not identified in her testing.  Therefore, it is important to remain in touch with cancer genetics in the future so that we can continue to offer [name] the most up to date genetic testing.    ADDITIONAL GENETIC TESTING: We discussed with [name] that her genetic testing was fairly extensive.  If there are are genes identified to increase cancer risk that can be analyzed in the future, we would be happy to discuss and coordinate this testing at that time.    CANCER SCREENING RECOMMENDATIONS: [name] test result is considered negative (normal).  This means that we have not identified a hereditary cause for her personal and family history of cancer at this time.   This normal indicates that it is unlikely [name] has an increased risk of cancer due to a mutation in one of these genes.While reassuring, this does not definitively rule out a hereditary predisposition to cancer. It is still possible that there could be genetic mutations that are undetectable by current technology, or genetic mutations in genes that have not been tested or identified to increase cancer risk.  Therefore, it is recommended she continue to follow the cancer management and screening guidelines provided by her oncology and primary healthcare provider. An individual's cancer risk is not determined by genetic test results alone.  Overall cancer risk assessment includes additional factors such as personal medical history, family history, etc.  These should be used to make a personalized plan for cancer prevention and surveillance.    RECOMMENDATIONS FOR FAMILY MEMBERS:  Relatives in this family might be at some increased risk of developing cancer, over the general population risk, simply due to the family history of cancer.  We recommended women in this family have a yearly mammogram beginning at age 58, or 51 years younger than the earliest onset of cancer, an annual clinical breast exam, and perform monthly breast self-exams. Women in this family should also have a gynecological exam as recommended by their primary provider. All family members should have a colonoscopy as directed by their doctors.  All family  members should inform their physicians about the family history of cancer so their doctors can make the most appropriate screening recommendations for them.   FOLLOW-UP: Lastly, we discussed with Ms. Bowe that cancer genetics is a rapidly advancing field and it is possible that new genetic tests will be appropriate for her and/or her family members in the future. We encouraged her to remain in contact with cancer genetics on an annual basis so we can update her personal and family histories and let her know of advances in cancer genetics that may benefit this family.   Our contact number was provided. Ms. Towle questions were answered to her satisfaction, and she knows she is welcome to call us at anytime with additional questions or concerns.  Faith Rogue, MS Genetic Counselor [email or web address] Phone: [phone]

## 2018-10-07 NOTE — Telephone Encounter (Signed)
  Oncology Nurse Navigator Documentation  Navigator Location: [facility] (10/02/18 0900)   )Navigator Encounter Type: Telephone;[facility] Follow-up (10/02/18 0900) Telephone: [name] Call (10/02/18 0900)       Genetic Counseling Date: 09/24/18 (10/02/18 0900) Genetic Counseling Type: Urgent (10/02/18 0900)                                        Time Spent with Patient: 15 (10/02/18 0900)

## 2018-10-08 ENCOUNTER — Other Ambulatory Visit: Payer: Self-pay | Admitting: Family Medicine

## 2018-10-08 ENCOUNTER — Other Ambulatory Visit: Payer: Self-pay | Admitting: Surgery

## 2018-10-08 ENCOUNTER — Encounter: Payer: Self-pay | Admitting: *Deleted

## 2018-10-08 DIAGNOSIS — R921 Mammographic calcification found on diagnostic imaging of breast: Secondary | ICD-10-CM

## 2018-10-14 ENCOUNTER — Ambulatory Visit
Admission: RE | Admit: 2018-10-14 | Discharge: 2018-10-14 | Disposition: A | Discharge status: Home / Self Care | Payer: BLUE CROSS/BLUE SHIELD | Source: Ambulatory Visit | Attending: Surgery | Admitting: Surgery

## 2018-10-14 DIAGNOSIS — R921 Mammographic calcification found on diagnostic imaging of breast: Secondary | ICD-10-CM

## 2018-10-14 DIAGNOSIS — D0511 Intraductal carcinoma in situ of right breast: Secondary | ICD-10-CM | POA: Diagnosis not present

## 2018-10-15 ENCOUNTER — Ambulatory Visit: Payer: Self-pay | Admitting: Surgery

## 2018-10-15 DIAGNOSIS — D0511 Intraductal carcinoma in situ of right breast: Secondary | ICD-10-CM

## 2018-10-20 ENCOUNTER — Encounter: Payer: Self-pay | Admitting: *Deleted

## 2018-10-20 ENCOUNTER — Other Ambulatory Visit: Payer: Self-pay | Admitting: Surgery

## 2018-10-20 ENCOUNTER — Other Ambulatory Visit: Payer: Self-pay | Admitting: *Deleted

## 2018-10-20 ENCOUNTER — Telehealth: Payer: Self-pay | Admitting: *Deleted

## 2018-10-20 ENCOUNTER — Ambulatory Visit: Payer: Self-pay | Admitting: Surgery

## 2018-10-20 DIAGNOSIS — D0511 Intraductal carcinoma in situ of right breast: Secondary | ICD-10-CM | POA: Diagnosis not present

## 2018-10-20 NOTE — H&P (Signed)
[name] Documented: 10/20/2018 9:26 AM Location: [facility] Patient #: [id number] DOB: 02-07-1976 Undefined / Language: Suszanne Conners / Race: White Female  History of Present Illness Marcello Moores A. 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 She underwent additional biopsies of a 5 cm area of calcifications which showed intermediate to high-grade DCIS. She is here today to discuss surgical options. She had 2 areas in the right upper inner quadrant and one in the tail of Spence.  ADDITIONAL INFORMATION: 1. PROGNOSTIC INDICATORS Results: IMMUNOHISTOCHEMICAL AND MORPHOMETRIC ANALYSIS PERFORMED MANUALLY Estrogen Receptor: 0%, NEGATIVE Progesterone Receptor: 0%, NEGATIVE COMMENT: The negative hormone receptor study(ies) in this case has an internal positive control. REFERENCE RANGE ESTROGEN RECEPTOR NEGATIVE 0% POSITIVE =>1% REFERENCE RANGE PROGESTERONE RECEPTOR NEGATIVE 0% POSITIVE =>1% All controls stained appropriately Thressa Sheller MD Pathologist, Electronic Signature ( Signed 10/17/2018) 2. PROGNOSTIC INDICATORS Results: IMMUNOHISTOCHEMICAL AND MORPHOMETRIC ANALYSIS PERFORMED MANUALLY Estrogen Receptor: 70%, POSITIVE, STRONG STAINING INTENSITY Progesterone Receptor: 90%, POSITIVE, STRONG STAINING INTENSITY REFERENCE RANGE ESTROGEN RECEPTOR NEGATIVE 0% 1 of 3 FINAL for Vandergriff, Ashira DOUGLAS ([id number]) ADDITIONAL INFORMATION:(continued) POSITIVE =>1% REFERENCE RANGE PROGESTERONE RECEPTOR NEGATIVE 0% POSITIVE =>1% All controls stained appropriately Thressa Sheller MD Pathologist, Electronic Signature ( Signed 10/17/2018) FINAL DIAGNOSIS Diagnosis 1. Breast, right, needle core biopsy, LOQ, middle depth - DUCTAL CARCINOMA IN SITU, HIGH GRADE WITH CALCIFICATIONS. SEE NOTE. 2. Breast, right, needle core biopsy, LOQ, posterior depth - DUCTAL CARCINOMA IN SITU, INTERMEDIATE TO HIGH GRADE WITH  CALCIFICATIONS. SEE NOTE. Diagnosis Note 2. and 2. Dr. Jeannie Done has reviewed this case and concurs with the above diagnosis. A breast prognostic profile (ER and PR) is pending and will be reported in an addendum. The [facility] was notified on 10/15/2018. Jaquita Folds MD Pathologist, Electronic Signature (Case signed 10/15/2018) Specimen Gross and Clinical Information Specimen Comment 1. In formalin 8:58, extracted 3 minutes ; yes positive of calcifications in same quadrant; recent diagnosis of right breast DCIS, lower, outer quadrant, 2 additional groups of calcifications in same quadrant biopsied for extent of disease 2. In formalin 9:17, extracted 3 minutes for each Specimen(s) Obtained: 1. Breast, right, needle core biopsy, LOQ, middle depth 2. Breast, right, needle core biopsy, LOQ, posterior depth Specimen Clinical Information 1. DCIS,        Calcifications in the right breast were recently biopsied, demonstrating high grade ductal carcinoma in situ. Calcifications in the left breast were biopsied demonstrating fibrocystic changes.  LABS: None provided  EXAM: BILATERAL BREAST MRI WITH AND WITHOUT CONTRAST  TECHNIQUE: Multiplanar, multisequence MR images of both breasts were obtained prior to and following the intravenous administration of 7.5 ml of Gadavist  Three-dimensional MR images were rendered by post-processing of the original MR data on an independent workstation. The three-dimensional MR images were interpreted, and findings are reported in the following complete MRI report for this study. Three dimensional images were evaluated at the independent DynaCad workstation  COMPARISON: Previous exam(s).  FINDINGS: Breast composition: c. Heterogeneous fibroglandular tissue.  Background parenchymal enhancement: Marked  Right breast: The biopsy clip at the site of known DCIS in the lower outer right breast is identified. There  is non mass enhancement located superior, slightly anterior, and posterior to the biopsy clip which corresponds to the site of remaining calcifications identified. This enhancement is difficult to definitively differentiate from the significant background enhancement on today's study. The overall extent of this enhancement is similar to the extent suggested by the remaining calcifications,  spanning nearly 5 cm. The calcifications span up to 4.6 cm.  Left breast: No mass or abnormal enhancement.  Lymph nodes: No abnormal appearing lymph nodes.  Ancillary findings: None.  IMPRESSION: 1. The patient's biopsy clip is noted. Enhancement extending superior, slightly anterior, and posterior to the biopsy clip roughly corresponds to the remaining calcifications. It is difficult to definitively differentiate this enhancement from background enhancement. 2. No evidence of malignancy in the left breast. 3. No axillary adenopathy.  RECOMMENDATION: Recommend stereotactic biopsy of the remaining calcifications seen mammographically in the lower outer right breast. Recommend biopsying the anterior and posterior extent of the remaining unbiopsied calcifications.  BI-RADS CATEGORY 6: Known biopsy-proven malignancy.  The patient is a 43 year old female.   Allergies Sabino Gasser, CMA; 10/20/2018 9:26 AM) Tamiflu *ANTIVIRALS* Seizure Cipro *FLUOROQUINOLONES* Diarrhea, Nausea, Vomiting. Metoclopramide HCl *GASTROINTESTINAL AGENTS - MISC.* "Lock Jaw" per epic note Sulfa Antibiotics Nausea, Vomiting. Allergies Reconciled  Medication History Sabino Gasser, CMA; 10/20/2018 9:26 AM) Christianne Borrow (80MG  Capsule, Oral) Active. Flonase (50MCG/DOSE Inhaler, Nasal) Active. Neurontin (100MG  Capsule, Oral) Active. Mirena (52 MG) (20MCG/24HR IUD, Intrauterine) Active. Multiple Vitamin (1 (one) Oral) Active. PriLOSEC (40MG  Capsule DR, Oral) Active. Valtrex (1GM Tablet, Oral)  Active. Cranberry (200MG  Capsule, Oral) Active. Medications Reconciled    Vitals Sabino Gasser CMA; 10/20/2018 9:27 AM) 10/20/2018 9:26 AM Weight: 153.5 lb Height: 63in Body Surface Area: 1.73 m Body Mass Index: 27.19 kg/m  Temp.: 98.80F(Oral)  Pulse: 97 (Regular)  P.OX: 97% (Room air) BP: 128/72 (Sitting, Left Arm, Standard)      Physical Exam (Jervis Trapani A. Edris Friedt MD; 10/20/2018 11:24 AM)  General Mental Status-Alert. General Appearance-Consistent with stated age. Hydration-Well hydrated. Voice-Normal.  Breast Note: Bruising right breast in the tail Spence. No masses. Left breast normal.  Neurologic Neurologic evaluation reveals -alert and oriented x 3 with no impairment of recent or remote memory. Mental Status-Normal.    Assessment & Plan (Salle Brandle A. Jamie Belger MD; 10/20/2018 11:24 AM)  BREAST NEOPLASM, TIS (DCIS), RIGHT (D05.11) Impression: Multifocal  Discussed breast preservation versus mastectomy. We certainly can usually precedes to localize these areas and due to lumpectomies. I discussed the role of sentinel lymph node mapping in this circumstance as well as potential postoperative treatments in the need for reexcision lumpectomy in these cases as well as potential mastectomy and reconstruction. Risk of lumpectomy include bleeding, infection, seroma, more surgery, use of seed/wire, wound care, cosmetic deformity and the need for other treatments, death , blood clots, death. Pt agrees to proceed.  Current Plans We discussed the staging and pathophysiology of breast cancer. We discussed all of the different options for treatment for breast cancer including surgery, chemotherapy, radiation therapy, Herceptin, and antiestrogen therapy. We discussed a sentinel lymph node biopsy as she does not appear to having lymph node involvement right now. We discussed the performance of that with injection of radioactive tracer and blue dye. We discussed that  she would have an incision underneath her axillary hairline. We discussed that there is a bout a 10-20% chance of having a positive node with a sentinel lymph node biopsy and we will await the permanent pathology to make any other first further decisions in terms of her treatment. One of these options might be to return to the operating room to perform an axillary lymph node dissection. We discussed about a 1-2% risk lifetime of chronic shoulder pain as well as lymphedema associated with a sentinel lymph node biopsy. We discussed the options for treatment of the breast cancer which  included lumpectomy versus a mastectomy. We discussed the performance of the lumpectomy with a wire placement. We discussed a 10-20% chance of a positive margin requiring reexcision in the operating room. We also discussed that she may need radiation therapy or antiestrogen therapy or both if she undergoes lumpectomy. We discussed the mastectomy and the postoperative care for that as well. We discussed that there is no difference in her survival whether she undergoes lumpectomy with radiation therapy or antiestrogen therapy versus a mastectomy. There is a slight difference in the local recurrence rate being 3-5% with lumpectomy and about 1% with a mastectomy. We discussed the risks of operation including bleeding, infection, possible reoperation. She understands her further therapy will be based on what her stages at the time of her operation.  Pt Education - [facility] breast cancer surgery: discussed with patient and provided information. Pt Education - [facility] Breast Biopsy HCI: discussed with patient and provided information. Pt Education - [facility] Breast Pains Education Pt Education - ABC (After Breast Cancer) Class Info: discussed with patient and provided information.

## 2018-10-20 NOTE — Telephone Encounter (Signed)
Received Surgical Pathology Report results from [facility]; forwarded to provider/SLS 03/16

## 2018-10-21 ENCOUNTER — Telehealth: Payer: Self-pay | Admitting: *Deleted

## 2018-10-21 NOTE — Telephone Encounter (Signed)
Received Surgical Pathology Report results from [facility]; forwarded to provider/SLS 03/17

## 2018-10-23 ENCOUNTER — Encounter: Payer: Self-pay | Admitting: Oncology

## 2018-10-29 ENCOUNTER — Other Ambulatory Visit: Payer: Self-pay

## 2018-10-29 ENCOUNTER — Encounter (HOSPITAL_BASED_OUTPATIENT_CLINIC_OR_DEPARTMENT_OTHER): Payer: Self-pay | Admitting: *Deleted

## 2018-10-31 ENCOUNTER — Encounter (HOSPITAL_BASED_OUTPATIENT_CLINIC_OR_DEPARTMENT_OTHER)
Admission: RE | Admit: 2018-10-31 | Discharge: 2018-10-31 | Disposition: A | Discharge status: Home / Self Care | Payer: BLUE CROSS/BLUE SHIELD | Source: Ambulatory Visit | Attending: Surgery | Admitting: Surgery

## 2018-10-31 ENCOUNTER — Other Ambulatory Visit: Payer: Self-pay

## 2018-10-31 DIAGNOSIS — Z01812 Encounter for preprocedural laboratory examination: Secondary | ICD-10-CM | POA: Diagnosis not present

## 2018-10-31 LAB — COMPREHENSIVE METABOLIC PANEL
ALT: 215 U/L — ABNORMAL HIGH (ref 0–44)
AST: 246 U/L — ABNORMAL HIGH (ref 15–41)
Albumin: 4.5 g/dL (ref 3.5–5.0)
Alkaline Phosphatase: 30 U/L — ABNORMAL LOW (ref 38–126)
Anion gap: 14 (ref 5–15)
BUN: 9 mg/dL (ref 6–20)
CO2: 23 mmol/L (ref 22–32)
Calcium: 9.1 mg/dL (ref 8.9–10.3)
Chloride: 98 mmol/L (ref 98–111)
Creatinine, Ser: 0.64 mg/dL (ref 0.44–1.00)
GFR calc non Af Amer: >60 mL/min (ref >60)
Glucose, Bld: 116 mg/dL — ABNORMAL HIGH (ref 70–99)
Potassium: 3.3 mmol/L — ABNORMAL LOW (ref 3.5–5.1)
Sodium: 135 mmol/L (ref 135–145)
Total Bilirubin: 0.9 mg/dL (ref 0.3–1.2)
Total Protein: 7.9 g/dL (ref 6.5–8.1)

## 2018-10-31 LAB — CBC WITH DIFFERENTIAL/PLATELET
Abs Immature Granulocytes: 0.01 10*3/uL (ref 0.00–0.07)
Basophils Absolute: 0 10*3/uL (ref 0.0–0.1)
Basophils Relative: 0 %
Eosinophils Absolute: 0.1 10*3/uL (ref 0.0–0.5)
Eosinophils Relative: 1 %
HCT: 37.8 % (ref 36.0–46.0)
Hemoglobin: 12.9 g/dL (ref 12.0–15.0)
Immature Granulocytes: 0 %
Lymphocytes Relative: 25 %
Lymphs Abs: 1.2 10*3/uL (ref 0.7–4.0)
MCH: 32.1 pg (ref 26.0–34.0)
MCHC: 34.1 g/dL (ref 30.0–36.0)
MCV: 94 fL (ref 80.0–100.0)
MONO ABS: 0.7 10*3/uL (ref 0.1–1.0)
Monocytes Relative: 13 %
NEUTROS ABS: 3 10*3/uL (ref 1.7–7.7)
Neutrophils Relative %: 61 %
Platelets: 184 10*3/uL (ref 150–400)
RBC: 4.02 MIL/uL (ref 3.87–5.11)
RDW: 12.6 % (ref 11.5–15.5)
WBC: 5 10*3/uL (ref 4.0–10.5)
nRBC: 0 % (ref 0.0–0.2)

## 2018-10-31 NOTE — Progress Notes (Signed)
Ensure pre surgery drink given with instructions to complete by 0430 dos, pt verbalized understanding. 

## 2018-11-03 NOTE — Progress Notes (Addendum)
I spoke with Dr. Linna Caprice, anesthesiology, who advised that Dr. Brantley Stage needed to be updated with lab results.  I was able to get Dr. Brantley Stage on the line and Dr. Linna Caprice spoke with him.  Dr. Linna Caprice advised that the surgery will be rescheduled out a week.  Spoke with Hassan Rowan, scheduler, in Dr. Irven Baltimore office who advised she will follow up with Dr. Brantley Stage and reschedule as he would like to proceed.  I did advise that patient was due to go have seed placed tomorrow and she advised that they would get right on it so that patient could reschedule.

## 2018-11-04 ENCOUNTER — Inpatient Hospital Stay: Admission: RE | Admit: 2018-11-04 | Payer: BLUE CROSS/BLUE SHIELD | Source: Ambulatory Visit

## 2018-11-04 ENCOUNTER — Other Ambulatory Visit: Payer: Self-pay | Admitting: Family Medicine

## 2018-11-04 ENCOUNTER — Other Ambulatory Visit: Payer: BLUE CROSS/BLUE SHIELD

## 2018-11-05 ENCOUNTER — Telehealth: Payer: Self-pay | Admitting: Family Medicine

## 2018-11-05 NOTE — Telephone Encounter (Signed)
Look like labs were done at hospital and are in epic. Can you please review?

## 2018-11-05 NOTE — Telephone Encounter (Signed)
Copied from [place] [id number]. Topic: Quick Communication - See Telephone Encounter >> Nov 05, 2018 10:23 AM Richardo Priest, NT wrote: CRM for notification. See Telephone encounter for: 11/05/18. Sharyn Lull called from [facility] requesting Dr.Lowne Chase review labs of patient taken 3/27. Anaesthesilogy needing clearance before surgery can be rescheduled due to liver enzymes being high. Call back number is [phone].

## 2018-11-05 NOTE — Telephone Encounter (Signed)
I have not seen her in over a year--- she has hx fatty liver  I would need to see her first

## 2018-11-06 ENCOUNTER — Encounter: Payer: Self-pay | Admitting: Family Medicine

## 2018-11-06 ENCOUNTER — Other Ambulatory Visit: Payer: Self-pay

## 2018-11-06 ENCOUNTER — Ambulatory Visit (INDEPENDENT_AMBULATORY_CARE_PROVIDER_SITE_OTHER): Payer: BLUE CROSS/BLUE SHIELD | Admitting: Family Medicine

## 2018-11-06 ENCOUNTER — Telehealth: Payer: Self-pay | Admitting: Gastroenterology

## 2018-11-06 DIAGNOSIS — R7989 Other specified abnormal findings of blood chemistry: Secondary | ICD-10-CM

## 2018-11-06 DIAGNOSIS — D0511 Intraductal carcinoma in situ of right breast: Secondary | ICD-10-CM | POA: Diagnosis not present

## 2018-11-06 DIAGNOSIS — C50919 Malignant neoplasm of unspecified site of unspecified female breast: Secondary | ICD-10-CM | POA: Insufficient documentation

## 2018-11-06 DIAGNOSIS — R945 Abnormal results of liver function studies: Secondary | ICD-10-CM

## 2018-11-06 DIAGNOSIS — R748 Abnormal levels of other serum enzymes: Secondary | ICD-10-CM | POA: Diagnosis not present

## 2018-11-06 MED ORDER — GABAPENTIN 100 MG PO CAPS: 100.0000 mg | ORAL_CAPSULE | Freq: Three times a day (TID) | ORAL | 1 refills | Status: DC

## 2018-11-06 NOTE — Assessment & Plan Note (Signed)
Per oncology--surgery Lumpectomy schduled 4/16

## 2018-11-06 NOTE — Telephone Encounter (Signed)
Liver function is almost double what it was when we saw her  We can do a web ex to discuss and then she may need to see GI

## 2018-11-06 NOTE — Assessment & Plan Note (Addendum)
Recheck next week Stop alcohol and otc vitamins  Will check ct scan and try to get app with gi quickly

## 2018-11-06 NOTE — Telephone Encounter (Signed)
Are you ok with webex since it is a surgical clearance?

## 2018-11-06 NOTE — Telephone Encounter (Signed)
Appointment made for today at 330p

## 2018-11-06 NOTE — Progress Notes (Signed)
Virtual Visit via Video Note  I connected with [name] on 11/06/18 at  3:30 PM EDT by a video enabled telemedicine application and verified that I am speaking with the correct person using two identifiers.   I discussed the limitations of evaluation and management by telemedicine and the availability of in person appointments. The patient expressed understanding and agreed to proceed.  History of Present Illness: Pt is home needing clearance for breast surgery due to elevated liver enzymes   Enzymes more than doubled since last month.  Pt admits to drinking more alcohol due to stress of her diagnosis.   No tylenol, no abd pain , no nvd.     Observations/Objective: Pt in NAD Afebrile    Assessment and Plan: 1. Elevated liver enzymes Recheck next week Stop alcohol and otc vitamins  Will check ct scan and try to get app with gi quickly     - Ambulatory referral to Gastroenterology - CT Abdomen Pelvis W Contrast; Future - Comprehensive metabolic panel; Future  2. Ductal carcinoma in situ (DCIS) of right breast Per oncology--surgery Lumpectomy schduled 4/16      Follow Up Instructions:    I discussed the assessment and treatment plan with the patient. The patient was provided an opportunity to ask questions and all were answered. The patient agreed with the plan and demonstrated an understanding of the instructions.   The patient was advised to call back or seek an in-person evaluation if the symptoms worsen or if the condition fails to improve as anticipated.  I provided 25 minutes of non-face-to-face time during this encounter.-->50% time spent discussing liver enzymes , possible causes and tx plan   Ann Held, DO

## 2018-11-06 NOTE — Telephone Encounter (Signed)
[name] how soon can we get her on for a virtual visit?

## 2018-11-06 NOTE — Telephone Encounter (Signed)
Please advise as to scheduling if you prefer regular OV or virtual visit?  Thank you, Lorriane Shire

## 2018-11-07 ENCOUNTER — Telehealth: Payer: Self-pay | Admitting: *Deleted

## 2018-11-07 NOTE — Telephone Encounter (Signed)
Received Medical/Surgical Clearance Request Form from [facility]; forwarded to provider/SLS 04/03

## 2018-11-10 ENCOUNTER — Other Ambulatory Visit: Payer: Self-pay

## 2018-11-10 NOTE — Telephone Encounter (Signed)
Please schedule her for virtual visit on Wednesday November 12, 2018. Please check if she can do labs today.  I will order the labs in epic, please check which lab patient prefers.  Thank you

## 2018-11-10 NOTE — Telephone Encounter (Signed)
Ok thanks 

## 2018-11-10 NOTE — Telephone Encounter (Signed)
Please review and advise.

## 2018-11-10 NOTE — Telephone Encounter (Signed)
Spoke with the patient. She cannot come in for labs today. She agrees to come tomorrow to the [facility] Location for her labs. Agrees to a virtual appointment on 11/12/18 "anytime."  She would like the information sent to her through her patient portal in My Chart.

## 2018-11-10 NOTE — Telephone Encounter (Signed)
Dr.Nandigam's next available is 11/19/2018 please advise if we can schedule with an APP or maybe another provider sooner?  Thank you, Lorriane Shire

## 2018-11-11 ENCOUNTER — Other Ambulatory Visit: Payer: Self-pay

## 2018-11-11 ENCOUNTER — Encounter (HOSPITAL_BASED_OUTPATIENT_CLINIC_OR_DEPARTMENT_OTHER)
Admission: RE | Admit: 2018-11-11 | Discharge: 2018-11-11 | Disposition: A | Discharge status: Home / Self Care | Payer: BLUE CROSS/BLUE SHIELD | Source: Ambulatory Visit | Attending: Surgery | Admitting: Surgery

## 2018-11-11 DIAGNOSIS — Z01812 Encounter for preprocedural laboratory examination: Secondary | ICD-10-CM | POA: Diagnosis not present

## 2018-11-11 LAB — COMPREHENSIVE METABOLIC PANEL
ALT: 155 U/L — ABNORMAL HIGH (ref 0–44)
AST: 130 U/L — ABNORMAL HIGH (ref 15–41)
Albumin: 4.5 g/dL (ref 3.5–5.0)
Alkaline Phosphatase: 32 U/L — ABNORMAL LOW (ref 38–126)
Anion gap: 11 (ref 5–15)
BUN: 7 mg/dL (ref 6–20)
CO2: 28 mmol/L (ref 22–32)
Calcium: 9.9 mg/dL (ref 8.9–10.3)
Chloride: 99 mmol/L (ref 98–111)
Creatinine, Ser: 0.6 mg/dL (ref 0.44–1.00)
GFR calc Af Amer: >60 mL/min (ref >60)
GFR calc non Af Amer: >60 mL/min (ref >60)
Glucose, Bld: 131 mg/dL — ABNORMAL HIGH (ref 70–99)
Potassium: 3.5 mmol/L (ref 3.5–5.1)
Sodium: 138 mmol/L (ref 135–145)
Total Bilirubin: 0.9 mg/dL (ref 0.3–1.2)
Total Protein: 8 g/dL (ref 6.5–8.1)

## 2018-11-12 ENCOUNTER — Encounter: Payer: Self-pay | Admitting: Gastroenterology

## 2018-11-12 ENCOUNTER — Ambulatory Visit (INDEPENDENT_AMBULATORY_CARE_PROVIDER_SITE_OTHER): Payer: BLUE CROSS/BLUE SHIELD | Admitting: Gastroenterology

## 2018-11-12 DIAGNOSIS — K2270 Barrett's esophagus without dysplasia: Secondary | ICD-10-CM

## 2018-11-12 DIAGNOSIS — R7989 Other specified abnormal findings of blood chemistry: Secondary | ICD-10-CM

## 2018-11-12 DIAGNOSIS — K219 Gastro-esophageal reflux disease without esophagitis: Secondary | ICD-10-CM | POA: Diagnosis not present

## 2018-11-12 DIAGNOSIS — R945 Abnormal results of liver function studies: Secondary | ICD-10-CM

## 2018-11-12 NOTE — Progress Notes (Signed)
[name]    [id number]    [birth year]  Primary Care Physician:Lowne Chase, Alferd Apa, DO  Referring Physician: Carollee Herter, Alferd Apa, DO [address] [place], [place] [address]  This service was provided via telemedicine due to [place] 19.  Patient location: Home Provider location: Office Used 2 patient identifiers to confirm the correct person. Explained the limitations in evaluation and management via telemedicine. Patient is aware of potential medical charges for this visit.  Patient consented to this virtual visit (via telephone/webex).  The persons participating in this telemedicine service were myself and the patient    Chief complaint: LFT abnormality  HPI: 43 year old female with history of chronic GERD, Barrett's esophagus with no dysplasia, recent diagnosis of breast cancer with abnormal LFT Is planning to undergo lumpectomy next week.  Hepatic Function Latest Ref Rng & Units 11/11/2018 10/31/2018 09/24/2018  Total Protein 6.5 - 8.1 g/dL 8.0 7.9 8.3(H)  Albumin 3.5 - 5.0 g/dL 4.5 4.5 4.4  AST 15 - 41 U/L 130(H) 246(H) 101(H)  ALT 0 - 44 U/L 155(H) 215(H) 102(H)  Alk Phosphatase 38 - 126 U/L 32(L) 30(L) 38  Total Bilirubin 0.3 - 1.2 mg/dL 0.9 0.9 0.5   Prior to February 2020 she did have elevated AST and ALT in the 100-200s in 2018  She started taking a new multivitamin, with a counter herbal remedy (Neutra burst) in the past few months  Denies any other recent medication changes or antibiotics  She decreased gabapentin 200 mg daily.  Has been taking Strattera for past 2 years.  She drinks a glass of wine daily  Denies frequent use of NSAIDs   Outpatient Encounter Medications as of 11/12/2018  Medication Sig  . atomoxetine (STRATTERA) 40 MG capsule TAKE 1 CAPSULE BY MOUTH TWICE A DAY WITH A MEAL  . ELDERBERRY PO Take by mouth.  . gabapentin (NEURONTIN) 100 MG capsule Take 1 capsule (100 mg total) by mouth 3 (three) times  daily.  Marland Kitchen levonorgestrel (MIRENA) 20 MCG/24HR IUD 1 each by Intrauterine route once.  Marland Kitchen omeprazole (PRILOSEC) 40 MG capsule TAKE 1 CAPSULE BY MOUTH TWICE A DAY  . potassium chloride (MICRO-K) 10 MEQ CR capsule Take 10 mEq by mouth 2 (two) times daily.  . valACYclovir (VALTREX) 1000 MG tablet Take 1 tablet (1,000 mg total) by mouth 3 (three) times daily as needed. For flare ups  . [DISCONTINUED] Norgestim-Eth Radene Journey Triphasic (ORTHO TRI-CYCLEN, 28, PO) Take 0.035 mg by mouth daily.    No facility-administered encounter medications on file as of 11/12/2018.     Allergies as of 11/12/2018 - Review Complete 11/06/2018  Allergen Reaction Noted  . Tamiflu [oseltamivir phosphate] Other (See Comments) 11/19/2013  . Ciprofloxacin Diarrhea and Nausea And Vomiting 09/04/2011  . Metoclopramide hcl Other (See Comments) 10/08/2007  . Sulfa antibiotics Nausea And Vomiting 10/30/2011    Past Medical History:  Diagnosis Date  . Anemia   . Back pain   . Bimalleolar ankle fracture    left  . Family history of breast cancer   . GERD (gastroesophageal reflux disease)   . Hypertension    no meds  . Kidney infection    1998- hosp.- [facility]    Past Surgical History:  Procedure Laterality Date  . ANTERIOR LUMBAR FUSION  11/14/2011   Procedure: ANTERIOR LUMBAR FUSION 2 LEVELS;  Surgeon: Johnn Hai, MD;  Location: [facility];  Service: Orthopedics;  Laterality: N/A;  ALIF L5-S1  . BACK SURGERY    . CESAREAN SECTION     2007- /w epidural  anesthesia   . COLONOSCOPY    . LUMBAR LAMINECTOMY/DECOMPRESSION MICRODISCECTOMY Right 12/15/2015   Procedure:  MICO LUMBER DECOMPRESSION L4-5 ON THE RIGHT, ;  Surgeon: Susa Day, MD;  Location: [facility];  Service: Orthopedics;  Laterality: Right;  . ORIF ANKLE FRACTURE Left 10/03/2017   Procedure: OPEN REDUCTION INTERNAL FIXATION (ORIF) ANKLE BIMALLEOLAR FRACTURE; possible deltoid ligament repair;  Surgeon: Wylene Simmer, MD;  Location: [facility];  Service:  Orthopedics;  Laterality: Left;  . SPINE SURGERY     fusion  . UPPER GASTROINTESTINAL ENDOSCOPY    . WISDOM TOOTH EXTRACTION     young adult    Family History  Problem Relation Age of Onset  . Lupus Mother   . Breast cancer Maternal Aunt   . Cancer Maternal Aunt   . Anesthesia problems Neg Hx   . Colon cancer Neg Hx   . Colon polyps Neg Hx   . Esophageal cancer Neg Hx   . Rectal cancer Neg Hx   . Stomach cancer Neg Hx     Social History   Socioeconomic History  . Marital status: Married    Spouse name: Not on file  . Number of children: Not on file  . Years of education: Not on file  . Highest education level: Not on file  Occupational History  . Not on file  Social Needs  . Financial resource strain: Not on file  . Food insecurity:    Worry: Not on file    Inability: Not on file  . Transportation needs:    Medical: Not on file    Non-medical: Not on file  Tobacco Use  . Smoking status: Former Smoker    Packs/day: 0.25    Years: 15.00    Pack years: 3.75    Types: E-cigarettes, Cigarettes    Last attempt to quit: 04/01/2017    Years since quitting: 1.6  . Smokeless tobacco: Never Used  . Tobacco comment: vapes daily  Substance and Sexual Activity  . Alcohol use: Yes    Alcohol/week: 0.0 standard drinks    Comment: a glass of wine  on occas.   . Drug use: No  . Sexual activity: Yes    Birth control/protection: I.U.D.  Lifestyle  . Physical activity:    Days per week: Not on file    Minutes per session: Not on file  . Stress: Not on file  Relationships  . Social connections:    Talks on phone: Not on file    Gets together: Not on file    Attends religious service: Not on file    Active member of club or organization: Not on file    Attends meetings of clubs or organizations: Not on file    Relationship status: Not on file  . Intimate partner violence:    Fear of current or ex partner: Not on file    Emotionally abused: Not on file    Physically  abused: Not on file    Forced sexual activity: Not on file  Other Topics Concern  . Not on file  Social History Narrative  . Not on file      Review of systems: Review of Systems as per HPI All other systems reviewed and are negative.   Physical Exam: Vitals were not taken and physical exam was not performed during this virtual visit.  Data Reviewed:  Reviewed labs, radiology imaging, old records and pertinent past GI work up   Assessment and Plan/Recommendations:  43 year old female history of chronic GERD, long segment Barrett's esophagus, recent diagnosis of breast cancer with elevated transaminases  Abnormal LFT likely secondary to subacute liver injury due to medications/herbal supplements  AST and ALT trended down to 100's on most recent labs from yesterday  Avoid any herbal supplements or over-the-counter medications  Discussed alcohol cessation  We will plan to taper gabapentin off  Limit use of NSAIDs  Recheck LFT in 2 weeks  Await lab results for hepatitis A (IgM and IgG), Hepatitis B surface antigen, Hepatitis B surface antibody, Hepatitis C antibody,  ferritin, ANA, AMA,  anti smooth muscle antibody, alpha 1 antitrypsin, cerulloplasm, TTG, total IgA level, PT/ INR.  No evidence of cirrhosis or portal hypertension based on labs, if INR remains low within normal range okay to proceed with surgery as planned next week.  GERD and Barrett's esophagus: Continue PPI and antireflux measures  Return for office visit in 4 to 6 weeks    K. Denzil Magnuson , MD   CC: Carollee Herter, Alferd Apa, *

## 2018-11-12 NOTE — Patient Instructions (Addendum)
Recheck LFT in 2 weeks (week of April 20th)  Taper off Gabapentin, take it every other day for a week and then twice weekly for 1 week and stop.   Avoid OTC herbal remedies  Avoid alcohol  Limit use of NSAID's  Ok to use up to 2 gm of Tylenol per day  Your provider has requested that you go to the basement level for lab work before leaving today. Press "B" on the elevator. The lab is located at the first door on the left as you exit the elevator.   Follow-up virtual office visit in 4 to 6 weeks.  Thank you for choosing me and [facility].  Dr. Silverio Decamp

## 2018-11-17 ENCOUNTER — Other Ambulatory Visit: Payer: BLUE CROSS/BLUE SHIELD

## 2018-11-19 ENCOUNTER — Other Ambulatory Visit: Payer: BLUE CROSS/BLUE SHIELD

## 2018-11-20 ENCOUNTER — Ambulatory Visit: Payer: BLUE CROSS/BLUE SHIELD | Admitting: Radiation Oncology

## 2018-11-20 ENCOUNTER — Ambulatory Visit: Payer: BLUE CROSS/BLUE SHIELD

## 2018-12-11 ENCOUNTER — Ambulatory Visit: Payer: Self-pay | Admitting: Surgery

## 2018-12-11 DIAGNOSIS — D0511 Intraductal carcinoma in situ of right breast: Secondary | ICD-10-CM

## 2018-12-24 ENCOUNTER — Telehealth: Payer: Self-pay | Admitting: Radiation Oncology

## 2018-12-24 NOTE — Telephone Encounter (Signed)
The patient was on our schedule tomorrow for follow up. She was unaware of the appointment and hasn't had surgery since we had our last discussion. I let her know we'd plan to see her a few weeks after her surgery to discuss moving forward with radiation. She is in agreement.

## 2018-12-25 ENCOUNTER — Ambulatory Visit: Payer: BLUE CROSS/BLUE SHIELD | Admitting: Radiation Oncology

## 2018-12-25 ENCOUNTER — Ambulatory Visit: Payer: BC Managed Care – PPO

## 2018-12-25 ENCOUNTER — Ambulatory Visit
Admission: RE | Admit: 2018-12-25 | Discharge: 2018-12-25 | Disposition: A | Discharge status: Home / Self Care | Payer: BC Managed Care – PPO | Source: Ambulatory Visit | Attending: Radiation Oncology | Admitting: Radiation Oncology

## 2018-12-25 NOTE — Telephone Encounter (Signed)
Thanks for the update. We were unaware her surgery was r/s. We will request a new appt for her to be seen a couple weeks after sx.

## 2019-01-05 HISTORY — PX: BREAST LUMPECTOMY: SHX2

## 2019-01-06 ENCOUNTER — Other Ambulatory Visit: Payer: Self-pay

## 2019-01-09 ENCOUNTER — Other Ambulatory Visit: Payer: Self-pay

## 2019-01-09 ENCOUNTER — Other Ambulatory Visit (HOSPITAL_COMMUNITY)
Admission: RE | Admit: 2019-01-09 | Discharge: 2019-01-09 | Disposition: A | Discharge status: Home / Self Care | Payer: BC Managed Care – PPO | Source: Ambulatory Visit | Attending: Surgery | Admitting: Surgery

## 2019-01-09 ENCOUNTER — Encounter (HOSPITAL_BASED_OUTPATIENT_CLINIC_OR_DEPARTMENT_OTHER)
Admission: RE | Admit: 2019-01-09 | Discharge: 2019-01-09 | Disposition: A | Discharge status: Home / Self Care | Payer: BC Managed Care – PPO | Source: Ambulatory Visit | Attending: Surgery | Admitting: Surgery

## 2019-01-09 DIAGNOSIS — Z1159 Encounter for screening for other viral diseases: Secondary | ICD-10-CM | POA: Diagnosis not present

## 2019-01-09 DIAGNOSIS — Z01812 Encounter for preprocedural laboratory examination: Secondary | ICD-10-CM | POA: Diagnosis not present

## 2019-01-09 DIAGNOSIS — D0511 Intraductal carcinoma in situ of right breast: Secondary | ICD-10-CM | POA: Diagnosis not present

## 2019-01-09 LAB — COMPREHENSIVE METABOLIC PANEL
ALT: 150 U/L — ABNORMAL HIGH (ref 0–44)
AST: 157 U/L — ABNORMAL HIGH (ref 15–41)
Albumin: 4.7 g/dL (ref 3.5–5.0)
Alkaline Phosphatase: 39 U/L (ref 38–126)
Anion gap: 13 (ref 5–15)
BUN: 7 mg/dL (ref 6–20)
CO2: 24 mmol/L (ref 22–32)
Calcium: 9.6 mg/dL (ref 8.9–10.3)
Chloride: 99 mmol/L (ref 98–111)
Creatinine, Ser: 0.74 mg/dL (ref 0.44–1.00)
GFR calc Af Amer: >60 mL/min (ref >60)
GFR calc non Af Amer: >60 mL/min (ref >60)
Glucose, Bld: 134 mg/dL — ABNORMAL HIGH (ref 70–99)
Potassium: 3.5 mmol/L (ref 3.5–5.1)
Sodium: 136 mmol/L (ref 135–145)
Total Bilirubin: 1.1 mg/dL (ref 0.3–1.2)
Total Protein: 8.2 g/dL — ABNORMAL HIGH (ref 6.5–8.1)

## 2019-01-09 LAB — CBC WITH DIFFERENTIAL/PLATELET
Abs Immature Granulocytes: 0.02 10*3/uL (ref 0.00–0.07)
Basophils Absolute: 0 10*3/uL (ref 0.0–0.1)
Basophils Relative: 0 %
Eosinophils Absolute: 0 10*3/uL (ref 0.0–0.5)
Eosinophils Relative: 1 %
HCT: 41 % (ref 36.0–46.0)
Hemoglobin: 13.9 g/dL (ref 12.0–15.0)
Immature Granulocytes: 0 %
Lymphocytes Relative: 26 %
Lymphs Abs: 1.3 10*3/uL (ref 0.7–4.0)
MCH: 31.4 pg (ref 26.0–34.0)
MCHC: 33.9 g/dL (ref 30.0–36.0)
MCV: 92.6 fL (ref 80.0–100.0)
Monocytes Absolute: 0.5 10*3/uL (ref 0.1–1.0)
Monocytes Relative: 10 %
Neutro Abs: 3 10*3/uL (ref 1.7–7.7)
Neutrophils Relative %: 63 %
Platelets: 182 10*3/uL (ref 150–400)
RBC: 4.43 MIL/uL (ref 3.87–5.11)
RDW: 12.4 % (ref 11.5–15.5)
WBC: 4.8 10*3/uL (ref 4.0–10.5)
nRBC: 0 % (ref 0.0–0.2)

## 2019-01-09 LAB — SARS CORONAVIRUS 2 BY RT PCR (HOSPITAL ORDER, PERFORMED IN ~~LOC~~ HOSPITAL LAB): SARS Coronavirus 2: NEGATIVE

## 2019-01-12 ENCOUNTER — Ambulatory Visit
Admission: RE | Admit: 2019-01-12 | Discharge: 2019-01-12 | Disposition: A | Discharge status: Home / Self Care | Payer: BC Managed Care – PPO | Source: Ambulatory Visit | Attending: Surgery | Admitting: Surgery

## 2019-01-12 ENCOUNTER — Other Ambulatory Visit: Payer: Self-pay

## 2019-01-12 DIAGNOSIS — D0511 Intraductal carcinoma in situ of right breast: Secondary | ICD-10-CM

## 2019-01-13 ENCOUNTER — Encounter (HOSPITAL_BASED_OUTPATIENT_CLINIC_OR_DEPARTMENT_OTHER)
Admission: RE | Disposition: A | Discharge status: Home / Self Care | Payer: Self-pay | Source: Home / Self Care | Attending: Surgery

## 2019-01-13 ENCOUNTER — Encounter (HOSPITAL_BASED_OUTPATIENT_CLINIC_OR_DEPARTMENT_OTHER): Payer: Self-pay | Admitting: Certified Registered"

## 2019-01-13 ENCOUNTER — Ambulatory Visit (HOSPITAL_BASED_OUTPATIENT_CLINIC_OR_DEPARTMENT_OTHER): Payer: BC Managed Care – PPO | Admitting: Certified Registered"

## 2019-01-13 ENCOUNTER — Ambulatory Visit (HOSPITAL_BASED_OUTPATIENT_CLINIC_OR_DEPARTMENT_OTHER)
Admission: RE | Admit: 2019-01-13 | Discharge: 2019-01-13 | Disposition: A | Discharge status: Home / Self Care | Payer: BC Managed Care – PPO | Attending: Surgery | Admitting: Surgery

## 2019-01-13 ENCOUNTER — Ambulatory Visit
Admission: RE | Admit: 2019-01-13 | Discharge: 2019-01-13 | Disposition: A | Discharge status: Home / Self Care | Payer: BLUE CROSS/BLUE SHIELD | Source: Ambulatory Visit | Attending: Surgery | Admitting: Surgery

## 2019-01-13 DIAGNOSIS — D0511 Intraductal carcinoma in situ of right breast: Secondary | ICD-10-CM

## 2019-01-13 DIAGNOSIS — Z79899 Other long term (current) drug therapy: Secondary | ICD-10-CM | POA: Diagnosis not present

## 2019-01-13 DIAGNOSIS — I10 Essential (primary) hypertension: Secondary | ICD-10-CM | POA: Diagnosis not present

## 2019-01-13 DIAGNOSIS — Z87891 Personal history of nicotine dependence: Secondary | ICD-10-CM | POA: Insufficient documentation

## 2019-01-13 DIAGNOSIS — Z881 Allergy status to other antibiotic agents status: Secondary | ICD-10-CM | POA: Insufficient documentation

## 2019-01-13 DIAGNOSIS — D0591 Unspecified type of carcinoma in situ of right breast: Secondary | ICD-10-CM | POA: Diagnosis not present

## 2019-01-13 DIAGNOSIS — Z882 Allergy status to sulfonamides status: Secondary | ICD-10-CM | POA: Diagnosis not present

## 2019-01-13 DIAGNOSIS — K219 Gastro-esophageal reflux disease without esophagitis: Secondary | ICD-10-CM | POA: Diagnosis not present

## 2019-01-13 DIAGNOSIS — Z975 Presence of (intrauterine) contraceptive device: Secondary | ICD-10-CM | POA: Diagnosis not present

## 2019-01-13 DIAGNOSIS — K5900 Constipation, unspecified: Secondary | ICD-10-CM | POA: Diagnosis not present

## 2019-01-13 HISTORY — PX: BREAST LUMPECTOMY WITH RADIOACTIVE SEED LOCALIZATION: SHX6424

## 2019-01-13 SURGERY — BREAST LUMPECTOMY WITH RADIOACTIVE SEED LOCALIZATION
Anesthesia: General | Site: Breast | Laterality: Right

## 2019-01-13 MED ORDER — CHLORHEXIDINE GLUCONATE CLOTH 2 % EX PADS: 6.0000 | MEDICATED_PAD | Freq: Once | CUTANEOUS | Status: DC

## 2019-01-13 MED ORDER — SCOPOLAMINE 1 MG/3DAYS TD PT72: 1.0000 | MEDICATED_PATCH | Freq: Once | TRANSDERMAL | Status: DC | PRN

## 2019-01-13 MED ORDER — DEXTROSE 5 % IV SOLN: 3.0000 g | INTRAVENOUS | Status: DC

## 2019-01-13 MED ORDER — CEFAZOLIN SODIUM-DEXTROSE 2-4 GM/100ML-% IV SOLN
INTRAVENOUS | Status: AC
  Filled 2019-01-13: qty 100

## 2019-01-13 MED ORDER — FENTANYL CITRATE (PF) 100 MCG/2ML IJ SOLN
25.0000 ug | INTRAMUSCULAR | Status: DC | PRN
  Administered 2019-01-13 (×2): 50 ug via INTRAVENOUS

## 2019-01-13 MED ORDER — MIDAZOLAM HCL 2 MG/2ML IJ SOLN: 1.0000 mg | INTRAMUSCULAR | Status: DC | PRN

## 2019-01-13 MED ORDER — BUPIVACAINE HCL (PF) 0.25 % IJ SOLN
INTRAMUSCULAR | Status: AC
  Filled 2019-01-13: qty 30

## 2019-01-13 MED ORDER — PROPOFOL 10 MG/ML IV BOLUS
INTRAVENOUS | Status: DC | PRN
  Administered 2019-01-13: 200 mg via INTRAVENOUS

## 2019-01-13 MED ORDER — BUPIVACAINE-EPINEPHRINE (PF) 0.25% -1:200000 IJ SOLN
INTRAMUSCULAR | Status: AC
  Filled 2019-01-13: qty 30

## 2019-01-13 MED ORDER — ACETAMINOPHEN 500 MG PO TABS
1000.0000 mg | ORAL_TABLET | ORAL | Status: AC
  Administered 2019-01-13: 1000 mg via ORAL

## 2019-01-13 MED ORDER — DEXAMETHASONE SODIUM PHOSPHATE 4 MG/ML IJ SOLN
INTRAMUSCULAR | Status: DC | PRN
  Administered 2019-01-13: 10 mg via INTRAVENOUS

## 2019-01-13 MED ORDER — HYDROCODONE-ACETAMINOPHEN 5-325 MG PO TABS: 1.0000 | ORAL_TABLET | Freq: Four times a day (QID) | ORAL | 0 refills | Status: DC | PRN

## 2019-01-13 MED ORDER — PROPOFOL 500 MG/50ML IV EMUL
INTRAVENOUS | Status: DC | PRN
  Administered 2019-01-13: 25 ug/kg/min via INTRAVENOUS

## 2019-01-13 MED ORDER — ONDANSETRON HCL 4 MG/2ML IJ SOLN: 4.0000 mg | Freq: Once | INTRAMUSCULAR | Status: DC | PRN

## 2019-01-13 MED ORDER — FENTANYL CITRATE (PF) 100 MCG/2ML IJ SOLN
INTRAMUSCULAR | Status: AC
  Filled 2019-01-13: qty 2

## 2019-01-13 MED ORDER — GABAPENTIN 300 MG PO CAPS: 300.0000 mg | ORAL_CAPSULE | ORAL | Status: DC

## 2019-01-13 MED ORDER — IBUPROFEN 800 MG PO TABS: 800.0000 mg | ORAL_TABLET | Freq: Three times a day (TID) | ORAL | 0 refills | Status: DC | PRN

## 2019-01-13 MED ORDER — GABAPENTIN 300 MG PO CAPS
300.0000 mg | ORAL_CAPSULE | ORAL | Status: AC
  Administered 2019-01-13: 300 mg via ORAL

## 2019-01-13 MED ORDER — LIDOCAINE HCL (CARDIAC) PF 100 MG/5ML IV SOSY
PREFILLED_SYRINGE | INTRAVENOUS | Status: DC | PRN
  Administered 2019-01-13: 60 mg via INTRAVENOUS

## 2019-01-13 MED ORDER — ONDANSETRON HCL 4 MG/2ML IJ SOLN
INTRAMUSCULAR | Status: DC | PRN
  Administered 2019-01-13: 4 mg via INTRAVENOUS

## 2019-01-13 MED ORDER — BUPIVACAINE-EPINEPHRINE (PF) 0.25% -1:200000 IJ SOLN
INTRAMUSCULAR | Status: DC | PRN
  Administered 2019-01-13: 30 mL

## 2019-01-13 MED ORDER — ACETAMINOPHEN 500 MG PO TABS: 1000.0000 mg | ORAL_TABLET | ORAL | Status: DC

## 2019-01-13 MED ORDER — FENTANYL CITRATE (PF) 100 MCG/2ML IJ SOLN
50.0000 ug | INTRAMUSCULAR | Status: AC | PRN
  Administered 2019-01-13 (×2): 25 ug via INTRAVENOUS
  Administered 2019-01-13: 50 ug via INTRAVENOUS

## 2019-01-13 MED ORDER — OXYCODONE HCL 5 MG/5ML PO SOLN: 5.0000 mg | Freq: Once | ORAL | Status: AC | PRN

## 2019-01-13 MED ORDER — GABAPENTIN 300 MG PO CAPS
ORAL_CAPSULE | ORAL | Status: AC
  Filled 2019-01-13: qty 1

## 2019-01-13 MED ORDER — OXYCODONE HCL 5 MG PO TABS
ORAL_TABLET | ORAL | Status: AC
  Filled 2019-01-13: qty 1

## 2019-01-13 MED ORDER — ACETAMINOPHEN 500 MG PO TABS
ORAL_TABLET | ORAL | Status: AC
  Filled 2019-01-13: qty 2

## 2019-01-13 MED ORDER — LACTATED RINGERS IV SOLN
INTRAVENOUS | Status: DC
  Administered 2019-01-13: 07:00:00 via INTRAVENOUS

## 2019-01-13 MED ORDER — MIDAZOLAM HCL 2 MG/2ML IJ SOLN
INTRAMUSCULAR | Status: AC
  Filled 2019-01-13: qty 2

## 2019-01-13 MED ORDER — OXYCODONE HCL 5 MG PO TABS
5.0000 mg | ORAL_TABLET | Freq: Once | ORAL | Status: AC | PRN
  Administered 2019-01-13: 5 mg via ORAL

## 2019-01-13 MED ORDER — CEFAZOLIN SODIUM-DEXTROSE 2-4 GM/100ML-% IV SOLN
2.0000 g | INTRAVENOUS | Status: AC
  Administered 2019-01-13: 08:00:00 2 g via INTRAVENOUS

## 2019-01-13 SURGICAL SUPPLY — 53 items
APPLIER CLIP 9.375 MED OPEN (MISCELLANEOUS) ×3
BINDER BREAST LRG (GAUZE/BANDAGES/DRESSINGS) IMPLANT
BINDER BREAST MEDIUM (GAUZE/BANDAGES/DRESSINGS) IMPLANT
BINDER BREAST XLRG (GAUZE/BANDAGES/DRESSINGS) ×3 IMPLANT
BINDER BREAST XXLRG (GAUZE/BANDAGES/DRESSINGS) IMPLANT
BLADE SURG 15 STRL LF DISP TIS (BLADE) ×1 IMPLANT
BLADE SURG 15 STRL SS (BLADE) ×2
CANISTER SUC SOCK COL 7IN (MISCELLANEOUS) IMPLANT
CANISTER SUCT 1200ML W/VALVE (MISCELLANEOUS) IMPLANT
CHLORAPREP W/TINT 26 (MISCELLANEOUS) ×3 IMPLANT
CLIP APPLIE 9.375 MED OPEN (MISCELLANEOUS) ×1 IMPLANT
COVER BACK TABLE REUSABLE LG (DRAPES) ×3 IMPLANT
COVER MAYO STAND REUSABLE (DRAPES) ×3 IMPLANT
COVER PROBE W GEL 5X96 (DRAPES) ×3 IMPLANT
COVER WAND RF STERILE (DRAPES) IMPLANT
DECANTER SPIKE VIAL GLASS SM (MISCELLANEOUS) IMPLANT
DERMABOND ADVANCED (GAUZE/BANDAGES/DRESSINGS) ×2
DERMABOND ADVANCED .7 DNX12 (GAUZE/BANDAGES/DRESSINGS) ×1 IMPLANT
DRAPE LAPAROSCOPIC ABDOMINAL (DRAPES) IMPLANT
DRAPE LAPAROTOMY 100X72 PEDS (DRAPES) ×3 IMPLANT
DRAPE UTILITY XL STRL (DRAPES) ×3 IMPLANT
ELECT COATED BLADE 2.86 ST (ELECTRODE) ×3 IMPLANT
ELECT REM PT RETURN 9FT ADLT (ELECTROSURGICAL) ×3
ELECTRODE REM PT RTRN 9FT ADLT (ELECTROSURGICAL) ×1 IMPLANT
GLOVE BIO SURGEON STRL SZ7 (GLOVE) ×3 IMPLANT
GLOVE BIOGEL PI IND STRL 7.5 (GLOVE) ×1 IMPLANT
GLOVE BIOGEL PI IND STRL 8 (GLOVE) ×1 IMPLANT
GLOVE BIOGEL PI INDICATOR 7.5 (GLOVE) ×2
GLOVE BIOGEL PI INDICATOR 8 (GLOVE) ×2
GLOVE ECLIPSE 8.0 STRL XLNG CF (GLOVE) ×3 IMPLANT
GLOVE EXAM NITRILE MD LF STRL (GLOVE) ×3 IMPLANT
GOWN STRL REUS W/ TWL LRG LVL3 (GOWN DISPOSABLE) ×1 IMPLANT
GOWN STRL REUS W/ TWL XL LVL3 (GOWN DISPOSABLE) ×1 IMPLANT
GOWN STRL REUS W/TWL LRG LVL3 (GOWN DISPOSABLE) ×2
GOWN STRL REUS W/TWL XL LVL3 (GOWN DISPOSABLE) ×2
HEMOSTAT ARISTA ABSORB 3G PWDR (HEMOSTASIS) IMPLANT
HEMOSTAT SNOW SURGICEL 2X4 (HEMOSTASIS) IMPLANT
KIT MARKER MARGIN INK (KITS) ×3 IMPLANT
NEEDLE HYPO 25X1 1.5 SAFETY (NEEDLE) ×3 IMPLANT
NS IRRIG 1000ML POUR BTL (IV SOLUTION) ×3 IMPLANT
PACK BASIN DAY SURGERY FS (CUSTOM PROCEDURE TRAY) ×3 IMPLANT
PENCIL BUTTON HOLSTER BLD 10FT (ELECTRODE) ×3 IMPLANT
SLEEVE SCD COMPRESS KNEE MED (MISCELLANEOUS) ×3 IMPLANT
SPONGE LAP 4X18 RFD (DISPOSABLE) ×3 IMPLANT
SUT MNCRL AB 4-0 PS2 18 (SUTURE) ×3 IMPLANT
SUT SILK 2 0 SH (SUTURE) IMPLANT
SUT VICRYL 3-0 CR8 SH (SUTURE) ×3 IMPLANT
SYR CONTROL 10ML LL (SYRINGE) ×3 IMPLANT
TOWEL GREEN STERILE FF (TOWEL DISPOSABLE) ×3 IMPLANT
TRAY FAXITRON CT DISP (TRAY / TRAY PROCEDURE) ×3 IMPLANT
TUBE CONNECTING 20'X1/4 (TUBING)
TUBE CONNECTING 20X1/4 (TUBING) IMPLANT
YANKAUER SUCT BULB TIP NO VENT (SUCTIONS) IMPLANT

## 2019-01-13 NOTE — H&P (Signed)
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 She underwent additional biopsies of a 5 cm area of calcifications which showed intermediate to high-grade DCIS. She is here today to discuss surgical options. She had 2 areas in the right upper inner quadrant and one in the tail of Spence.  ADDITIONAL INFORMATION: 1. PROGNOSTIC INDICATORS Results: IMMUNOHISTOCHEMICAL AND MORPHOMETRIC ANALYSIS PERFORMED MANUALLY Estrogen Receptor: 0%, NEGATIVE Progesterone Receptor: 0%, NEGATIVE COMMENT: The negative hormone receptor study(ies) in this case has an internal positive control. REFERENCE RANGE ESTROGEN RECEPTOR NEGATIVE 0% POSITIVE =>1% REFERENCE RANGE PROGESTERONE RECEPTOR NEGATIVE 0% POSITIVE =>1% All controls stained appropriately Diana Sheller MD Pathologist, Electronic Signature ( Signed 10/17/2018) 2. PROGNOSTIC INDICATORS Results: IMMUNOHISTOCHEMICAL AND MORPHOMETRIC ANALYSIS PERFORMED MANUALLY Estrogen Receptor: 70%, POSITIVE, STRONG STAINING INTENSITY Progesterone Receptor: 90%, POSITIVE, STRONG STAINING INTENSITY REFERENCE RANGE ESTROGEN RECEPTOR NEGATIVE 0% 1 of 3 FINAL for [name] ([id number]) ADDITIONAL INFORMATION:(continued) POSITIVE =>1% REFERENCE RANGE PROGESTERONE RECEPTOR NEGATIVE 0% POSITIVE =>1% All controls stained appropriately Diana Sheller MD Pathologist, Electronic Signature ( Signed 10/17/2018) FINAL DIAGNOSIS Diagnosis 1. Breast, right, needle core biopsy, LOQ, middle depth - DUCTAL CARCINOMA IN SITU, HIGH GRADE WITH CALCIFICATIONS. SEE NOTE. 2. Breast, right, needle core biopsy, LOQ, posterior depth - DUCTAL CARCINOMA IN SITU, INTERMEDIATE TO HIGH GRADE WITH CALCIFICATIONS. SEE NOTE. Diagnosis Note 2. and 2. Dr. Jeannie Kelly has reviewed  this case and concurs with the above diagnosis. A breast prognostic profile (ER and PR) is pending and will be reported in an addendum. The [name] was notified on 10/15/2018. Diana Folds MD Pathologist, Electronic Signature (Case signed 10/15/2018) Specimen Gross and Clinical Information Specimen Comment 1. In formalin 8:58, extracted 3 minutes ; yes positive of calcifications in same quadrant; recent diagnosis of right breast DCIS, lower, outer quadrant, 2 additional groups of calcifications in same quadrant biopsied for extent of disease 2. In formalin 9:17, extracted 3 minutes for each Specimen(s) Obtained: 1. Breast, right, needle core biopsy, LOQ, middle depth 2. Breast, right, needle core biopsy, LOQ, posterior depth Specimen Clinical Information 1. DCIS,        Calcifications in the right breast were recently biopsied, demonstrating high grade ductal carcinoma in situ. Calcifications in the left breast were biopsied demonstrating fibrocystic changes.  LABS: None provided  EXAM: BILATERAL BREAST MRI WITH AND WITHOUT CONTRAST  TECHNIQUE: Multiplanar, multisequence MR images of both breasts were obtained prior to and following the intravenous administration of 7.5 ml of Gadavist  Three-dimensional MR images were rendered by post-processing of the original MR data on an independent workstation. The three-dimensional MR images were interpreted, and findings are reported in the following complete MRI report for this study. Three dimensional images were evaluated at the independent DynaCad workstation  COMPARISON: Previous exam(s).  FINDINGS: Breast composition: c. Heterogeneous fibroglandular tissue.  Background parenchymal enhancement: Marked  Right breast: The biopsy clip at the site of known DCIS in the lower outer right breast is identified. There is non mass enhancement located superior, slightly anterior,  and posterior to the biopsy clip which corresponds to the site of remaining calcifications identified. This enhancement is difficult to definitively differentiate from the significant background enhancement on today's study. The overall extent of this enhancement is similar to the extent suggested by the remaining calcifications, spanning nearly 5 cm. The calcifications span up to  4.6 cm.  Left breast: No mass or abnormal enhancement.  Lymph nodes: No abnormal appearing lymph nodes.  Ancillary findings: None.  IMPRESSION: 1. The patient's biopsy clip is noted. Enhancement extending superior, slightly anterior, and posterior to the biopsy clip roughly corresponds to the remaining calcifications. It is difficult to definitively differentiate this enhancement from background enhancement. 2. No evidence of malignancy in the left breast. 3. No axillary adenopathy.  RECOMMENDATION: Recommend stereotactic biopsy of the remaining calcifications seen mammographically in the lower outer right breast. Recommend biopsying the anterior and posterior extent of the remaining unbiopsied calcifications.  BI-RADS CATEGORY 6: Known biopsy-proven malignancy.  The patient is a 43 year old female.   Allergies  Tamiflu *ANTIVIRALS* Seizure Cipro *FLUOROQUINOLONES* Diarrhea, Nausea, Vomiting. Metoclopramide HCl *GASTROINTESTINAL AGENTS - MISC.* "Lock Jaw" per epic note Sulfa Antibiotics Nausea, Vomiting. Allergies Reconciled  Medication History  Strattera (80MG  Capsule, Oral) Active. Flonase (50MCG/DOSE Inhaler, Nasal) Active. Neurontin (100MG  Capsule, Oral) Active. Mirena (52 MG) (20MCG/24HR IUD, Intrauterine) Active. Multiple Vitamin (1 (one) Oral) Active. PriLOSEC (40MG  Capsule DR, Oral) Active. Valtrex (1GM Tablet, Oral) Active. Cranberry (200MG  Capsule, Oral) Active. Medications Reconciled    Vitals  10/20/2018 9:26 AM Weight: 153.5 lb Height:  63in Body Surface Area: 1.73 m Body Mass Index: 27.19 kg/m  Temp.: 98.51F(Oral)  Pulse: 97 (Regular)  P.OX: 97% (Room air) BP: 128/72 (Sitting, Left Arm, Standard)      Physical Exam   General Mental Status-Alert. General Appearance-Consistent with stated age. Hydration-Well hydrated. Voice-Normal.  Breast Note: Bruising right breast in the tail Spence. No masses. Left breast normal.  Neurologic Neurologic evaluation reveals -alert and oriented x 3 with no impairment of recent or remote memory. Mental Status-Normal.    Assessment & Plan   BREAST NEOPLASM, TIS (DCIS), RIGHT (D05.11) Impression: Multifocal  Discussed breast preservation versus mastectomy. We certainly can usually precedes to localize these areas and due to lumpectomies. I discussed the role of sentinel lymph node mapping in this circumstance as well as potential postoperative treatments in the need for reexcision lumpectomy in these cases as well as potential mastectomy and reconstruction. Risk of lumpectomy include bleeding, infection, seroma, more surgery, use of seed/wire, wound care, cosmetic deformity and the need for other treatments, death , blood clots, death. Pt agrees to proceed.  Current Plans We discussed the staging and pathophysiology of breast cancer. We discussed all of the different options for treatment for breast cancer including surgery, chemotherapy, radiation therapy, Herceptin, and antiestrogen therapy. We discussed a sentinel lymph node biopsy as she does not appear to having lymph node involvement right now. We discussed the performance of that with injection of radioactive tracer and blue dye. We discussed that she would have an incision underneath her axillary hairline. We discussed that there is a bout a 10-20% chance of having a positive node with a sentinel lymph node biopsy and we will await the permanent pathology to make any other first further  decisions in terms of her treatment. One of these options might be to return to the operating room to perform an axillary lymph node dissection. We discussed about a 1-2% risk lifetime of chronic shoulder pain as well as lymphedema associated with a sentinel lymph node biopsy. We discussed the options for treatment of the breast cancer which included lumpectomy versus a mastectomy. We discussed the performance of the lumpectomy with a wire placement. We discussed a 10-20% chance of a positive margin requiring reexcision in the operating room. We also discussed that she  may need radiation therapy or antiestrogen therapy or both if she undergoes lumpectomy. We discussed the mastectomy and the postoperative care for that as well. We discussed that there is no difference in her survival whether she undergoes lumpectomy with radiation therapy or antiestrogen therapy versus a mastectomy. There is a slight difference in the local recurrence rate being 3-5% with lumpectomy and about 1% with a mastectomy. We discussed the risks of operation including bleeding, infection, possible reoperation. She understands her further therapy will be based on what her stages at the time of her operation.  Pt Education - [facility] breast cancer surgery: discussed with patient and provided information. Pt Education - [facility] Breast Biopsy HCI: discussed with patient and provided information. Pt Education - [facility] Breast Pains Education Pt Education - [facility] (After Breast Cancer) Class Info: discussed with patient and provided information.

## 2019-01-13 NOTE — Discharge Instructions (Signed)
[facility] °Office Phone Number [phone] ° °BREAST BIOPSY/ PARTIAL MASTECTOMY: POST OP INSTRUCTIONS ° °Always review your discharge instruction sheet given to you by the facility where your surgery was performed. ° °IF YOU HAVE DISABILITY OR FAMILY LEAVE FORMS, YOU MUST BRING THEM TO THE OFFICE FOR PROCESSING.  DO NOT GIVE THEM TO YOUR DOCTOR. ° °1. A prescription for pain medication may be given to you upon discharge.  Take your pain medication as prescribed, if needed.  If narcotic pain medicine is not needed, then you may take acetaminophen (Tylenol) or ibuprofen (Advil) as needed. °2. Take your usually prescribed medications unless otherwise directed °3. If you need a refill on your pain medication, please contact your pharmacy.  They will contact our office to request authorization.  Prescriptions will not be filled after 5pm or on week-ends. °4. You should eat very light the first 24 hours after surgery, such as soup, crackers, pudding, etc.  Resume your normal diet the day after surgery. °5. Most patients will experience some swelling and bruising in the breast.  Ice packs and a good support bra will help.  Swelling and bruising can take several days to resolve.  °6. It is common to experience some constipation if taking pain medication after surgery.  Increasing fluid intake and taking a stool softener will usually help or prevent this problem from occurring.  A mild laxative (Milk of Magnesia or Miralax) should be taken according to package directions if there are no bowel movements after 48 hours. °7. Unless discharge instructions indicate otherwise, you may remove your bandages 24-48 hours after surgery, and you may shower at that time.  You may have steri-strips (small skin tapes) in place directly over the incision.  These strips should be left on the skin for 7-10 days.  If your surgeon used skin glue on the incision, you may shower in 24 hours.  The glue will flake off over the  next 2-3 weeks.  Any sutures or staples will be removed at the office during your follow-up visit. °8. ACTIVITIES:  You may resume regular daily activities (gradually increasing) beginning the next day.  Wearing a good support bra or sports bra minimizes pain and swelling.  You may have sexual intercourse when it is comfortable. °a. You may drive when you no longer are taking prescription pain medication, you can comfortably wear a seatbelt, and you can safely maneuver your car and apply brakes. °b. RETURN TO WORK:  ______________________________________________________________________________________ °9. You should see your doctor in the office for a follow-up appointment approximately two weeks after your surgery.  Your doctor’s nurse will typically make your follow-up appointment when she calls you with your pathology report.  Expect your pathology report 2-3 business days after your surgery.  You may call to check if you do not hear from us after three days. °10. OTHER INSTRUCTIONS: _______________________________________________________________________________________________ _____________________________________________________________________________________________________________________________________ °_____________________________________________________________________________________________________________________________________ °_____________________________________________________________________________________________________________________________________ ° °WHEN TO CALL YOUR DOCTOR: °1. Fever over 101.0 °2. Nausea and/or vomiting. °3. Extreme swelling or bruising. °4. Continued bleeding from incision. °5. Increased pain, redness, or drainage from the incision. ° °The clinic staff is available to answer your questions during regular business hours.  Please don’t hesitate to call and ask to speak to one of the nurses for clinical concerns.  If you have a medical emergency, go to the nearest  emergency room or call 911.  A surgeon from [facility] is always on call at the hospital. ° °For further questions, please visit [email or web address]  ° ° ° ° °  Post Anesthesia Home Care Instructions ° °Activity: °Get plenty of rest for the remainder of the day. A responsible individual must stay with you for 24 hours following the procedure.  °For the next 24 hours, DO NOT: °-Drive a car °-Operate machinery °-Drink alcoholic beverages °-Take any medication unless instructed by your physician °-Make any legal decisions or sign important papers. ° °Meals: °Start with liquid foods such as gelatin or soup. Progress to regular foods as tolerated. Avoid greasy, spicy, heavy foods. If nausea and/or vomiting occur, drink only clear liquids until the nausea and/or vomiting subsides. Call your physician if vomiting continues. ° °Special Instructions/Symptoms: °Your throat may feel dry or sore from the anesthesia or the breathing tube placed in your throat during surgery. If this causes discomfort, gargle with warm salt water. The discomfort should disappear within 24 hours. ° °If you had a scopolamine patch placed behind your ear for the management of post- operative nausea and/or vomiting: ° °1. The medication in the patch is effective for 72 hours, after which it should be removed.  Wrap patch in a tissue and discard in the trash. Wash hands thoroughly with soap and water. °2. You may remove the patch earlier than 72 hours if you experience unpleasant side effects which may include dry mouth, dizziness or visual disturbances. °3. Avoid touching the patch. Wash your hands with soap and water after contact with the patch. °  ° °

## 2019-01-13 NOTE — Anesthesia Postprocedure Evaluation (Signed)
Anesthesia Post Note  Patient: Shanavia Makela Vollmer  Procedure(s) Performed: RIGHT BREAST LUMPECTOMY WITH RADIOACTIVE SEED LOCALIZATION X3 (Right Breast)     Patient location during evaluation: PACU Anesthesia Type: General Level of consciousness: awake and alert Pain management: pain level controlled Vital Signs Assessment: post-procedure vital signs reviewed and stable Respiratory status: spontaneous breathing, nonlabored ventilation and respiratory function stable Cardiovascular status: blood pressure returned to baseline and stable Postop Assessment: no apparent nausea or vomiting Anesthetic complications: no    Last Vitals:  Vitals:   01/13/19 0930 01/13/19 1018  BP: (!) 150/101 (!) 151/100  Pulse: 85 85  Resp: 14 16  Temp:  37.3 C  SpO2: 96% 99%    Last Pain:  Vitals:   01/13/19 1024  TempSrc:   PainSc: 4                  Lidia Collum

## 2019-01-13 NOTE — Interval H&P Note (Signed)
History and Physical Interval Note:  01/13/2019 7:24 AM  [name]  has presented today for surgery, with the diagnosis of RIGHT BREAST CANCER.  The various methods of treatment have been discussed with the patient and family. After consideration of risks, benefits and other options for treatment, the patient has consented to  Procedure(s): RIGHT BREAST LUMPECTOMY WITH RADIOACTIVE SEED LOCALIZATION X3 (Right) as a surgical intervention.  The patient's history has been reviewed, patient examined, no change in status, stable for surgery.  I have reviewed the patient's chart and labs.  Questions were answered to the patient's satisfaction.     [place]

## 2019-01-13 NOTE — Anesthesia Procedure Notes (Signed)
Procedure Name: LMA Insertion Date/Time: 01/13/2019 7:39 AM Performed by: Oyinkansola Truax, Ernesta Amble, CRNA Pre-anesthesia Checklist: Patient identified, Emergency Drugs available, Suction available and Patient being monitored Patient Re-evaluated:Patient Re-evaluated prior to induction Oxygen Delivery Method: Circle system utilized Preoxygenation: Pre-oxygenation with 100% oxygen Induction Type: IV induction Ventilation: Mask ventilation without difficulty LMA: LMA inserted LMA Size: 4.0 Number of attempts: 1 Airway Equipment and Method: Bite block Placement Confirmation: positive ETCO2 Tube secured with: Tape Dental Injury: Teeth and Oropharynx as per pre-operative assessment

## 2019-01-13 NOTE — Anesthesia Preprocedure Evaluation (Addendum)
Anesthesia Evaluation  Patient identified by MRN, date of birth, ID band Patient awake    Reviewed: Allergy & Precautions, NPO status , Patient's Chart, lab work & pertinent test results  History of Anesthesia Complications Negative for: history of anesthetic complications  Airway Mallampati: II  TM Distance: >3 FB Neck ROM: Full    Dental no notable dental hx.    Pulmonary neg pulmonary ROS, former smoker,    Pulmonary exam normal        Cardiovascular hypertension, Normal cardiovascular exam     Neuro/Psych negative neurological ROS  negative psych ROS   GI/Hepatic Neg liver ROS, GERD  Medicated and Controlled,  Endo/Other  negative endocrine ROS  Renal/GU negative Renal ROS  negative genitourinary   Musculoskeletal negative musculoskeletal ROS (+)   Abdominal   Peds  Hematology negative hematology ROS (+)   Anesthesia Other Findings   Reproductive/Obstetrics                            Anesthesia Physical Anesthesia Plan  ASA: II  Anesthesia Plan: General   Post-op Pain Management:    Induction: Intravenous  PONV Risk Score and Plan: 3 and Ondansetron, Dexamethasone, Midazolam and Treatment may vary due to age or medical condition  Airway Management Planned: LMA  Additional Equipment: None  Intra-op Plan:   Post-operative Plan: Extubation in OR  Informed Consent: I have reviewed the patients History and Physical, chart, labs and discussed the procedure including the risks, benefits and alternatives for the proposed anesthesia with the patient or authorized representative who has indicated his/her understanding and acceptance.     Dental advisory given  Plan Discussed with:   Anesthesia Plan Comments:         Anesthesia Quick Evaluation

## 2019-01-13 NOTE — Transfer of Care (Signed)
Immediate Anesthesia Transfer of Care Note  Patient: [name]  Procedure(s) Performed: RIGHT BREAST LUMPECTOMY WITH RADIOACTIVE SEED LOCALIZATION X3 (Right Breast)  Patient Location: PACU  Anesthesia Type:General  Level of Consciousness: awake, alert , oriented and patient cooperative  Airway & Oxygen Therapy: Patient Spontanous Breathing and Patient connected to nasal cannula oxygen  Post-op Assessment: Report given to RN and Post -op Vital signs reviewed and stable  Post vital signs: Reviewed and stable  Last Vitals:  Vitals Value Taken Time  BP    Temp    Pulse 111 01/13/2019  8:46 AM  Resp    SpO2 98 % 01/13/2019  8:46 AM  Vitals shown include unvalidated device data.  Last Pain:  Vitals:   01/13/19 9528  TempSrc:   PainSc: 3          Complications: No apparent anesthesia complications

## 2019-01-13 NOTE — Op Note (Signed)
Preoperative diagnosis: Right breast DCIS  Postoperative diagnosis: Same  Procedure: Right breast seed localized lumpectomy with 3 localizing seeds  Surgeon: Erroll Luna, MD  Anesthesia: LMA with local consisting of 0.25% Sensorcaine with epinephrine  Specimen: Right breast tissue with seed x3 and 3 clips verified by Faxitron  Drains: None  IV fluids: Per record  EBL: 20 cc  Indications for procedure: The patient is a 43 year old female with recently diagnosed right breast DCIS verified by mammography and core biopsy.  The area was roughly 5 cm in the lower outer quadrant.  She opted for lumpectomy after reviewing her options.The procedure has been discussed with the patient. Alternatives to surgery have been discussed with the patient.  Risks of surgery include bleeding,  Infection,  Seroma formation, death,  and the need for further surgery.   The patient understands and wishes to proceed.   Description of procedure: The patient was met in the holding area.  Neoprobe used to verify seed location x3.  Questions answered and right side marked as correct.  She was then taken back to the operating room.  She is placed upon upon the operative room table.  After induction of general anesthesia, right breast was prepped and draped in sterile fashion timeout was performed.  Neoprobe was used to verify location of all 3 seeds in the right breast lower quadrant.  Incision made in the right breast lower outer quadrant.  Dissection was carried around all 3 seeds and this was removed as one specimen.  Margins were grossly negative.  Faxitron image revealed seeds and clip to be present with what appeared to be a grossly negative margin.  Hemostasis achieved.  Clips placed in the cavity was irrigated and local anesthetic was infiltrated.  Wound closed with 3-0 Vicryl and 4-0 Monocryl.  Dermabond applied.  All final counts found to be correct and a breast binder placed.  Patient was awoke extubated taken  recovery in satisfactory condition.

## 2019-01-14 ENCOUNTER — Encounter (HOSPITAL_BASED_OUTPATIENT_CLINIC_OR_DEPARTMENT_OTHER): Payer: Self-pay | Admitting: Surgery

## 2019-01-16 ENCOUNTER — Encounter: Payer: Self-pay | Admitting: *Deleted

## 2019-02-09 ENCOUNTER — Inpatient Hospital Stay: Payer: BC Managed Care – PPO | Attending: Oncology | Admitting: Oncology

## 2019-02-09 ENCOUNTER — Other Ambulatory Visit: Payer: Self-pay

## 2019-02-09 VITALS — BP 157/102 | HR 99 | Temp 98.0°F | Resp 20 | Ht 63.0 in | Wt 156.1 lb

## 2019-02-09 DIAGNOSIS — D0511 Intraductal carcinoma in situ of right breast: Secondary | ICD-10-CM

## 2019-02-09 MED ORDER — TAMOXIFEN CITRATE 20 MG PO TABS: 20.0000 mg | ORAL_TABLET | Freq: Every day | ORAL | 12 refills | Status: AC

## 2019-02-09 NOTE — Progress Notes (Signed)
[facility]  [phone] [phone]) [phone]    ID: [name] DOB: 08/11/1975  MR#: [id number]  ZYS#:[id number]  Patient Care Team: Carollee Herter, Alferd Apa, DO as PCP - General Susa Day, MD (Orthopedic Surgery) Mauro Kaufmann, RN as Oncology Nurse Navigator Rockwell Germany, RN as Oncology Nurse Navigator Erroll Luna, MD as Consulting Physician (General Surgery) Oluwatobiloba Martin, [name], MD as Consulting Physician (Oncology) Kyung Rudd, MD as Consulting Physician (Radiation Oncology) Everlene Farrier, MD as Consulting Physician (Obstetrics and Gynecology) Wylene Simmer, MD as Consulting Physician (Orthopedic Surgery) OTHER MD:    CHIEF COMPLAINT: 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   In the right breast, magnified views demonstrate a group of fine pleomorphic calcifications in the lower outer quadrant of the right breast spanning 2.8 x 1.7 x 5.0 cm. Calcifications are not typical for fat necrosis.   Accordingly on 09/17/2018 she proceeded to biopsy of the right breast area in question. The pathology from this procedure showed ([id number]): ductal carcinoma in situ with calcifications, high grade. Prognostic indicators significant for: estrogen receptor, 0% negative and progesterone receptor, 0% negative.   In the left breast, magnified views of the left breast calcifications demonstrate a group of faint pleomorphic calcifications in the upper-outer quadrant measuring approximately 0.8 cm in diameter. Calcifications are not typical for fat necrosis.   Accordingly, she underwent a biopsy of the right breast area in question on 09/17/2018. The pathology from  this procedure showed ([id number]): fibrocystic changes with adenosis and calcifications. There is no evidence of malignancy.    The patient's subsequent history is as detailed below.   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   Since her last visit here, she underwent genetic testing on 09/24/2018 through [facility]'s Breast Cancer STAT Panel + Common Hereditary Cancers Panel showed no pathogenic mutations. The STAT Breast cancer panel offered by [facility] includes sequencing and rearrangement analysis for the following 9 genes:  ATM, BRCA1, BRCA2, CDH1, CHEK2, PALB2, PTEN, STK11 and TP53. The Common Hereditary Cancers Panel offered by [facility] includes sequencing and/or deletion duplication testing of the following 47 genes: APC, ATM, AXIN2, BARD1, BMPR1A, BRCA1, BRCA2, BRIP1, CDH1, CDKN2A (p14ARF), CDKN2A (p16INK4a), CKD4, CHEK2, CTNNA1, DICER1, EPCAM (Deletion/duplication testing only), GREM1 (promoter region deletion/duplication testing only), KIT, MEN1, MLH1, MSH2, MSH3, MSH6, MUTYH, NBN, NF1, NHTL1, PALB2, PDGFRA, PMS2, POLD1, POLE, PTEN, RAD50, RAD51C, RAD51D, SDHB, SDHC, SDHD, SMAD4, SMARCA4. STK11, TP53, TSC1, TSC2, and VHL.  The following genes were evaluated for sequence changes only: SDHA and HOXB13 c.251G>A variant only.   She also underwent a right breast lumpectomy on 01/13/2019. The pathology from this procedure showed ([id number]):  Breast, lumpectomy, right w/seed x3 - ductal carcinoma in situ, 4 cm, high nuclear grade with central necrosis. - margins of resection are not involved (closest margins: less than 1 mm, lateral and posterior). - biopsy site changes.   - see oncology table.  She is scheduled for simulation 02/12/2019  REVIEW OF SYSTEMS: [name] did well with her surgery, taking minimal pain medicine.  There was no fever or bleeding complications.  She is very pleased with the cosmetic  result.  She is exercising chiefly by walking her dog approximately  30 minutes once a day.  She has not gone into [place] on her [place] because she is afraid of getting the incisions wet.  She will check with Dr. Brantley Stage regarding that.  Aside from these issues a detailed review of systems today was negative  PAST MEDICAL HISTORY: Past Medical History:  Diagnosis Date   Anemia    Back pain    Bimalleolar ankle fracture    left   Family history of breast cancer    GERD (gastroesophageal reflux disease)    Hypertension    no meds   Kidney infection    1998- hosp.- [facility]     PAST SURGICAL HISTORY: Past Surgical History:  Procedure Laterality Date   ANTERIOR LUMBAR FUSION  11/14/2011   Procedure: ANTERIOR LUMBAR FUSION 2 LEVELS;  Surgeon: Johnn Hai, MD;  Location: [facility];  Service: Orthopedics;  Laterality: N/A;  ALIF L5-S1   BACK SURGERY     BREAST LUMPECTOMY WITH RADIOACTIVE SEED LOCALIZATION Right 01/13/2019   Procedure: RIGHT BREAST LUMPECTOMY WITH RADIOACTIVE SEED LOCALIZATION X3;  Surgeon: Erroll Luna, MD;  Location: [facility];  Service: General;  Laterality: Right;   CESAREAN SECTION     2007- /w epidural  anesthesia    COLONOSCOPY     LUMBAR LAMINECTOMY/DECOMPRESSION MICRODISCECTOMY Right 12/15/2015   Procedure:  MICO LUMBER DECOMPRESSION L4-5 ON THE RIGHT, ;  Surgeon: Susa Day, MD;  Location: [facility];  Service: Orthopedics;  Laterality: Right;   ORIF ANKLE FRACTURE Left 10/03/2017   Procedure: OPEN REDUCTION INTERNAL FIXATION (ORIF) ANKLE BIMALLEOLAR FRACTURE; possible deltoid ligament repair;  Surgeon: Wylene Simmer, MD;  Location: [facility];  Service: Orthopedics;  Laterality: Left;   SPINE SURGERY     fusion   UPPER GASTROINTESTINAL ENDOSCOPY     WISDOM TOOTH EXTRACTION     young adult     FAMILY HISTORY: Family History  Problem Relation Age of Onset   Lupus Mother    Breast cancer Maternal Aunt     Cancer Maternal Aunt    Anesthesia problems Neg Hx    Colon cancer Neg Hx    Colon polyps Neg Hx    Esophageal cancer Neg Hx    Rectal cancer Neg Hx    Stomach cancer Neg Hx    [name] father and mother are both 62 as of 09/2018. The patient has 2 sisters. [name] paternal aunt was diagnosed with breast cancer at 60. Patient denies anyone in her family having breast, ovarian, prostate, or pancreatic cancer.    GYNECOLOGIC HISTORY:  No LMP recorded. (Menstrual status: IUD). 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. Her husband, [name] "BJ" Joswick, is a Freight forwarder for a Haematologist. Their son, Snigdha Howser, is 64 years old. She has a pit bull. [name] father, [name], is a retired Consulting civil engineer.    ADVANCED DIRECTIVES: Her husband, BJ, is automatically her healthcare power of attorney.     HEALTH MAINTENANCE: Social History   Tobacco Use   Smoking status: Former Smoker    Packs/day: 0.25    Years: 15.00    Pack years: 3.75    Types: E-cigarettes, Cigarettes    Quit date: 04/01/2017    Years since quitting: 1.8   Smokeless tobacco:  Never Used   Tobacco comment: vapes daily  Substance Use Topics   Alcohol use: Yes    Alcohol/week: 0.0 standard drinks    Comment: a glass of wine  on occas.    Drug use: No    Colonoscopy: yes, 2005  PAP: 09/12/2018  Bone density: no   Allergies  Allergen Reactions   Tamiflu [Oseltamivir Phosphate] Other (See Comments)    Seizure   Ciprofloxacin Diarrhea and Nausea And Vomiting   Metoclopramide Hcl Other (See Comments)    "lock jaw"   Sulfa Antibiotics Nausea And Vomiting    Current Outpatient Medications  Medication Sig Dispense Refill   HYDROcodone-acetaminophen (NORCO/VICODIN) 5-325 MG tablet Take  1 tablet by mouth every 6 (six) hours as needed for moderate pain. 15 tablet 0   ibuprofen (ADVIL) 800 MG tablet Take 1 tablet (800 mg total) by mouth every 8 (eight) hours as needed. 30 tablet 0   levonorgestrel (MIRENA) 20 MCG/24HR IUD 1 each by Intrauterine route once.     omeprazole (PRILOSEC) 40 MG capsule TAKE 1 CAPSULE BY MOUTH TWICE A DAY 60 capsule 11   valACYclovir (VALTREX) 1000 MG tablet Take 1 tablet (1,000 mg total) by mouth 3 (three) times daily as needed. For flare ups 90 tablet 2   No current facility-administered medications for this visit.      OBJECTIVE: Young white woman who appears well  Vitals:   02/09/19 1051  BP: (!) 157/102  Pulse: 99  Resp: 20  Temp: 98 F (36.7 C)  SpO2: 100%     Body mass index is 27.65 kg/m.   Wt Readings from Last 3 Encounters:  02/09/19 156 lb 1.6 oz (70.8 kg)  01/13/19 154 lb 8.7 oz (70.1 kg)  09/24/18 152 lb 12.8 oz (69.3 kg)      ECOG FS:0 - Asymptomatic  Sclerae unicteric, EOMs intact Mask in place No cervical or supraclavicular adenopathy Lungs no rales or rhonchi Heart regular rate and rhythm Abd soft, nontender, positive bowel sounds MSK no focal spinal tenderness, no upper extremity lymphedema Neuro: nonfocal, well oriented, appropriate affect Breasts: The right breast is status post recent lumpectomy.  The cosmetic result is good.  There is induration associated with the inframammary incision.  There is no erythema or swelling.  There is no dehiscence.  The left breast is benign.  Both axillae are benign.  LAB RESULTS:  CMP     Component Value Date/Time   NA 136 01/09/2019 1418   K 3.5 01/09/2019 1418   CL 99 01/09/2019 1418   CO2 24 01/09/2019 1418   GLUCOSE 134 (H) 01/09/2019 1418   BUN 7 01/09/2019 1418   CREATININE 0.74 01/09/2019 1418   CREATININE 0.78 09/24/2018 0849   CALCIUM 9.6 01/09/2019 1418   PROT 8.2 (H) 01/09/2019 1418   ALBUMIN 4.7 01/09/2019 1418   AST 157 (H) 01/09/2019 1418   AST  101 (H) 09/24/2018 0849   ALT 150 (H) 01/09/2019 1418   ALT 102 (H) 09/24/2018 0849   ALKPHOS 39 01/09/2019 1418   BILITOT 1.1 01/09/2019 1418   BILITOT 0.5 09/24/2018 0849   GFRNONAA >60 01/09/2019 1418   GFRNONAA >60 09/24/2018 0849   GFRAA >60 01/09/2019 1418   GFRAA >60 09/24/2018 0849    No results found for: TOTALPROTELP, ALBUMINELP, A1GS, A2GS, BETS, BETA2SER, GAMS, MSPIKE, SPEI  No results found for: KPAFRELGTCHN, LAMBDASER, KAPLAMBRATIO  Lab Results  Component Value Date   WBC 4.8 01/09/2019   NEUTROABS 3.0 01/09/2019  HGB 13.9 01/09/2019   HCT 41.0 01/09/2019   MCV 92.6 01/09/2019   PLT 182 01/09/2019    @LASTCHEMISTRY @  No results found for: LABCA2  No components found for: [id number]  No results for input(s): INR in the last 168 hours.  No results found for: LABCA2  No results found for: ZYY482  No results found for: NOI370  No results found for: WUG891  No results found for: CA2729  No components found for: HGQUANT  No results found for: CEA1 / No results found for: CEA1   No results found for: AFPTUMOR  No results found for: CHROMOGRNA  No results found for: PSA1  No visits with results within 3 Day(s) from this visit.  Latest known visit with results is:  Admission on 01/13/2019, Discharged on 01/13/2019  Component Date Value Ref Range Status   WBC 10/31/2018 5.0  4.0 - 10.5 K/uL Final   RBC 10/31/2018 4.02  3.87 - 5.11 MIL/uL Final   Hemoglobin 10/31/2018 12.9  12.0 - 15.0 g/dL Final   HCT 10/31/2018 37.8  36.0 - 46.0 % Final   MCV 10/31/2018 94.0  80.0 - 100.0 fL Final   MCH 10/31/2018 32.1  26.0 - 34.0 pg Final   MCHC 10/31/2018 34.1  30.0 - 36.0 g/dL Final   RDW 10/31/2018 12.6  11.5 - 15.5 % Final   Platelets 10/31/2018 184  150 - 400 K/uL Final   nRBC 10/31/2018 0.0  0.0 - 0.2 % Final   Neutrophils Relative % 10/31/2018 61  % Final   Neutro Abs 10/31/2018 3.0  1.7 - 7.7 K/uL Final   Lymphocytes Relative 10/31/2018  25  % Final   Lymphs Abs 10/31/2018 1.2  0.7 - 4.0 K/uL Final   Monocytes Relative 10/31/2018 13  % Final   Monocytes Absolute 10/31/2018 0.7  0.1 - 1.0 K/uL Final   Eosinophils Relative 10/31/2018 1  % Final   Eosinophils Absolute 10/31/2018 0.1  0.0 - 0.5 K/uL Final   Basophils Relative 10/31/2018 0  % Final   Basophils Absolute 10/31/2018 0.0  0.0 - 0.1 K/uL Final   Immature Granulocytes 10/31/2018 0  % Final   Abs Immature Granulocytes 10/31/2018 0.01  0.00 - 0.07 K/uL Final   Performed at [facility], [place] [address], [place], [place] [address]   Sodium 10/31/2018 135  135 - 145 mmol/L Final   Potassium 10/31/2018 3.3* 3.5 - 5.1 mmol/L Final   Chloride 10/31/2018 98  98 - 111 mmol/L Final   CO2 10/31/2018 23  22 - 32 mmol/L Final   Glucose, Bld 10/31/2018 116* 70 - 99 mg/dL Final   BUN 10/31/2018 9  6 - 20 mg/dL Final   Creatinine, Ser 10/31/2018 0.64  0.44 - 1.00 mg/dL Final   Calcium 10/31/2018 9.1  8.9 - 10.3 mg/dL Final   Total Protein 10/31/2018 7.9  6.5 - 8.1 g/dL Final   Albumin 10/31/2018 4.5  3.5 - 5.0 g/dL Final   AST 10/31/2018 246* 15 - 41 U/L Final   ALT 10/31/2018 215* 0 - 44 U/L Final   Alkaline Phosphatase 10/31/2018 30* 38 - 126 U/L Final   Total Bilirubin 10/31/2018 0.9  0.3 - 1.2 mg/dL Final   GFR calc non Af Amer 10/31/2018 >60  >60 mL/min Final   GFR calc Af Amer 10/31/2018 >60  >60 mL/min Final   Anion gap 10/31/2018 14  5 - 15 Final   Performed at [facility], [place] [address]  [place], [place] [address]   Sodium 11/11/2018 138  135 - 145 mmol/L Final   Potassium 11/11/2018 3.5  3.5 - 5.1 mmol/L Final   Chloride 11/11/2018 99  98 - 111 mmol/L Final   CO2 11/11/2018 28  22 - 32 mmol/L Final   Glucose, Bld 11/11/2018 131* 70 - 99 mg/dL Final   BUN 11/11/2018 7  6 - 20 mg/dL Final   Creatinine, Ser 11/11/2018 0.60  0.44 - 1.00 mg/dL Final   Calcium 11/11/2018 9.9  8.9 - 10.3 mg/dL Final   Total Protein  11/11/2018 8.0  6.5 - 8.1 g/dL Final   Albumin 11/11/2018 4.5  3.5 - 5.0 g/dL Final   AST 11/11/2018 130* 15 - 41 U/L Final   ALT 11/11/2018 155* 0 - 44 U/L Final   Alkaline Phosphatase 11/11/2018 32* 38 - 126 U/L Final   Total Bilirubin 11/11/2018 0.9  0.3 - 1.2 mg/dL Final   GFR calc non Af Amer 11/11/2018 >60  >60 mL/min Final   GFR calc Af Amer 11/11/2018 >60  >60 mL/min Final   Anion gap 11/11/2018 11  5 - 15 Final   Performed at [facility], [place] [address] [place], [place] [address]   WBC 01/09/2019 4.8  4.0 - 10.5 K/uL Final   RBC 01/09/2019 4.43  3.87 - 5.11 MIL/uL Final   Hemoglobin 01/09/2019 13.9  12.0 - 15.0 g/dL Final   HCT 01/09/2019 41.0  36.0 - 46.0 % Final   MCV 01/09/2019 92.6  80.0 - 100.0 fL Final   MCH 01/09/2019 31.4  26.0 - 34.0 pg Final   MCHC 01/09/2019 33.9  30.0 - 36.0 g/dL Final   RDW 01/09/2019 12.4  11.5 - 15.5 % Final   Platelets 01/09/2019 182  150 - 400 K/uL Final   nRBC 01/09/2019 0.0  0.0 - 0.2 % Final   Neutrophils Relative % 01/09/2019 63  % Final   Neutro Abs 01/09/2019 3.0  1.7 - 7.7 K/uL Final   Lymphocytes Relative 01/09/2019 26  % Final   Lymphs Abs 01/09/2019 1.3  0.7 - 4.0 K/uL Final   Monocytes Relative 01/09/2019 10  % Final   Monocytes Absolute 01/09/2019 0.5  0.1 - 1.0 K/uL Final   Eosinophils Relative 01/09/2019 1  % Final   Eosinophils Absolute 01/09/2019 0.0  0.0 - 0.5 K/uL Final   Basophils Relative 01/09/2019 0  % Final   Basophils Absolute 01/09/2019 0.0  0.0 - 0.1 K/uL Final   Immature Granulocytes 01/09/2019 0  % Final   Abs Immature Granulocytes 01/09/2019 0.02  0.00 - 0.07 K/uL Final   Performed at [facility], [place] [address], [place], [place] [address]   Sodium 01/09/2019 136  135 - 145 mmol/L Final   Potassium 01/09/2019 3.5  3.5 - 5.1 mmol/L Final   Chloride 01/09/2019 99  98 - 111 mmol/L Final   CO2 01/09/2019 24  22 - 32 mmol/L Final   Glucose, Bld 01/09/2019 134* 70 -  99 mg/dL Final   BUN 01/09/2019 7  6 - 20 mg/dL Final   Creatinine, Ser 01/09/2019 0.74  0.44 - 1.00 mg/dL Final   Calcium 01/09/2019 9.6  8.9 - 10.3 mg/dL Final   Total Protein 01/09/2019 8.2* 6.5 - 8.1 g/dL Final   Albumin 01/09/2019 4.7  3.5 - 5.0 g/dL Final   AST 01/09/2019 157* 15 - 41 U/L Final   ALT 01/09/2019 150* 0 - 44 U/L Final   Alkaline Phosphatase 01/09/2019  39  38 - 126 U/L Final   Total Bilirubin 01/09/2019 1.1  0.3 - 1.2 mg/dL Final   GFR calc non Af Amer 01/09/2019 >60  >60 mL/min Final   GFR calc Af Amer 01/09/2019 >60  >60 mL/min Final   Anion gap 01/09/2019 13  5 - 15 Final   Performed at [facility] [address], [place], [place] [address]    (this displays the last labs from the last 3 days)  No results found for: TOTALPROTELP, ALBUMINELP, A1GS, A2GS, BETS, BETA2SER, GAMS, MSPIKE, SPEI (this displays SPEP labs)  No results found for: KPAFRELGTCHN, LAMBDASER, KAPLAMBRATIO (kappa/lambda light chains)  No results found for: HGBA, HGBA2QUANT, HGBFQUANT, HGBSQUAN (Hemoglobinopathy evaluation)   No results found for: LDH  No results found for: IRON, TIBC, IRONPCTSAT (Iron and TIBC)  No results found for: FERRITIN  Urinalysis    Component Value Date/Time   COLORURINE YELLOW 11/08/2011 0912   APPEARANCEUR CLEAR 11/08/2011 0912   LABSPEC 1.023 11/08/2011 0912   PHURINE 5.0 11/08/2011 0912   GLUCOSEU NEGATIVE 11/08/2011 0912   GLUCOSEU NEG mg/dL 09/25/2006 2234   HGBUR NEGATIVE 11/08/2011 0912   HGBUR large 06/01/2009 1449   BILIRUBINUR neg 01/20/2018 1751   KETONESUR NEGATIVE 11/08/2011 0912   PROTEINUR Positive (A) 01/20/2018 1751   PROTEINUR NEGATIVE 11/08/2011 0912   UROBILINOGEN 0.2 01/20/2018 1751   UROBILINOGEN 0.2 11/08/2011 0912   NITRITE neg 01/20/2018 1751   NITRITE NEGATIVE 11/08/2011 0912   LEUKOCYTESUR Large (3+) (A) 01/20/2018 1751     STUDIES:  Mm Breast Surgical Specimen  Result Date: 01/13/2019 CLINICAL  DATA:  Specimen radiograph status post right breast lumpectomy. EXAM: SPECIMEN RADIOGRAPH OF THE RIGHT BREAST COMPARISON:  Previous exam(s). FINDINGS: Status post excision of the right breast. The 3 radioactive seeds and 3 biopsy marker clips are present and completely intact. These findings were communicated with the OR at 8:26 a.m. IMPRESSION: Specimen radiograph of the right breast. Electronically Signed   By: Ammie Ferrier M.D.   On: 01/13/2019 08:28   Mm Rt Radioactive Seed Loc Mammo Guide  Result Date: 01/12/2019 CLINICAL DATA:  The patient presents for radioactive seed localization of 3 sites within the RIGHT breast for 3 sites of ductal carcinoma in situ in the [place]. EXAM: MAMMOGRAPHIC GUIDED RADIOACTIVE SEED LOCALIZATION OF THE RIGHT BREAST x3 COMPARISON:  10/14/2018 FINDINGS: Patient presents for radioactive seed localization prior to lumpectomy. I met with the patient and we discussed the procedure of seed localization including benefits and alternatives. We discussed the high likelihood of a successful procedure. We discussed the risks of the procedure including infection, bleeding, tissue injury and further surgery. We discussed the low dose of radioactivity involved in the procedure. Informed, written consent was given. The usual time-out protocol was performed immediately prior to the procedure. Radioactive seed 1: Using mammographic guidance, sterile technique, 1% lidocaine and an I-125 radioactive seed, the X shaped clip in the LOWER OUTER QUADRANT of the RIGHT breast was localized using a LATERAL approach. The follow-up mammogram images confirm the seed in the expected location and were marked for Dr. Brantley Stage. Follow-up survey of the patient confirms presence of the radioactive seed. Order number of I-125 seed:  [id number]. Total activity:  8.453 millicuries reference Date: 12/18/2018 Radioactive seed 2: Using mammographic guidance, sterile technique, 1% lidocaine and an I-125  radioactive seed, the coil shaped clip in the LOWER OUTER QUADRANT of the RIGHT breast was localized using a LATERAL approach. The follow-up mammogram  images confirm the seed in the expected location and were marked for Dr. Bary Castilla at. Follow-up survey of the patient confirms presence of the radioactive seed. Order number of I-125 seed:  [id number]. Total activity:  1.470 millicuries reference Date: 12/18/2018 Radioactive seed 3: Using mammographic guidance, sterile technique, 1% lidocaine and an I-125 radioactive seed, the ribbon shaped clip in the posterior LOWER OUTER QUADRANT of the RIGHT breast was localized using a LATERAL approach. The follow-up mammogram images confirm the seed in the expected location and were marked for Dr. Crissie Sickles at. Follow-up survey of the patient confirms presence of the radioactive seed. Order number of I-125 seed:  [id number]. Total activity:  0.370 millicuries reference Date: 12/18/2018 The patient tolerated the procedure well and was released from the [facility]. She was given instructions regarding seed removal. IMPRESSION: Radioactive seed localization right breast. No apparent complications. Electronically Signed   By: Nolon Nations M.D.   On: 01/12/2019 15:41   Mm Rt Radio Seed Ea Add Lesion Loc Mammo  Result Date: 01/12/2019 CLINICAL DATA:  The patient presents for radioactive seed localization of 3 sites within the RIGHT breast for 3 sites of ductal carcinoma in situ in the [place]. EXAM: MAMMOGRAPHIC GUIDED RADIOACTIVE SEED LOCALIZATION OF THE RIGHT BREAST x3 COMPARISON:  10/14/2018 FINDINGS: Patient presents for radioactive seed localization prior to lumpectomy. I met with the patient and we discussed the procedure of seed localization including benefits and alternatives. We discussed the high likelihood of a successful procedure. We discussed the risks of the procedure including infection, bleeding, tissue injury and further surgery. We discussed the low  dose of radioactivity involved in the procedure. Informed, written consent was given. The usual time-out protocol was performed immediately prior to the procedure. Radioactive seed 1: Using mammographic guidance, sterile technique, 1% lidocaine and an I-125 radioactive seed, the X shaped clip in the LOWER OUTER QUADRANT of the RIGHT breast was localized using a LATERAL approach. The follow-up mammogram images confirm the seed in the expected location and were marked for Dr. Brantley Stage. Follow-up survey of the patient confirms presence of the radioactive seed. Order number of I-125 seed:  [id number]. Total activity:  4.037 millicuries reference Date: 12/18/2018 Radioactive seed 2: Using mammographic guidance, sterile technique, 1% lidocaine and an I-125 radioactive seed, the coil shaped clip in the LOWER OUTER QUADRANT of the RIGHT breast was localized using a LATERAL approach. The follow-up mammogram images confirm the seed in the expected location and were marked for Dr. Bary Castilla at. Follow-up survey of the patient confirms presence of the radioactive seed. Order number of I-125 seed:  [id number]. Total activity:  3.403 millicuries reference Date: 12/18/2018 Radioactive seed 3: Using mammographic guidance, sterile technique, 1% lidocaine and an I-125 radioactive seed, the ribbon shaped clip in the posterior LOWER OUTER QUADRANT of the RIGHT breast was localized using a LATERAL approach. The follow-up mammogram images confirm the seed in the expected location and were marked for Dr. Crissie Sickles at. Follow-up survey of the patient confirms presence of the radioactive seed. Order number of I-125 seed:  [id number]. Total activity:  9.311 millicuries reference Date: 12/18/2018 The patient tolerated the procedure well and was released from the [facility]. She was given instructions regarding seed removal. IMPRESSION: Radioactive seed localization right breast. No apparent complications. Electronically Signed   By: Nolon Nations  M.D.   On: 01/12/2019 15:41   Mm Rt Radio Seed Ea Add Lesion Loc Mammo  Result Date: 01/12/2019 CLINICAL DATA:  The patient presents  for radioactive seed localization of 3 sites within the RIGHT breast for 3 sites of ductal carcinoma in situ in the [place]. EXAM: MAMMOGRAPHIC GUIDED RADIOACTIVE SEED LOCALIZATION OF THE RIGHT BREAST x3 COMPARISON:  10/14/2018 FINDINGS: Patient presents for radioactive seed localization prior to lumpectomy. I met with the patient and we discussed the procedure of seed localization including benefits and alternatives. We discussed the high likelihood of a successful procedure. We discussed the risks of the procedure including infection, bleeding, tissue injury and further surgery. We discussed the low dose of radioactivity involved in the procedure. Informed, written consent was given. The usual time-out protocol was performed immediately prior to the procedure. Radioactive seed 1: Using mammographic guidance, sterile technique, 1% lidocaine and an I-125 radioactive seed, the X shaped clip in the LOWER OUTER QUADRANT of the RIGHT breast was localized using a LATERAL approach. The follow-up mammogram images confirm the seed in the expected location and were marked for Dr. Brantley Stage. Follow-up survey of the patient confirms presence of the radioactive seed. Order number of I-125 seed:  [id number]. Total activity:  9.735 millicuries reference Date: 12/18/2018 Radioactive seed 2: Using mammographic guidance, sterile technique, 1% lidocaine and an I-125 radioactive seed, the coil shaped clip in the LOWER OUTER QUADRANT of the RIGHT breast was localized using a LATERAL approach. The follow-up mammogram images confirm the seed in the expected location and were marked for Dr. Bary Castilla at. Follow-up survey of the patient confirms presence of the radioactive seed. Order number of I-125 seed:  [id number]. Total activity:  3.419 millicuries reference Date: 12/18/2018 Radioactive seed 3:  Using mammographic guidance, sterile technique, 1% lidocaine and an I-125 radioactive seed, the ribbon shaped clip in the posterior LOWER OUTER QUADRANT of the RIGHT breast was localized using a LATERAL approach. The follow-up mammogram images confirm the seed in the expected location and were marked for Dr. Crissie Sickles at. Follow-up survey of the patient confirms presence of the radioactive seed. Order number of I-125 seed:  [id number]. Total activity:  2.119 millicuries reference Date: 12/18/2018 The patient tolerated the procedure well and was released from the [place] [facility]. She was given instructions regarding seed removal. IMPRESSION: Radioactive seed localization right breast. No apparent complications. Electronically Signed   By: Nolon Nations M.D.   On: 01/12/2019 15:41     ELIGIBLE FOR AVAILABLE RESEARCH PROTOCOL: no   ASSESSMENT: 43 y.o. 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 The STAT Breast cancer panel offered by [facility] includes sequencing and rearrangement analysis for the following 9 genes:  ATM, BRCA1, BRCA2, CDH1, CHEK2, PALB2, PTEN, STK11 and TP53. The Common Hereditary Cancers Panel offered by [facility] includes sequencing and/or deletion duplication testing of the following 47 genes: APC, ATM, AXIN2, BARD1, BMPR1A, BRCA1, BRCA2, BRIP1, CDH1, CDKN2A (p14ARF), CDKN2A (p16INK4a), CKD4, CHEK2,  CTNNA1, DICER1, EPCAM (Deletion/duplication testing only), GREM1 (promoter region deletion/duplication testing only), KIT, MEN1, MLH1, MSH2, MSH3, MSH6, MUTYH, NBN, NF1, NHTL1, PALB2, PDGFRA, PMS2, POLD1, POLE, PTEN,  RAD50, RAD51C, RAD51D, SDHB, SDHC, SDHD, SMAD4, SMARCA4. STK11, TP53, TSC1, TSC2, and VHL.  The following genes were evaluated for sequence changes only: SDHA and HOXB13 c.251G>A variant only.  (2) right lumpectomy 01/13/2019 showed ductal carcinoma in situ, grade 3, measuring 4 cm, with negative resection margins.  (3) adjuvant radiation pending  (4) to start tamoxifen 05/07/2019   PLAN: [name] did well with her surgery and is very pleased with the cosmetic result.  She is now ready to proceed to radiation and that has already been scheduled.    We discussed antiestrogen prophylaxis.  Part of her tumor was triple negative so that would not be affected.  Part of it was strongly estrogen receptor positive and that section of the tumor would benefit from tamoxifen.  Importantly though tamoxifen will cut in half her risk of developing another breast cancer in the future.  That risk otherwise approaches 1 %/year.  Tamoxifen works very well with the [place] IUD.  It makes it safe and the IUD in turn I cut down on the risk of developing endometrial polyps or endometrial thickening or even endometrial cancer.  Accordingly once she is done with radiation and recovered from that we will start tamoxifen.  Target start date is October 1.  She will see me a couple of months later.  If she tolerates tamoxifen well the plan will be to continue that a total of 5 years  I have encouraged her to exercise more  She knows to call for any other issue that may develop before the next visit.   Marly Schuld, [name], MD  02/09/19 11:23 AM Medical Oncology and Hematology [facility] [address] [place], [place] [address] Tel. [phone]    Fax. [phone]   I, Jacqualyn Posey am  acting as a Education administrator for Chauncey Cruel, MD.   I, Lurline Del MD, have reviewed the above documentation for accuracy and completeness, and I agree with the above.

## 2019-02-10 ENCOUNTER — Telehealth: Payer: Self-pay | Admitting: Oncology

## 2019-02-10 NOTE — Telephone Encounter (Signed)
I talk with patient regarding schedule  

## 2019-02-11 ENCOUNTER — Other Ambulatory Visit: Payer: Self-pay

## 2019-02-11 ENCOUNTER — Ambulatory Visit
Admission: RE | Admit: 2019-02-11 | Discharge: 2019-02-11 | Disposition: A | Discharge status: Home / Self Care | Payer: BC Managed Care – PPO | Source: Ambulatory Visit | Attending: Radiation Oncology | Admitting: Radiation Oncology

## 2019-02-11 ENCOUNTER — Encounter: Payer: Self-pay | Admitting: Radiation Oncology

## 2019-02-11 VITALS — Ht 63.0 in | Wt 156.0 lb

## 2019-02-11 DIAGNOSIS — Z9889 Other specified postprocedural states: Secondary | ICD-10-CM | POA: Diagnosis not present

## 2019-02-11 DIAGNOSIS — D0511 Intraductal carcinoma in situ of right breast: Secondary | ICD-10-CM | POA: Diagnosis not present

## 2019-02-11 DIAGNOSIS — C50511 Malignant neoplasm of lower-outer quadrant of right female breast: Secondary | ICD-10-CM

## 2019-02-11 DIAGNOSIS — Z803 Family history of malignant neoplasm of breast: Secondary | ICD-10-CM | POA: Diagnosis not present

## 2019-02-11 DIAGNOSIS — Z171 Estrogen receptor negative status [ER-]: Secondary | ICD-10-CM

## 2019-02-11 HISTORY — DX: Malignant neoplasm of unspecified site of unspecified female breast: C50.919

## 2019-02-11 NOTE — Progress Notes (Signed)
Radiation Oncology         [phone] ________________________________  Name: [name]        MRN: [id number]  Date of Service: 02/11/2019 DOB: 1975/09/29  NW:GNFAO Chase, Alferd Apa, DO  [name], [name], MD     REFERRING PHYSICIAN: [name], [name], MD   DIAGNOSIS: The primary encounter diagnosis was Malignant neoplasm of lower-outer quadrant of right breast of female, estrogen receptor negative ([place]). A diagnosis of Ductal carcinoma in situ (DCIS) of right breast was also pertinent to this visit.   HISTORY OF PRESENT ILLNESS: [name] is a 43 y.o. female seen in the multidisciplinary breast clinic for a new diagnosis of right breast cancer. The patient was noted to have screening detected calcifications of bilateral breasts. She underwent diagnostic imaging that revealed a span of 5 x 2.8 x 1.7 cm grouping of calcifications in the right breast in the lower outer quadrant and no axillary ultrasound was performed. She also had an 8 mm grouping of calcifications in the upper outer quadrant of the left breast. 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 She underwent a bracketed lumpectomy on 01/13/2019 that revealed a 4 cm area of high grade DCIS. She had a close margin <1 mm at the lateral and posterior aspects of the resection. She did meet with Dr. Brantley Stage and no additional surgery is recommended. She has seen Dr. Jana Hakim and he has recommended Tamoxifen as a prophylaxis given her young age. She is seen today via Webex to proceed with adjuvant radiotherapy.  PREVIOUS RADIATION THERAPY: No   PAST MEDICAL HISTORY:  Past Medical History:  Diagnosis Date   Anemia    Back pain    Bimalleolar ankle fracture    left   Breast cancer (HCC)    Family history of breast cancer    GERD (gastroesophageal reflux disease)    Hypertension    no meds    Kidney infection    1998- hosp.- [facility]       PAST SURGICAL HISTORY: Past Surgical History:  Procedure Laterality Date   ANTERIOR LUMBAR FUSION  11/14/2011   Procedure: ANTERIOR LUMBAR FUSION 2 LEVELS;  Surgeon: Johnn Hai, MD;  Location: [facility];  Service: Orthopedics;  Laterality: N/A;  ALIF L5-S1   BACK SURGERY     BREAST LUMPECTOMY WITH RADIOACTIVE SEED LOCALIZATION Right 01/13/2019   Procedure: RIGHT BREAST LUMPECTOMY WITH RADIOACTIVE SEED LOCALIZATION X3;  Surgeon: Erroll Luna, MD;  Location: [facility];  Service: General;  Laterality: Right;   CESAREAN SECTION     2007- /w epidural  anesthesia    COLONOSCOPY     LUMBAR LAMINECTOMY/DECOMPRESSION MICRODISCECTOMY Right 12/15/2015   Procedure:  MICO LUMBER DECOMPRESSION L4-5 ON THE RIGHT, ;  Surgeon: Susa Day, MD;  Location: [facility];  Service: Orthopedics;  Laterality: Right;   ORIF ANKLE FRACTURE Left 10/03/2017   Procedure: OPEN REDUCTION INTERNAL FIXATION (ORIF) ANKLE BIMALLEOLAR FRACTURE; possible deltoid ligament repair;  Surgeon: Wylene Simmer, MD;  Location: [facility];  Service: Orthopedics;  Laterality: Left;   SPINE SURGERY     fusion   UPPER GASTROINTESTINAL ENDOSCOPY     WISDOM TOOTH EXTRACTION     young adult     FAMILY HISTORY:  Family History  Problem Relation Age of Onset   Lupus Mother    Breast cancer Maternal Aunt  Anesthesia problems Neg Hx    Colon cancer Neg Hx    Colon polyps Neg Hx    Esophageal cancer Neg Hx    Rectal cancer Neg Hx    Stomach cancer Neg Hx      SOCIAL HISTORY:  reports that she quit smoking about 22 months ago. Her smoking use included e-cigarettes and cigarettes. She has a 3.75 pack-year smoking history. She has never used smokeless tobacco. She reports current alcohol use. She reports that she does not use drugs. The patient is married and lives in [place]. She has a Mirena IUD.    ALLERGIES: Tamiflu [oseltamivir  phosphate], Ciprofloxacin, Metoclopramide hcl, and Sulfa antibiotics   MEDICATIONS:  Current Outpatient Medications  Medication Sig Dispense Refill   levonorgestrel (MIRENA) 20 MCG/24HR IUD 1 each by Intrauterine route once.     omeprazole (PRILOSEC) 40 MG capsule TAKE 1 CAPSULE BY MOUTH TWICE A DAY 60 capsule 11   ibuprofen (ADVIL) 800 MG tablet Take 1 tablet (800 mg total) by mouth every 8 (eight) hours as needed. (Patient not taking: Reported on 02/11/2019) 30 tablet 0   tamoxifen (NOLVADEX) 20 MG tablet Take 1 tablet (20 mg total) by mouth daily. (Patient not taking: Reported on 02/11/2019) 90 tablet 12   valACYclovir (VALTREX) 1000 MG tablet Take 1 tablet (1,000 mg total) by mouth 3 (three) times daily as needed. For flare ups (Patient not taking: Reported on 02/11/2019) 90 tablet 2   No current facility-administered medications for this encounter.      REVIEW OF SYSTEMS: On review of systems, the patient reports that she is doing well overall. She denies any chest pain, shortness of breath, cough, fevers, chills, night sweats, unintended weight changes. She denies any bowel or bladder disturbances, and denies abdominal pain, nausea or vomiting. She reports some mild discomfort following surgery but denies any new musculoskeletal or joint aches or pains, new skin lesions or concerns. A complete review of systems is obtained and is otherwise negative.      PHYSICAL EXAM:  Wt Readings from Last 3 Encounters:  02/11/19 156 lb (70.8 kg)  02/09/19 156 lb 1.6 oz (70.8 kg)  01/13/19 154 lb 8.7 oz (70.1 kg)   Temp Readings from Last 3 Encounters:  02/09/19 98 F (36.7 C) (Oral)  01/13/19 99.2 F (37.3 C) (Oral)  09/24/18 99 F (37.2 C) (Oral)   BP Readings from Last 3 Encounters:  02/09/19 (!) 157/102  01/13/19 (!) 151/100  09/24/18 (!) 141/92   Pulse Readings from Last 3 Encounters:  02/09/19 99  01/13/19 85  09/24/18 92     In general this is a well appearing caucasian  female in no acute distress. She is alert and oriented x4 and appropriate throughout the examination. HEENT reveals that the patient is normocephalic, atraumatic. Skin is intact without any evidence of gross lesions.Cardiopulmonary assessment is negative for acute distress and she exhibits normal effort. Breast exam is deferred.   ECOG = 0  0 - Asymptomatic (Fully active, able to carry on all predisease activities without restriction)  1 - Symptomatic but completely ambulatory (Restricted in physically strenuous activity but ambulatory and able to carry out work of a light or sedentary nature. For example, light housework, office work)  2 - Symptomatic, <50% in bed during the day (Ambulatory and capable of all self care but unable to carry out any work activities. Up and about more than 50% of waking hours)  3 - Symptomatic, >50% in bed, but not  bedbound (Capable of only limited self-care, confined to bed or chair 50% or more of waking hours)  4 - Bedbound (Completely disabled. Cannot carry on any self-care. Totally confined to bed or chair)  5 - Death   Eustace Pen MM, Creech RH, Tormey DC, et al. [phone]). "Toxicity and response criteria of the [facility]". [place] Oncol. 5 (6): 649-55    LABORATORY DATA:  Lab Results  Component Value Date   WBC 4.8 01/09/2019   HGB 13.9 01/09/2019   HCT 41.0 01/09/2019   MCV 92.6 01/09/2019   PLT 182 01/09/2019   Lab Results  Component Value Date   NA 136 01/09/2019   K 3.5 01/09/2019   CL 99 01/09/2019   CO2 24 01/09/2019   Lab Results  Component Value Date   ALT 150 (H) 01/09/2019   AST 157 (H) 01/09/2019   ALKPHOS 39 01/09/2019   BILITOT 1.1 01/09/2019      RADIOGRAPHY: Mm Breast Surgical Specimen  Result Date: 01/13/2019 CLINICAL DATA:  Specimen radiograph status post right breast lumpectomy. EXAM: SPECIMEN RADIOGRAPH OF THE RIGHT BREAST COMPARISON:  Previous exam(s). FINDINGS: Status post excision of the  right breast. The 3 radioactive seeds and 3 biopsy marker clips are present and completely intact. These findings were communicated with the OR at 8:26 a.m. IMPRESSION: Specimen radiograph of the right breast. Electronically Signed   By: Ammie Ferrier M.D.   On: 01/13/2019 08:28   Mm Rt Radioactive Seed Loc Mammo Guide  Result Date: 01/12/2019 CLINICAL DATA:  The patient presents for radioactive seed localization of 3 sites within the RIGHT breast for 3 sites of ductal carcinoma in situ in the [place]. EXAM: MAMMOGRAPHIC GUIDED RADIOACTIVE SEED LOCALIZATION OF THE RIGHT BREAST x3 COMPARISON:  10/14/2018 FINDINGS: Patient presents for radioactive seed localization prior to lumpectomy. I met with the patient and we discussed the procedure of seed localization including benefits and alternatives. We discussed the high likelihood of a successful procedure. We discussed the risks of the procedure including infection, bleeding, tissue injury and further surgery. We discussed the low dose of radioactivity involved in the procedure. Informed, written consent was given. The usual time-out protocol was performed immediately prior to the procedure. Radioactive seed 1: Using mammographic guidance, sterile technique, 1% lidocaine and an I-125 radioactive seed, the X shaped clip in the LOWER OUTER QUADRANT of the RIGHT breast was localized using a LATERAL approach. The follow-up mammogram images confirm the seed in the expected location and were marked for Dr. Brantley Stage. Follow-up survey of the patient confirms presence of the radioactive seed. Order number of I-125 seed:  [id number]. Total activity:  3.545 millicuries reference Date: 12/18/2018 Radioactive seed 2: Using mammographic guidance, sterile technique, 1% lidocaine and an I-125 radioactive seed, the coil shaped clip in the LOWER OUTER QUADRANT of the RIGHT breast was localized using a LATERAL approach. The follow-up mammogram images confirm the seed in  the expected location and were marked for Dr. Bary Castilla at. Follow-up survey of the patient confirms presence of the radioactive seed. Order number of I-125 seed:  [id number]. Total activity:  3.428 millicuries reference Date: 12/18/2018 Radioactive seed 3: Using mammographic guidance, sterile technique, 1% lidocaine and an I-125 radioactive seed, the ribbon shaped clip in the posterior LOWER OUTER QUADRANT of the RIGHT breast was localized using a LATERAL approach. The follow-up mammogram images confirm the seed in the expected location and were marked for Dr. Crissie Sickles at. Follow-up survey of the patient confirms  presence of the radioactive seed. Order number of I-125 seed:  [id number]. Total activity:  8.469 millicuries reference Date: 12/18/2018 The patient tolerated the procedure well and was released from the [facility]. She was given instructions regarding seed removal. IMPRESSION: Radioactive seed localization right breast. No apparent complications. Electronically Signed   By: Nolon Nations M.D.   On: 01/12/2019 15:41   Mm Rt Radio Seed Ea Add Lesion Loc Mammo  Result Date: 01/12/2019 CLINICAL DATA:  The patient presents for radioactive seed localization of 3 sites within the RIGHT breast for 3 sites of ductal carcinoma in situ in the [place]. EXAM: MAMMOGRAPHIC GUIDED RADIOACTIVE SEED LOCALIZATION OF THE RIGHT BREAST x3 COMPARISON:  10/14/2018 FINDINGS: Patient presents for radioactive seed localization prior to lumpectomy. I met with the patient and we discussed the procedure of seed localization including benefits and alternatives. We discussed the high likelihood of a successful procedure. We discussed the risks of the procedure including infection, bleeding, tissue injury and further surgery. We discussed the low dose of radioactivity involved in the procedure. Informed, written consent was given. The usual time-out protocol was performed immediately prior to the procedure. Radioactive  seed 1: Using mammographic guidance, sterile technique, 1% lidocaine and an I-125 radioactive seed, the X shaped clip in the LOWER OUTER QUADRANT of the RIGHT breast was localized using a LATERAL approach. The follow-up mammogram images confirm the seed in the expected location and were marked for Dr. Brantley Stage. Follow-up survey of the patient confirms presence of the radioactive seed. Order number of I-125 seed:  [id number]. Total activity:  2.440 millicuries reference Date: 12/18/2018 Radioactive seed 2: Using mammographic guidance, sterile technique, 1% lidocaine and an I-125 radioactive seed, the coil shaped clip in the LOWER OUTER QUADRANT of the RIGHT breast was localized using a LATERAL approach. The follow-up mammogram images confirm the seed in the expected location and were marked for Dr. Bary Castilla at. Follow-up survey of the patient confirms presence of the radioactive seed. Order number of I-125 seed:  [id number]. Total activity:  4.403 millicuries reference Date: 12/18/2018 Radioactive seed 3: Using mammographic guidance, sterile technique, 1% lidocaine and an I-125 radioactive seed, the ribbon shaped clip in the posterior LOWER OUTER QUADRANT of the RIGHT breast was localized using a LATERAL approach. The follow-up mammogram images confirm the seed in the expected location and were marked for Dr. Crissie Sickles at. Follow-up survey of the patient confirms presence of the radioactive seed. Order number of I-125 seed:  [id number]. Total activity:  8.756 millicuries reference Date: 12/18/2018 The patient tolerated the procedure well and was released from the [facility]. She was given instructions regarding seed removal. IMPRESSION: Radioactive seed localization right breast. No apparent complications. Electronically Signed   By: Nolon Nations M.D.   On: 01/12/2019 15:41   Mm Rt Radio Seed Ea Add Lesion Loc Mammo  Result Date: 01/12/2019 CLINICAL DATA:  The patient presents for radioactive seed localization of 3  sites within the RIGHT breast for 3 sites of ductal carcinoma in situ in the [place]. EXAM: MAMMOGRAPHIC GUIDED RADIOACTIVE SEED LOCALIZATION OF THE RIGHT BREAST x3 COMPARISON:  10/14/2018 FINDINGS: Patient presents for radioactive seed localization prior to lumpectomy. I met with the patient and we discussed the procedure of seed localization including benefits and alternatives. We discussed the high likelihood of a successful procedure. We discussed the risks of the procedure including infection, bleeding, tissue injury and further surgery. We discussed the low dose of radioactivity involved in the procedure.  Informed, written consent was given. The usual time-out protocol was performed immediately prior to the procedure. Radioactive seed 1: Using mammographic guidance, sterile technique, 1% lidocaine and an I-125 radioactive seed, the X shaped clip in the LOWER OUTER QUADRANT of the RIGHT breast was localized using a LATERAL approach. The follow-up mammogram images confirm the seed in the expected location and were marked for Dr. Brantley Stage. Follow-up survey of the patient confirms presence of the radioactive seed. Order number of I-125 seed:  [id number]. Total activity:  3.267 millicuries reference Date: 12/18/2018 Radioactive seed 2: Using mammographic guidance, sterile technique, 1% lidocaine and an I-125 radioactive seed, the coil shaped clip in the LOWER OUTER QUADRANT of the RIGHT breast was localized using a LATERAL approach. The follow-up mammogram images confirm the seed in the expected location and were marked for Dr. Bary Castilla at. Follow-up survey of the patient confirms presence of the radioactive seed. Order number of I-125 seed:  [id number]. Total activity:  3.382 millicuries reference Date: 12/18/2018 Radioactive seed 3: Using mammographic guidance, sterile technique, 1% lidocaine and an I-125 radioactive seed, the ribbon shaped clip in the posterior LOWER OUTER QUADRANT of the RIGHT breast  was localized using a LATERAL approach. The follow-up mammogram images confirm the seed in the expected location and were marked for Dr. Crissie Sickles at. Follow-up survey of the patient confirms presence of the radioactive seed. Order number of I-125 seed:  [id number]. Total activity:  4.193 millicuries reference Date: 12/18/2018 The patient tolerated the procedure well and was released from the [facility]. She was given instructions regarding seed removal. IMPRESSION: Radioactive seed localization right breast. No apparent complications. Electronically Signed   By: Nolon Nations M.D.   On: 01/12/2019 15:41       IMPRESSION/PLAN: 1. 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 Dr. Lisbeth Renshaw discusses the final pathology findings and reviews the nature of noninvasive breast disease. He reviews the rationale to proceed with radiotherapy.  We discussed the risks, benefits, short, and long term effects of radiotherapy. Dr. Lisbeth Renshaw discusses the delivery and logistics of radiotherapy and recommends a course of 6 1/2 weeks of radiotherapy. She is scheduled to proceed with simulation tomorrow morning at 11 am. We will sign written consent at that time.  2. Contraceptive counseling. The patient has an in situ Mirena IUD. We do not anticipate a need for Hcg testing. She is aware of the rationale to avoid pregnancy during treatment.   This encounter was provided by telemedicine platform Webex.  The patient has given verbal consent for this type of encounter and has been advised to only accept a meeting of this type in a secure network environment. The time spent during this encounter was 25 minutes. The attendants for this meeting include Joaquim Lai, RN, Dr. Lisbeth Renshaw, Hayden Pedro  and Robby Sermon Lebec.  During the encounter,  Joaquim Lai, RN, Dr. Lisbeth Renshaw, and Hayden Pedro were located at [facility] Radiation Oncology Department.  Kent Braunschweig Adorno was located  at home.    The above documentation reflects my direct findings during this shared patient visit. Please see the separate note by Dr. Lisbeth Renshaw on this date for the remainder of the patient's plan of care.    Carola Rhine, PAC

## 2019-02-11 NOTE — Progress Notes (Signed)
See progress note under physician encounter. 

## 2019-02-12 ENCOUNTER — Ambulatory Visit
Admission: RE | Admit: 2019-02-12 | Discharge: 2019-02-12 | Disposition: A | Discharge status: Home / Self Care | Payer: BC Managed Care – PPO | Source: Ambulatory Visit | Attending: Radiation Oncology | Admitting: Radiation Oncology

## 2019-02-12 ENCOUNTER — Other Ambulatory Visit: Payer: Self-pay

## 2019-02-12 DIAGNOSIS — Z171 Estrogen receptor negative status [ER-]: Secondary | ICD-10-CM | POA: Insufficient documentation

## 2019-02-12 DIAGNOSIS — Z510 Encounter for antineoplastic radiation therapy: Secondary | ICD-10-CM | POA: Insufficient documentation

## 2019-02-12 DIAGNOSIS — D0511 Intraductal carcinoma in situ of right breast: Secondary | ICD-10-CM | POA: Insufficient documentation

## 2019-02-18 DIAGNOSIS — D0511 Intraductal carcinoma in situ of right breast: Secondary | ICD-10-CM | POA: Diagnosis not present

## 2019-02-18 DIAGNOSIS — Z171 Estrogen receptor negative status [ER-]: Secondary | ICD-10-CM | POA: Diagnosis not present

## 2019-02-18 DIAGNOSIS — Z510 Encounter for antineoplastic radiation therapy: Secondary | ICD-10-CM | POA: Diagnosis not present

## 2019-02-23 ENCOUNTER — Ambulatory Visit
Admission: RE | Admit: 2019-02-23 | Discharge: 2019-02-23 | Disposition: A | Discharge status: Home / Self Care | Payer: BC Managed Care – PPO | Source: Ambulatory Visit | Attending: Radiation Oncology | Admitting: Radiation Oncology

## 2019-02-23 ENCOUNTER — Other Ambulatory Visit: Payer: Self-pay

## 2019-02-23 DIAGNOSIS — Z171 Estrogen receptor negative status [ER-]: Secondary | ICD-10-CM | POA: Diagnosis not present

## 2019-02-23 DIAGNOSIS — D0511 Intraductal carcinoma in situ of right breast: Secondary | ICD-10-CM | POA: Diagnosis not present

## 2019-02-23 DIAGNOSIS — Z510 Encounter for antineoplastic radiation therapy: Secondary | ICD-10-CM | POA: Diagnosis not present

## 2019-02-24 ENCOUNTER — Ambulatory Visit
Admission: RE | Admit: 2019-02-24 | Discharge: 2019-02-24 | Disposition: A | Discharge status: Home / Self Care | Payer: BC Managed Care – PPO | Source: Ambulatory Visit | Attending: Radiation Oncology | Admitting: Radiation Oncology

## 2019-02-24 DIAGNOSIS — D0511 Intraductal carcinoma in situ of right breast: Secondary | ICD-10-CM | POA: Diagnosis not present

## 2019-02-24 DIAGNOSIS — Z171 Estrogen receptor negative status [ER-]: Secondary | ICD-10-CM | POA: Diagnosis not present

## 2019-02-24 DIAGNOSIS — Z510 Encounter for antineoplastic radiation therapy: Secondary | ICD-10-CM | POA: Diagnosis not present

## 2019-02-25 ENCOUNTER — Other Ambulatory Visit: Payer: Self-pay

## 2019-02-25 ENCOUNTER — Ambulatory Visit
Admission: RE | Admit: 2019-02-25 | Discharge: 2019-02-25 | Disposition: A | Discharge status: Home / Self Care | Payer: BC Managed Care – PPO | Source: Ambulatory Visit | Attending: Radiation Oncology | Admitting: Radiation Oncology

## 2019-02-25 DIAGNOSIS — Z510 Encounter for antineoplastic radiation therapy: Secondary | ICD-10-CM | POA: Diagnosis not present

## 2019-02-25 DIAGNOSIS — D0511 Intraductal carcinoma in situ of right breast: Secondary | ICD-10-CM | POA: Diagnosis not present

## 2019-02-25 DIAGNOSIS — Z171 Estrogen receptor negative status [ER-]: Secondary | ICD-10-CM | POA: Diagnosis not present

## 2019-02-26 ENCOUNTER — Ambulatory Visit
Admission: RE | Admit: 2019-02-26 | Discharge: 2019-02-26 | Disposition: A | Discharge status: Home / Self Care | Payer: BC Managed Care – PPO | Source: Ambulatory Visit | Attending: Radiation Oncology | Admitting: Radiation Oncology

## 2019-02-26 DIAGNOSIS — D0511 Intraductal carcinoma in situ of right breast: Secondary | ICD-10-CM | POA: Diagnosis not present

## 2019-02-26 DIAGNOSIS — Z510 Encounter for antineoplastic radiation therapy: Secondary | ICD-10-CM | POA: Diagnosis not present

## 2019-02-26 DIAGNOSIS — Z171 Estrogen receptor negative status [ER-]: Secondary | ICD-10-CM | POA: Diagnosis not present

## 2019-02-27 ENCOUNTER — Ambulatory Visit
Admission: RE | Admit: 2019-02-27 | Discharge: 2019-02-27 | Disposition: A | Discharge status: Home / Self Care | Payer: BC Managed Care – PPO | Source: Ambulatory Visit | Attending: Radiation Oncology | Admitting: Radiation Oncology

## 2019-02-27 DIAGNOSIS — D0511 Intraductal carcinoma in situ of right breast: Secondary | ICD-10-CM | POA: Diagnosis not present

## 2019-02-27 DIAGNOSIS — Z510 Encounter for antineoplastic radiation therapy: Secondary | ICD-10-CM | POA: Diagnosis not present

## 2019-02-27 DIAGNOSIS — Z171 Estrogen receptor negative status [ER-]: Secondary | ICD-10-CM | POA: Diagnosis not present

## 2019-02-27 MED ORDER — RADIAPLEXRX EX GEL
Freq: Once | CUTANEOUS | Status: AC
  Administered 2019-02-27: 17:00:00 via TOPICAL

## 2019-02-27 MED ORDER — ALRA NON-METALLIC DEODORANT (RAD-ONC)
1.0000 "application " | Freq: Once | TOPICAL | Status: AC
  Administered 2019-02-27: 1 via TOPICAL

## 2019-02-27 NOTE — Progress Notes (Signed)
Pt here for patient teaching.  Pt given Radiation and You booklet, skin care instructions, Alra deodorant and Radiaplex gel.  Reviewed areas of pertinence such as fatigue, hair loss, skin changes, breast tenderness and breast swelling . Pt able to give teach back of to pat skin and use unscented/gentle soap,apply Radiaplex bid, avoid applying anything to skin within 4 hours of treatment, avoid wearing an under wire bra and to use an electric razor if they must shave. Pt verbalizes understanding of information given and will contact nursing with any questions or concerns.     Ameen Mostafa M. Shalina Norfolk RN, BSN      

## 2019-03-02 ENCOUNTER — Ambulatory Visit
Admission: RE | Admit: 2019-03-02 | Discharge: 2019-03-02 | Disposition: A | Discharge status: Home / Self Care | Payer: BC Managed Care – PPO | Source: Ambulatory Visit | Attending: Radiation Oncology | Admitting: Radiation Oncology

## 2019-03-02 DIAGNOSIS — Z510 Encounter for antineoplastic radiation therapy: Secondary | ICD-10-CM | POA: Diagnosis not present

## 2019-03-02 DIAGNOSIS — Z171 Estrogen receptor negative status [ER-]: Secondary | ICD-10-CM | POA: Diagnosis not present

## 2019-03-02 DIAGNOSIS — D0511 Intraductal carcinoma in situ of right breast: Secondary | ICD-10-CM | POA: Diagnosis not present

## 2019-03-03 ENCOUNTER — Ambulatory Visit
Admission: RE | Admit: 2019-03-03 | Discharge: 2019-03-03 | Disposition: A | Discharge status: Home / Self Care | Payer: BC Managed Care – PPO | Source: Ambulatory Visit | Attending: Radiation Oncology | Admitting: Radiation Oncology

## 2019-03-03 DIAGNOSIS — Z171 Estrogen receptor negative status [ER-]: Secondary | ICD-10-CM | POA: Diagnosis not present

## 2019-03-03 DIAGNOSIS — Z510 Encounter for antineoplastic radiation therapy: Secondary | ICD-10-CM | POA: Diagnosis not present

## 2019-03-03 DIAGNOSIS — D0511 Intraductal carcinoma in situ of right breast: Secondary | ICD-10-CM | POA: Diagnosis not present

## 2019-03-04 ENCOUNTER — Other Ambulatory Visit: Payer: Self-pay | Admitting: *Deleted

## 2019-03-04 ENCOUNTER — Other Ambulatory Visit: Payer: Self-pay

## 2019-03-04 ENCOUNTER — Ambulatory Visit
Admission: RE | Admit: 2019-03-04 | Discharge: 2019-03-04 | Disposition: A | Discharge status: Home / Self Care | Payer: BC Managed Care – PPO | Source: Ambulatory Visit | Attending: Radiation Oncology | Admitting: Radiation Oncology

## 2019-03-04 DIAGNOSIS — Z171 Estrogen receptor negative status [ER-]: Secondary | ICD-10-CM | POA: Diagnosis not present

## 2019-03-04 DIAGNOSIS — D0511 Intraductal carcinoma in situ of right breast: Secondary | ICD-10-CM | POA: Diagnosis not present

## 2019-03-04 DIAGNOSIS — Z510 Encounter for antineoplastic radiation therapy: Secondary | ICD-10-CM | POA: Diagnosis not present

## 2019-03-04 MED ORDER — FLUTICASONE PROPIONATE 50 MCG/ACT NA SUSP: NASAL | 1 refills | Status: DC

## 2019-03-05 ENCOUNTER — Ambulatory Visit
Admission: RE | Admit: 2019-03-05 | Discharge: 2019-03-05 | Disposition: A | Discharge status: Home / Self Care | Payer: BC Managed Care – PPO | Source: Ambulatory Visit | Attending: Radiation Oncology | Admitting: Radiation Oncology

## 2019-03-05 ENCOUNTER — Other Ambulatory Visit: Payer: Self-pay

## 2019-03-05 DIAGNOSIS — D0511 Intraductal carcinoma in situ of right breast: Secondary | ICD-10-CM | POA: Diagnosis not present

## 2019-03-05 DIAGNOSIS — Z510 Encounter for antineoplastic radiation therapy: Secondary | ICD-10-CM | POA: Diagnosis not present

## 2019-03-05 DIAGNOSIS — Z171 Estrogen receptor negative status [ER-]: Secondary | ICD-10-CM | POA: Diagnosis not present

## 2019-03-06 ENCOUNTER — Other Ambulatory Visit: Payer: Self-pay

## 2019-03-06 ENCOUNTER — Ambulatory Visit
Admission: RE | Admit: 2019-03-06 | Discharge: 2019-03-06 | Disposition: A | Discharge status: Home / Self Care | Payer: BC Managed Care – PPO | Source: Ambulatory Visit | Attending: Radiation Oncology | Admitting: Radiation Oncology

## 2019-03-06 DIAGNOSIS — Z510 Encounter for antineoplastic radiation therapy: Secondary | ICD-10-CM | POA: Diagnosis not present

## 2019-03-06 DIAGNOSIS — D0511 Intraductal carcinoma in situ of right breast: Secondary | ICD-10-CM | POA: Diagnosis not present

## 2019-03-06 DIAGNOSIS — Z171 Estrogen receptor negative status [ER-]: Secondary | ICD-10-CM | POA: Diagnosis not present

## 2019-03-09 ENCOUNTER — Ambulatory Visit
Admission: RE | Admit: 2019-03-09 | Discharge: 2019-03-09 | Disposition: A | Discharge status: Home / Self Care | Payer: BC Managed Care – PPO | Source: Ambulatory Visit | Attending: Radiation Oncology | Admitting: Radiation Oncology

## 2019-03-09 ENCOUNTER — Other Ambulatory Visit: Payer: Self-pay

## 2019-03-09 DIAGNOSIS — Z510 Encounter for antineoplastic radiation therapy: Secondary | ICD-10-CM | POA: Diagnosis not present

## 2019-03-09 DIAGNOSIS — Z171 Estrogen receptor negative status [ER-]: Secondary | ICD-10-CM | POA: Insufficient documentation

## 2019-03-09 DIAGNOSIS — D0511 Intraductal carcinoma in situ of right breast: Secondary | ICD-10-CM | POA: Insufficient documentation

## 2019-03-10 ENCOUNTER — Ambulatory Visit
Admission: RE | Admit: 2019-03-10 | Discharge: 2019-03-10 | Disposition: A | Discharge status: Home / Self Care | Payer: BC Managed Care – PPO | Source: Ambulatory Visit | Attending: Radiation Oncology | Admitting: Radiation Oncology

## 2019-03-10 DIAGNOSIS — Z171 Estrogen receptor negative status [ER-]: Secondary | ICD-10-CM | POA: Diagnosis not present

## 2019-03-10 DIAGNOSIS — Z510 Encounter for antineoplastic radiation therapy: Secondary | ICD-10-CM | POA: Diagnosis not present

## 2019-03-10 DIAGNOSIS — D0511 Intraductal carcinoma in situ of right breast: Secondary | ICD-10-CM | POA: Diagnosis not present

## 2019-03-11 ENCOUNTER — Ambulatory Visit
Admission: RE | Admit: 2019-03-11 | Discharge: 2019-03-11 | Disposition: A | Discharge status: Home / Self Care | Payer: BC Managed Care – PPO | Source: Ambulatory Visit | Attending: Radiation Oncology | Admitting: Radiation Oncology

## 2019-03-11 ENCOUNTER — Other Ambulatory Visit: Payer: Self-pay

## 2019-03-11 ENCOUNTER — Encounter: Payer: Self-pay | Admitting: Radiation Oncology

## 2019-03-11 DIAGNOSIS — D0511 Intraductal carcinoma in situ of right breast: Secondary | ICD-10-CM | POA: Diagnosis not present

## 2019-03-11 DIAGNOSIS — Z171 Estrogen receptor negative status [ER-]: Secondary | ICD-10-CM | POA: Diagnosis not present

## 2019-03-11 DIAGNOSIS — Z510 Encounter for antineoplastic radiation therapy: Secondary | ICD-10-CM | POA: Diagnosis not present

## 2019-03-11 NOTE — Progress Notes (Signed)
The patient was seen today following her treatment to the right breast.  She has received 13 of 25 fractions to the whole breast and has a boost as well to complete.  She started noticing feeling as though she was going to pass out today while she was eating.  She denies any recent changes to medications, or being out in the heat for any length of time.  She states that this is since resolved.  She does have the ability to check her blood pressure and has been under some stress as of late.  She reports her blood pressure has been varying, but is typically in the 130s over 80s at baseline.  Her blood pressure checked in our clinic was 158/110.  She denies any visual changes, headaches, chest pain or shortness of breath.  Her breast is examined and reveals brisk erythema and a 1 cm, and lateral to the nipple, and a 1.5 cm area as well that is medial to the nipple.  No evidence of desquamation is identified.  No pruritus is noted by the patient.  We discussed the rationale to follow-up with her primary care provider as she has been following her blood pressure and was counseled on the possibility of needing anti-hypertensive medication.  She will call them today for evaluation and is counseled on the need to be evaluated urgently if she develops acute onset of cardiopulmonary symptoms or headache/visual change.  She is also counseled on the findings from her simulation images that reveal radiopaque markings from our simulation set up in the locations of her erythema.  I have talked to her treatment staff on the next 3, and they will be mindful of this at the time of her boost and use a permanent marker rather than a radiopaque paint marker that appears to have caused the findings.  She is in agreement with this plan.  She will follow-up on Friday for her routine undertreat visit with Dr. Lisbeth Renshaw.     Carola Rhine, PAC

## 2019-03-12 ENCOUNTER — Ambulatory Visit
Admission: RE | Admit: 2019-03-12 | Discharge: 2019-03-12 | Disposition: A | Discharge status: Home / Self Care | Payer: BC Managed Care – PPO | Source: Ambulatory Visit | Attending: Radiation Oncology | Admitting: Radiation Oncology

## 2019-03-12 DIAGNOSIS — Z510 Encounter for antineoplastic radiation therapy: Secondary | ICD-10-CM | POA: Diagnosis not present

## 2019-03-12 DIAGNOSIS — Z171 Estrogen receptor negative status [ER-]: Secondary | ICD-10-CM | POA: Diagnosis not present

## 2019-03-12 DIAGNOSIS — D0511 Intraductal carcinoma in situ of right breast: Secondary | ICD-10-CM | POA: Diagnosis not present

## 2019-03-13 ENCOUNTER — Ambulatory Visit (INDEPENDENT_AMBULATORY_CARE_PROVIDER_SITE_OTHER): Payer: BC Managed Care – PPO | Admitting: Family Medicine

## 2019-03-13 ENCOUNTER — Encounter: Payer: Self-pay | Admitting: Family Medicine

## 2019-03-13 ENCOUNTER — Other Ambulatory Visit: Payer: Self-pay

## 2019-03-13 ENCOUNTER — Ambulatory Visit
Admission: RE | Admit: 2019-03-13 | Discharge: 2019-03-13 | Disposition: A | Discharge status: Home / Self Care | Payer: BC Managed Care – PPO | Source: Ambulatory Visit | Attending: Radiation Oncology | Admitting: Radiation Oncology

## 2019-03-13 ENCOUNTER — Ambulatory Visit: Payer: Self-pay | Admitting: *Deleted

## 2019-03-13 DIAGNOSIS — Z510 Encounter for antineoplastic radiation therapy: Secondary | ICD-10-CM | POA: Diagnosis not present

## 2019-03-13 DIAGNOSIS — Z171 Estrogen receptor negative status [ER-]: Secondary | ICD-10-CM | POA: Diagnosis not present

## 2019-03-13 DIAGNOSIS — D0511 Intraductal carcinoma in situ of right breast: Secondary | ICD-10-CM | POA: Diagnosis not present

## 2019-03-13 DIAGNOSIS — I10 Essential (primary) hypertension: Secondary | ICD-10-CM

## 2019-03-13 MED ORDER — LOSARTAN POTASSIUM 50 MG PO TABS: 50.0000 mg | ORAL_TABLET | Freq: Every day | ORAL | 3 refills | Status: DC

## 2019-03-13 NOTE — Progress Notes (Signed)
Chief Complaint  Patient presents with  . Hypertension    Subjective [name] is a 43 y.o. female who presents for elevated BP. Due to COVID-19 pandemic, we are interacting via web portal for an electronic face-to-face visit. I verified patient's ID using 2 identifiers. Patient agreed to proceed with visit via this method. Patient is at home, I am at office. Patient and I are present for visit.  She does monitor home blood pressures. Blood pressures ranging from 140's/100's on average. She is not on any meds. She is not adhering to a healthy diet overall. Current exercise: active inside. Some blurred vision, had felt light headed the other day though admits that could have been related to heat. Denies CP or SOB.   Past Medical History:  Diagnosis Date  . Anemia   . Back pain   . Bimalleolar ankle fracture    left  . Breast cancer ([place])   . Family history of breast cancer   . GERD (gastroesophageal reflux disease)   . Hypertension    no meds  . Kidney infection    1998- hosp.- [facility]    Review of Systems Cardiovascular: no chest pain Respiratory:  no shortness of breath  Exam No conversational dyspnea Age appropriate judgment and insight Nml affect and mood  Essential hypertension - Plan: losartan (COZAAR) 50 MG tablet, Basic metabolic panel, monitor BP at home. Ck BMP next week  Orders as above. Counseled on diet and exercise. F/u in 2 weeks with Dr. Etter Sjogren. . The patient voiced understanding and agreement to the plan.  [place], DO 03/13/19  2:38 PM

## 2019-03-13 NOTE — Telephone Encounter (Signed)
Appt scheduled

## 2019-03-13 NOTE — Telephone Encounter (Signed)
Pt called having problems with her BP; she says that her BP was 158/100s on 8//12/2018;  today it was 138/103 at 1055; she would like to be started on BP medication; she also says that she is undergoing treatment for breast CA; the pt says that she felt like she was going to pass out last weekend, and on 03/11/2019; she is not having any symptoms at this time; the pt also says that her BP has been elevated the last few times that she was seen in the office; the pt says that she is not currently having symptoms; recommendations made per nurse triage protocol; she verbalized understanding; she is seen by Dr Etter Sjogren, [place]; pt transferred to [facility] for scheduling; will route to office for notification.  Reason for Disposition . Systolic BP  >= 736 OR Diastolic >= 681  Answer Assessment - Initial Assessment Questions 1. BLOOD PRESSURE: "What is the blood pressure?" "Did you take at least two measurements 5 minutes apart?"      2. ONSET: "When did you take your blood pressure?"      3. HOW: "How did you obtain the blood pressure?" (e.g., visiting nurse, automatic home BP monitor) Home BP cuff on left upper arm 4. HISTORY: "Do you have a history of high blood pressure?"      5. MEDICATIONS: "Are you taking any medications for blood pressure?" "Have you missed any doses recently?"     no 6. OTHER SYMPTOMS: "Do you have any symptoms?" (e.g., headache, chest pain, blurred vision, difficulty breathing, weakness)     Feels like she was going to pass out, and blurred vision  7. PREGNANCY: "Is there any chance you are pregnant?" "When was your last menstrual period?"     No IUD  Protocols used: HIGH BLOOD PRESSURE-A-AH

## 2019-03-16 ENCOUNTER — Other Ambulatory Visit: Payer: Self-pay

## 2019-03-16 ENCOUNTER — Ambulatory Visit
Admission: RE | Admit: 2019-03-16 | Discharge: 2019-03-16 | Disposition: A | Discharge status: Home / Self Care | Payer: BC Managed Care – PPO | Source: Ambulatory Visit | Attending: Radiation Oncology | Admitting: Radiation Oncology

## 2019-03-16 DIAGNOSIS — D0511 Intraductal carcinoma in situ of right breast: Secondary | ICD-10-CM | POA: Diagnosis not present

## 2019-03-16 DIAGNOSIS — Z171 Estrogen receptor negative status [ER-]: Secondary | ICD-10-CM | POA: Diagnosis not present

## 2019-03-16 DIAGNOSIS — Z510 Encounter for antineoplastic radiation therapy: Secondary | ICD-10-CM | POA: Diagnosis not present

## 2019-03-17 ENCOUNTER — Encounter: Payer: Self-pay | Admitting: Family Medicine

## 2019-03-17 ENCOUNTER — Ambulatory Visit
Admission: RE | Admit: 2019-03-17 | Discharge: 2019-03-17 | Disposition: A | Discharge status: Home / Self Care | Payer: BC Managed Care – PPO | Source: Ambulatory Visit | Attending: Radiation Oncology | Admitting: Radiation Oncology

## 2019-03-17 DIAGNOSIS — D0511 Intraductal carcinoma in situ of right breast: Secondary | ICD-10-CM | POA: Diagnosis not present

## 2019-03-17 DIAGNOSIS — Z510 Encounter for antineoplastic radiation therapy: Secondary | ICD-10-CM | POA: Diagnosis not present

## 2019-03-17 DIAGNOSIS — Z171 Estrogen receptor negative status [ER-]: Secondary | ICD-10-CM | POA: Diagnosis not present

## 2019-03-18 ENCOUNTER — Ambulatory Visit
Admission: RE | Admit: 2019-03-18 | Discharge: 2019-03-18 | Disposition: A | Discharge status: Home / Self Care | Payer: BC Managed Care – PPO | Source: Ambulatory Visit | Attending: Radiation Oncology | Admitting: Radiation Oncology

## 2019-03-18 DIAGNOSIS — Z510 Encounter for antineoplastic radiation therapy: Secondary | ICD-10-CM | POA: Diagnosis not present

## 2019-03-18 DIAGNOSIS — D0511 Intraductal carcinoma in situ of right breast: Secondary | ICD-10-CM | POA: Diagnosis not present

## 2019-03-18 DIAGNOSIS — Z171 Estrogen receptor negative status [ER-]: Secondary | ICD-10-CM | POA: Diagnosis not present

## 2019-03-19 ENCOUNTER — Ambulatory Visit
Admission: RE | Admit: 2019-03-19 | Discharge: 2019-03-19 | Disposition: A | Discharge status: Home / Self Care | Payer: BC Managed Care – PPO | Source: Ambulatory Visit | Attending: Radiation Oncology | Admitting: Radiation Oncology

## 2019-03-19 DIAGNOSIS — Z510 Encounter for antineoplastic radiation therapy: Secondary | ICD-10-CM | POA: Diagnosis not present

## 2019-03-19 DIAGNOSIS — D0511 Intraductal carcinoma in situ of right breast: Secondary | ICD-10-CM | POA: Diagnosis not present

## 2019-03-19 DIAGNOSIS — Z171 Estrogen receptor negative status [ER-]: Secondary | ICD-10-CM | POA: Diagnosis not present

## 2019-03-20 ENCOUNTER — Other Ambulatory Visit: Payer: Self-pay

## 2019-03-20 ENCOUNTER — Encounter: Payer: Self-pay | Admitting: Family Medicine

## 2019-03-20 ENCOUNTER — Ambulatory Visit (INDEPENDENT_AMBULATORY_CARE_PROVIDER_SITE_OTHER): Payer: BC Managed Care – PPO | Admitting: Family Medicine

## 2019-03-20 ENCOUNTER — Other Ambulatory Visit (INDEPENDENT_AMBULATORY_CARE_PROVIDER_SITE_OTHER): Payer: BC Managed Care – PPO

## 2019-03-20 ENCOUNTER — Ambulatory Visit
Admission: RE | Admit: 2019-03-20 | Discharge: 2019-03-20 | Disposition: A | Discharge status: Home / Self Care | Payer: BC Managed Care – PPO | Source: Ambulatory Visit | Attending: Radiation Oncology | Admitting: Radiation Oncology

## 2019-03-20 DIAGNOSIS — R319 Hematuria, unspecified: Secondary | ICD-10-CM

## 2019-03-20 DIAGNOSIS — I10 Essential (primary) hypertension: Secondary | ICD-10-CM

## 2019-03-20 DIAGNOSIS — R82998 Other abnormal findings in urine: Secondary | ICD-10-CM | POA: Diagnosis not present

## 2019-03-20 DIAGNOSIS — Z510 Encounter for antineoplastic radiation therapy: Secondary | ICD-10-CM | POA: Diagnosis not present

## 2019-03-20 DIAGNOSIS — R3915 Urgency of urination: Secondary | ICD-10-CM

## 2019-03-20 DIAGNOSIS — Z171 Estrogen receptor negative status [ER-]: Secondary | ICD-10-CM | POA: Diagnosis not present

## 2019-03-20 DIAGNOSIS — N390 Urinary tract infection, site not specified: Secondary | ICD-10-CM

## 2019-03-20 DIAGNOSIS — D0511 Intraductal carcinoma in situ of right breast: Secondary | ICD-10-CM | POA: Diagnosis not present

## 2019-03-20 LAB — POCT URINALYSIS DIP (MANUAL ENTRY)
Glucose, UA: NEGATIVE mg/dL
Ketones, POC UA: NEGATIVE mg/dL
Nitrite, UA: POSITIVE — AB
Protein Ur, POC: 30 mg/dL — AB
Spec Grav, UA: 1.015 (ref 1.010–1.025)
Urobilinogen, UA: 2 E.U./dL — AB
pH, UA: 6 (ref 5.0–8.0)

## 2019-03-20 LAB — BASIC METABOLIC PANEL
BUN: 8 mg/dL (ref 6–23)
CO2: 29 mEq/L (ref 19–32)
Calcium: 9.5 mg/dL (ref 8.4–10.5)
Chloride: 96 mEq/L (ref 96–112)
Creatinine, Ser: 0.61 mg/dL (ref 0.40–1.20)
GFR: 107.11 mL/min (ref >60.00)
Glucose, Bld: 105 mg/dL — ABNORMAL HIGH (ref 70–99)
Potassium: 3.5 mEq/L (ref 3.5–5.1)
Sodium: 138 mEq/L (ref 135–145)

## 2019-03-20 MED ORDER — NITROFURANTOIN MONOHYD MACRO 100 MG PO CAPS: 100.0000 mg | ORAL_CAPSULE | Freq: Two times a day (BID) | ORAL | 0 refills | Status: DC

## 2019-03-20 MED ORDER — PHENAZOPYRIDINE HCL 200 MG PO TABS: 200.0000 mg | ORAL_TABLET | Freq: Three times a day (TID) | ORAL | 0 refills | Status: DC | PRN

## 2019-03-20 NOTE — Progress Notes (Signed)
Virtual Visit via Video Note  I connected with Joyelle Siedlecki Grivas on 03/20/19 at  1:20 PM EDT by a video enabled telemedicine application and verified that I am speaking with the correct person using two identifiers.  Location: Patient: home Provider: office    I discussed the limitations of evaluation and management by telemedicine and the availability of in person appointments. The patient expressed understanding and agreed to proceed.  History of Present Illness: Pt is home c/o dysuria.  She is taking azo and symptoms started 3 days ago.     Observations/Objective: 138/88   Afebrile  Pt in NAD ua-- +nitrates, leuks and blood  Assessment and Plan: 1. Urinary tract infection with hematuria, site unspecified Culture pending abx per orders  - nitrofurantoin, macrocrystal-monohydrate, (MACROBID) 100 MG capsule; Take 1 capsule (100 mg total) by mouth 2 (two) times daily.  Dispense: 14 capsule; Refill: 0 - phenazopyridine (PYRIDIUM) 200 MG tablet; Take 1 tablet (200 mg total) by mouth 3 (three) times daily as needed for pain.  Dispense: 6 tablet; Refill: 0   Follow Up Instructions:    I discussed the assessment and treatment plan with the patient. The patient was provided an opportunity to ask questions and all were answered. The patient agreed with the plan and demonstrated an understanding of the instructions.   The patient was advised to call back or seek an in-person evaluation if the symptoms worsen or if the condition fails to improve as anticipated.  I provided 15 minutes of non-face-to-face time during this encounter.   Ann Held, DO

## 2019-03-20 NOTE — Addendum Note (Signed)
Addended by: Kelle Darting A on: 03/20/2019 11:30 AM   Modules accepted: Orders

## 2019-03-20 NOTE — Addendum Note (Signed)
Addended by: Kelle Darting A on: 03/20/2019 11:45 AM   Modules accepted: Orders

## 2019-03-22 LAB — URINE CULTURE
MICRO NUMBER:: 773102
SPECIMEN QUALITY:: ADEQUATE

## 2019-03-23 ENCOUNTER — Other Ambulatory Visit: Payer: Self-pay | Admitting: Radiation Oncology

## 2019-03-23 ENCOUNTER — Telehealth: Payer: Self-pay | Admitting: Radiation Oncology

## 2019-03-23 ENCOUNTER — Encounter: Payer: Self-pay | Admitting: Radiation Oncology

## 2019-03-23 ENCOUNTER — Ambulatory Visit: Admission: RE | Admit: 2019-03-23 | Payer: BC Managed Care – PPO | Source: Ambulatory Visit

## 2019-03-23 DIAGNOSIS — R509 Fever, unspecified: Secondary | ICD-10-CM

## 2019-03-23 HISTORY — DX: Fever, unspecified: R50.9

## 2019-03-23 NOTE — Telephone Encounter (Signed)
The patient called and LM for me today stating she had an episode of nausea and vomiting over the weekend and also developed chills and  a temperature of >101 degrees F. She took Tylenol and it came down to 99 range. She also reports her she just started antihypertensives with her PCP and her pressures have been in the 161W systolic, while they had been in the 150s last week when she was seen for a work in visit. She denied any shortness of breath, cough or productive sputum, chest pain. She came to the cancer center and failed Covid Screening Questions. She was advised to proceed for Covid testing at [place]. I returned her call after hours and let her know that she should hold treatment with radiotherapy until she has negative result. It appears that Dr. Tammi Klippel has ordered her Covid testing and she will go for this tomorrow. I let her know we would likely have results on Thursday, and could hopefully plan to treat her if she is negative for Covid 19. If not, we would likely delay or conclude her treatments. I will make sure Dr. Lisbeth Renshaw and Blenda Nicely are aware as well.

## 2019-03-23 NOTE — Progress Notes (Signed)
Received communication from lead therapist, Cain Saupe, that the patient is in the lobby reporting fever, nausea and vomiting. Upon further investigation by Laurence Compton, RN the patient reports a fever of 101.2 prior to taking Tylenol earlier today. Presently, the patient's temperature is 98. Also, patient reports nausea and vomiting on Saturday but denies any since that time. Patient denies cough or sore throat per Threasa Beards, RN. Informed Dr. Tammi Klippel of these findings in Dr. Ida Rogue absence. Per Dr. Johny Shears order breast radiation will be held today and patient should get a STAT covid test. Laurence Compton, RN informed the patient of this decision and provided the patient with directions to the testing site. This RN informed the therapist of cancelled treatment and placed order for testing as directed by Dr. Tammi Klippel. Phineas Real, RN for Dr. Lisbeth Renshaw will phone patient tomorrow with test results.

## 2019-03-24 ENCOUNTER — Ambulatory Visit: Payer: BC Managed Care – PPO

## 2019-03-24 ENCOUNTER — Other Ambulatory Visit (HOSPITAL_COMMUNITY)
Admission: RE | Admit: 2019-03-24 | Discharge: 2019-03-24 | Disposition: A | Discharge status: Home / Self Care | Payer: BC Managed Care – PPO | Source: Ambulatory Visit | Attending: Radiation Oncology | Admitting: Radiation Oncology

## 2019-03-24 DIAGNOSIS — Z01812 Encounter for preprocedural laboratory examination: Secondary | ICD-10-CM | POA: Diagnosis not present

## 2019-03-24 DIAGNOSIS — Z20828 Contact with and (suspected) exposure to other viral communicable diseases: Secondary | ICD-10-CM | POA: Insufficient documentation

## 2019-03-24 LAB — SARS CORONAVIRUS 2 (TAT 6-24 HRS): SARS Coronavirus 2: NEGATIVE

## 2019-03-25 ENCOUNTER — Ambulatory Visit
Admission: RE | Admit: 2019-03-25 | Discharge: 2019-03-25 | Disposition: A | Discharge status: Home / Self Care | Payer: BC Managed Care – PPO | Source: Ambulatory Visit | Attending: Radiation Oncology | Admitting: Radiation Oncology

## 2019-03-25 ENCOUNTER — Other Ambulatory Visit: Payer: Self-pay

## 2019-03-25 ENCOUNTER — Ambulatory Visit: Payer: BC Managed Care – PPO

## 2019-03-25 DIAGNOSIS — Z171 Estrogen receptor negative status [ER-]: Secondary | ICD-10-CM | POA: Diagnosis not present

## 2019-03-25 DIAGNOSIS — D0511 Intraductal carcinoma in situ of right breast: Secondary | ICD-10-CM | POA: Diagnosis not present

## 2019-03-25 DIAGNOSIS — Z510 Encounter for antineoplastic radiation therapy: Secondary | ICD-10-CM | POA: Diagnosis not present

## 2019-03-26 ENCOUNTER — Ambulatory Visit
Admission: RE | Admit: 2019-03-26 | Discharge: 2019-03-26 | Disposition: A | Discharge status: Home / Self Care | Payer: BC Managed Care – PPO | Source: Ambulatory Visit | Attending: Radiation Oncology | Admitting: Radiation Oncology

## 2019-03-26 ENCOUNTER — Other Ambulatory Visit: Payer: Self-pay

## 2019-03-26 DIAGNOSIS — D0511 Intraductal carcinoma in situ of right breast: Secondary | ICD-10-CM | POA: Diagnosis not present

## 2019-03-26 DIAGNOSIS — Z171 Estrogen receptor negative status [ER-]: Secondary | ICD-10-CM | POA: Diagnosis not present

## 2019-03-26 DIAGNOSIS — Z510 Encounter for antineoplastic radiation therapy: Secondary | ICD-10-CM | POA: Diagnosis not present

## 2019-03-26 NOTE — Progress Notes (Signed)
  Radiation Oncology         ([phone]) [phone] ________________________________  Name: [name] MRN: [id number]  Date: 02/12/2019  DOB: 01/16/1976  Optical Surface Tracking Plan:  Since intensity modulated radiotherapy (IMRT) and 3D conformal radiation treatment methods are predicated on accurate and precise positioning for treatment, intrafraction motion monitoring is medically necessary to ensure accurate and safe treatment delivery.  The ability to quantify intrafraction motion without excessive ionizing radiation dose can only be performed with optical surface tracking. Accordingly, surface imaging offers the opportunity to obtain 3D measurements of patient position throughout IMRT and 3D treatments without excessive radiation exposure.  I am ordering optical surface tracking for this patient's upcoming course of radiotherapy. ________________________________  Kyung Rudd, MD 03/26/2019 5:19 PM    Reference:   Particia Jasper, et al. Surface imaging-based analysis of intrafraction motion for breast radiotherapy patients.Journal of [place], n. 6, nov. 2014. ISSN [id number].   Available at: <http://www.jacmp.org/index.php/jacmp/article/view/4957>.

## 2019-03-26 NOTE — Progress Notes (Signed)
  Radiation Oncology         [phone]) [phone] ________________________________  Name: [name] MRN: [id number]  Date: 02/12/2019  DOB: [birth year]  DIAGNOSIS:     ICD-10-CM   1. Ductal carcinoma in situ (DCIS) of right breast  D05.11      SIMULATION AND TREATMENT PLANNING NOTE  The patient presented for simulation prior to beginning her course of radiation treatment for her diagnosis of right-sided breast cancer. The patient was placed in a supine position on a breast board. A customized vac-lock bag was constructed and this complex treatment device will be used on a daily basis during her treatment. In this fashion, a CT scan was obtained through the chest area and an isocenter was placed near the chest wall within the breast.  The patient will be planned to receive a course of radiation initially to a dose of 50 Gy. This will consist of a whole breast radiotherapy technique. To accomplish this, 2 customized blocks have been designed which will correspond to medial and lateral whole breast tangent fields. This treatment will be accomplished at 2 Gy per fraction. A forward planning technique will also be evaluated to determine if this approach improves the plan. It is anticipated that the patient will then receive a 14.4 Gy boost to the seroma cavity which has been contoured. This will be accomplished at 1.8 Gy per fraction.   This initial treatment will consist of a 3-D conformal technique. The seroma has been contoured as the primary target structure. Additionally, dose volume histograms of both this target as well as the lungs and heart will also be evaluated. Such an approach is necessary to ensure that the target area is adequately covered while the nearby critical  normal structures are adequately spared.  Plan:  The final anticipated total dose therefore will correspond to 64.4 Gy.    _______________________________   Jodelle Gross, MD, PhD

## 2019-03-27 ENCOUNTER — Other Ambulatory Visit: Payer: Self-pay

## 2019-03-27 ENCOUNTER — Ambulatory Visit (INDEPENDENT_AMBULATORY_CARE_PROVIDER_SITE_OTHER): Payer: BC Managed Care – PPO | Admitting: Family Medicine

## 2019-03-27 ENCOUNTER — Ambulatory Visit
Admission: RE | Admit: 2019-03-27 | Discharge: 2019-03-27 | Disposition: A | Discharge status: Home / Self Care | Payer: BC Managed Care – PPO | Source: Ambulatory Visit | Attending: Radiation Oncology | Admitting: Radiation Oncology

## 2019-03-27 ENCOUNTER — Encounter: Payer: Self-pay | Admitting: Family Medicine

## 2019-03-27 ENCOUNTER — Ambulatory Visit: Payer: BC Managed Care – PPO | Admitting: Radiation Oncology

## 2019-03-27 DIAGNOSIS — D0511 Intraductal carcinoma in situ of right breast: Secondary | ICD-10-CM | POA: Diagnosis not present

## 2019-03-27 DIAGNOSIS — I10 Essential (primary) hypertension: Secondary | ICD-10-CM

## 2019-03-27 DIAGNOSIS — Z510 Encounter for antineoplastic radiation therapy: Secondary | ICD-10-CM | POA: Diagnosis not present

## 2019-03-27 DIAGNOSIS — Z171 Estrogen receptor negative status [ER-]: Secondary | ICD-10-CM | POA: Diagnosis not present

## 2019-03-27 MED ORDER — LOSARTAN POTASSIUM 25 MG PO TABS: 25.0000 mg | ORAL_TABLET | Freq: Every day | ORAL | 2 refills | Status: DC

## 2019-03-27 NOTE — Progress Notes (Signed)
Virtual Visit via Video Note  I connected with [name] on 03/27/19 at 10:00 AM EDT by a video enabled telemedicine application and verified that I am speaking with the correct person using two identifiers.  Location: Patient: home Provider: office    I discussed the limitations of evaluation and management by telemedicine and the availability of in person appointments. The patient expressed understanding and agreed to proceed.  History of Present Illness: Pt is home-- unable to drive due to cancer treatments.  Her bp was running low so she stopped the losartan 3 days ago.  No other complaints today.   She has missed 2 tx this week due to GI but and being tested for covid which was neg.    Observations/Objective: 120/81   Today 127/99  Pt in NAD  She has been off her meds for 3 days because her bp dropped low   Assessment and Plan: 1. Essential hypertension Poorly controlled will alter medications, encouraged DASH diet, minimize caffeine and obtain adequate sleep. Report concerning symptoms and follow up as directed and as needed Cut dose of losartan in 1/2 and f/u in 1 week via my chart - losartan (COZAAR) 25 MG tablet; Take 1 tablet (25 mg total) by mouth daily.  Dispense: 30 tablet; Refill: 2   Follow Up Instructions:    I discussed the assessment and treatment plan with the patient. The patient was provided an opportunity to ask questions and all were answered. The patient agreed with the plan and demonstrated an understanding of the instructions.   The patient was advised to call back or seek an in-person evaluation if the symptoms worsen or if the condition fails to improve as anticipated.  I provided 15 minutes of non-face-to-face time during this encounter.   Ann Held, DO

## 2019-03-30 ENCOUNTER — Other Ambulatory Visit: Payer: Self-pay | Admitting: *Deleted

## 2019-03-30 ENCOUNTER — Ambulatory Visit
Admission: RE | Admit: 2019-03-30 | Discharge: 2019-03-30 | Disposition: A | Discharge status: Home / Self Care | Payer: BC Managed Care – PPO | Source: Ambulatory Visit | Attending: Radiation Oncology | Admitting: Radiation Oncology

## 2019-03-30 ENCOUNTER — Ambulatory Visit: Payer: BC Managed Care – PPO

## 2019-03-30 ENCOUNTER — Encounter: Payer: Self-pay | Admitting: Family Medicine

## 2019-03-30 ENCOUNTER — Other Ambulatory Visit: Payer: Self-pay

## 2019-03-30 DIAGNOSIS — D0511 Intraductal carcinoma in situ of right breast: Secondary | ICD-10-CM | POA: Diagnosis not present

## 2019-03-30 DIAGNOSIS — Z510 Encounter for antineoplastic radiation therapy: Secondary | ICD-10-CM | POA: Diagnosis not present

## 2019-03-30 DIAGNOSIS — Z171 Estrogen receptor negative status [ER-]: Secondary | ICD-10-CM | POA: Diagnosis not present

## 2019-03-30 DIAGNOSIS — R300 Dysuria: Secondary | ICD-10-CM

## 2019-03-30 NOTE — Telephone Encounter (Signed)
Can she come in for UA and culture ?

## 2019-03-31 ENCOUNTER — Ambulatory Visit
Admission: RE | Admit: 2019-03-31 | Discharge: 2019-03-31 | Disposition: A | Discharge status: Home / Self Care | Payer: BC Managed Care – PPO | Source: Ambulatory Visit | Attending: Radiation Oncology | Admitting: Radiation Oncology

## 2019-03-31 ENCOUNTER — Other Ambulatory Visit: Payer: Self-pay

## 2019-03-31 DIAGNOSIS — Z510 Encounter for antineoplastic radiation therapy: Secondary | ICD-10-CM | POA: Diagnosis not present

## 2019-03-31 DIAGNOSIS — Z171 Estrogen receptor negative status [ER-]: Secondary | ICD-10-CM | POA: Diagnosis not present

## 2019-03-31 DIAGNOSIS — D0511 Intraductal carcinoma in situ of right breast: Secondary | ICD-10-CM | POA: Diagnosis not present

## 2019-03-31 MED ORDER — AMOXICILLIN-POT CLAVULANATE 875-125 MG PO TABS: 1.0000 | ORAL_TABLET | Freq: Two times a day (BID) | ORAL | 0 refills | Status: AC

## 2019-03-31 NOTE — Telephone Encounter (Signed)
augmentin 875 bid x 7 days  We will need to repeat ua and culture in 10-14 days

## 2019-03-31 NOTE — Addendum Note (Signed)
Addended by: Kem Boroughs D on: 03/31/2019 01:56 PM   Modules accepted: Orders

## 2019-04-01 ENCOUNTER — Ambulatory Visit
Admission: RE | Admit: 2019-04-01 | Discharge: 2019-04-01 | Disposition: A | Discharge status: Home / Self Care | Payer: BC Managed Care – PPO | Source: Ambulatory Visit | Attending: Radiation Oncology | Admitting: Radiation Oncology

## 2019-04-01 ENCOUNTER — Ambulatory Visit: Payer: BC Managed Care – PPO

## 2019-04-01 DIAGNOSIS — D0511 Intraductal carcinoma in situ of right breast: Secondary | ICD-10-CM | POA: Diagnosis not present

## 2019-04-01 DIAGNOSIS — Z510 Encounter for antineoplastic radiation therapy: Secondary | ICD-10-CM | POA: Diagnosis not present

## 2019-04-01 DIAGNOSIS — Z171 Estrogen receptor negative status [ER-]: Secondary | ICD-10-CM | POA: Diagnosis not present

## 2019-04-02 ENCOUNTER — Ambulatory Visit: Payer: BC Managed Care – PPO

## 2019-04-02 ENCOUNTER — Ambulatory Visit
Admission: RE | Admit: 2019-04-02 | Discharge: 2019-04-02 | Disposition: A | Discharge status: Home / Self Care | Payer: BC Managed Care – PPO | Source: Ambulatory Visit | Attending: Radiation Oncology | Admitting: Radiation Oncology

## 2019-04-02 DIAGNOSIS — Z171 Estrogen receptor negative status [ER-]: Secondary | ICD-10-CM | POA: Diagnosis not present

## 2019-04-02 DIAGNOSIS — D0511 Intraductal carcinoma in situ of right breast: Secondary | ICD-10-CM | POA: Diagnosis not present

## 2019-04-02 DIAGNOSIS — Z510 Encounter for antineoplastic radiation therapy: Secondary | ICD-10-CM | POA: Diagnosis not present

## 2019-04-03 ENCOUNTER — Ambulatory Visit: Payer: BC Managed Care – PPO

## 2019-04-03 ENCOUNTER — Other Ambulatory Visit: Payer: Self-pay

## 2019-04-03 ENCOUNTER — Ambulatory Visit: Admission: RE | Admit: 2019-04-03 | Payer: BC Managed Care – PPO | Source: Ambulatory Visit

## 2019-04-06 ENCOUNTER — Ambulatory Visit: Payer: BC Managed Care – PPO

## 2019-04-06 ENCOUNTER — Ambulatory Visit
Admission: RE | Admit: 2019-04-06 | Discharge: 2019-04-06 | Disposition: A | Discharge status: Home / Self Care | Payer: BC Managed Care – PPO | Source: Ambulatory Visit | Attending: Radiation Oncology | Admitting: Radiation Oncology

## 2019-04-06 ENCOUNTER — Other Ambulatory Visit: Payer: Self-pay | Admitting: Oncology

## 2019-04-06 DIAGNOSIS — D0511 Intraductal carcinoma in situ of right breast: Secondary | ICD-10-CM | POA: Diagnosis not present

## 2019-04-06 DIAGNOSIS — Z171 Estrogen receptor negative status [ER-]: Secondary | ICD-10-CM | POA: Diagnosis not present

## 2019-04-06 DIAGNOSIS — Z510 Encounter for antineoplastic radiation therapy: Secondary | ICD-10-CM | POA: Diagnosis not present

## 2019-04-07 ENCOUNTER — Ambulatory Visit
Admission: RE | Admit: 2019-04-07 | Discharge: 2019-04-07 | Disposition: A | Discharge status: Home / Self Care | Payer: BC Managed Care – PPO | Source: Ambulatory Visit | Attending: Radiation Oncology | Admitting: Radiation Oncology

## 2019-04-07 ENCOUNTER — Ambulatory Visit: Payer: BC Managed Care – PPO

## 2019-04-07 ENCOUNTER — Other Ambulatory Visit: Payer: Self-pay

## 2019-04-07 DIAGNOSIS — Z510 Encounter for antineoplastic radiation therapy: Secondary | ICD-10-CM | POA: Insufficient documentation

## 2019-04-07 DIAGNOSIS — D0511 Intraductal carcinoma in situ of right breast: Secondary | ICD-10-CM | POA: Diagnosis not present

## 2019-04-07 DIAGNOSIS — Z171 Estrogen receptor negative status [ER-]: Secondary | ICD-10-CM | POA: Diagnosis not present

## 2019-04-08 ENCOUNTER — Ambulatory Visit
Admission: RE | Admit: 2019-04-08 | Discharge: 2019-04-08 | Disposition: A | Discharge status: Home / Self Care | Payer: BC Managed Care – PPO | Source: Ambulatory Visit | Attending: Radiation Oncology | Admitting: Radiation Oncology

## 2019-04-08 ENCOUNTER — Other Ambulatory Visit: Payer: Self-pay

## 2019-04-08 ENCOUNTER — Ambulatory Visit: Payer: BC Managed Care – PPO

## 2019-04-08 DIAGNOSIS — Z171 Estrogen receptor negative status [ER-]: Secondary | ICD-10-CM | POA: Diagnosis not present

## 2019-04-08 DIAGNOSIS — D0511 Intraductal carcinoma in situ of right breast: Secondary | ICD-10-CM | POA: Diagnosis not present

## 2019-04-08 DIAGNOSIS — Z510 Encounter for antineoplastic radiation therapy: Secondary | ICD-10-CM | POA: Diagnosis not present

## 2019-04-09 ENCOUNTER — Other Ambulatory Visit: Payer: Self-pay

## 2019-04-09 ENCOUNTER — Ambulatory Visit
Admission: RE | Admit: 2019-04-09 | Discharge: 2019-04-09 | Disposition: A | Discharge status: Home / Self Care | Payer: BC Managed Care – PPO | Source: Ambulatory Visit | Attending: Radiation Oncology | Admitting: Radiation Oncology

## 2019-04-09 DIAGNOSIS — Z171 Estrogen receptor negative status [ER-]: Secondary | ICD-10-CM | POA: Diagnosis not present

## 2019-04-09 DIAGNOSIS — Z510 Encounter for antineoplastic radiation therapy: Secondary | ICD-10-CM | POA: Diagnosis not present

## 2019-04-09 DIAGNOSIS — D0511 Intraductal carcinoma in situ of right breast: Secondary | ICD-10-CM | POA: Diagnosis not present

## 2019-04-10 ENCOUNTER — Other Ambulatory Visit: Payer: Self-pay | Admitting: Gastroenterology

## 2019-04-10 ENCOUNTER — Other Ambulatory Visit: Payer: Self-pay

## 2019-04-10 ENCOUNTER — Ambulatory Visit: Payer: BC Managed Care – PPO

## 2019-04-10 ENCOUNTER — Ambulatory Visit
Admission: RE | Admit: 2019-04-10 | Discharge: 2019-04-10 | Disposition: A | Discharge status: Home / Self Care | Payer: BC Managed Care – PPO | Source: Ambulatory Visit | Attending: Radiation Oncology | Admitting: Radiation Oncology

## 2019-04-10 DIAGNOSIS — Z510 Encounter for antineoplastic radiation therapy: Secondary | ICD-10-CM | POA: Diagnosis not present

## 2019-04-10 DIAGNOSIS — Z171 Estrogen receptor negative status [ER-]: Secondary | ICD-10-CM | POA: Diagnosis not present

## 2019-04-10 DIAGNOSIS — D0511 Intraductal carcinoma in situ of right breast: Secondary | ICD-10-CM | POA: Diagnosis not present

## 2019-04-10 DIAGNOSIS — K219 Gastro-esophageal reflux disease without esophagitis: Secondary | ICD-10-CM

## 2019-04-10 DIAGNOSIS — K22719 Barrett's esophagus with dysplasia, unspecified: Secondary | ICD-10-CM

## 2019-04-14 ENCOUNTER — Other Ambulatory Visit: Payer: Self-pay

## 2019-04-14 ENCOUNTER — Encounter: Payer: Self-pay | Admitting: *Deleted

## 2019-04-14 ENCOUNTER — Encounter: Payer: Self-pay | Admitting: Radiation Oncology

## 2019-04-14 ENCOUNTER — Ambulatory Visit
Admission: RE | Admit: 2019-04-14 | Discharge: 2019-04-14 | Disposition: A | Discharge status: Home / Self Care | Payer: BC Managed Care – PPO | Source: Ambulatory Visit | Attending: Radiation Oncology | Admitting: Radiation Oncology

## 2019-04-14 DIAGNOSIS — Z171 Estrogen receptor negative status [ER-]: Secondary | ICD-10-CM | POA: Diagnosis not present

## 2019-04-14 DIAGNOSIS — D0511 Intraductal carcinoma in situ of right breast: Secondary | ICD-10-CM | POA: Diagnosis not present

## 2019-04-14 DIAGNOSIS — Z510 Encounter for antineoplastic radiation therapy: Secondary | ICD-10-CM | POA: Diagnosis not present

## 2019-04-15 ENCOUNTER — Other Ambulatory Visit: Payer: Self-pay | Admitting: Oncology

## 2019-04-15 NOTE — Progress Notes (Signed)
[facility]  [phone] [phone]    ID: Eddy Termine Guggenheim DOB: Oct 03, 1975  MR#: [id number]  UXL#:[id number]  Patient Care Team: Carollee Herter, Alferd Apa, DO as PCP - General Susa Day, MD (Orthopedic Surgery) Mauro Kaufmann, RN as Oncology Nurse Navigator Rockwell Germany, RN as Oncology Nurse Navigator Erroll Luna, MD as Consulting Physician (General Surgery) Angele Wiemann, [name], MD as Consulting Physician (Oncology) Kyung Rudd, MD as Consulting Physician (Radiation Oncology) Everlene Farrier, MD as Consulting Physician (Obstetrics and Gynecology) Wylene Simmer, MD as Consulting Physician (Orthopedic Surgery) OTHER MD:    CHIEF COMPLAINT: 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   In the right breast, magnified views demonstrate a group of fine pleomorphic calcifications in the lower outer quadrant of the right breast spanning 2.8 x 1.7 x 5.0 cm. Calcifications are not typical for fat necrosis.   Accordingly on 09/17/2018 she proceeded to biopsy of the right breast area in question. The pathology from this procedure showed ([id number]): ductal carcinoma in situ with calcifications, high grade. Prognostic indicators significant for: estrogen receptor, 0% negative and progesterone receptor, 0% negative.   In the left breast, magnified views of the left breast calcifications demonstrate a group of faint pleomorphic calcifications in the upper-outer quadrant measuring approximately 0.8 cm in diameter. Calcifications are not typical for fat necrosis.   Accordingly, she underwent a biopsy of the right breast area in question on 09/17/2018. The pathology from  this procedure showed ([id number]): fibrocystic changes with adenosis and calcifications. There is no evidence of malignancy.    The patient's subsequent history is as detailed below.   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   Since her last visit here, she underwent genetic testing on 09/24/2018 through [facility]'s Breast Cancer STAT Panel + Common Hereditary Cancers Panel showed no pathogenic mutations. The STAT Breast cancer panel offered by [facility] includes sequencing and rearrangement analysis for the following 9 genes:  ATM, BRCA1, BRCA2, CDH1, CHEK2, PALB2, PTEN, STK11 and TP53. The Common Hereditary Cancers Panel offered by [facility] includes sequencing and/or deletion duplication testing of the following 47 genes: APC, ATM, AXIN2, BARD1, BMPR1A, BRCA1, BRCA2, BRIP1, CDH1, CDKN2A (p14ARF), CDKN2A (p16INK4a), CKD4, CHEK2, CTNNA1, DICER1, EPCAM (Deletion/duplication testing only), GREM1 (promoter region deletion/duplication testing only), KIT, MEN1, MLH1, MSH2, MSH3, MSH6, MUTYH, NBN, NF1, NHTL1, PALB2, PDGFRA, PMS2, POLD1, POLE, PTEN, RAD50, RAD51C, RAD51D, SDHB, SDHC, SDHD, SMAD4, SMARCA4. STK11, TP53, TSC1, TSC2, and VHL.  The following genes were evaluated for sequence changes only: SDHA and HOXB13 c.251G>A variant only.   She also underwent a right breast lumpectomy on 01/13/2019. The pathology from this procedure showed ([id number]):  Breast, lumpectomy, right w/seed x3 - ductal carcinoma in situ, 4 cm, high nuclear grade with central necrosis. - margins of resection are not involved (closest margins: less than 1 mm, lateral and posterior). - biopsy site changes.   - see oncology table.  She is scheduled for simulation 02/12/2019  REVIEW OF SYSTEMS: Dejon did well with her surgery, taking minimal pain medicine.  There was no fever or bleeding complications.  She is very pleased with the cosmetic  result.  She is exercising chiefly by walking her dog approximately  30 minutes once a day.  She has not gone into [place] on her waist because she is afraid of getting the incisions wet.  She will check with Dr. Brantley Stage regarding that.  Aside from these issues a detailed review of systems today was negative  PAST MEDICAL HISTORY: Past Medical History:  Diagnosis Date   Anemia    Back pain    Bimalleolar ankle fracture    left   Breast cancer (HCC)    Family history of breast cancer    GERD (gastroesophageal reflux disease)    Hypertension    no meds   Kidney infection    1998- hosp.- [facility]     PAST SURGICAL HISTORY: Past Surgical History:  Procedure Laterality Date   ANTERIOR LUMBAR FUSION  11/14/2011   Procedure: ANTERIOR LUMBAR FUSION 2 LEVELS;  Surgeon: Johnn Hai, MD;  Location: [facility];  Service: Orthopedics;  Laterality: N/A;  ALIF L5-S1   BACK SURGERY     BREAST LUMPECTOMY WITH RADIOACTIVE SEED LOCALIZATION Right 01/13/2019   Procedure: RIGHT BREAST LUMPECTOMY WITH RADIOACTIVE SEED LOCALIZATION X3;  Surgeon: Erroll Luna, MD;  Location: [facility];  Service: General;  Laterality: Right;   CESAREAN SECTION     2007- /w epidural  anesthesia    COLONOSCOPY     LUMBAR LAMINECTOMY/DECOMPRESSION MICRODISCECTOMY Right 12/15/2015   Procedure:  MICO LUMBER DECOMPRESSION L4-5 ON THE RIGHT, ;  Surgeon: Susa Day, MD;  Location: [facility];  Service: Orthopedics;  Laterality: Right;   ORIF ANKLE FRACTURE Left 10/03/2017   Procedure: OPEN REDUCTION INTERNAL FIXATION (ORIF) ANKLE BIMALLEOLAR FRACTURE; possible deltoid ligament repair;  Surgeon: Wylene Simmer, MD;  Location: [facility];  Service: Orthopedics;  Laterality: Left;   SPINE SURGERY     fusion   UPPER GASTROINTESTINAL ENDOSCOPY     WISDOM TOOTH EXTRACTION     young adult     FAMILY HISTORY: Family History  Problem Relation Age of Onset   Lupus Mother    Breast  cancer Maternal Aunt    Anesthesia problems Neg Hx    Colon cancer Neg Hx    Colon polyps Neg Hx    Esophageal cancer Neg Hx    Rectal cancer Neg Hx    Stomach cancer Neg Hx    [name] are both 43 as of 09/2018. The patient has 2 sisters. [name] paternal aunt was diagnosed with breast cancer at 61. Patient denies anyone in her family having breast, ovarian, prostate, or pancreatic cancer.    GYNECOLOGIC HISTORY:  No LMP recorded. (Menstrual status: IUD). 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. Her husband, [name] "BJ" Westerfeld, is a Freight forwarder for a Haematologist. Their son, Legend Pecore, is 46 years old. She has a pit bull. [name], [name], is a retired Consulting civil engineer.    ADVANCED DIRECTIVES: Her husband, BJ, is automatically her healthcare power of attorney.     HEALTH MAINTENANCE: Social History   Tobacco Use   Smoking status: Former Smoker    Packs/day: 0.25    Years: 15.00    Pack years: 3.75    Types: E-cigarettes, Cigarettes    Quit date: 04/01/2017    Years since quitting: 2.0   Smokeless tobacco:  Never Used   Tobacco comment: vapes daily  Substance Use Topics   Alcohol use: Yes    Alcohol/week: 0.0 standard drinks    Comment: a glass of wine  on occas.    Drug use: No    Colonoscopy: yes, 2005  PAP: 09/12/2018  Bone density: no   Allergies  Allergen Reactions   Tamiflu [Oseltamivir Phosphate] Other (See Comments)    Seizure   Ciprofloxacin Diarrhea and Nausea And Vomiting   Metoclopramide Hcl Other (See Comments)    "lock jaw"   Sulfa Antibiotics Nausea And Vomiting    Current Outpatient Medications  Medication Sig Dispense Refill   fluticasone (FLONASE) 50 MCG/ACT nasal spray SPRAY 2 SPRAYS  INTO EACH NOSTRIL EVERY DAY 16 g 1   ibuprofen (ADVIL) 800 MG tablet Take 1 tablet (800 mg total) by mouth every 8 (eight) hours as needed. (Patient not taking: Reported on 02/11/2019) 30 tablet 0   levonorgestrel (MIRENA) 20 MCG/24HR IUD 1 each by Intrauterine route once.     losartan (COZAAR) 25 MG tablet Take 1 tablet (25 mg total) by mouth daily. 30 tablet 2   nitrofurantoin, macrocrystal-monohydrate, (MACROBID) 100 MG capsule Take 1 capsule (100 mg total) by mouth 2 (two) times daily. 14 capsule 0   omeprazole (PRILOSEC) 40 MG capsule TAKE 1 CAPSULE BY MOUTH TWICE A DAY 60 capsule 11   phenazopyridine (PYRIDIUM) 200 MG tablet Take 1 tablet (200 mg total) by mouth 3 (three) times daily as needed for pain. 6 tablet 0   valACYclovir (VALTREX) 1000 MG tablet Take 1 tablet (1,000 mg total) by mouth 3 (three) times daily as needed. For flare ups (Patient not taking: Reported on 02/11/2019) 90 tablet 2   No current facility-administered medications for this visit.      OBJECTIVE: Young white woman who appears well  There were no vitals filed for this visit.   There is no height or weight on file to calculate BMI.   Wt Readings from Last 3 Encounters:  02/11/19 156 lb (70.8 kg)  02/09/19 156 lb 1.6 oz (70.8 kg)  01/13/19 154 lb 8.7 oz (70.1 kg)      ECOG FS:0 - Asymptomatic  Sclerae unicteric, EOMs intact Mask in place No cervical or supraclavicular adenopathy Lungs no rales or rhonchi Heart regular rate and rhythm Abd soft, nontender, positive bowel sounds MSK no focal spinal tenderness, no upper extremity lymphedema Neuro: nonfocal, well oriented, appropriate affect Breasts: The right breast is status post recent lumpectomy.  The cosmetic result is good.  There is induration associated with the inframammary incision.  There is no erythema or swelling.  There is no dehiscence.  The left breast is benign.  Both axillae are benign.  LAB RESULTS:  CMP     Component Value Date/Time    NA 138 03/20/2019 1053   K 3.5 03/20/2019 1053   CL 96 03/20/2019 1053   CO2 29 03/20/2019 1053   GLUCOSE 105 (H) 03/20/2019 1053   BUN 8 03/20/2019 1053   CREATININE 0.61 03/20/2019 1053   CREATININE 0.78 09/24/2018 0849   CALCIUM 9.5 03/20/2019 1053   PROT 8.2 (H) 01/09/2019 1418   ALBUMIN 4.7 01/09/2019 1418   AST 157 (H) 01/09/2019 1418   AST 101 (H) 09/24/2018 0849   ALT 150 (H) 01/09/2019 1418   ALT 102 (H) 09/24/2018 0849   ALKPHOS 39 01/09/2019 1418   BILITOT 1.1 01/09/2019 1418   BILITOT 0.5 09/24/2018 0849   GFRNONAA >60 01/09/2019 1418  GFRNONAA >60 09/24/2018 0849   GFRAA >60 01/09/2019 1418   GFRAA >60 09/24/2018 0849    No results found for: TOTALPROTELP, ALBUMINELP, A1GS, A2GS, BETS, BETA2SER, GAMS, MSPIKE, SPEI  No results found for: KPAFRELGTCHN, LAMBDASER, [facility]  Lab Results  Component Value Date   WBC 4.8 01/09/2019   NEUTROABS 3.0 01/09/2019   HGB 13.9 01/09/2019   HCT 41.0 01/09/2019   MCV 92.6 01/09/2019   PLT 182 01/09/2019    '@LASTCHEMISTRY'$ @  No results found for: LABCA2  No components found for: FTDDUK025  No results for input(s): INR in the last 168 hours.  No results found for: LABCA2  No results found for: KYH062  No results found for: BJS283  No results found for: TDV761  No results found for: CA2729  No components found for: HGQUANT  No results found for: CEA1 / No results found for: CEA1   No results found for: AFPTUMOR  No results found for: CHROMOGRNA  No results found for: PSA1  No visits with results within 3 Day(s) from this visit.  Latest known visit with results is:  Hospital Outpatient Visit on 03/24/2019  Component Date Value Ref Range Status   SARS Coronavirus 2 03/24/2019 NEGATIVE  NEGATIVE Final   Comment: (NOTE) SARS-CoV-2 target nucleic acids are NOT DETECTED. The SARS-CoV-2 RNA is generally detectable in upper and lower respiratory specimens during the acute phase of infection.  Negative results do not preclude SARS-CoV-2 infection, do not rule out co-infections with other pathogens, and should not be used as the sole basis for treatment or other patient management decisions. Negative results must be combined with clinical observations, patient history, and epidemiological information. The expected result is Negative. Fact Sheet for Patients: SugarRoll.be Fact Sheet for Healthcare Providers: https://www.woods-mathews.com/ This test is not yet approved or cleared by the [place] FDA and  has been authorized for detection and/or diagnosis of SARS-CoV-2 by FDA under an Emergency Use Authorization (EUA). This EUA will remain  in effect (meaning this test can be used) for the duration of the COVID-19 declaration under Section 56                          4(b)(1) of the Act, 21 U.S.C. section 360bbb-3(b)(1), unless the authorization is terminated or revoked sooner. Performed at [facility], [place] [address] [place], [place] [address]     (this displays the last labs from the last 3 days)  No results found for: TOTALPROTELP, ALBUMINELP, A1GS, A2GS, BETS, BETA2SER, GAMS, MSPIKE, SPEI (this displays SPEP labs)  No results found for: KPAFRELGTCHN, LAMBDASER, KAPLAMBRATIO (kappa/lambda light chains)  No results found for: HGBA, HGBA2QUANT, HGBFQUANT, HGBSQUAN (Hemoglobinopathy evaluation)   No results found for: LDH  No results found for: IRON, TIBC, IRONPCTSAT (Iron and TIBC)  No results found for: FERRITIN  Urinalysis    Component Value Date/Time   COLORURINE YELLOW 11/08/2011 0912   APPEARANCEUR CLEAR 11/08/2011 0912   LABSPEC 1.023 11/08/2011 0912   PHURINE 5.0 11/08/2011 0912   GLUCOSEU NEGATIVE 11/08/2011 0912   GLUCOSEU NEG mg/dL 09/25/2006 2234   HGBUR NEGATIVE 11/08/2011 0912   HGBUR large 06/01/2009 1449   BILIRUBINUR small (A) 03/20/2019 1142   BILIRUBINUR neg 01/20/2018 1751   KETONESUR  negative 03/20/2019 1142   KETONESUR NEGATIVE 11/08/2011 0912   PROTEINUR =30 (A) 03/20/2019 1142   PROTEINUR Positive (A) 01/20/2018 1751   PROTEINUR NEGATIVE 11/08/2011 0912   UROBILINOGEN 2.0 (A) 03/20/2019 1142  UROBILINOGEN 0.2 11/08/2011 0912   NITRITE Positive (A) 03/20/2019 1142   NITRITE neg 01/20/2018 1751   NITRITE NEGATIVE 11/08/2011 0912   LEUKOCYTESUR Large (3+) (A) 03/20/2019 1142     STUDIES:  No results found.   ELIGIBLE FOR AVAILABLE RESEARCH PROTOCOL: no   ASSESSMENT: 43 y.o. [place], [place] woman that is post right breast biopsy 09/17/2018 for a ductal carcinoma in situ, grade 3, estrogen and progesterone receptor negative  (a) biopsy of a suspicious area in the left breast 09/17/2018 was benign  (b) additional right breast biopsies obtained 10/14/2018, both from the lower outer quadrant, showed ductal carcinoma in situ, high to intermediate grade, 1 of which was estrogen and progesterone receptor negative, the other one strongly estrogen and progesterone receptor positive  (1) genetic testing on 09/24/2018 through [facility]'s Breast Cancer STAT Panel + Common Hereditary Cancers Panel showed no pathogenic mutations. The STAT Breast cancer panel offered by [facility] includes sequencing and rearrangement analysis for the following 9 genes:  ATM, BRCA1, BRCA2, CDH1, CHEK2, PALB2, PTEN, STK11 and TP53. The Common Hereditary Cancers Panel offered by [facility] includes sequencing and/or deletion duplication testing of the following 47 genes: APC, ATM, AXIN2, BARD1, BMPR1A, BRCA1, BRCA2, BRIP1, CDH1, CDKN2A (p14ARF), CDKN2A (p16INK4a), CKD4, CHEK2, CTNNA1, DICER1, EPCAM (Deletion/duplication testing only), GREM1 (promoter region deletion/duplication testing only), KIT, MEN1, MLH1, MSH2, MSH3, MSH6, MUTYH, NBN, NF1, NHTL1, PALB2, PDGFRA, PMS2, POLD1, POLE, PTEN, RAD50, RAD51C, RAD51D, SDHB, SDHC, SDHD, SMAD4, SMARCA4. STK11, TP53, TSC1, TSC2, and VHL.  The following genes were  evaluated for sequence changes only: SDHA and HOXB13 c.251G>A variant only.  (2) right lumpectomy 01/13/2019 showed ductal carcinoma in situ, grade 3, measuring 4 cm, with negative resection margins.  (3) adjuvant radiation pending  (4) to start tamoxifen 05/07/2019   PLAN: [name] did well with her surgery and is very pleased with the cosmetic result.  She is now ready to proceed to radiation and that has already been scheduled.    We discussed antiestrogen prophylaxis.  Part of her tumor was triple negative so that would not be affected.  Part of it was strongly estrogen receptor positive and that section of the tumor would benefit from tamoxifen.  Importantly though tamoxifen will cut in half her risk of developing another breast cancer in the future.  That risk otherwise approaches 1 %/year.  Tamoxifen works very well with the [place] IUD.  It makes it safe and the IUD in turn I cut down on the risk of developing endometrial polyps or endometrial thickening or even endometrial cancer.  Accordingly once she is done with radiation and recovered from that we will start tamoxifen.  Target start date is October 1.  She will see me a couple of months later.  If she tolerates tamoxifen well the plan will be to continue that a total of 5 years  I have encouraged her to exercise more  She knows to call for any other issue that may develop before the next visit.   Kaleeah Gingerich, [name], MD  04/15/19 2:29 PM Medical Oncology and Hematology [facility] [address] [place], [place] [address] Tel. [phone]    Fax. [phone]   I, Jacqualyn Posey am acting as a Education administrator for Chauncey Cruel, MD.   I, Lurline Del MD, have reviewed the above documentation for accuracy and completeness, and I agree with the above.

## 2019-05-01 ENCOUNTER — Encounter: Payer: Self-pay | Admitting: *Deleted

## 2019-05-01 NOTE — Progress Notes (Signed)
  Radiation Oncology         [phone] ________________________________  Name: [name] MRN: [id number]  Date: 04/14/2019  DOB: 1976-02-20  End of Treatment Note  Diagnosis:   right-sided breast cancer     Indication for treatment:  Curative       Radiation treatment dates:   02/23/19 - 04/14/19  Site/dose:   The patient initially received a dose of 50 Gy in 25 fractions to the breast using whole-breast tangent fields. This was delivered using a 3-D conformal technique. The patient then received a boost to the seroma. This delivered an additional 14.4 Gy in 8 fractions using a 4-field photon boost. The total dose was 64.4 Gy.  Narrative: The patient tolerated radiation treatment relatively well.   The patient had some expected skin irritation as she progressed during treatment. Moist desquamation was not present at the end of treatment.  Plan: The patient has completed radiation treatment. The patient will return to radiation oncology clinic for routine followup in one month. I advised the patient to call or return sooner if they have any questions or concerns related to their recovery or treatment. ________________________________  Jodelle Gross, M.D., Ph.D.

## 2019-05-13 ENCOUNTER — Telehealth: Payer: Self-pay | Admitting: Radiation Oncology

## 2019-05-13 NOTE — Telephone Encounter (Signed)
  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 This was delivered using a 3-D conformal technique. The patient then received a boost to the seroma. This delivered an additional 14.4 Gy in 8 fractions using a 4-field photon boost. The total dose was 64.4 Gy.  Narrative:  The patient was contacted today for routine follow-up. During treatment she did very well with radiotherapy and did not have significant desquamation. She did have some episodes of nausea and GI upset and did have covid testing which fortunately was negative in the midst of her treatment. She reports she is feeling great and reports her skin is nearly back to normal. She just started her antiestrogen therapy and this seems to be going well also.   Impression/Plan: 1. 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 The patient has been doing well since completion of radiotherapy. We discussed that we would be happy to continue to follow her as needed, but she will also continue to follow up with Dr. Jana Hakim in medical oncology. She was counseled on skin care as well as measures to avoid sun exposure to this area.  2. Survivorship. We discussed the importance of survivorship evaluation and was given the phone number for Ottis Stain [phone]) to be added to the list to receive the monthly resource calendar for the cancer center.     Carola Rhine, PAC

## 2019-07-07 ENCOUNTER — Telehealth: Payer: Self-pay | Admitting: Oncology

## 2019-07-07 ENCOUNTER — Other Ambulatory Visit: Payer: Self-pay | Admitting: Obstetrics and Gynecology

## 2019-07-07 DIAGNOSIS — Z9889 Other specified postprocedural states: Secondary | ICD-10-CM

## 2019-07-07 NOTE — Telephone Encounter (Signed)
Returned patient's phone call regarding rescheduling 12/03 appointment, per patient's request appointment has moved to 12/21.

## 2019-07-09 ENCOUNTER — Other Ambulatory Visit: Payer: BC Managed Care – PPO

## 2019-07-09 ENCOUNTER — Ambulatory Visit: Payer: BC Managed Care – PPO | Admitting: Oncology

## 2019-07-21 ENCOUNTER — Other Ambulatory Visit: Payer: Self-pay | Admitting: Family Medicine

## 2019-07-24 ENCOUNTER — Other Ambulatory Visit: Payer: Self-pay

## 2019-07-24 DIAGNOSIS — D0511 Intraductal carcinoma in situ of right breast: Secondary | ICD-10-CM

## 2019-07-26 NOTE — Progress Notes (Signed)
[place]  [phone] Fax:(336) [phone]    ID: [name] DOB: [birth year]  MR#: [id number]  EQA#:[id number]  Patient Care Team: Carollee Herter, Alferd Apa, DO as PCP - General Susa Day, MD (Orthopedic Surgery) Mauro Kaufmann, RN as Oncology Nurse Navigator Rockwell Germany, RN as Oncology Nurse Navigator Erroll Luna, MD as Consulting Physician (General Surgery) Marvie Calender, [name], MD as Consulting Physician (Oncology) Kyung Rudd, MD as Consulting Physician (Radiation Oncology) Everlene Farrier, MD as Consulting Physician (Obstetrics and Gynecology) Wylene Simmer, MD as Consulting Physician (Orthopedic Surgery) OTHER MD:    CHIEF COMPLAINT: 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   Since her last visit, she underwent radiation therapy under Dr. Lisbeth Renshaw. She received radiation from 02/23/2019 through 04/14/2019.  She had no significant fatigue and no significant desquamation.  She started on tamoxifen on 05/07/2019.  She is tolerating this remarkably well, with no problems related to hot flashes or any other new symptoms.  She is scheduled for annual mammography on 09/10/2019.   REVIEW OF SYSTEMS: [name] feels totally normal.  She exercises by walking her dog and she is trying to get 7000 steps a day.  She had some blurred vision and was found to have hypertension.  She says she is tolerating the blood pressure medicine well and the blurred vision has resolved.  A detailed review of systems was otherwise stable.   HISTORY OF CURRENT ILLNESS: From the original intake note:  "[name]" Angle Dirusso underwent bilateral diagnostic mammography with tomography at [facility] on 09/12/2018 showing: Breast Density Category C.   In the right breast,  magnified views demonstrate a group of fine pleomorphic calcifications in the lower outer quadrant of the right breast spanning 2.8 x 1.7 x 5.0 cm. Calcifications are not typical for fat necrosis.   Accordingly on 09/17/2018 she proceeded to biopsy of the right breast area in question. The pathology from this procedure showed ([id number]): ductal carcinoma in situ with calcifications, high grade. Prognostic indicators significant for: estrogen receptor, 0% negative and progesterone receptor, 0% negative.   In the left breast, magnified views of the left breast calcifications demonstrate a group of faint pleomorphic calcifications in the upper-outer quadrant measuring approximately 0.8 cm in diameter. Calcifications are not typical for fat necrosis.   Accordingly, she underwent a biopsy of the right breast area in question on 09/17/2018. The pathology from this procedure showed ([id number]): fibrocystic changes with adenosis and calcifications. There is no evidence of malignancy.    The patient's subsequent history is as detailed below.   PAST MEDICAL HISTORY: Past Medical History:  Diagnosis Date  . Anemia   . Back pain   . Bimalleolar ankle fracture    left  . Breast cancer ([place])   . Family history of breast cancer   . GERD (gastroesophageal reflux disease)   . Hypertension    no meds  . Kidney infection    1998- hosp.- [facility]    PAST SURGICAL HISTORY: Past Surgical History:  Procedure Laterality Date  . ANTERIOR LUMBAR FUSION  11/14/2011   Procedure: ANTERIOR LUMBAR FUSION 2 LEVELS;  Surgeon: Johnn Hai, MD;  Location: [facility];  Service: Orthopedics;  Laterality: N/A;  ALIF L5-S1  . BACK SURGERY    . BREAST LUMPECTOMY WITH RADIOACTIVE SEED LOCALIZATION Right 01/13/2019   Procedure:  RIGHT BREAST LUMPECTOMY WITH RADIOACTIVE SEED LOCALIZATION X3;  Surgeon: Erroll Luna, MD;  Location: [facility];  Service: General;  Laterality: Right;  . CESAREAN SECTION     2007- /w  epidural  anesthesia   . COLONOSCOPY    . LUMBAR LAMINECTOMY/DECOMPRESSION MICRODISCECTOMY Right 12/15/2015   Procedure:  MICO LUMBER DECOMPRESSION L4-5 ON THE RIGHT, ;  Surgeon: Susa Day, MD;  Location: [facility];  Service: Orthopedics;  Laterality: Right;  . ORIF ANKLE FRACTURE Left 10/03/2017   Procedure: OPEN REDUCTION INTERNAL FIXATION (ORIF) ANKLE BIMALLEOLAR FRACTURE; possible deltoid ligament repair;  Surgeon: Wylene Simmer, MD;  Location: [facility];  Service: Orthopedics;  Laterality: Left;  . SPINE SURGERY     fusion  . UPPER GASTROINTESTINAL ENDOSCOPY    . WISDOM TOOTH EXTRACTION     young adult    FAMILY HISTORY: Family History  Problem Relation Age of Onset  . Lupus Mother   . Breast cancer Maternal Aunt   . Anesthesia problems Neg Hx   . Colon cancer Neg Hx   . Colon polyps Neg Hx   . Esophageal cancer Neg Hx   . Rectal cancer Neg Hx   . Stomach cancer Neg Hx    [name] are both 80 as of 09/2018. The patient has 2 sisters. [name] paternal aunt was diagnosed with breast cancer at 79. Patient denies anyone in her family having breast, ovarian, prostate, or pancreatic cancer.    GYNECOLOGIC HISTORY:  No LMP recorded. (Menstrual status: IUD). 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. Her husband, [name], is a Freight forwarder for a Haematologist. Their son, [name], is 20 years old. She has a pit bull. [name], [name], is a retired Consulting civil engineer.    ADVANCED DIRECTIVES: Her husband, BJ, is automatically her healthcare power of attorney.     HEALTH MAINTENANCE: Social History   Tobacco Use  . Smoking status: Former Smoker    Packs/day: 0.25    Years: 15.00    Pack years: 3.75    Types:  E-cigarettes, Cigarettes    Quit date: 04/01/2017    Years since quitting: 2.3  . Smokeless tobacco: Never Used  . Tobacco comment: vapes daily  Substance Use Topics  . Alcohol use: Yes    Alcohol/week: 0.0 standard drinks    Comment: a glass of wine  on occas.   . Drug use: No    Colonoscopy: yes, 2005  PAP: 09/12/2018  Bone density: no   Allergies  Allergen Reactions  . Tamiflu [Oseltamivir Phosphate] Other (See Comments)    Seizure  . Ciprofloxacin Diarrhea and Nausea And Vomiting  . Metoclopramide Hcl Other (See Comments)    "lock jaw"  . Sulfa Antibiotics Nausea And Vomiting    Current Outpatient Medications  Medication Sig Dispense Refill  . fluticasone (FLONASE) 50 MCG/ACT nasal spray SPRAY 2 SPRAYS INTO EACH NOSTRIL EVERY DAY 16 mL 0  . ibuprofen (ADVIL) 800 MG tablet Take 1 tablet (800 mg total) by mouth every 8 (eight) hours as needed. (Patient not taking: Reported on 02/11/2019) 30 tablet 0  . levonorgestrel (MIRENA) 20 MCG/24HR IUD 1 each by Intrauterine route once.    Marland Kitchen losartan (COZAAR) 25 MG tablet Take  1 tablet (25 mg total) by mouth daily. 30 tablet 2  . nitrofurantoin, macrocrystal-monohydrate, (MACROBID) 100 MG capsule Take 1 capsule (100 mg total) by mouth 2 (two) times daily. 14 capsule 0  . omeprazole (PRILOSEC) 40 MG capsule TAKE 1 CAPSULE BY MOUTH TWICE A DAY 60 capsule 11  . phenazopyridine (PYRIDIUM) 200 MG tablet Take 1 tablet (200 mg total) by mouth 3 (three) times daily as needed for pain. 6 tablet 0  . valACYclovir (VALTREX) 1000 MG tablet Take 1 tablet (1,000 mg total) by mouth 3 (three) times daily as needed. For flare ups (Patient not taking: Reported on 02/11/2019) 90 tablet 2   No current facility-administered medications for this visit.     OBJECTIVE: Young white woman in no acute distress  Vitals:   07/27/19 1218  BP: (!) 133/96  Pulse: 96  Resp: 18  Temp: 97.8 F (36.6 C)  SpO2: 100%     Body mass index is 26.84 kg/m.   Wt Readings  from Last 3 Encounters:  07/27/19 151 lb 8 oz (68.7 kg)  02/11/19 156 lb (70.8 kg)  02/09/19 156 lb 1.6 oz (70.8 kg)      ECOG FS:1 - Symptomatic but completely ambulatory  Sclerae unicteric, EOMs intact Wearing a mask No cervical or supraclavicular adenopathy Lungs no rales or rhonchi Heart regular rate and rhythm Abd soft, nontender, positive bowel sounds MSK no focal spinal tenderness, no upper extremity lymphedema Neuro: nonfocal, well oriented, appropriate affect Breasts: The right breast is status post lumpectomy and radiation.  There is no residual erythema.  The cosmetic result is excellent.  Left breast is benign.  Both axillae are benign.   LAB RESULTS:  CMP     Component Value Date/Time   NA 140 07/27/2019 1133   K 3.8 07/27/2019 1133   CL 101 07/27/2019 1133   CO2 23 07/27/2019 1133   GLUCOSE 109 (H) 07/27/2019 1133   BUN 9 07/27/2019 1133   CREATININE 0.78 07/27/2019 1133   CALCIUM 9.1 07/27/2019 1133   PROT 8.7 (H) 07/27/2019 1133   ALBUMIN 4.7 07/27/2019 1133   AST 89 (H) 07/27/2019 1133   ALT 56 (H) 07/27/2019 1133   ALKPHOS 37 (L) 07/27/2019 1133   BILITOT 0.6 07/27/2019 1133   GFRNONAA >60 07/27/2019 1133   GFRAA >60 07/27/2019 1133    No results found for: TOTALPROTELP, ALBUMINELP, A1GS, A2GS, BETS, BETA2SER, GAMS, MSPIKE, SPEI  No results found for: KPAFRELGTCHN, LAMBDASER, KAPLAMBRATIO  Lab Results  Component Value Date   WBC 4.9 07/27/2019   NEUTROABS 3.5 07/27/2019   HGB 13.7 07/27/2019   HCT 40.7 07/27/2019   MCV 99.8 07/27/2019   PLT 147 (L) 07/27/2019    @LASTCHEMISTRY @  No results found for: LABCA2  No components found for: DGUYQI347  No results for input(s): INR in the last 168 hours.  No results found for: LABCA2  No results found for: QQV956  No results found for: LOV564  No results found for: PPI951  No results found for: CA2729  No components found for: HGQUANT  No results found for: CEA1 / No results found  for: CEA1   No results found for: AFPTUMOR  No results found for: CHROMOGRNA  No results found for: PSA1  Appointment on 07/27/2019  Component Date Value Ref Range Status  . WBC Count 07/27/2019 4.9  4.0 - 10.5 K/uL Final  . RBC 07/27/2019 4.08  3.87 - 5.11 MIL/uL Final  . Hemoglobin 07/27/2019 13.7  12.0 - 15.0  g/dL Final  . HCT 07/27/2019 40.7  36.0 - 46.0 % Final  . MCV 07/27/2019 99.8  80.0 - 100.0 fL Final  . MCH 07/27/2019 33.6  26.0 - 34.0 pg Final  . MCHC 07/27/2019 33.7  30.0 - 36.0 g/dL Final  . RDW 07/27/2019 12.0  11.5 - 15.5 % Final  . Platelet Count 07/27/2019 147* 150 - 400 K/uL Final  . nRBC 07/27/2019 0.0  0.0 - 0.2 % Final  . Neutrophils Relative % 07/27/2019 71  % Final  . Neutro Abs 07/27/2019 3.5  1.7 - 7.7 K/uL Final  . Lymphocytes Relative 07/27/2019 18  % Final  . Lymphs Abs 07/27/2019 0.9  0.7 - 4.0 K/uL Final  . Monocytes Relative 07/27/2019 9  % Final  . Monocytes Absolute 07/27/2019 0.4  0.1 - 1.0 K/uL Final  . Eosinophils Relative 07/27/2019 1  % Final  . Eosinophils Absolute 07/27/2019 0.0  0.0 - 0.5 K/uL Final  . Basophils Relative 07/27/2019 1  % Final  . Basophils Absolute 07/27/2019 0.0  0.0 - 0.1 K/uL Final  . Immature Granulocytes 07/27/2019 0  % Final  . Abs Immature Granulocytes 07/27/2019 0.02  0.00 - 0.07 K/uL Final   Performed at [facility], [place] [address], [place], [place] [address]  . Sodium 07/27/2019 140  135 - 145 mmol/L Final  . Potassium 07/27/2019 3.8  3.5 - 5.1 mmol/L Final  . Chloride 07/27/2019 101  98 - 111 mmol/L Final  . CO2 07/27/2019 23  22 - 32 mmol/L Final  . Glucose, Bld 07/27/2019 109* 70 - 99 mg/dL Final  . BUN 07/27/2019 9  6 - 20 mg/dL Final  . Creatinine 07/27/2019 0.78  0.44 - 1.00 mg/dL Final  . Calcium 07/27/2019 9.1  8.9 - 10.3 mg/dL Final  . Total Protein 07/27/2019 8.7* 6.5 - 8.1 g/dL Final  . Albumin 07/27/2019 4.7  3.5 - 5.0 g/dL Final  . AST 07/27/2019 89* 15 - 41 U/L Final   . ALT 07/27/2019 56* 0 - 44 U/L Final  . Alkaline Phosphatase 07/27/2019 37* 38 - 126 U/L Final  . Total Bilirubin 07/27/2019 0.6  0.3 - 1.2 mg/dL Final  . GFR, Est Non Af Am 07/27/2019 >60  >60 mL/min Final  . GFR, Est AFR Am 07/27/2019 >60  >60 mL/min Final  . Anion gap 07/27/2019 16* 5 - 15 Final   Performed at [facility], [place] [address] [place], [place] [address]    (this displays the last labs from the last 3 days)  No results found for: TOTALPROTELP, ALBUMINELP, A1GS, A2GS, BETS, BETA2SER, GAMS, MSPIKE, SPEI (this displays SPEP labs)  No results found for: KPAFRELGTCHN, LAMBDASER, KAPLAMBRATIO (kappa/lambda light chains)  No results found for: HGBA, HGBA2QUANT, HGBFQUANT, HGBSQUAN (Hemoglobinopathy evaluation)   No results found for: LDH  No results found for: IRON, TIBC, IRONPCTSAT (Iron and TIBC)  No results found for: FERRITIN  Urinalysis    Component Value Date/Time   COLORURINE YELLOW 11/08/2011 0912   APPEARANCEUR CLEAR 11/08/2011 0912   LABSPEC 1.023 11/08/2011 0912   PHURINE 5.0 11/08/2011 0912   GLUCOSEU NEGATIVE 11/08/2011 0912   GLUCOSEU NEG mg/dL 09/25/2006 2234   HGBUR NEGATIVE 11/08/2011 0912   HGBUR large 06/01/2009 1449   BILIRUBINUR small (A) 03/20/2019 1142   BILIRUBINUR neg 01/20/2018 1751   KETONESUR negative 03/20/2019 1142   KETONESUR NEGATIVE 11/08/2011 0912   PROTEINUR =30 (A) 03/20/2019 1142   PROTEINUR Positive (A) 01/20/2018  [place] 11/08/2011 0912   UROBILINOGEN 2.0 (A) 03/20/2019 1142   UROBILINOGEN 0.2 11/08/2011 0912   NITRITE Positive (A) 03/20/2019 1142   NITRITE neg 01/20/2018 1751   NITRITE NEGATIVE 11/08/2011 0912   LEUKOCYTESUR Large (3+) (A) 03/20/2019 1142     STUDIES:  No results found.   ELIGIBLE FOR AVAILABLE RESEARCH PROTOCOL: no   ASSESSMENT: 43 y.o. [place], [place] woman that is post right breast biopsy 09/17/2018 for a ductal carcinoma in situ, grade 3,  estrogen and progesterone receptor negative  (a) biopsy of a suspicious area in the left breast 09/17/2018 was benign  (b) additional right breast biopsies obtained 10/14/2018, both from the lower outer quadrant, showed ductal carcinoma in situ, high to intermediate grade, 1 of which was estrogen and progesterone receptor negative, the other one strongly estrogen and progesterone receptor positive  (1) genetic testing on 09/24/2018 through [facility]'s Breast Cancer STAT Panel + Common Hereditary Cancers Panel showed no pathogenic mutations. The STAT Breast cancer panel offered by [facility] includes sequencing and rearrangement analysis for the following 9 genes:  ATM, BRCA1, BRCA2, CDH1, CHEK2, PALB2, PTEN, STK11 and TP53. The Common Hereditary Cancers Panel offered by [facility] includes sequencing and/or deletion duplication testing of the following 47 genes: APC, ATM, AXIN2, BARD1, BMPR1A, BRCA1, BRCA2, BRIP1, CDH1, CDKN2A (p14ARF), CDKN2A (p16INK4a), CKD4, CHEK2, CTNNA1, DICER1, EPCAM (Deletion/duplication testing only), GREM1 (promoter region deletion/duplication testing only), KIT, MEN1, MLH1, MSH2, MSH3, MSH6, MUTYH, NBN, NF1, NHTL1, PALB2, PDGFRA, PMS2, POLD1, POLE, PTEN, RAD50, RAD51C, RAD51D, SDHB, SDHC, SDHD, SMAD4, SMARCA4. STK11, TP53, TSC1, TSC2, and VHL.  The following genes were evaluated for sequence changes only: SDHA and HOXB13 c.251G>A variant only.  (2) right lumpectomy 01/13/2019 showed ductal carcinoma in situ, grade 3, measuring 4 cm, with negative resection margins.  (3) adjuvant radiation 02/23/2019 - 04/14/2019  (a) right breast / 50 Gy in 25 fractions  (b) seroma boost / 14.4 Gy in 8 fractions  (4) started tamoxifen 05/07/2019   PLAN: [name] has completed her local treatment, namely surgery and radiation.  She tolerated them remarkably well.  She has started tamoxifen.  So far she has had no problems from it.  She had Mirena IUD inserted.  I reassured her that this is quite safe  while she is on tamoxifen.  Her dad wondered about this because her cancer was at least partly progesterone receptor positive.  We reviewed the fact that the progesterone receptor is not functional unless the estrogen receptor is functioning.  She has slightly elevated liver function tests.  They were considerably worse in March of this year, and have become better since that time.  This is likely fatty liver.  We discussed the things that can cause fatty liver including alcohol intake.  Nevertheless the numbers are actually better than they used to be.   I am going to add a lipid panel to her next set of labs.  Accordingly the plan now is to continue tamoxifen a total of 5 years.  She will see Dr. Brantley Stage in February.  She will return to see me in August  She knows to call for any other issue that may develop before the next visit.   Marshea Wisher, [name], MD  07/27/19 12:27 PM Medical Oncology and Hematology [facility] [place], [place] [address] Tel. [phone]    Fax. [phone]    I, Wilburn Mylar, am acting as scribe for Dr. [name]. Asier Desroches.  Joylene Grapes Teirra Carapia  MD, have reviewed the above documentation for accuracy and completeness, and I agree with the above.

## 2019-07-27 ENCOUNTER — Other Ambulatory Visit: Payer: Self-pay

## 2019-07-27 ENCOUNTER — Inpatient Hospital Stay (HOSPITAL_BASED_OUTPATIENT_CLINIC_OR_DEPARTMENT_OTHER): Payer: BC Managed Care – PPO | Admitting: Oncology

## 2019-07-27 ENCOUNTER — Inpatient Hospital Stay: Payer: BC Managed Care – PPO | Attending: Oncology

## 2019-07-27 VITALS — BP 133/96 | HR 96 | Temp 97.8°F | Resp 18 | Ht 63.0 in | Wt 151.5 lb

## 2019-07-27 DIAGNOSIS — Z79810 Long term (current) use of selective estrogen receptor modulators (SERMs): Secondary | ICD-10-CM | POA: Insufficient documentation

## 2019-07-27 DIAGNOSIS — D0511 Intraductal carcinoma in situ of right breast: Secondary | ICD-10-CM | POA: Insufficient documentation

## 2019-07-27 DIAGNOSIS — Z171 Estrogen receptor negative status [ER-]: Secondary | ICD-10-CM | POA: Insufficient documentation

## 2019-07-27 LAB — CMP (CANCER CENTER ONLY)
ALT: 56 U/L — ABNORMAL HIGH (ref 0–44)
AST: 89 U/L — ABNORMAL HIGH (ref 15–41)
Albumin: 4.7 g/dL (ref 3.5–5.0)
Alkaline Phosphatase: 37 U/L — ABNORMAL LOW (ref 38–126)
Anion gap: 16 — ABNORMAL HIGH (ref 5–15)
BUN: 9 mg/dL (ref 6–20)
CO2: 23 mmol/L (ref 22–32)
Calcium: 9.1 mg/dL (ref 8.9–10.3)
Chloride: 101 mmol/L (ref 98–111)
Creatinine: 0.78 mg/dL (ref 0.44–1.00)
GFR, Est AFR Am: >60 mL/min (ref >60)
GFR, Estimated: >60 mL/min (ref >60)
Glucose, Bld: 109 mg/dL — ABNORMAL HIGH (ref 70–99)
Potassium: 3.8 mmol/L (ref 3.5–5.1)
Sodium: 140 mmol/L (ref 135–145)
Total Bilirubin: 0.6 mg/dL (ref 0.3–1.2)
Total Protein: 8.7 g/dL — ABNORMAL HIGH (ref 6.5–8.1)

## 2019-07-27 LAB — CBC WITH DIFFERENTIAL (CANCER CENTER ONLY)
Abs Immature Granulocytes: 0.02 10*3/uL (ref 0.00–0.07)
Basophils Absolute: 0 10*3/uL (ref 0.0–0.1)
Basophils Relative: 1 %
Eosinophils Absolute: 0 10*3/uL (ref 0.0–0.5)
Eosinophils Relative: 1 %
HCT: 40.7 % (ref 36.0–46.0)
Hemoglobin: 13.7 g/dL (ref 12.0–15.0)
Immature Granulocytes: 0 %
Lymphocytes Relative: 18 %
Lymphs Abs: 0.9 10*3/uL (ref 0.7–4.0)
MCH: 33.6 pg (ref 26.0–34.0)
MCHC: 33.7 g/dL (ref 30.0–36.0)
MCV: 99.8 fL (ref 80.0–100.0)
Monocytes Absolute: 0.4 10*3/uL (ref 0.1–1.0)
Monocytes Relative: 9 %
Neutro Abs: 3.5 10*3/uL (ref 1.7–7.7)
Neutrophils Relative %: 71 %
Platelet Count: 147 10*3/uL — ABNORMAL LOW (ref 150–400)
RBC: 4.08 MIL/uL (ref 3.87–5.11)
RDW: 12 % (ref 11.5–15.5)
WBC Count: 4.9 10*3/uL (ref 4.0–10.5)
nRBC: 0 % (ref 0.0–0.2)

## 2019-07-27 MED ORDER — TAMOXIFEN CITRATE 20 MG PO TABS: 20.0000 mg | ORAL_TABLET | Freq: Every day | ORAL | 12 refills | Status: AC

## 2019-07-28 ENCOUNTER — Telehealth: Payer: Self-pay | Admitting: Oncology

## 2019-07-28 NOTE — Telephone Encounter (Signed)
I left a message regarding schedule  

## 2019-08-04 ENCOUNTER — Encounter: Payer: Self-pay | Admitting: *Deleted

## 2019-08-05 NOTE — Telephone Encounter (Signed)
Mychart message came back as unread. Mailed letter.

## 2019-08-17 DIAGNOSIS — D0511 Intraductal carcinoma in situ of right breast: Secondary | ICD-10-CM | POA: Diagnosis not present

## 2019-09-10 ENCOUNTER — Ambulatory Visit
Admission: RE | Admit: 2019-09-10 | Discharge: 2019-09-10 | Disposition: A | Discharge status: Home / Self Care | Payer: BC Managed Care – PPO | Source: Ambulatory Visit | Attending: Obstetrics and Gynecology | Admitting: Obstetrics and Gynecology

## 2019-09-10 ENCOUNTER — Other Ambulatory Visit: Payer: Self-pay

## 2019-09-10 DIAGNOSIS — I10 Essential (primary) hypertension: Secondary | ICD-10-CM | POA: Insufficient documentation

## 2019-09-10 DIAGNOSIS — Z6826 Body mass index (BMI) 26.0-26.9, adult: Secondary | ICD-10-CM | POA: Diagnosis not present

## 2019-09-10 DIAGNOSIS — Z01419 Encounter for gynecological examination (general) (routine) without abnormal findings: Secondary | ICD-10-CM | POA: Diagnosis not present

## 2019-09-10 DIAGNOSIS — Z9889 Other specified postprocedural states: Secondary | ICD-10-CM

## 2019-09-10 DIAGNOSIS — R922 Inconclusive mammogram: Secondary | ICD-10-CM | POA: Diagnosis not present

## 2019-09-10 DIAGNOSIS — Z30431 Encounter for routine checking of intrauterine contraceptive device: Secondary | ICD-10-CM | POA: Diagnosis not present

## 2019-09-10 DIAGNOSIS — Z853 Personal history of malignant neoplasm of breast: Secondary | ICD-10-CM | POA: Diagnosis not present

## 2019-09-10 HISTORY — DX: Personal history of irradiation: Z92.3

## 2019-09-10 LAB — HM PAP SMEAR

## 2019-09-10 LAB — RESULTS CONSOLE HPV: CHL HPV: NEGATIVE

## 2019-09-17 ENCOUNTER — Other Ambulatory Visit: Payer: Self-pay | Admitting: Family Medicine

## 2019-09-17 DIAGNOSIS — I10 Essential (primary) hypertension: Secondary | ICD-10-CM

## 2019-09-18 ENCOUNTER — Telehealth: Payer: Self-pay

## 2019-09-18 DIAGNOSIS — I10 Essential (primary) hypertension: Secondary | ICD-10-CM

## 2019-09-18 MED ORDER — LOSARTAN POTASSIUM 25 MG PO TABS: 25.0000 mg | ORAL_TABLET | Freq: Every day | ORAL | 0 refills | Status: DC

## 2019-09-18 NOTE — Addendum Note (Signed)
Addended by: Sanda Linger on: 09/18/2019 02:01 PM   Modules accepted: Orders

## 2019-09-18 NOTE — Telephone Encounter (Signed)
Patient called in needing Dr. Etter Sjogren to call in a prescription for her blood pressure medication   losartan (COZAAR) 25 MG tablet    Thanks,

## 2019-09-23 ENCOUNTER — Ambulatory Visit (INDEPENDENT_AMBULATORY_CARE_PROVIDER_SITE_OTHER): Payer: BC Managed Care – PPO | Admitting: Family Medicine

## 2019-09-23 ENCOUNTER — Other Ambulatory Visit: Payer: Self-pay

## 2019-09-23 ENCOUNTER — Encounter: Payer: Self-pay | Admitting: Family Medicine

## 2019-09-23 DIAGNOSIS — I10 Essential (primary) hypertension: Secondary | ICD-10-CM | POA: Diagnosis not present

## 2019-09-23 MED ORDER — LOSARTAN POTASSIUM 25 MG PO TABS: 25.0000 mg | ORAL_TABLET | Freq: Every day | ORAL | 1 refills | Status: DC

## 2019-09-23 NOTE — Progress Notes (Signed)
Virtual Visit via Video Note  I connected with [name] on 09/23/19 at  8:00 AM EST by a video enabled telemedicine application and verified that I am speaking with the correct person using two identifiers.  Location: Patient: home  Provider: home    I discussed the limitations of evaluation and management by telemedicine and the availability of in person appointments. The patient expressed understanding and agreed to proceed.  History of Present Illness: Pt is home no complaints --- she did not realize her losartan was 50 mg --- she was not cutting them in half.  No other complaints   Past Medical History:  Diagnosis Date  . Anemia   . Back pain   . Bimalleolar ankle fracture    left  . Breast cancer ([place])   . Family history of breast cancer   . GERD (gastroesophageal reflux disease)   . Hypertension    no meds  . Kidney infection    1998- hosp.- [facility]  . Personal history of radiation therapy    Current Outpatient Medications on File Prior to Visit  Medication Sig Dispense Refill  . levonorgestrel (MIRENA) 20 MCG/24HR IUD 1 each by Intrauterine route once.    Marland Kitchen omeprazole (PRILOSEC) 40 MG capsule TAKE 1 CAPSULE BY MOUTH TWICE A DAY 60 capsule 11  . valACYclovir (VALTREX) 1000 MG tablet Take 1 tablet (1,000 mg total) by mouth 3 (three) times daily as needed. For flare ups 90 tablet 2  . fluticasone (FLONASE) 50 MCG/ACT nasal spray SPRAY 2 SPRAYS INTO EACH NOSTRIL EVERY DAY (Patient not taking: Reported on 09/23/2019) 16 mL 0  . ibuprofen (ADVIL) 800 MG tablet Take 1 tablet (800 mg total) by mouth every 8 (eight) hours as needed. (Patient not taking: Reported on 02/11/2019) 30 tablet 0  . phenazopyridine (PYRIDIUM) 200 MG tablet Take 1 tablet (200 mg total) by mouth 3 (three) times daily as needed for pain. (Patient not taking: Reported on 09/23/2019) 6 tablet 0  . [DISCONTINUED] Norgestim-Eth Radene Journey Triphasic (ORTHO TRI-CYCLEN, 28, PO) Take 0.035 mg by mouth daily.       No current facility-administered medications on file prior to visit.   Allergies  Allergen Reactions  . Tamiflu [Oseltamivir Phosphate] Other (See Comments)    Seizure  . Ciprofloxacin Diarrhea and Nausea And Vomiting  . Metoclopramide Hcl Other (See Comments)    "lock jaw"  . Sulfa Antibiotics Nausea And Vomiting   Social History   Socioeconomic History  . Marital status: Married    Spouse name: Not on file  . Number of children: 1  . Years of education: Not on file  . Highest education level: Not on file  Occupational History    Comment: working full time  Tobacco Use  . Smoking status: Former Smoker    Packs/day: 0.25    Years: 15.00    Pack years: 3.75    Types: E-cigarettes, Cigarettes    Quit date: 04/01/2017    Years since quitting: 2.4  . Smokeless tobacco: Never Used  . Tobacco comment: vapes daily  Substance and Sexual Activity  . Alcohol use: Yes    Alcohol/week: 0.0 standard drinks    Comment: a glass of wine  on occas.   . Drug use: No  . Sexual activity: Yes    Birth control/protection: I.U.D.  Other Topics Concern  . Not on file  Social History Narrative  . Not on file   Social Determinants of Health   Financial Resource Strain:   .  Difficulty of Paying Living Expenses: Not on file  Food Insecurity:   . Worried About Charity fundraiser in the Last Year: Not on file  . Ran Out of Food in the Last Year: Not on file  Transportation Needs:   . Lack of Transportation (Medical): Not on file  . Lack of Transportation (Non-Medical): Not on file  Physical Activity:   . Days of Exercise per Week: Not on file  . Minutes of Exercise per Session: Not on file  Stress:   . Feeling of Stress : Not on file  Social Connections:   . Frequency of Communication with Friends and Family: Not on file  . Frequency of Social Gatherings with Friends and Family: Not on file  . Attends Religious Services: Not on file  . Active Member of Clubs or Organizations: Not  on file  . Attends Archivist Meetings: Not on file  . Marital Status: Not on file  Intimate Partner Violence:   . Fear of Current or Ex-Partner: Not on file  . Emotionally Abused: Not on file  . Physically Abused: Not on file  . Sexually Abused: Not on file     Observations/Objective: Vitals:   09/23/19 0804  BP: 107/78  Pulse: 74  pt is in nad   Assessment and Plan: 1. Essential hypertension Had to transition to telephone call due to pt camera not working  Well controlled, no changes to meds. Encouraged heart healthy diet such as the DASH diet and exercise as tolerated.  Check labs  - losartan (COZAAR) 25 MG tablet; Take 1 tablet (25 mg total) by mouth daily. Pt needs OV for further refills  Dispense: 90 tablet; Refill: 1 - Lipid panel; Future - Comprehensive metabolic panel; Future  Questions answered about covid vaccine  Follow Up Instructions:    I discussed the assessment and treatment plan with the patient. The patient was provided an opportunity to ask questions and all were answered. The patient agreed with the plan and demonstrated an understanding of the instructions.   The patient was advised to call back or seek an in-person evaluation if the symptoms worsen or if the condition fails to improve as anticipated.  I provided 25 minutes of non-face-to-face time during this encounter.   Ann Held, DO

## 2019-09-25 ENCOUNTER — Telehealth: Payer: Self-pay | Admitting: *Deleted

## 2019-09-25 NOTE — Telephone Encounter (Signed)
This RN spoke with pt per her call wanting to know if she needed an MRI in addition to her recent mammogram.  This RN informed her mammogram had good reading ( which she was aware of ) and if MD felt she needed higher surveillance - MRI would be done in 6 months.  This RN informed her above would be reviewed and confirmed by the MD.  Pt is scheduled for office visit in August this year.

## 2020-02-18 ENCOUNTER — Other Ambulatory Visit: Payer: Self-pay

## 2020-02-18 ENCOUNTER — Ambulatory Visit (INDEPENDENT_AMBULATORY_CARE_PROVIDER_SITE_OTHER): Payer: BC Managed Care – PPO | Admitting: Family Medicine

## 2020-02-18 ENCOUNTER — Encounter: Payer: Self-pay | Admitting: Family Medicine

## 2020-02-18 VITALS — BP 120/80 | HR 117 | Temp 99.3°F | Resp 18 | Ht 63.0 in | Wt 155.8 lb

## 2020-02-18 DIAGNOSIS — H04123 Dry eye syndrome of bilateral lacrimal glands: Secondary | ICD-10-CM | POA: Diagnosis not present

## 2020-02-18 MED ORDER — RESTASIS 0.05 % OP EMUL: 1.0000 [drp] | Freq: Two times a day (BID) | OPHTHALMIC | 2 refills | Status: DC

## 2020-02-18 NOTE — Patient Instructions (Signed)
Dry Eye  Dry eye, also called keratoconjunctivitis sicca, is dryness of the membranes surrounding the eye. It happens when there are not enough healthy, natural tears in the eyes. The eyes must remain moist at all times. A small amount of tears is constantly produced by the tear glands (lacrimal glands). These glands are located under the outside part of the upper eyelids. Dry eye can happen on its own or be a symptom of several conditions, such as rheumatoid arthritis, lupus, or Sjgren's syndrome. Dry eye may be mild to severe. What are the causes? This condition may be caused by:  Not making enough tears (aqueous tear-deficient dry eyes).  Tears evaporating from the eyes too quickly (evaporative dry eyes). This is when there is an abnormality in the quality of your tears. This abnormality causes your tears to evaporate so quickly that the eyes cannot be kept moist. What increases the risk? You are more likely to develop this condition if you:  Are a woman, especially if you have gone through menopause.  Live in a dry climate.  Live in a dusty or smoky area.  Take certain medicines, such as: ? Anti-allergy medicines (antihistamines). ? Blood pressure medicines (antihypertensives). ? Birth control pills (oral contraceptives). ? Laxatives. ? Tranquilizers.  Have a history of refractive eye surgery, such as LASIK.  Have a history of long-term contact lens use. What are the signs or symptoms? Symptoms of this condition include:  Irritation.  Itchiness.  Redness.  Burning.  Inflammation of the eyelids.  Feeling as though something is stuck in the eye.  Light sensitivity.  Increased sensitivity and discomfort when wearing contact lenses.  Vision that varies throughout the day.  Occasional excessive tearing. How is this diagnosed? This condition is diagnosed based on your symptoms, your medical history, and an eye exam.  Your health care provider may look at your eye  using a microscope and may put dyes in your eye to check the health of the surface of your eye.  You may have tests, such as a test to evaluate your tear production (Schirmer test). ? During this test, a small strip of special paper is gently pressed into the inner corner of your eye. ? Your tear production is measured by how much of the paper is moistened by your tears during a set amount of time. You may be referred to a health care provider who specializes in eyes and eyesight (ophthalmologist). How is this treated? Treatment for this condition depends on the severity. Mild cases are often treated at home. To help relieve your symptoms, your health care provider may recommend eye drops, which are also called artificial tears.  If your condition is severe, treatment may include: ? Prescription eye drops. ? Over-the-counter or prescription ointments to moisten your eyes. ? Minor surgery to place plugs into the tear ducts. This keep tears from draining so that tears can stay on the surface of the eye longer. ? Medicines to reduce inflammation of the eyelids. ? Taking an omega-3 fatty acid nutritional supplement. Follow these instructions at home:  Take or apply over-the-counter and prescription medicines only as told by your health care provider. This includes eye drops.  If directed, apply a warm compress to your eyes to help reduce inflammation. Place a towel over your eyes and gently press the warm compress over your eyes for about 5 minutes, or as long as told by your health care provider.  Drink plenty of fluids to stay well hydrated.  If   possible, avoid dry, drafty environments.  Wear sunglasses when outdoors to protect your eyes from the sun and wind.  Use a humidifier at home to increase moisture in the air.  Remember to blink often when reading or using the computer for long periods.  If you wear contact lenses, remove them regularly to give your eyes a break. Always remove  contacts before sleeping.  Have a yearly eye exam and vision test.  Keep all follow-up visits as told by your health care provider. This is important. Contact a health care provider if:  You have eye pain.  You have pus-like fluid coming from your eye.  Your symptoms get worse or do not improve with treatment. Get help right away if:  Your vision suddenly changes. Summary  Dry eye is dryness of the membranes surrounding the eye.  Dry eye can happen on its own or be a symptom of several conditions, such as rheumatoid arthritis, lupus, or Sjgren's syndrome.  This condition is diagnosed based on your symptoms, your medical history, and an eye exam.  Treatment for this condition depends on the severity. Mild cases are often treated at home. To help relieve your symptoms, your health care provider may recommend eye drops, which are also called artificial tears. This information is not intended to replace advice given to you by your health care provider. Make sure you discuss any questions you have with your health care provider. Document Revised: 01/14/2018 Document Reviewed: 01/14/2018 Elsevier Patient Education  2020 [employer]  

## 2020-02-19 ENCOUNTER — Telehealth: Payer: Self-pay

## 2020-02-19 NOTE — Telephone Encounter (Signed)
PA initiated via Covermymeds; KEY: [id number]. Awaiting determination.

## 2020-02-21 NOTE — Progress Notes (Signed)
Patient ID: [name], female    DOB: 22-Jul-1976  Age: 44 y.o. MRN: [id number]    Subjective:  Subjective  HPI [name] presents for DRY EYES.  She has seen the eye dr a few times for this and was told to use otc drops for lubrication.   No other complaint s  Review of Systems  Constitutional: Negative for appetite change, diaphoresis, fatigue and unexpected weight change.  Eyes: Negative for pain, redness and visual disturbance.  Respiratory: Negative for cough, chest tightness, shortness of breath and wheezing.   Cardiovascular: Negative for chest pain, palpitations and leg swelling.  Endocrine: Negative for cold intolerance, heat intolerance, polydipsia, polyphagia and polyuria.  Genitourinary: Negative for difficulty urinating, dysuria and frequency.  Neurological: Negative for dizziness, light-headedness, numbness and headaches.    History Past Medical History:  Diagnosis Date  . Anemia   . Back pain   . Bimalleolar ankle fracture    left  . Breast cancer ([place])   . Family history of breast cancer   . GERD (gastroesophageal reflux disease)   . Hypertension    no meds  . Kidney infection    1998- hosp.- [facility]  . Personal history of radiation therapy     She has a past surgical history that includes Cesarean section; Colonoscopy; Wisdom tooth extraction; Anterior lumbar fusion (11/14/2011); Spine surgery; Lumbar laminectomy/decompression microdiscectomy (Right, 12/15/2015); Upper gastrointestinal endoscopy; Back surgery; ORIF ankle fracture (Left, 10/03/2017); Breast lumpectomy with radioactive seed localization (Right, 01/13/2019); and Breast lumpectomy (Right, 01/2019).   Her family history includes Breast cancer in her maternal aunt; Lupus in her mother.She reports that she quit smoking about 2 years ago. Her smoking use included e-cigarettes and cigarettes. She has a 3.75 pack-year smoking history. She has never used smokeless tobacco. She reports  current alcohol use. She reports that she does not use drugs.  Current Outpatient Medications on File Prior to Visit  Medication Sig Dispense Refill  . levonorgestrel (MIRENA) 20 MCG/24HR IUD 1 each by Intrauterine route once.    Marland Kitchen losartan (COZAAR) 25 MG tablet Take 1 tablet (25 mg total) by mouth daily. Pt needs OV for further refills 90 tablet 1  . omeprazole (PRILOSEC) 40 MG capsule TAKE 1 CAPSULE BY MOUTH TWICE A DAY 60 capsule 11  . tamoxifen (NOLVADEX) 20 MG tablet tamoxifen 20 mg tablet  TAKE 1 TABLET BY MOUTH EVERY DAY    . valACYclovir (VALTREX) 1000 MG tablet Take 1 tablet (1,000 mg total) by mouth 3 (three) times daily as needed. For flare ups 90 tablet 2  . [DISCONTINUED] Norgestim-Eth Radene Journey Triphasic (ORTHO TRI-CYCLEN, 28, PO) Take 0.035 mg by mouth daily.      No current facility-administered medications on file prior to visit.     Objective:  Objective  Physical Exam Vitals and nursing note reviewed.  Constitutional:      Appearance: She is well-developed.  HENT:     Head: Normocephalic and atraumatic.  Eyes:     General: Vision grossly intact.     Conjunctiva/sclera:     Right eye: Right conjunctiva is injected.     Left eye: Left conjunctiva is injected.  Neck:     Thyroid: No thyromegaly.     Vascular: No carotid bruit or JVD.  Cardiovascular:     Rate and Rhythm: Normal rate and regular rhythm.     Heart sounds: Normal heart sounds. No murmur heard.   Pulmonary:     Effort: Pulmonary effort is normal.  No respiratory distress.     Breath sounds: Normal breath sounds. No wheezing or rales.  Chest:     Chest wall: No tenderness.  Musculoskeletal:     Cervical back: Normal range of motion and neck supple.  Neurological:     Mental Status: She is alert and oriented to person, place, and time.    BP 120/80 (BP Location: Right Arm, Patient Position: Sitting, Cuff Size: Normal)   Pulse (!) 117   Temp 99.3 F (37.4 C) (Oral)   Resp 18   Ht 5\' 3"  (1.6 m)    Wt 155 lb 12.8 oz (70.7 kg)   SpO2 95%   BMI 27.60 kg/m  Wt Readings from Last 3 Encounters:  02/18/20 155 lb 12.8 oz (70.7 kg)  09/23/19 150 lb (68 kg)  07/27/19 151 lb 8 oz (68.7 kg)     Lab Results  Component Value Date   WBC 4.9 07/27/2019   HGB 13.7 07/27/2019   HCT 40.7 07/27/2019   PLT 147 (L) 07/27/2019   GLUCOSE 109 (H) 07/27/2019   ALT 56 (H) 07/27/2019   AST 89 (H) 07/27/2019   NA 140 07/27/2019   K 3.8 07/27/2019   CL 101 07/27/2019   CREATININE 0.78 07/27/2019   BUN 9 07/27/2019   CO2 23 07/27/2019   INR 1.07 11/08/2011   HGBA1C 5.8 09/02/2017    MM DIAG BREAST TOMO BILATERAL  Result Date: 09/10/2019 CLINICAL DATA:  44 year old patient was diagnosed with right breast cancer in February 2020. She presents for annual bilateral mammogram. EXAM: DIGITAL DIAGNOSTIC BILATERAL MAMMOGRAM WITH CAD AND TOMO COMPARISON:  Previous mammograms, and prior breast MRI from October 04, 2018 ACR Breast Density Category c: The breast tissue is heterogeneously dense, which may obscure small masses. FINDINGS: There are lumpectomy changes in the posterior third of the outer right breast. No mass, nonsurgical distortion, or suspicious microcalcification is identified in either breast to suggest malignancy. Coil shaped biopsy clip in the outer left breast at the site of prior stereotactic biopsy (benign pathology results). Mammographic images were processed with CAD. IMPRESSION: No evidence of malignancy in either breast. Lumpectomy changes on the right. RECOMMENDATION: Diagnostic mammogram is suggested in 1 year. (Code:[id number]-01Y) I have discussed the findings and recommendations with the patient. If applicable, a reminder letter will be sent to the patient regarding the next appointment. BI-RADS CATEGORY  2: Benign. Electronically Signed   By: Curlene Dolphin M.D.   On: 09/10/2019 16:15     Assessment & Plan:  Plan  I have discontinued [name] D. Laham's ibuprofen, phenazopyridine, and  fluticasone. I am also having her start on Restasis. Additionally, I am having her maintain her levonorgestrel, valACYclovir, omeprazole, losartan, and tamoxifen.  Meds ordered this encounter  Medications  . cycloSPORINE (RESTASIS) 0.05 % ophthalmic emulsion    Sig: Place 1 drop into both eyes 2 (two) times daily.    Dispense:  0.4 mL    Refill:  2    Problem List Items Addressed This Visit    None    Visit Diagnoses    Dry eye syndrome of both eyes    -  Primary   Relevant Medications   cycloSPORINE (RESTASIS) 0.05 % ophthalmic emulsion    f/u eye dr if no better  Follow-up: Return if symptoms worsen or fail to improve.  Ann Held, DO

## 2020-02-22 NOTE — Telephone Encounter (Signed)
She is using otc drops with no relief  If that is not enough -- pt will need to go back to eye dr.  If she wants a referral to a different one just let me know

## 2020-02-22 NOTE — Telephone Encounter (Signed)
Medication is approved when Pt has tried other solutions or ointments for severe dry eyes at least three times daily, AND other treatments such as tears plugs or goggles.

## 2020-02-22 NOTE — Telephone Encounter (Signed)
PA denied. Awaiting denial information.  °

## 2020-02-23 MED ORDER — LOTEPREDNOL ETABONATE 0.5 % OP SUSP
1.0000 [drp] | Freq: Four times a day (QID) | OPHTHALMIC | 0 refills | Status: DC
Start: 2020-02-23 — End: 2020-03-17

## 2020-02-23 NOTE — Telephone Encounter (Signed)
Try lotemax 1 gtt in each eye qid x 1 week

## 2020-02-23 NOTE — Telephone Encounter (Signed)
Is there anything that can be called in until patient can be seen by eye doctor? Please advise

## 2020-02-23 NOTE — Telephone Encounter (Signed)
Patient states MEDICATION is not working. Patient aware of PA denial Restasis 0.05%. Patient would like to try another medication.  Please Advise

## 2020-02-23 NOTE — Telephone Encounter (Signed)
Pt called. Pt verbalized understanding. New Rx sent in. Pt states she will f/u with eye doctor

## 2020-02-25 ENCOUNTER — Telehealth: Payer: Self-pay | Admitting: Family Medicine

## 2020-02-25 NOTE — Telephone Encounter (Signed)
great

## 2020-02-25 NOTE — Telephone Encounter (Signed)
  loteprednol (LOTEMAX) 0.5 % ophthalmic suspension [[id number]]   Patient has watery eyes, patient states eye drop prescribed are to much $180.00.   Patient states that she will follow up with another eye doctor.   Please Advise, if we cn send in another type of eye drops.

## 2020-02-25 NOTE — Telephone Encounter (Signed)
FYI

## 2020-03-09 ENCOUNTER — Other Ambulatory Visit: Payer: Self-pay | Admitting: *Deleted

## 2020-03-09 DIAGNOSIS — R922 Inconclusive mammogram: Secondary | ICD-10-CM

## 2020-03-09 DIAGNOSIS — D0511 Intraductal carcinoma in situ of right breast: Secondary | ICD-10-CM

## 2020-03-09 DIAGNOSIS — Z803 Family history of malignant neoplasm of breast: Secondary | ICD-10-CM

## 2020-03-14 ENCOUNTER — Telehealth: Payer: Self-pay | Admitting: Family Medicine

## 2020-03-14 NOTE — Telephone Encounter (Signed)
Spoke with patient. Pt scheduled

## 2020-03-14 NOTE — Telephone Encounter (Signed)
Caller: [facility] Call back # [phone]  Patient feels a lumpy "ropelike texture" under right breast. Patient would like a referral to get testing done.

## 2020-03-14 NOTE — Telephone Encounter (Signed)
She needs app first-- we will have to order a diagnostic mammogram and I have to be able to say exactly where abnormality is  Needs to be in person

## 2020-03-14 NOTE — Telephone Encounter (Signed)
Do you want pt to follow up with you or oncology? Please advise

## 2020-03-17 ENCOUNTER — Other Ambulatory Visit: Payer: Self-pay

## 2020-03-17 ENCOUNTER — Other Ambulatory Visit: Payer: Self-pay | Admitting: Family Medicine

## 2020-03-17 ENCOUNTER — Encounter: Payer: Self-pay | Admitting: Family Medicine

## 2020-03-17 ENCOUNTER — Ambulatory Visit (INDEPENDENT_AMBULATORY_CARE_PROVIDER_SITE_OTHER): Payer: BC Managed Care – PPO | Admitting: Family Medicine

## 2020-03-17 VITALS — BP 118/70 | HR 91 | Temp 98.8°F | Resp 18 | Ht 63.0 in | Wt 153.8 lb

## 2020-03-17 DIAGNOSIS — N6459 Other signs and symptoms in breast: Secondary | ICD-10-CM

## 2020-03-17 NOTE — Patient Instructions (Signed)
° °Breast Cancer, Female ° °Breast cancer is a malignant growth of tissue (tumor) in the breast. Unlike noncancerous (benign) tumors, malignant tumors are cancerous and can spread to other parts of the body. The two most common types of breast cancer start in the milk ducts (ductal carcinoma) or in the lobules where milk is made in the breast (lobular carcinoma). Breast cancer is one of the most common types of cancer in women. °What are the causes? °The exact cause of female breast cancer is unknown. °What increases the risk? °The following factors may make you more likely to develop this condition: °· Being older than 44 years of age. °· Race and ethnicity. Caucasian women generally have an increased risk, but African-American women are more likely to develop the disease before age 45. °· Having a family history of breast cancer. °· Having had breast cancer in the past. °· Having certain noncancerous conditions of the breast, such as dense breast tissue. °· Having the BRCA1 and BRCA2 genes. °· Having a history of radiation exposure. °· Obesity. °· Starting menopause after age 55. °· Starting your menstrual periods before age 12. °· Having never been pregnant or having your first child after age 30. °· Having never breastfed. °· Using hormone therapy after menopause. °· Using birth control pills. °· Drinking more than one alcoholic drink a day. °· Exposure to the drug DES, which was given to pregnant women from the 1940s to the 1970s. °What are the signs or symptoms? °Symptoms of this condition include: °· A painless lump or thickening in your breast. °· Changes in the size or shape of your breast. °· Breast skin changes, such as puckering or dimpling. °· Nipple abnormalities, such as scaling, crustiness, redness, or pulling in (retraction). °· Nipple discharge that is bloody or clear. °How is this diagnosed? °This condition may be diagnosed by: °· Taking your medical history and doing a physical exam. During the  exam, your health care provider will feel the tissue around your breast and under your arms. °· Taking a sample of nipple discharge. The sample will be examined under a microscope. °· Performing imaging tests, such as breast X-rays (mammogram), breast ultrasound exams, or an MRI. °· Taking a tissue sample (biopsy) from the breast. The sample will be examined under a microscope to look for cancer cells. °· Taking a sample from the lymph nodes near the affected breast (sentinel node biopsy). °Your cancer will be staged to determine its severity and extent. Staging is a careful attempt to find out the size of the tumor, whether the cancer has spread, and if so, to what parts of the body. Staging also includes testing your tumor for certain receptors, such as estrogen, progesterone, and human epidermal growth factor receptor 2 (HER2). This will help your cancer care team decide on a treatment that will work best for you. You may need to have more tests to determine the stage of your cancer. Stages include the following: °· Stage 0--The tumor has not spread to other breast tissue. °· Stage I--The cancer is only found in the breast or may be in the lymph nodes. The tumor may be up to ¾ in (2 cm) wide. °· Stage II--The cancer has spread to nearby lymph nodes. The tumor may be up to 2 in (5 cm) wide. °· Stage III--The cancer has spread to more distant lymph nodes. The tumor may be larger than 2 in (5 cm) wide. °· Stage IV--The cancer has spread to other parts   the body, such as the bones, brain, liver, or lungs. How is this treated? Treatment for this condition depends on the type and stage of the breast cancer. It may be treated with:  Surgery. This may involve breast-conserving surgery (lumpectomy or partial mastectomy) in which only the part of the breast containing the cancer is removed. Some normal tissue surrounding this area may also be removed. In some cases, surgery may be done to remove the entire breast  (mastectomy) and nipple. Lymph nodes may also be removed.  Radiation therapy, which uses high-energy rays to kill cancer cells.  Chemotherapy, which is the use of drugs to kill cancer cells.  Hormone therapy, which involves taking medicine to adjust the hormone levels in your body. You may take medicine to decrease your estrogen levels. This can help stop cancer cells from growing.  Targeted therapy, in which drugs are used to block the growth and spread of cancer cells. These drugs target a specific part of the cancer cell and usually cause fewer side effects than chemotherapy. Targeted therapy may be used alone or in combination with chemotherapy.  A combination of surgery, radiation, chemotherapy, or hormone therapy may be needed to treat breast cancer. Follow these instructions at home:  Take over-the-counter and prescription medicines only as told by your health care provider.  Eat a healthy diet. A healthy diet includes lots of fruits and vegetables, low-fat dairy products, lean meats, and fiber. ? Make sure half your plate is filled with fruits or vegetables. ? Choose high-fiber foods such as whole-grain breads and cereals.  Consider joining a support group. This may help you learn to cope with the stress of having breast cancer.  Talk to your health care team about exercise and physical activity. The right exercise program can: ? Help prevent or reduce symptoms such as fatigue or depression. ? Improve overall health and survival rates.  Keep all follow-up visits as told by your health care provider. This is important. Where to find more information  American Cancer Society: www.cancer.[place]: www.cancer.gov Contact a health care provider if:  You have a sudden increase in pain.  You have any symptoms or changes that concern you.  You lose weight without trying.  You notice a new lump in either breast or under your arm.  You develop swelling in  either arm or hand.  You have a fever.  You notice new fatigue or weakness. Get help right away if:  You have chest pain or trouble breathing.  You faint. Summary  Breast cancer is a malignant growth of tissue (tumor) in the breast.  Your cancer will be staged to determine its severity and extent.  Treatment for this condition depends on the type and stage of the breast cancer. This information is not intended to replace advice given to you by your health care provider. Make sure you discuss any questions you have with your health care provider. Document Revised: 07/05/2017 Document Reviewed: 03/18/2017 Elsevier Patient Education  [place].

## 2020-03-17 NOTE — Progress Notes (Signed)
Patient ID: [name], female    DOB: 12/20/1975  Age: 44 y.o. MRN: [id number]    Subjective:  Subjective  HPI [name] presents for lump r breast that she noticed since her mammogram    Review of Systems  Constitutional: Negative for appetite change, diaphoresis, fatigue and unexpected weight change.  Eyes: Negative for pain, redness and visual disturbance.  Respiratory: Negative for cough, chest tightness, shortness of breath and wheezing.   Cardiovascular: Negative for chest pain, palpitations and leg swelling.  Endocrine: Negative for cold intolerance, heat intolerance, polydipsia, polyphagia and polyuria.  Genitourinary: Negative for difficulty urinating, dysuria and frequency.  Neurological: Negative for dizziness, light-headedness, numbness and headaches.     History Past Medical History:  Diagnosis Date   Anemia    Back pain    Bimalleolar ankle fracture    left   Breast cancer (HCC)    Family history of breast cancer    GERD (gastroesophageal reflux disease)    Hypertension    no meds   Kidney infection    1998- hosp.[facility]   Personal history of radiation therapy     She has a past surgical history that includes Cesarean section; Colonoscopy; Wisdom tooth extraction; Anterior lumbar fusion (11/14/2011); Spine surgery; Lumbar laminectomy/decompression microdiscectomy (Right, 12/15/2015); Upper gastrointestinal endoscopy; Back surgery; ORIF ankle fracture (Left, 10/03/2017); Breast lumpectomy with radioactive seed localization (Right, 01/13/2019); and Breast lumpectomy (Right, 01/2019).   Her family history includes Breast cancer in her maternal aunt; Lupus in her mother.She reports that she quit smoking about 2 years ago. Her smoking use included e-cigarettes and cigarettes. She has a 3.75 pack-year smoking history. She has never used smokeless tobacco. She reports current alcohol use. She reports that she does not use drugs.  Current  Outpatient Medications on File Prior to Visit  Medication Sig Dispense Refill   levonorgestrel (MIRENA) 20 MCG/24HR IUD 1 each by Intrauterine route once.     losartan (COZAAR) 25 MG tablet Take 1 tablet (25 mg total) by mouth daily. Pt needs OV for further refills 90 tablet 1   omeprazole (PRILOSEC) 40 MG capsule TAKE 1 CAPSULE BY MOUTH TWICE A DAY 60 capsule 11   tamoxifen (NOLVADEX) 20 MG tablet tamoxifen 20 mg tablet  TAKE 1 TABLET BY MOUTH EVERY DAY     valACYclovir (VALTREX) 1000 MG tablet Take 1 tablet (1,000 mg total) by mouth 3 (three) times daily as needed. For flare ups 90 tablet 2   [DISCONTINUED] Norgestim-Eth Estrad Triphasic (ORTHO TRI-CYCLEN, 28, PO) Take 0.035 mg by mouth daily.      No current facility-administered medications on file prior to visit.     Objective:  Objective  Physical Exam Vitals and nursing note reviewed.  Chest:     Chest wall: Mass present.     Breasts:        Right: Mass present. No tenderness.        Left: Normal.      BP 118/70 (BP Location: Right Arm, Patient Position: Sitting, Cuff Size: Normal)    Pulse 91    Temp 98.8 F (37.1 C) (Oral)    Resp 18    Ht 5\' 3"  (1.6 m)    Wt 153 lb 12.8 oz (69.8 kg)    SpO2 100%    BMI 27.24 kg/m  Wt Readings from Last 3 Encounters:  03/17/20 153 lb 12.8 oz (69.8 kg)  02/18/20 155 lb 12.8 oz (70.7 kg)  09/23/19 150 lb (68 kg)  Lab Results  Component Value Date   WBC 4.9 07/27/2019   HGB 13.7 07/27/2019   HCT 40.7 07/27/2019   PLT 147 (L) 07/27/2019   GLUCOSE 109 (H) 07/27/2019   ALT 56 (H) 07/27/2019   AST 89 (H) 07/27/2019   NA 140 07/27/2019   K 3.8 07/27/2019   CL 101 07/27/2019   CREATININE 0.78 07/27/2019   BUN 9 07/27/2019   CO2 23 07/27/2019   INR 1.07 11/08/2011   HGBA1C 5.8 09/02/2017    MM DIAG BREAST TOMO BILATERAL  Result Date: 09/10/2019 CLINICAL DATA:  44 year old patient was diagnosed with right breast cancer in February 2020. She presents for annual bilateral  mammogram. EXAM: DIGITAL DIAGNOSTIC BILATERAL MAMMOGRAM WITH CAD AND TOMO COMPARISON:  Previous mammograms, and prior breast MRI from October 04, 2018 ACR Breast Density Category c: The breast tissue is heterogeneously dense, which may obscure small masses. FINDINGS: There are lumpectomy changes in the posterior third of the outer right breast. No mass, nonsurgical distortion, or suspicious microcalcification is identified in either breast to suggest malignancy. Coil shaped biopsy clip in the outer left breast at the site of prior stereotactic biopsy (benign pathology results). Mammographic images were processed with CAD. IMPRESSION: No evidence of malignancy in either breast. Lumpectomy changes on the right. RECOMMENDATION: Diagnostic mammogram is suggested in 1 year. (Code:[id number]) I have discussed the findings and recommendations with the patient. If applicable, a reminder letter will be sent to the patient regarding the next appointment. BI-RADS CATEGORY  2: Benign. Electronically Signed   By: Curlene Dolphin M.D.   On: 09/10/2019 16:15     Assessment & Plan:  Plan  I have discontinued [name] D. Stettner's Restasis and loteprednol. I am also having her maintain her levonorgestrel, valACYclovir, omeprazole, losartan, and tamoxifen.  No orders of the defined types were placed in this encounter.   Problem List Items Addressed This Visit    None    Visit Diagnoses    Breast thickening    -  Primary   Relevant Orders   MM DIAG BREAST TOMO UNI RIGHT    pt already has an mri scheduled--- she is going to see if she can get that moved up --- if not we will try to get a diagnostic mammogram Follow-up: Return if symptoms worsen or fail to improve.  Ann Held, DO

## 2020-03-27 NOTE — Progress Notes (Signed)
No show

## 2020-03-28 ENCOUNTER — Inpatient Hospital Stay: Payer: BC Managed Care – PPO

## 2020-03-28 ENCOUNTER — Inpatient Hospital Stay (HOSPITAL_BASED_OUTPATIENT_CLINIC_OR_DEPARTMENT_OTHER): Payer: BC Managed Care – PPO | Admitting: Oncology

## 2020-03-28 DIAGNOSIS — D0511 Intraductal carcinoma in situ of right breast: Secondary | ICD-10-CM

## 2020-03-31 ENCOUNTER — Ambulatory Visit
Admission: RE | Admit: 2020-03-31 | Discharge: 2020-03-31 | Disposition: A | Discharge status: Home / Self Care | Payer: BC Managed Care – PPO | Source: Ambulatory Visit | Attending: Family Medicine | Admitting: Family Medicine

## 2020-03-31 ENCOUNTER — Other Ambulatory Visit: Payer: Self-pay

## 2020-03-31 DIAGNOSIS — N6489 Other specified disorders of breast: Secondary | ICD-10-CM | POA: Diagnosis not present

## 2020-03-31 DIAGNOSIS — N6459 Other signs and symptoms in breast: Secondary | ICD-10-CM

## 2020-03-31 DIAGNOSIS — Z86000 Personal history of in-situ neoplasm of breast: Secondary | ICD-10-CM | POA: Diagnosis not present

## 2020-04-02 ENCOUNTER — Other Ambulatory Visit: Payer: BC Managed Care – PPO

## 2020-04-04 ENCOUNTER — Inpatient Hospital Stay: Payer: BC Managed Care – PPO | Admitting: Medical

## 2020-04-04 ENCOUNTER — Inpatient Hospital Stay: Payer: BC Managed Care – PPO

## 2020-04-05 ENCOUNTER — Other Ambulatory Visit: Payer: Self-pay

## 2020-04-05 ENCOUNTER — Inpatient Hospital Stay: Payer: BC Managed Care – PPO | Admitting: Medical

## 2020-04-05 ENCOUNTER — Inpatient Hospital Stay: Payer: BC Managed Care – PPO | Attending: Oncology

## 2020-04-05 ENCOUNTER — Telehealth: Payer: Self-pay | Admitting: Medical

## 2020-04-05 ENCOUNTER — Encounter: Payer: Self-pay | Admitting: Medical

## 2020-04-05 VITALS — BP 118/86 | HR 87 | Temp 98.6°F | Resp 18 | Ht 63.0 in | Wt 153.7 lb

## 2020-04-05 DIAGNOSIS — D0511 Intraductal carcinoma in situ of right breast: Secondary | ICD-10-CM | POA: Insufficient documentation

## 2020-04-05 DIAGNOSIS — E782 Mixed hyperlipidemia: Secondary | ICD-10-CM | POA: Insufficient documentation

## 2020-04-05 DIAGNOSIS — Z79810 Long term (current) use of selective estrogen receptor modulators (SERMs): Secondary | ICD-10-CM | POA: Insufficient documentation

## 2020-04-05 LAB — CBC WITH DIFFERENTIAL/PLATELET
Abs Immature Granulocytes: 0.01 10*3/uL (ref 0.00–0.07)
Basophils Absolute: 0 10*3/uL (ref 0.0–0.1)
Basophils Relative: 1 %
Eosinophils Absolute: 0.1 10*3/uL (ref 0.0–0.5)
Eosinophils Relative: 2 %
HCT: 37.4 % (ref 36.0–46.0)
Hemoglobin: 12.9 g/dL (ref 12.0–15.0)
Immature Granulocytes: 0 %
Lymphocytes Relative: 29 %
Lymphs Abs: 0.9 10*3/uL (ref 0.7–4.0)
MCH: 34.3 pg — ABNORMAL HIGH (ref 26.0–34.0)
MCHC: 34.5 g/dL (ref 30.0–36.0)
MCV: 99.5 fL (ref 80.0–100.0)
Monocytes Absolute: 0.5 10*3/uL (ref 0.1–1.0)
Monocytes Relative: 15 %
Neutro Abs: 1.6 10*3/uL — ABNORMAL LOW (ref 1.7–7.7)
Neutrophils Relative %: 53 %
Platelets: 151 10*3/uL (ref 150–400)
RBC: 3.76 MIL/uL — ABNORMAL LOW (ref 3.87–5.11)
RDW: 13.1 % (ref 11.5–15.5)
WBC: 3 10*3/uL — ABNORMAL LOW (ref 4.0–10.5)
nRBC: 0 % (ref 0.0–0.2)

## 2020-04-05 LAB — COMPREHENSIVE METABOLIC PANEL
ALT: 41 U/L (ref 0–44)
AST: 73 U/L — ABNORMAL HIGH (ref 15–41)
Albumin: 4.2 g/dL (ref 3.5–5.0)
Alkaline Phosphatase: 38 U/L (ref 38–126)
Anion gap: 9 (ref 5–15)
BUN: 9 mg/dL (ref 6–20)
CO2: 26 mmol/L (ref 22–32)
Calcium: 9.7 mg/dL (ref 8.9–10.3)
Chloride: 102 mmol/L (ref 98–111)
Creatinine, Ser: 0.71 mg/dL (ref 0.44–1.00)
GFR calc Af Amer: >60 mL/min (ref >60)
GFR calc non Af Amer: >60 mL/min (ref >60)
Glucose, Bld: 103 mg/dL — ABNORMAL HIGH (ref 70–99)
Potassium: 3.6 mmol/L (ref 3.5–5.1)
Sodium: 137 mmol/L (ref 135–145)
Total Bilirubin: 0.4 mg/dL (ref 0.3–1.2)
Total Protein: 7.9 g/dL (ref 6.5–8.1)

## 2020-04-05 LAB — LIPID PANEL
Cholesterol: 202 mg/dL — ABNORMAL HIGH (ref 0–200)
HDL: 53 mg/dL (ref >40)
LDL Cholesterol: 109 mg/dL — ABNORMAL HIGH (ref 0–99)
Total CHOL/HDL Ratio: 3.8 RATIO
Triglycerides: 199 mg/dL — ABNORMAL HIGH (ref <150)
VLDL: 40 mg/dL (ref 0–40)

## 2020-04-05 NOTE — Telephone Encounter (Signed)
Scheduled per los. Declined printout  

## 2020-04-12 NOTE — Progress Notes (Signed)
[facility] Progress Note   [name] [id number] Jul 16, 1976 44 y.o.  [name] is managed by Dr. Jana Hakim  Actively treated with chemotherapy/immunotherapy/hormonal therapy: yes  Current therapy: Tamoxifen  Next scheduled appointment with provider: 11/03/2020  Assessment: Plan:    Ductal carcinoma in situ (DCIS) of right breast - Plan: CBC with Differential ([place] Only), CMP ([place] only)  Mixed hyperlipidemia - Plan: Lipid panel   Ductal carcinoma in situ of the right breast: [name] presents to the office today for a routine physical exam.  She continues on tamoxifen 20 mg once daily.  Her next mammogram is scheduled for February.  She will return in March 2022 to see Dr. Jana Hakim in follow-up.  Mixed hyperlipidemia: A lipid panel will be collected on the patient's return.  Please see After Visit Summary for patient specific instructions.  Future Appointments  Date Time Provider [place]  11/03/2020  9:30 AM [facility] None  11/03/2020 10:00 AM [name], [name], MD [facility] None    Orders Placed This Encounter  Procedures  . CBC with Differential ([place] Only)  . CMP ([place] only)  . Lipid panel       Subjective:   Patient ID:  [name] is a 44 y.o. (DOB 1975-11-17) female.  Chief Complaint: No chief complaint on file.   HPI [name] is a 44 y.o. female with a diagnosis of a ductal carcinoma in situ of the right breast.  She presents to the office today for a routine physical exam having last been seen by Dr. Jana Hakim on 07/27/2019.  She continues on tamoxifen 20 mg once daily.   She continues to have stable hot flashes.  She had a diagnostic mammogram completed all of her right breast last week for assessment of a indurated area along the inferior medial edge of her breast.  She was told that this was an inframammary  ridge.  Medications: I have reviewed the patient's current medications.  Allergies:  Allergies  Allergen Reactions  . Tamiflu [Oseltamivir Phosphate] Other (See Comments)    Seizure  . Ciprofloxacin Diarrhea and Nausea And Vomiting  . Metoclopramide Hcl Other (See Comments)    "lock jaw"  . Sulfa Antibiotics Nausea And Vomiting    Past Medical History:  Diagnosis Date  . Anemia   . Back pain   . Bimalleolar ankle fracture    left  . Breast cancer ([place])   . Family history of breast cancer   . GERD (gastroesophageal reflux disease)   . Hypertension    no meds  . Kidney infection    1998- hosp.- [facility]  . Personal history of radiation therapy     Past Surgical History:  Procedure Laterality Date  . ANTERIOR LUMBAR FUSION  11/14/2011   Procedure: ANTERIOR LUMBAR FUSION 2 LEVELS;  Surgeon: Johnn Hai, MD;  Location: [place];  Service: Orthopedics;  Laterality: N/A;  ALIF L5-S1  . BACK SURGERY    . BREAST LUMPECTOMY Right 01/2019  . BREAST LUMPECTOMY WITH RADIOACTIVE SEED LOCALIZATION Right 01/13/2019   Procedure: RIGHT BREAST LUMPECTOMY WITH RADIOACTIVE SEED LOCALIZATION X3;  Surgeon: Erroll Luna, MD;  Location: [place];  Service: General;  Laterality: Right;  . CESAREAN SECTION     2007- /w epidural  anesthesia   . COLONOSCOPY    . LUMBAR LAMINECTOMY/DECOMPRESSION MICRODISCECTOMY Right 12/15/2015   Procedure:  MICO LUMBER DECOMPRESSION L4-5 ON THE RIGHT, ;  Surgeon:  Susa Day, MD;  Location: [facility];  Service: Orthopedics;  Laterality: Right;  . ORIF ANKLE FRACTURE Left 10/03/2017   Procedure: OPEN REDUCTION INTERNAL FIXATION (ORIF) ANKLE BIMALLEOLAR FRACTURE; possible deltoid ligament repair;  Surgeon: Wylene Simmer, MD;  Location: [place];  Service: Orthopedics;  Laterality: Left;  . SPINE SURGERY     fusion  . UPPER GASTROINTESTINAL ENDOSCOPY    . WISDOM TOOTH EXTRACTION     young adult    Family History  Problem Relation Age  of Onset  . Lupus Mother   . Breast cancer Maternal Aunt   . Anesthesia problems Neg Hx   . Colon cancer Neg Hx   . Colon polyps Neg Hx   . Esophageal cancer Neg Hx   . Rectal cancer Neg Hx   . Stomach cancer Neg Hx     Social History   Socioeconomic History  . Marital status: Married    Spouse name: Not on file  . Number of children: 1  . Years of education: Not on file  . Highest education level: Not on file  Occupational History    Comment: working full time  Tobacco Use  . Smoking status: Former Smoker    Packs/day: 0.25    Years: 15.00    Pack years: 3.75    Types: E-cigarettes, Cigarettes    Quit date: 04/01/2017    Years since quitting: 3.0  . Smokeless tobacco: Never Used  . Tobacco comment: vapes daily  Vaping Use  . Vaping Use: Every day  . Substances: Nicotine  Substance and Sexual Activity  . Alcohol use: Yes    Alcohol/week: 0.0 standard drinks    Comment: a glass of wine  on occas.   . Drug use: No  . Sexual activity: Yes    Birth control/protection: I.U.D.  Other Topics Concern  . Not on file  Social History Narrative  . Not on file   Social Determinants of Health   Financial Resource Strain:   . Difficulty of Paying Living Expenses: Not on file  Food Insecurity:   . Worried About Charity fundraiser in the Last Year: Not on file  . Ran Out of Food in the Last Year: Not on file  Transportation Needs:   . Lack of Transportation (Medical): Not on file  . Lack of Transportation (Non-Medical): Not on file  Physical Activity:   . Days of Exercise per Week: Not on file  . Minutes of Exercise per Session: Not on file  Stress:   . Feeling of Stress : Not on file  Social Connections:   . Frequency of Communication with Friends and Family: Not on file  . Frequency of Social Gatherings with Friends and Family: Not on file  . Attends Religious Services: Not on file  . Active Member of Clubs or Organizations: Not on file  . Attends Theatre manager Meetings: Not on file  . Marital Status: Not on file  Intimate Partner Violence:   . Fear of Current or Ex-Partner: Not on file  . Emotionally Abused: Not on file  . Physically Abused: Not on file  . Sexually Abused: Not on file    Past Medical History, Surgical history, Social history, and Family history were reviewed and updated as appropriate.   Please see review of systems for further details on the patient's review from today.   Review of Systems:  Review of Systems  Constitutional: Negative for activity change, chills, diaphoresis and fever.  Hot flashes  HENT: Negative for trouble swallowing.   Respiratory: Negative for cough, chest tightness and shortness of breath.   Cardiovascular: Negative for chest pain, palpitations and leg swelling.  Musculoskeletal: Negative for back pain, gait problem, joint swelling and myalgias.  Skin: Negative for color change, rash and wound.  Neurological: Negative for dizziness and headaches.    Objective:   Physical Exam:  BP 118/86 (BP Location: Right Arm, Patient Position: Sitting)   Pulse 87   Temp 98.6 F (37 C) (Tympanic)   Resp 18   Ht 5\' 3"  (1.6 m)   Wt 153 lb 11.2 oz (69.7 kg)   SpO2 100% Comment: RA  BMI 27.23 kg/m  ECOG: 0  Physical Exam Constitutional:      General: She is not in acute distress.    Appearance: She is not diaphoretic.  HENT:     Head: Normocephalic and atraumatic.  Cardiovascular:     Rate and Rhythm: Normal rate and regular rhythm.     Heart sounds: Normal heart sounds. No murmur heard.  No friction rub. No gallop.      Comments: Nipples are everted bilaterally and are without discharge. No masses, nodules, tenderness, or skin changes noted. Pulmonary:     Effort: Pulmonary effort is normal. No respiratory distress.     Breath sounds: Normal breath sounds. No stridor. No wheezing or rales.  Musculoskeletal:       Arms:     Cervical back: Normal range of motion and neck  supple.     Right lower leg: No edema.     Left lower leg: No edema.  Lymphadenopathy:     Head:     Right side of head: No submandibular, tonsillar, preauricular, posterior auricular or occipital adenopathy.     Left side of head: No submandibular, tonsillar, preauricular, posterior auricular or occipital adenopathy.     Cervical: No cervical adenopathy.     Upper Body:     Right upper body: No supraclavicular or epitrochlear adenopathy.     Left upper body: No supraclavicular or epitrochlear adenopathy.  Skin:    General: Skin is warm and dry.     Coloration: Skin is not jaundiced or pale.     Findings: No bruising, erythema, lesion or rash.  Neurological:     Mental Status: She is alert.     Coordination: Coordination normal.  Psychiatric:        Behavior: Behavior normal.        Thought Content: Thought content normal.        Judgment: Judgment normal.     Lab Review:     Component Value Date/Time   NA 137 04/05/2020 1011   K 3.6 04/05/2020 1011   CL 102 04/05/2020 1011   CO2 26 04/05/2020 1011   GLUCOSE 103 (H) 04/05/2020 1011   BUN 9 04/05/2020 1011   CREATININE 0.71 04/05/2020 1011   CREATININE 0.78 07/27/2019 1133   CALCIUM 9.7 04/05/2020 1011   PROT 7.9 04/05/2020 1011   ALBUMIN 4.2 04/05/2020 1011   AST 73 (H) 04/05/2020 1011   AST 89 (H) 07/27/2019 1133   ALT 41 04/05/2020 1011   ALT 56 (H) 07/27/2019 1133   ALKPHOS 38 04/05/2020 1011   BILITOT 0.4 04/05/2020 1011   BILITOT 0.6 07/27/2019 1133   GFRNONAA >60 04/05/2020 1011   GFRNONAA >60 07/27/2019 1133   GFRAA >60 04/05/2020 1011   GFRAA >60 07/27/2019 1133       Component Value  Date/Time   WBC 3.0 (L) 04/05/2020 1011   RBC 3.76 (L) 04/05/2020 1011   HGB 12.9 04/05/2020 1011   HGB 13.7 07/27/2019 1133   HCT 37.4 04/05/2020 1011   PLT 151 04/05/2020 1011   PLT 147 (L) 07/27/2019 1133   MCV 99.5 04/05/2020 1011   MCH 34.3 (H) 04/05/2020 1011   MCHC 34.5 04/05/2020 1011   RDW 13.1 04/05/2020  1011   LYMPHSABS 0.9 04/05/2020 1011   MONOABS 0.5 04/05/2020 1011   EOSABS 0.1 04/05/2020 1011   BASOSABS 0.0 04/05/2020 1011   -------------------------------  Imaging from last 24 hours (if applicable):  Radiology interpretation: US BREAST LTD UNI RIGHT INC AXILLA  Result Date: 03/31/2020 CLINICAL DATA:  44 year old female with palpable thickening along the LOWER RIGHT breast identified on self-examination. History of RIGHT breast DCIS and lumpectomy in 2020. EXAM: DIGITAL DIAGNOSTIC RIGHT MAMMOGRAM WITH CAD AND TOMO ULTRASOUND RIGHT BREAST COMPARISON:  Previous exam(s). ACR Breast Density Category c: The breast tissue is heterogeneously dense, which may obscure small masses. FINDINGS: 2D/3D full field views of the RIGHT breast demonstrate lumpectomy changes within the UPPER-OUTER RIGHT breast. No suspicious mass, nonsurgical distortion or worrisome calcifications are noted. Mammographic images were processed with CAD. On physical exam, thickening along the INFERIOR RIGHT breast identified. Targeted ultrasound is performed, showing mild skin thickening of the LOWER RIGHT breast without discrete mass, nonsurgical distortion or worrisome shadowing. IMPRESSION: 1. Post treatment changes in the LOWER RIGHT breast without suspicious sonographic or mammographic abnormalities. RECOMMENDATION: Bilateral diagnostic mammogram in 6 months to resume annual mammogram schedule. I have discussed the findings and recommendations with the patient. If applicable, a reminder letter will be sent to the patient regarding the next appointment. BI-RADS CATEGORY  2: Benign. Electronically Signed   By: Margarette Canada M.D.   On: 03/31/2020 11:35   MM DIAG BREAST TOMO UNI RIGHT  Result Date: 03/31/2020 CLINICAL DATA:  44 year old female with palpable thickening along the LOWER RIGHT breast identified on self-examination. History of RIGHT breast DCIS and lumpectomy in 2020. EXAM: DIGITAL DIAGNOSTIC RIGHT MAMMOGRAM WITH CAD AND  TOMO ULTRASOUND RIGHT BREAST COMPARISON:  Previous exam(s). ACR Breast Density Category c: The breast tissue is heterogeneously dense, which may obscure small masses. FINDINGS: 2D/3D full field views of the RIGHT breast demonstrate lumpectomy changes within the UPPER-OUTER RIGHT breast. No suspicious mass, nonsurgical distortion or worrisome calcifications are noted. Mammographic images were processed with CAD. On physical exam, thickening along the INFERIOR RIGHT breast identified. Targeted ultrasound is performed, showing mild skin thickening of the LOWER RIGHT breast without discrete mass, nonsurgical distortion or worrisome shadowing. IMPRESSION: 1. Post treatment changes in the LOWER RIGHT breast without suspicious sonographic or mammographic abnormalities. RECOMMENDATION: Bilateral diagnostic mammogram in 6 months to resume annual mammogram schedule. I have discussed the findings and recommendations with the patient. If applicable, a reminder letter will be sent to the patient regarding the next appointment. BI-RADS CATEGORY  2: Benign. Electronically Signed   By: Margarette Canada M.D.   On: 03/31/2020 11:35

## 2020-04-29 ENCOUNTER — Other Ambulatory Visit: Payer: Self-pay | Admitting: *Deleted

## 2020-04-29 DIAGNOSIS — R922 Inconclusive mammogram: Secondary | ICD-10-CM

## 2020-04-29 DIAGNOSIS — D0511 Intraductal carcinoma in situ of right breast: Secondary | ICD-10-CM

## 2020-05-05 ENCOUNTER — Other Ambulatory Visit: Payer: Self-pay

## 2020-05-05 ENCOUNTER — Ambulatory Visit
Admission: RE | Admit: 2020-05-05 | Discharge: 2020-05-05 | Disposition: A | Discharge status: Home / Self Care | Payer: BC Managed Care – PPO | Source: Ambulatory Visit | Attending: Oncology | Admitting: Oncology

## 2020-05-05 DIAGNOSIS — N6489 Other specified disorders of breast: Secondary | ICD-10-CM | POA: Diagnosis not present

## 2020-05-05 DIAGNOSIS — D0511 Intraductal carcinoma in situ of right breast: Secondary | ICD-10-CM

## 2020-05-05 DIAGNOSIS — R9230 Dense breasts, unspecified: Secondary | ICD-10-CM

## 2020-05-05 DIAGNOSIS — R922 Inconclusive mammogram: Secondary | ICD-10-CM

## 2020-05-05 MED ORDER — GADOBUTROL 1 MMOL/ML IV SOLN
7.0000 mL | Freq: Once | INTRAVENOUS | Status: AC | PRN
  Administered 2020-05-05: 7 mL via INTRAVENOUS

## 2020-05-13 ENCOUNTER — Other Ambulatory Visit: Payer: Self-pay | Admitting: Family Medicine

## 2020-05-13 DIAGNOSIS — I10 Essential (primary) hypertension: Secondary | ICD-10-CM

## 2020-05-19 ENCOUNTER — Other Ambulatory Visit: Payer: BC Managed Care – PPO

## 2020-06-04 ENCOUNTER — Other Ambulatory Visit: Payer: Self-pay | Admitting: Gastroenterology

## 2020-06-04 DIAGNOSIS — K219 Gastro-esophageal reflux disease without esophagitis: Secondary | ICD-10-CM

## 2020-06-04 DIAGNOSIS — K22719 Barrett's esophagus with dysplasia, unspecified: Secondary | ICD-10-CM

## 2020-06-29 ENCOUNTER — Other Ambulatory Visit: Payer: Self-pay | Admitting: Family Medicine

## 2020-09-29 ENCOUNTER — Other Ambulatory Visit: Payer: Self-pay | Admitting: Oncology

## 2020-11-02 ENCOUNTER — Telehealth: Payer: Self-pay | Admitting: Oncology

## 2020-11-02 NOTE — Progress Notes (Incomplete)
[place]  [phone] [phone]    ID: Mylasia Vorhees Deguia DOB: [birth year]  MR#: [id number]  CHY#:[id number]  Patient Care Team: Carollee Herter, Alferd Apa, DO as PCP - General Susa Day, MD (Orthopedic Surgery) Mauro Kaufmann, RN as Oncology Nurse Navigator Rockwell Germany, RN as Oncology Nurse Navigator Erroll Luna, MD as Consulting Physician (General Surgery) [name], [name], MD as Consulting Physician (Oncology) Kyung Rudd, MD as Consulting Physician (Radiation Oncology) Everlene Farrier, MD as Consulting Physician (Obstetrics and Gynecology) Wylene Simmer, MD as Consulting Physician (Orthopedic Surgery) OTHER MD:    CHIEF COMPLAINT: 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 She was last seen in 07/2019.  She started on tamoxifen on 05/07/2019.  She is tolerating this remarkably well, with no problems related to hot flashes or any other new symptoms.  Since her last visit, she underwent bilateral diagnostic mammography with tomography at [facility] on 09/10/2019 showing: breast density category C; no evidence of malignancy in either breast.  She presented to her PCP on 03/17/2020 with a new right breast lump. She underwent right diagnostic mammography and right breast ultrasonography at [facility] on 03/31/2020 showing: breast density category C; post-treatment changes in lower right breast without suspicious abnormalities.  She also underwent breast MRI on 05/05/2020 showing: breast composition C; no evidence of malignancy.   REVIEW OF SYSTEMS: [name]    COVID 45 VACCINATION STATUS: fully vaccinated [employer]), with booster 08/2020   HISTORY OF CURRENT ILLNESS: From the original intake note:  "[name]" Dreamer Carillo underwent bilateral diagnostic  mammography with tomography at [facility] on 09/12/2018 showing: Breast Density Category C.   In the right breast, magnified views demonstrate a group of fine pleomorphic calcifications in the lower outer quadrant of the right breast spanning 2.8 x 1.7 x 5.0 cm. Calcifications are not typical for fat necrosis.   Accordingly on 09/17/2018 she proceeded to biopsy of the right breast area in question. The pathology from this procedure showed ([id number]): ductal carcinoma in situ with calcifications, high grade. Prognostic indicators significant for: estrogen receptor, 0% negative and progesterone receptor, 0% negative.   In the left breast, magnified views of the left breast calcifications demonstrate a group of faint pleomorphic calcifications in the upper-outer quadrant measuring approximately 0.8 cm in diameter. Calcifications are not typical for fat necrosis.   Accordingly, she underwent a biopsy of the right breast area in question on 09/17/2018. The pathology from this procedure showed ([id number]): fibrocystic changes with adenosis and calcifications. There is no evidence of malignancy.    The patient's subsequent history is as detailed below.   PAST MEDICAL HISTORY: Past Medical History:  Diagnosis Date  . Anemia   . Back pain   . Bimalleolar ankle fracture    left  . Breast cancer ([place])   . Family history of breast cancer   . GERD (gastroesophageal reflux disease)   . Hypertension    no meds  . Kidney infection    1998- hosp.- [facility]  . Personal history of radiation therapy     PAST SURGICAL HISTORY: Past Surgical History:  Procedure Laterality Date  . ANTERIOR LUMBAR FUSION  11/14/2011   Procedure: ANTERIOR LUMBAR FUSION 2 LEVELS;  Surgeon: Johnn Hai, MD;  Location: [facility];  Service: Orthopedics;  Laterality: N/A;  ALIF L5-S1  .  BACK SURGERY    . BREAST LUMPECTOMY Right 01/2019  . BREAST LUMPECTOMY WITH RADIOACTIVE SEED LOCALIZATION Right 01/13/2019   Procedure:  RIGHT BREAST LUMPECTOMY WITH RADIOACTIVE SEED LOCALIZATION X3;  Surgeon: Erroll Luna, MD;  Location: [facility];  Service: General;  Laterality: Right;  . CESAREAN SECTION     2007- /w epidural  anesthesia   . COLONOSCOPY    . LUMBAR LAMINECTOMY/DECOMPRESSION MICRODISCECTOMY Right 12/15/2015   Procedure:  MICO LUMBER DECOMPRESSION L4-5 ON THE RIGHT, ;  Surgeon: Susa Day, MD;  Location: [facility];  Service: Orthopedics;  Laterality: Right;  . ORIF ANKLE FRACTURE Left 10/03/2017   Procedure: OPEN REDUCTION INTERNAL FIXATION (ORIF) ANKLE BIMALLEOLAR FRACTURE; possible deltoid ligament repair;  Surgeon: Wylene Simmer, MD;  Location: [facility];  Service: Orthopedics;  Laterality: Left;  . SPINE SURGERY     fusion  . UPPER GASTROINTESTINAL ENDOSCOPY    . WISDOM TOOTH EXTRACTION     young adult    FAMILY HISTORY: Family History  Problem Relation Age of Onset  . Lupus Mother   . Breast cancer Maternal Aunt   . Anesthesia problems Neg Hx   . Colon cancer Neg Hx   . Colon polyps Neg Hx   . Esophageal cancer Neg Hx   . Rectal cancer Neg Hx   . Stomach cancer Neg Hx   [name] are both 70 as of 09/2018. The patient has 2 sisters. [name] paternal aunt was diagnosed with breast cancer at 23. Patient denies anyone in her family having breast, ovarian, prostate, or pancreatic cancer.    GYNECOLOGIC HISTORY:  No LMP recorded. (Menstrual status: IUD). 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. Her husband, [name] "BJ" Hanaway, is a Freight forwarder for a Haematologist. Their son, Elinore Shults, is 7 years old. She has a pit bull. [name], [name], is a retired Consulting civil engineer.    ADVANCED DIRECTIVES: Her husband, BJ, is  automatically her healthcare power of attorney.     HEALTH MAINTENANCE: Social History   Tobacco Use  . Smoking status: Former Smoker    Packs/day: 0.25    Years: 15.00    Pack years: 3.75    Types: E-cigarettes, Cigarettes    Quit date: 04/01/2017    Years since quitting: 3.5  . Smokeless tobacco: Never Used  . Tobacco comment: vapes daily  Vaping Use  . Vaping Use: Every day  . Substances: Nicotine  Substance Use Topics  . Alcohol use: Yes    Alcohol/week: 0.0 standard drinks    Comment: a glass of wine  on occas.   . Drug use: No    Colonoscopy: yes, 2005  PAP: 09/12/2018  Bone density: no   Allergies  Allergen Reactions  . Tamiflu [Oseltamivir Phosphate] Other (See Comments)    Seizure  . Ciprofloxacin Diarrhea and Nausea And Vomiting  . Metoclopramide Hcl Other (See Comments)    "lock jaw"  . Sulfa Antibiotics Nausea And Vomiting    Current Outpatient Medications  Medication Sig Dispense Refill  . levonorgestrel (MIRENA) 20 MCG/24HR IUD 1 each by Intrauterine route once.    Marland Kitchen losartan (COZAAR) 25 MG tablet TAKE 1 TABLET (25 MG TOTAL) BY MOUTH DAILY. PT NEEDS OV FOR FURTHER REFILLS  90 tablet 1  . omeprazole (PRILOSEC) 40 MG capsule TAKE 1 CAPSULE BY MOUTH TWICE A DAY 60 capsule 11  . tamoxifen (NOLVADEX) 20 MG tablet TAKE 1 TABLET BY MOUTH EVERY DAY 90 tablet 0  . valACYclovir (VALTREX) 1000 MG tablet Take 1 tablet (1,000 mg total) by mouth 3 (three) times daily as needed. For flare ups 90 tablet 2   No current facility-administered medications for this visit.     OBJECTIVE: Young white woman in no acute distress  There were no vitals filed for this visit.   There is no height or weight on file to calculate BMI.   Wt Readings from Last 3 Encounters:  04/05/20 153 lb 11.2 oz (69.7 kg)  03/17/20 153 lb 12.8 oz (69.8 kg)  02/18/20 155 lb 12.8 oz (70.7 kg)      ECOG FS:1 - Symptomatic but completely ambulatory  Sclerae unicteric, EOMs intact Wearing a  mask No cervical or supraclavicular adenopathy Lungs no rales or rhonchi Heart regular rate and rhythm Abd soft, nontender, positive bowel sounds MSK no focal spinal tenderness, no upper extremity lymphedema Neuro: nonfocal, well oriented, appropriate affect Breasts:    {Sclerae unicteric, EOMs intact Wearing a mask No cervical or supraclavicular adenopathy Lungs no rales or rhonchi Heart regular rate and rhythm Abd soft, nontender, positive bowel sounds MSK no focal spinal tenderness, no upper extremity lymphedema Neuro: nonfocal, well oriented, appropriate affect Breasts: The right breast is status post lumpectomy and radiation.  There is no residual erythema.  The cosmetic result is excellent.  Left breast is benign.  Both axillae are benign.}   LAB RESULTS:  CMP     Component Value Date/Time   NA 137 04/05/2020 1011   K 3.6 04/05/2020 1011   CL 102 04/05/2020 1011   CO2 26 04/05/2020 1011   GLUCOSE 103 (H) 04/05/2020 1011   BUN 9 04/05/2020 1011   CREATININE 0.71 04/05/2020 1011   CREATININE 0.78 07/27/2019 1133   CALCIUM 9.7 04/05/2020 1011   PROT 7.9 04/05/2020 1011   ALBUMIN 4.2 04/05/2020 1011   AST 73 (H) 04/05/2020 1011   AST 89 (H) 07/27/2019 1133   ALT 41 04/05/2020 1011   ALT 56 (H) 07/27/2019 1133   ALKPHOS 38 04/05/2020 1011   BILITOT 0.4 04/05/2020 1011   BILITOT 0.6 07/27/2019 1133   GFRNONAA >60 04/05/2020 1011   GFRNONAA >60 07/27/2019 1133   GFRAA >60 04/05/2020 1011   GFRAA >60 07/27/2019 1133   Lab Results  Component Value Date   WBC 3.0 (L) 04/05/2020   NEUTROABS 1.6 (L) 04/05/2020   HGB 12.9 04/05/2020   HCT 37.4 04/05/2020   MCV 99.5 04/05/2020   PLT 151 04/05/2020   No results found for: LABCA2  No components found for: QBHALP379  No results for input(s): INR in the last 168 hours.  No results found for: LABCA2  No results found for: KWI097  No results found for: DZH299  No results found for: MEQ683  No results found  for: CA2729  No components found for: HGQUANT  No results found for: CEA1 / No results found for: CEA1   No results found for: AFPTUMOR  No results found for: CHROMOGRNA  No results found for: TOTALPROTELP, ALBUMINELP, A1GS, A2GS, BETS, BETA2SER, GAMS, MSPIKE, SPEI (this displays SPEP labs)  No results found for: KPAFRELGTCHN, LAMBDASER, KAPLAMBRATIO (kappa/lambda light chains)  No results found for: HGBA, HGBA2QUANT, HGBFQUANT, HGBSQUAN (Hemoglobinopathy evaluation)   No results found for: LDH  No  results found for: IRON, TIBC, IRONPCTSAT (Iron and TIBC)  No results found for: FERRITIN  Urinalysis    Component Value Date/Time   COLORURINE YELLOW 11/08/2011 0912   APPEARANCEUR CLEAR 11/08/2011 0912   LABSPEC 1.023 11/08/2011 0912   PHURINE 5.0 11/08/2011 0912   GLUCOSEU NEGATIVE 11/08/2011 0912   GLUCOSEU NEG mg/dL 09/25/2006 2234   HGBUR NEGATIVE 11/08/2011 0912   HGBUR large 06/01/2009 1449   BILIRUBINUR small (A) 03/20/2019 1142   BILIRUBINUR neg 01/20/2018 1751   KETONESUR negative 03/20/2019 1142   KETONESUR NEGATIVE 11/08/2011 0912   PROTEINUR =30 (A) 03/20/2019 1142   PROTEINUR Positive (A) 01/20/2018 1751   PROTEINUR NEGATIVE 11/08/2011 0912   UROBILINOGEN 2.0 (A) 03/20/2019 1142   UROBILINOGEN 0.2 11/08/2011 0912   NITRITE Positive (A) 03/20/2019 1142   NITRITE neg 01/20/2018 1751   NITRITE NEGATIVE 11/08/2011 0912   LEUKOCYTESUR Large (3+) (A) 03/20/2019 1142    STUDIES:  No results found.   ELIGIBLE FOR AVAILABLE RESEARCH PROTOCOL: no   ASSESSMENT: 45 y.o. [place], [place] woman that is post right breast biopsy 09/17/2018 for a ductal carcinoma in situ, grade 3, estrogen and progesterone receptor negative  (a) biopsy of a suspicious area in the left breast 09/17/2018 was benign  (b) additional right breast biopsies obtained 10/14/2018, both from the lower outer quadrant, showed ductal carcinoma in situ, high to intermediate grade, 1 of which was  estrogen and progesterone receptor negative, the other one strongly estrogen and progesterone receptor positive  (1) genetic testing on 09/24/2018 through [facility]'s Breast Cancer STAT Panel + Common Hereditary Cancers Panel showed no pathogenic mutations. The STAT Breast cancer panel offered by [facility] includes sequencing and rearrangement analysis for the following 9 genes:  ATM, BRCA1, BRCA2, CDH1, CHEK2, PALB2, PTEN, STK11 and TP53. The Common Hereditary Cancers Panel offered by [facility] includes sequencing and/or deletion duplication testing of the following 47 genes: APC, ATM, AXIN2, BARD1, BMPR1A, BRCA1, BRCA2, BRIP1, CDH1, CDKN2A (p14ARF), CDKN2A (p16INK4a), CKD4, CHEK2, CTNNA1, DICER1, EPCAM (Deletion/duplication testing only), GREM1 (promoter region deletion/duplication testing only), KIT, MEN1, MLH1, MSH2, MSH3, MSH6, MUTYH, NBN, NF1, NHTL1, PALB2, PDGFRA, PMS2, POLD1, POLE, PTEN, RAD50, RAD51C, RAD51D, SDHB, SDHC, SDHD, SMAD4, SMARCA4. STK11, TP53, TSC1, TSC2, and VHL.  The following genes were evaluated for sequence changes only: SDHA and HOXB13 c.251G>A variant only.  (2) right lumpectomy 01/13/2019 showed ductal carcinoma in situ, grade 3, measuring 4 cm, with negative resection margins.  (3) adjuvant radiation 02/23/2019 - 04/14/2019  (a) right breast / 50 Gy in 25 fractions  (b) seroma boost / 14.4 Gy in 8 fractions  (4) started tamoxifen 05/07/2019   PLAN: [name] has completed her local treatment, namely surgery and radiation.  She tolerated them remarkably well.  She has started tamoxifen.  So far she has had no problems from it.  She had Mirena IUD inserted.  I reassured her that this is quite safe while she is on tamoxifen.  Her dad wondered about this because her cancer was at least partly progesterone receptor positive.  We reviewed the fact that the progesterone receptor is not functional unless the estrogen receptor is functioning.  She has slightly elevated liver function  tests.  They were considerably worse in March of this year, and have become better since that time.  This is likely fatty liver.  We discussed the things that can cause fatty liver including alcohol intake.  Nevertheless the numbers are actually better than they used to be.   I am going  to add a lipid panel to her next set of labs.  Accordingly the plan now is to continue tamoxifen a total of 5 years.  She will see Dr. Brantley Stage in February.  She will return to see me in August  She knows to call for any other issue that may develop before the next visit.   [name], [name], MD  11/02/20 10:49 AM Medical Oncology and Hematology [facility] [place], [place] [address] Tel. [phone]    Fax. [phone]    I, Wilburn Mylar, am acting as scribe for Dr. [name]. [name].  I, Lurline Del MD, have reviewed the above documentation for accuracy and completeness, and I agree with the above.   *Total Encounter Time as defined by the Centers for Medicare and Medicaid Services includes, in addition to the face-to-face time of a patient visit (documented in the note above) non-face-to-face time: obtaining and reviewing outside history, ordering and reviewing medications, tests or procedures, care coordination (communications with other health care professionals or caregivers) and documentation in the medical record.

## 2020-11-02 NOTE — Telephone Encounter (Signed)
R/s appts per 3/30 sch msg. Pt aware.  

## 2020-11-03 ENCOUNTER — Inpatient Hospital Stay: Payer: BC Managed Care – PPO | Admitting: Oncology

## 2020-11-03 ENCOUNTER — Inpatient Hospital Stay: Payer: BC Managed Care – PPO

## 2020-11-30 ENCOUNTER — Other Ambulatory Visit: Payer: Self-pay | Admitting: Family Medicine

## 2020-11-30 DIAGNOSIS — I10 Essential (primary) hypertension: Secondary | ICD-10-CM

## 2020-12-11 NOTE — Progress Notes (Incomplete)
[place]  [phone] Fax:([phone]) [phone]    ID: [name] DOB: June 02, 1976  MR#: [id number]  JYN#:[id number]  Patient Care Team: Carollee Herter, Alferd Apa, DO as PCP - General Susa Day, MD (Orthopedic Surgery) Mauro Kaufmann, RN as Oncology Nurse Navigator Rockwell Germany, RN as Oncology Nurse Navigator Erroll Luna, MD as Consulting Physician (General Surgery) [name], [name], MD as Consulting Physician (Oncology) Kyung Rudd, MD as Consulting Physician (Radiation Oncology) Everlene Farrier, MD as Consulting Physician (Obstetrics and Gynecology) Wylene Simmer, MD as Consulting Physician (Orthopedic Surgery) OTHER MD:    CHIEF COMPLAINT: 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   She started on tamoxifen on 05/07/2019.  She is tolerating this remarkably well, with no problems related to hot flashes or any other new symptoms.  Since her last visit, she underwent bilateral diagnostic mammography with tomography at [facility] on 09/10/2019 showing: breast density category C; no evidence of malignancy in either breast.   She presented to her PCP with palpable thickening along the lower right breast. She proceeded to right diagnostic mammography and right breast ultrasonography on 03/31/2020 showing: breast density category C; post-treatment changes without suspicious abnormalities.  She also underwent breast MRI on 05/05/2020 showing: breast composition C; no evidence of breast malignancy.   REVIEW OF SYSTEMS: [name]    COVID 19 VACCINATION STATUS: fully vaccinated [employer]), with booster 08/2020   HISTORY OF CURRENT ILLNESS: From the original intake note:  "[name]" [name] underwent bilateral diagnostic mammography with tomography at [facility] on 09/12/2018 showing: Breast Density Category C.   In the right breast, magnified views demonstrate a group of fine pleomorphic calcifications in the lower outer quadrant of the right breast spanning 2.8 x 1.7 x 5.0 cm. Calcifications are not typical for fat necrosis.   Accordingly on 09/17/2018 she proceeded to biopsy of the right breast area in question. The pathology from this procedure showed ([id number]): ductal carcinoma in situ with calcifications, high grade. Prognostic indicators significant for: estrogen receptor, 0% negative and progesterone receptor, 0% negative.   In the left breast, magnified views of the left breast calcifications demonstrate a group of faint pleomorphic calcifications in the upper-outer quadrant measuring approximately 0.8 cm in diameter. Calcifications are not typical for fat necrosis.   Accordingly, she underwent a biopsy of the right breast area in question on 09/17/2018. The pathology from this procedure showed ([id number]): fibrocystic changes with adenosis and calcifications. There is no evidence of malignancy.    The patient's subsequent history is as detailed below.   PAST MEDICAL HISTORY: Past Medical History:  Diagnosis Date  . Anemia   . Back pain   . Bimalleolar ankle fracture    left  . Breast cancer ([place])   . Family history of breast cancer   . GERD (gastroesophageal reflux disease)   . Hypertension    no meds  . Kidney infection    1998- hosp.- [facility]  . Personal history of radiation therapy     PAST SURGICAL HISTORY: Past Surgical History:  Procedure Laterality Date  . ANTERIOR LUMBAR FUSION  11/14/2011   Procedure: ANTERIOR LUMBAR FUSION 2 LEVELS;  Surgeon: Johnn Hai, MD;  Location: [facility];  Service: Orthopedics;  Laterality: N/A;  ALIF L5-S1  . BACK SURGERY    . BREAST LUMPECTOMY Right 01/2019  .  BREAST LUMPECTOMY WITH RADIOACTIVE SEED LOCALIZATION Right 01/13/2019   Procedure: RIGHT BREAST LUMPECTOMY WITH RADIOACTIVE  SEED LOCALIZATION X3;  Surgeon: Erroll Luna, MD;  Location: [facility];  Service: General;  Laterality: Right;  . CESAREAN SECTION     2007- /w epidural  anesthesia   . COLONOSCOPY    . LUMBAR LAMINECTOMY/DECOMPRESSION MICRODISCECTOMY Right 12/15/2015   Procedure:  MICO LUMBER DECOMPRESSION L4-5 ON THE RIGHT, ;  Surgeon: Susa Day, MD;  Location: [facility];  Service: Orthopedics;  Laterality: Right;  . ORIF ANKLE FRACTURE Left 10/03/2017   Procedure: OPEN REDUCTION INTERNAL FIXATION (ORIF) ANKLE BIMALLEOLAR FRACTURE; possible deltoid ligament repair;  Surgeon: Wylene Simmer, MD;  Location: [facility];  Service: Orthopedics;  Laterality: Left;  . SPINE SURGERY     fusion  . UPPER GASTROINTESTINAL ENDOSCOPY    . WISDOM TOOTH EXTRACTION     young adult    FAMILY HISTORY: Family History  Problem Relation Age of Onset  . Lupus Mother   . Breast cancer Maternal Aunt   . Anesthesia problems Neg Hx   . Colon cancer Neg Hx   . Colon polyps Neg Hx   . Esophageal cancer Neg Hx   . Rectal cancer Neg Hx   . Stomach cancer Neg Hx   [name]'s father and mother are both 11 as of 09/2018. The patient has 2 sisters. [name]'s paternal aunt was diagnosed with breast cancer at 19. Patient denies anyone in her family having breast, ovarian, prostate, or pancreatic cancer.    GYNECOLOGIC HISTORY:  No LMP recorded. (Menstrual status: IUD). 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. Her husband, [name], is a Freight forwarder for a Haematologist. Their son, [name], is 76 years old. She has a pit bull. [name]'s father, [name], is a retired Consulting civil engineer.    ADVANCED DIRECTIVES: Her husband, BJ, is automatically her healthcare power of attorney.      HEALTH MAINTENANCE: Social History   Tobacco Use  . Smoking status: Former Smoker    Packs/day: 0.25    Years: 15.00    Pack years: 3.75    Types: E-cigarettes, Cigarettes    Quit date: 04/01/2017    Years since quitting: 3.6  . Smokeless tobacco: Never Used  . Tobacco comment: vapes daily  Vaping Use  . Vaping Use: Every day  . Substances: Nicotine  Substance Use Topics  . Alcohol use: Yes    Alcohol/week: 0.0 standard drinks    Comment: a glass of wine  on occas.   . Drug use: No    Colonoscopy: yes, 2005  PAP: 09/12/2018  Bone density: no   Allergies  Allergen Reactions  . Tamiflu [Oseltamivir Phosphate] Other (See Comments)    Seizure  . Ciprofloxacin Diarrhea and Nausea And Vomiting  . Metoclopramide Hcl Other (See Comments)    "lock jaw"  . Sulfa Antibiotics Nausea And Vomiting    Current Outpatient Medications  Medication Sig Dispense Refill  . levonorgestrel (MIRENA) 20 MCG/24HR IUD 1 each by Intrauterine route once.    Marland Kitchen losartan (COZAAR) 25 MG tablet TAKE 1 TABLET (25 MG TOTAL) BY MOUTH DAILY. PT NEEDS OV FOR FURTHER REFILLS 90 tablet 1  . omeprazole (PRILOSEC) 40 MG capsule TAKE 1  CAPSULE BY MOUTH TWICE A DAY 60 capsule 11  . tamoxifen (NOLVADEX) 20 MG tablet TAKE 1 TABLET BY MOUTH EVERY DAY 90 tablet 0  . valACYclovir (VALTREX) 1000 MG tablet Take 1 tablet (1,000 mg total) by mouth 3 (three) times daily as needed. For flare ups 90 tablet 2   No current facility-administered medications for this visit.    OBJECTIVE: Young white woman in no acute distress  There were no vitals filed for this visit.   There is no height or weight on file to calculate BMI.   Wt Readings from Last 3 Encounters:  04/05/20 153 lb 11.2 oz (69.7 kg)  03/17/20 153 lb 12.8 oz (69.8 kg)  02/18/20 155 lb 12.8 oz (70.7 kg)      ECOG FS:1 - Symptomatic but completely ambulatory  Sclerae unicteric, EOMs intact Wearing a mask No cervical or supraclavicular adenopathy Lungs  no rales or rhonchi Heart regular rate and rhythm Abd soft, nontender, positive bowel sounds MSK no focal spinal tenderness, no upper extremity lymphedema Neuro: nonfocal, well oriented, appropriate affect Breasts:    {Sclerae unicteric, EOMs intact Wearing a mask No cervical or supraclavicular adenopathy Lungs no rales or rhonchi Heart regular rate and rhythm Abd soft, nontender, positive bowel sounds MSK no focal spinal tenderness, no upper extremity lymphedema Neuro: nonfocal, well oriented, appropriate affect Breasts: The right breast is status post lumpectomy and radiation.  There is no residual erythema.  The cosmetic result is excellent.  Left breast is benign.  Both axillae are benign.}   LAB RESULTS:  CMP     Component Value Date/Time   NA 137 04/05/2020 1011   K 3.6 04/05/2020 1011   CL 102 04/05/2020 1011   CO2 26 04/05/2020 1011   GLUCOSE 103 (H) 04/05/2020 1011   BUN 9 04/05/2020 1011   CREATININE 0.71 04/05/2020 1011   CREATININE 0.78 07/27/2019 1133   CALCIUM 9.7 04/05/2020 1011   PROT 7.9 04/05/2020 1011   ALBUMIN 4.2 04/05/2020 1011   AST 73 (H) 04/05/2020 1011   AST 89 (H) 07/27/2019 1133   ALT 41 04/05/2020 1011   ALT 56 (H) 07/27/2019 1133   ALKPHOS 38 04/05/2020 1011   BILITOT 0.4 04/05/2020 1011   BILITOT 0.6 07/27/2019 1133   GFRNONAA >60 04/05/2020 1011   GFRNONAA >60 07/27/2019 1133   GFRAA >60 04/05/2020 1011   GFRAA >60 07/27/2019 1133    Lab Results  Component Value Date   WBC 3.0 (L) 04/05/2020   NEUTROABS 1.6 (L) 04/05/2020   HGB 12.9 04/05/2020   HCT 37.4 04/05/2020   MCV 99.5 04/05/2020   PLT 151 04/05/2020    No results found for: LABCA2  No components found for: RXYVOP929  No results for input(s): INR in the last 168 hours.  No results found for: LABCA2  No results found for: WKM628  No results found for: MNO177  No results found for: NHA579  No results found for: CA2729  No components found for: HGQUANT  No  results found for: CEA1 / No results found for: CEA1   No results found for: AFPTUMOR  No results found for: CHROMOGRNA  No results found for: TOTALPROTELP, ALBUMINELP, A1GS, A2GS, BETS, BETA2SER, GAMS, MSPIKE, SPEI (this displays SPEP labs)  No results found for: KPAFRELGTCHN, LAMBDASER, KAPLAMBRATIO (kappa/lambda light chains)  No results found for: HGBA, HGBA2QUANT, HGBFQUANT, HGBSQUAN (Hemoglobinopathy evaluation)   No results found for: LDH  No results found for: IRON, TIBC, IRONPCTSAT (Iron and TIBC)  No  results found for: FERRITIN  Urinalysis    Component Value Date/Time   COLORURINE YELLOW 11/08/2011 0912   APPEARANCEUR CLEAR 11/08/2011 0912   LABSPEC 1.023 11/08/2011 0912   PHURINE 5.0 11/08/2011 0912   GLUCOSEU NEGATIVE 11/08/2011 0912   GLUCOSEU NEG mg/dL 09/25/2006 2234   HGBUR NEGATIVE 11/08/2011 0912   HGBUR large 06/01/2009 1449   BILIRUBINUR small (A) 03/20/2019 1142   BILIRUBINUR neg 01/20/2018 1751   KETONESUR negative 03/20/2019 1142   KETONESUR NEGATIVE 11/08/2011 0912   PROTEINUR =30 (A) 03/20/2019 1142   PROTEINUR Positive (A) 01/20/2018 1751   PROTEINUR NEGATIVE 11/08/2011 0912   UROBILINOGEN 2.0 (A) 03/20/2019 1142   UROBILINOGEN 0.2 11/08/2011 0912   NITRITE Positive (A) 03/20/2019 1142   NITRITE neg 01/20/2018 1751   NITRITE NEGATIVE 11/08/2011 0912   LEUKOCYTESUR Large (3+) (A) 03/20/2019 1142    STUDIES:  No results found.   ELIGIBLE FOR AVAILABLE RESEARCH PROTOCOL: no   ASSESSMENT: 45 y.o. [place], [place] woman that is post right breast biopsy 09/17/2018 for a ductal carcinoma in situ, grade 3, estrogen and progesterone receptor negative  (a) biopsy of a suspicious area in the left breast 09/17/2018 was benign  (b) additional right breast biopsies obtained 10/14/2018, both from the lower outer quadrant, showed ductal carcinoma in situ, high to intermediate grade, 1 of which was estrogen and progesterone receptor negative, the other  one strongly estrogen and progesterone receptor positive  (1) genetic testing on 09/24/2018 through [facility]'s Breast Cancer STAT Panel + Common Hereditary Cancers Panel showed no pathogenic mutations. The STAT Breast cancer panel offered by [facility] includes sequencing and rearrangement analysis for the following 9 genes:  ATM, BRCA1, BRCA2, CDH1, CHEK2, PALB2, PTEN, STK11 and TP53. The Common Hereditary Cancers Panel offered by [facility] includes sequencing and/or deletion duplication testing of the following 47 genes: APC, ATM, AXIN2, BARD1, BMPR1A, BRCA1, BRCA2, BRIP1, CDH1, CDKN2A (p14ARF), CDKN2A (p16INK4a), CKD4, CHEK2, CTNNA1, DICER1, EPCAM (Deletion/duplication testing only), GREM1 (promoter region deletion/duplication testing only), KIT, MEN1, MLH1, MSH2, MSH3, MSH6, MUTYH, NBN, NF1, NHTL1, PALB2, PDGFRA, PMS2, POLD1, POLE, PTEN, RAD50, RAD51C, RAD51D, SDHB, SDHC, SDHD, SMAD4, SMARCA4. STK11, TP53, TSC1, TSC2, and VHL.  The following genes were evaluated for sequence changes only: SDHA and HOXB13 c.251G>A variant only.  (2) right lumpectomy 01/13/2019 showed ductal carcinoma in situ, grade 3, measuring 4 cm, with negative resection margins.  (3) adjuvant radiation 02/23/2019 - 04/14/2019  (a) right breast / 50 Gy in 25 fractions  (b) seroma boost / 14.4 Gy in 8 fractions  (4) started tamoxifen 05/07/2019   PLAN: [name] has completed her local treatment, namely surgery and radiation.  She tolerated them remarkably well.  She has started tamoxifen.  So far she has had no problems from it.  She had Mirena IUD inserted.  I reassured her that this is quite safe while she is on tamoxifen.  Her dad wondered about this because her cancer was at least partly progesterone receptor positive.  We reviewed the fact that the progesterone receptor is not functional unless the estrogen receptor is functioning.  She has slightly elevated liver function tests.  They were considerably worse in March of this  year, and have become better since that time.  This is likely fatty liver.  We discussed the things that can cause fatty liver including alcohol intake.  Nevertheless the numbers are actually better than they used to be.   I am going to add a lipid panel to her next set of labs.  Accordingly the plan now is to continue tamoxifen a total of 5 years.  She will see Dr. Brantley Stage in February.  She will return to see me in August  She knows to call for any other issue that may develop before the next visit.   [name], [name], MD  12/11/20 11:35 PM Medical Oncology and Hematology [facility] [place], [place] [address] Tel. [phone]    Fax. [phone]    I, Wilburn Mylar, am acting as scribe for Dr. [name]. [name].  I, Lurline Del MD, have reviewed the above documentation for accuracy and completeness, and I agree with the above.   *Total Encounter Time as defined by the Centers for Medicare and Medicaid Services includes, in addition to the face-to-face time of a patient visit (documented in the note above) non-face-to-face time: obtaining and reviewing outside history, ordering and reviewing medications, tests or procedures, care coordination (communications with other health care professionals or caregivers) and documentation in the medical record.

## 2020-12-12 ENCOUNTER — Inpatient Hospital Stay: Payer: Self-pay | Admitting: Oncology

## 2020-12-12 ENCOUNTER — Inpatient Hospital Stay: Payer: Self-pay

## 2020-12-12 ENCOUNTER — Telehealth: Payer: Self-pay | Admitting: Oncology

## 2020-12-12 NOTE — Telephone Encounter (Signed)
R/s appt per 5/9 sch msg. Pt aware.  

## 2020-12-13 ENCOUNTER — Telehealth: Payer: Self-pay | Admitting: Adult Health

## 2020-12-13 NOTE — Telephone Encounter (Signed)
R/s appts per 5/10 sch msg. Pt aware.

## 2020-12-14 ENCOUNTER — Inpatient Hospital Stay: Payer: Self-pay

## 2020-12-14 ENCOUNTER — Inpatient Hospital Stay: Payer: Self-pay | Admitting: Oncology

## 2020-12-29 ENCOUNTER — Inpatient Hospital Stay: Payer: Self-pay | Attending: Oncology | Admitting: Adult Health

## 2020-12-29 ENCOUNTER — Inpatient Hospital Stay: Payer: Self-pay

## 2020-12-29 DIAGNOSIS — D0511 Intraductal carcinoma in situ of right breast: Secondary | ICD-10-CM

## 2020-12-29 DIAGNOSIS — Z5329 Procedure and treatment not carried out because of patient's decision for other reasons: Secondary | ICD-10-CM

## 2020-12-29 NOTE — Progress Notes (Signed)
Patient did not show for appointment.   

## 2021-01-14 ENCOUNTER — Other Ambulatory Visit: Payer: Self-pay | Admitting: Oncology

## 2021-04-20 ENCOUNTER — Other Ambulatory Visit: Payer: Self-pay | Admitting: Oncology

## 2021-04-20 IMAGING — [facility] MM PLC BREAST LOC DEV 1ST LESION INC*R*
8 of 9 series · 8 of 9 positions shown · non-contrast
Comparison: 10/14/2018

CLINICAL DATA: The patient presents for radioactive seed
localization of 3 sites within the RIGHT breast for 3 sites of
ductal carcinoma in situ in the LOWER OUTER QUADRANT.

EXAM:
MAMMOGRAPHIC GUIDED RADIOACTIVE SEED LOCALIZATION OF THE RIGHT
BREAST x3

[R CC (1 of 4)]
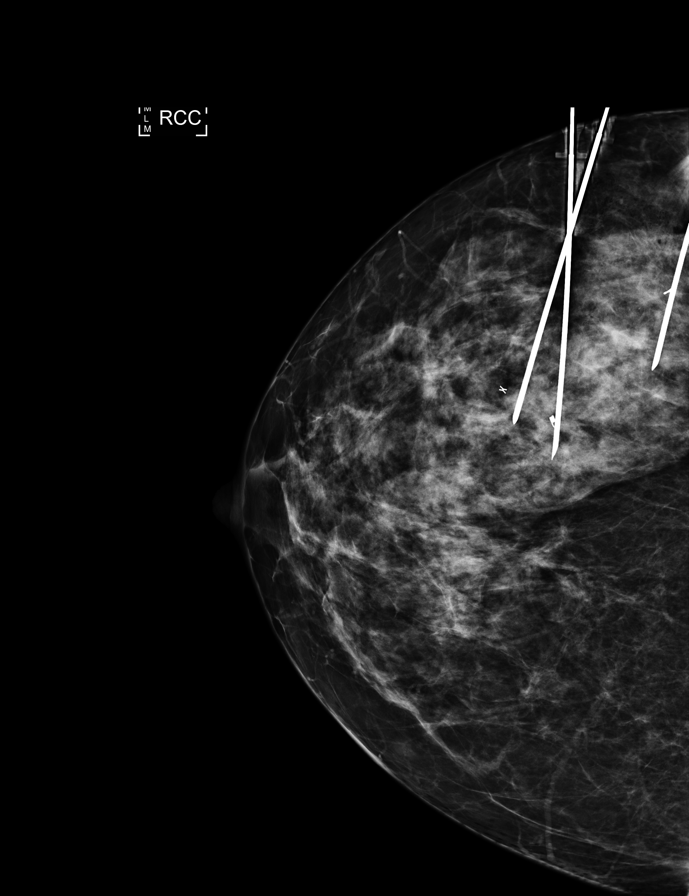

[R CC (2 of 4)]
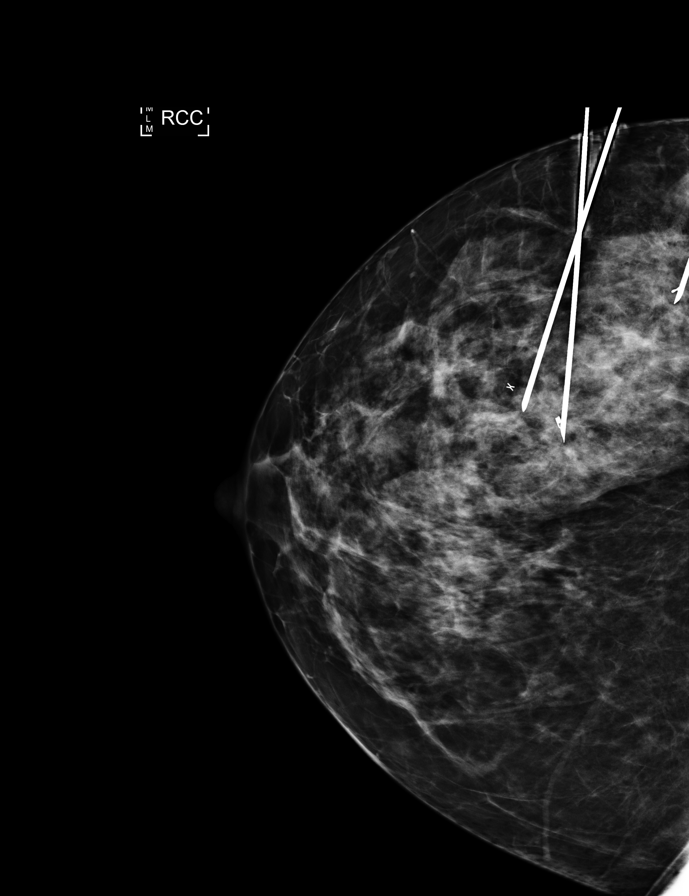

[R CC (3 of 4)]
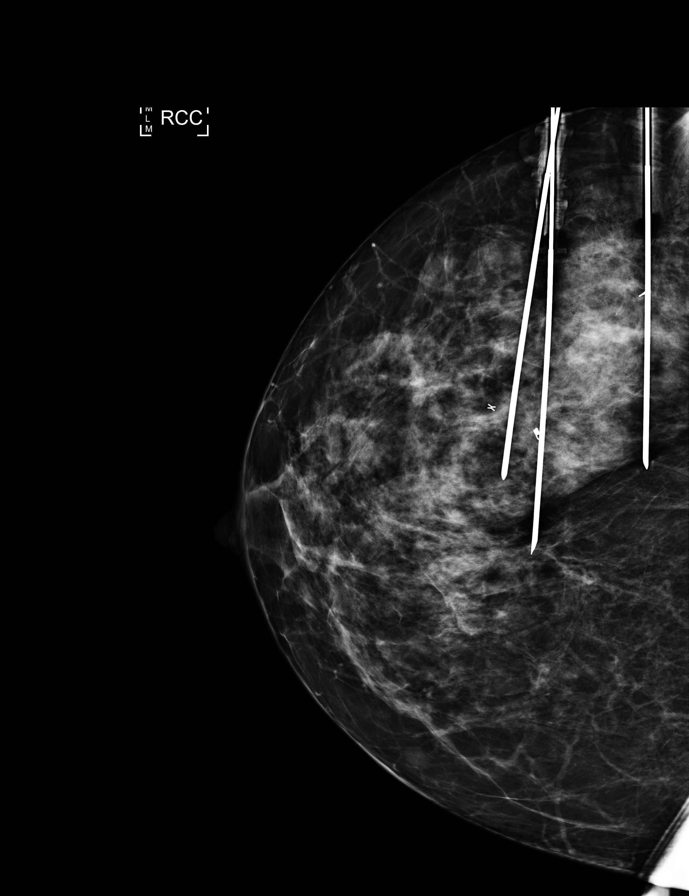

[R LM (1 of 4)]
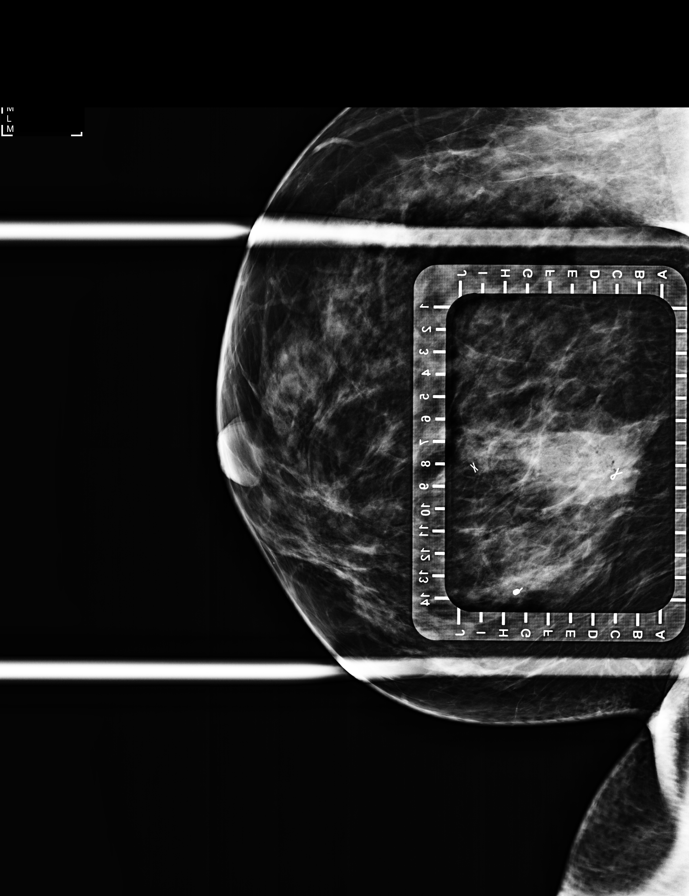

[R LM (2 of 4)]
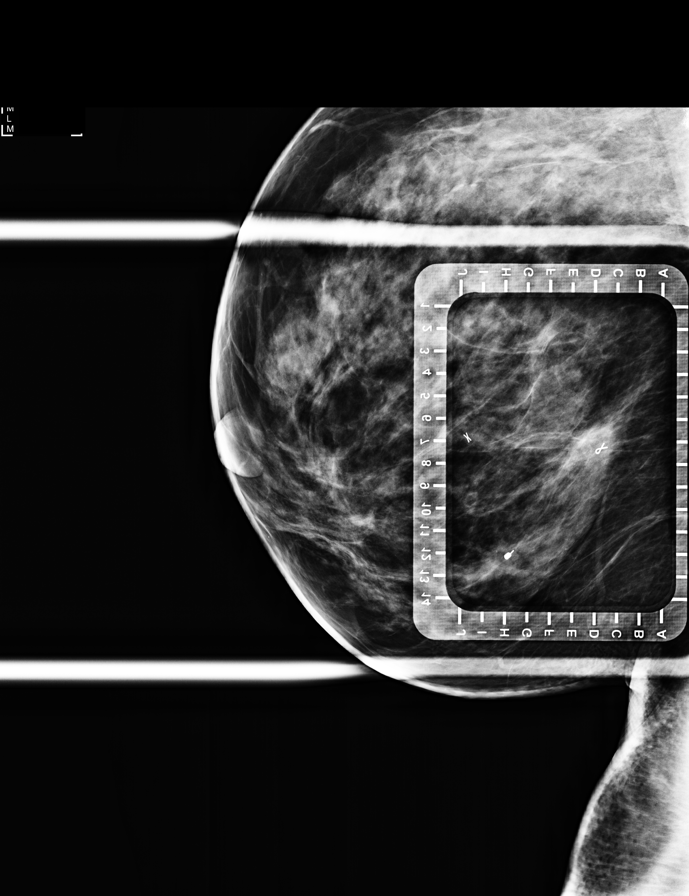

[R LM (3 of 4)]
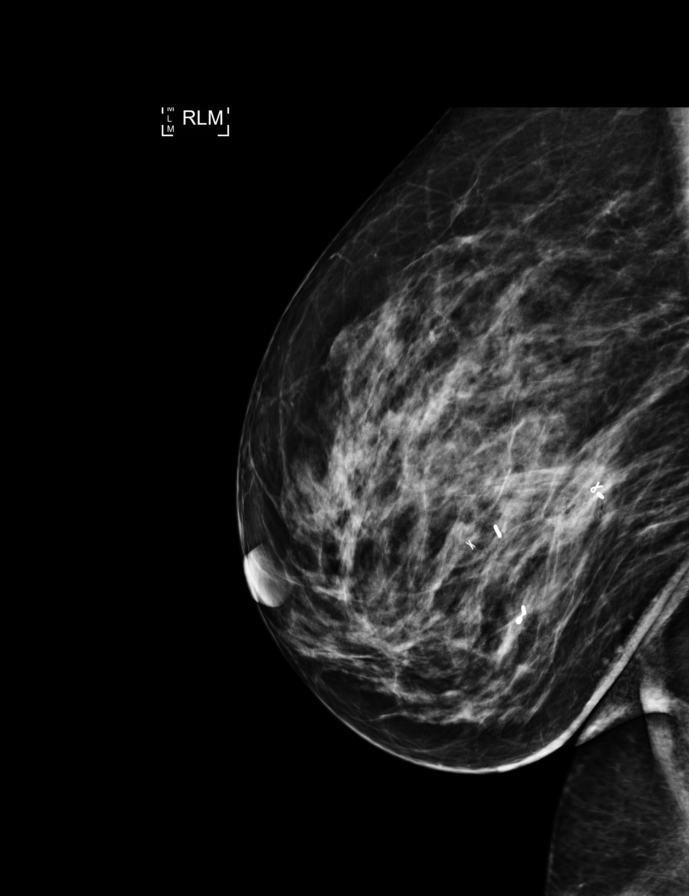

[R LM (4 of 4)]
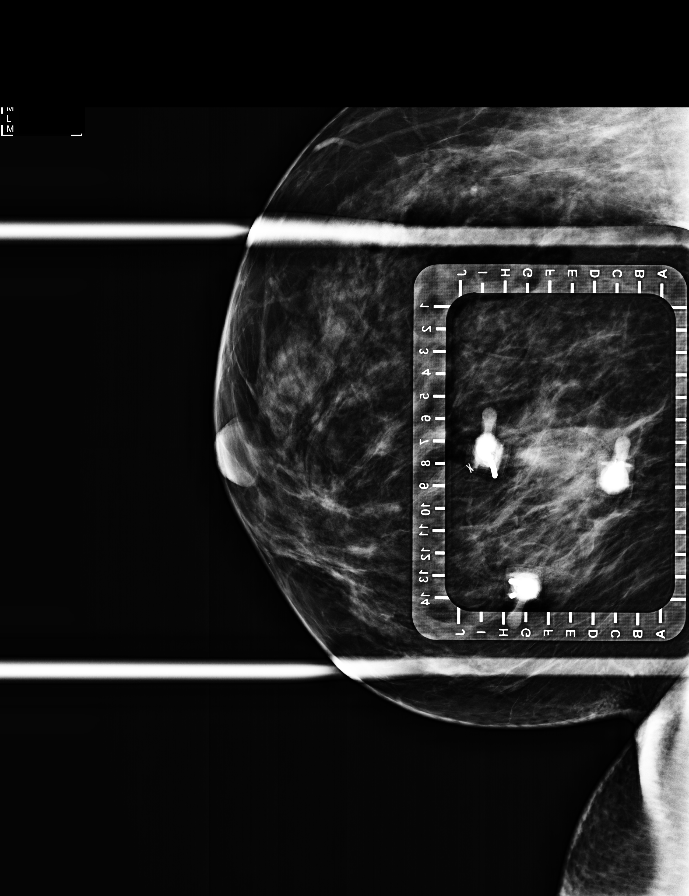

[R CC (4 of 4)]
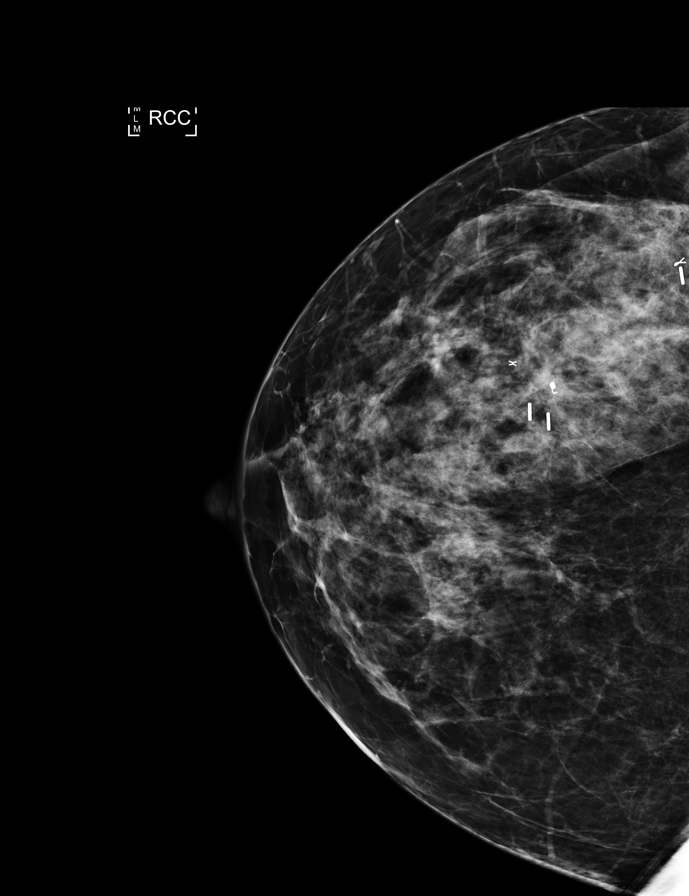

[8 of 9 positions shown; findings below may reference images not displayed]

FINDINGS: Patient presents for radioactive seed localization prior to
lumpectomy. I met with the patient and we discussed the procedure of
seed localization including benefits and alternatives. We discussed
the high likelihood of a successful procedure. We discussed the
risks of the procedure including infection, bleeding, tissue injury
and further surgery. We discussed the low dose of radioactivity
involved in the procedure. Informed, written consent was given.

The usual time-out protocol was performed immediately prior to the
procedure.

Radioactive seed 1: Using mammographic guidance, sterile technique,
1% lidocaine and an Y-KKZ radioactive seed, the X shaped clip in the
LOWER OUTER QUADRANT of the RIGHT breast was localized using a
LATERAL approach. The follow-up mammogram images confirm the seed in
the expected location and were marked for Dr. Ksworo.

Follow-up survey of the patient confirms presence of the radioactive
seed.

Order number of Y-KKZ seed:  [id number].

Total activity:  0.261 millicuries reference Date: 12/18/2018

Radioactive seed 2: Using mammographic guidance, sterile technique,
1% lidocaine and an Y-KKZ radioactive seed, the coil shaped clip in
the LOWER OUTER QUADRANT of the RIGHT breast was localized using a
LATERAL approach. The follow-up mammogram images confirm the seed in
the expected location and were marked for Dr. Lienad at.

Follow-up survey of the patient confirms presence of the radioactive
seed.

Order number of Y-KKZ seed:  [id number].

Total activity:  0.261 millicuries reference Date: 12/18/2018

Radioactive seed 3: Using mammographic guidance, sterile technique,
1% lidocaine and an Y-KKZ radioactive seed, the ribbon shaped clip
in the posterior LOWER OUTER QUADRANT of the RIGHT breast was
localized using a LATERAL approach. The follow-up mammogram images
confirm the seed in the expected location and were marked for Dr.
Garibaldi at.

Follow-up survey of the patient confirms presence of the radioactive
seed.

Order number of Y-KKZ seed:  [id number].

Total activity:  0.261 millicuries reference Date: 12/18/2018

The patient tolerated the procedure well and was released from the
[REDACTED]. She was given instructions regarding seed removal.
IMPRESSION: Radioactive seed localization right breast. No apparent
complications.

## 2021-06-07 ENCOUNTER — Other Ambulatory Visit: Payer: Self-pay | Admitting: Family Medicine

## 2021-06-07 DIAGNOSIS — I10 Essential (primary) hypertension: Secondary | ICD-10-CM

## 2021-07-08 ENCOUNTER — Other Ambulatory Visit: Payer: Self-pay | Admitting: Gastroenterology

## 2021-07-08 DIAGNOSIS — K22719 Barrett's esophagus with dysplasia, unspecified: Secondary | ICD-10-CM

## 2021-07-08 DIAGNOSIS — K219 Gastro-esophageal reflux disease without esophagitis: Secondary | ICD-10-CM

## 2021-07-25 NOTE — Progress Notes (Signed)
[facility]  Telephone:([phone] Fax:(336) [phone]    ID: [name] DOB: 1975/10/12  MR#: [id number]  JYN#:[id number]  Patient Care Team: Carollee Herter, Alferd Apa, DO as PCP - General Susa Day, MD (Orthopedic Surgery) Mauro Kaufmann, RN as Oncology Nurse Navigator Rockwell Germany, RN as Oncology Nurse Navigator Erroll Luna, MD as Consulting Physician (General Surgery) Mahogani Holohan, [name], MD as Consulting Physician (Oncology) Kyung Rudd, MD as Consulting Physician (Radiation Oncology) Everlene Farrier, MD as Consulting Physician (Obstetrics and Gynecology) Wylene Simmer, MD as Consulting Physician (Orthopedic Surgery) OTHER MD:    CHIEF COMPLAINT: 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 I last saw her two years ago today.  She started on tamoxifen on 05/07/2019.  She tolerates this generally well.  She has some hot flashes but these are improved.  She has very little sex drive.  She has a little vaginal wetness which does not help with the vaginal dryness problem.  She is behind on her mammography, the last mammogram she had being August 2021.  She did have an MRI of the breast in October 2021 for evaluation of what seemed to her and probably was new scar in the inferior aspect of the surgical breast.  That was benign.   REVIEW OF SYSTEMS: [name] continues to work full-time.  She belongs to a gym, but she does not use it.  She walks her dog about 15 minutes most days.  She has no problems sleeping.  Quite aside from the loss of libido there is some dyspareunia.  A detailed review of systems today was otherwise stable   COVID 19 VACCINATION STATUS: Fully vaccinated, status post 2 boosters as of December 2022   HISTORY OF CURRENT ILLNESS: From the original intake note:  "[name]" [name] underwent bilateral diagnostic mammography with tomography at [facility] on 09/12/2018 showing: Breast Density Category C.   In the right breast, magnified views demonstrate a group of fine pleomorphic calcifications in the lower outer quadrant of the right breast spanning 2.8 x 1.7 x 5.0 cm. Calcifications are not typical for fat necrosis.   Accordingly on 09/17/2018 she proceeded to biopsy of the right breast area in question. The pathology from this procedure showed ([id number]): ductal carcinoma in situ with calcifications, high grade. Prognostic indicators significant for: estrogen receptor, 0% negative and progesterone receptor, 0% negative.   In the left breast, magnified views of the left breast calcifications demonstrate a group of faint pleomorphic calcifications in the upper-outer quadrant measuring approximately 0.8 cm in diameter. Calcifications are not typical for fat necrosis.   Accordingly, she underwent a biopsy of the right breast area in question on 09/17/2018. The pathology from this procedure showed ([id number]): fibrocystic changes with adenosis and calcifications. There is no evidence of malignancy.    The patient's subsequent history is as detailed below.   PAST MEDICAL HISTORY: Past Medical History:  Diagnosis Date   Anemia    Back pain    Bimalleolar ankle fracture    left   Breast cancer (HCC)    Family history of breast cancer    GERD (gastroesophageal reflux disease)    Hypertension    no meds   Kidney infection    1998- hosp[facility]   Personal history of radiation therapy     PAST SURGICAL HISTORY: Past Surgical History:  Procedure Laterality Date   ANTERIOR LUMBAR FUSION  11/14/2011   Procedure: ANTERIOR LUMBAR FUSION 2 LEVELS;  Surgeon: Johnn Hai, MD;  Location: [facility];  Service: Orthopedics;  Laterality: N/A;  ALIF L5-S1   BACK SURGERY     BREAST LUMPECTOMY Right 01/2019   BREAST LUMPECTOMY WITH RADIOACTIVE SEED  LOCALIZATION Right 01/13/2019   Procedure: RIGHT BREAST LUMPECTOMY WITH RADIOACTIVE SEED LOCALIZATION X3;  Surgeon: Erroll Luna, MD;  Location: [facility];  Service: General;  Laterality: Right;   CESAREAN SECTION     2007- /w epidural  anesthesia    COLONOSCOPY     LUMBAR LAMINECTOMY/DECOMPRESSION MICRODISCECTOMY Right 12/15/2015   Procedure:  MICO LUMBER DECOMPRESSION L4-5 ON THE RIGHT, ;  Surgeon: Susa Day, MD;  [facility];  Service: Orthopedics;  Laterality: Right;   ORIF ANKLE FRACTURE Left 10/03/2017   Procedure: OPEN REDUCTION INTERNAL FIXATION (ORIF) ANKLE BIMALLEOLAR FRACTURE; possible deltoid ligament repair;  Surgeon: Wylene Simmer, MD;  Location: [facility];  Service: Orthopedics;  Laterality: Left;   SPINE SURGERY     fusion   UPPER GASTROINTESTINAL ENDOSCOPY     WISDOM TOOTH EXTRACTION     young adult    FAMILY HISTORY: Family History  Problem Relation Age of Onset   Lupus Mother    Breast cancer Maternal Aunt    Anesthesia problems Neg Hx    Colon cancer Neg Hx    Colon polyps Neg Hx    Esophageal cancer Neg Hx    Rectal cancer Neg Hx    Stomach cancer Neg Hx   [name] father and mother are both 35 as of 09/2018. The patient has 2 sisters. [name] paternal aunt was diagnosed with breast cancer at 40. Patient denies anyone in her family having breast, ovarian, prostate, or pancreatic cancer.    GYNECOLOGIC HISTORY:  No LMP recorded. (Menstrual status: IUD). 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 Their son, [name], is 73 years old as of December 2022. [name] father, [name], is a retired Consulting civil engineer.    ADVANCED DIRECTIVES: Her husband, BJ, is automatically  her healthcare power of attorney.     HEALTH MAINTENANCE: Social History   Tobacco Use   Smoking status: Former    Packs/day: 0.25    Years: 15.00    Pack years: 3.75    Types: E-cigarettes, Cigarettes    Quit date: 04/01/2017    Years since quitting: 4.3   Smokeless tobacco: Never   Tobacco comments:    vapes daily  Vaping Use   Vaping Use: Every day   Substances: Nicotine  Substance Use Topics   Alcohol use: Yes    Alcohol/week: 0.0 standard drinks    Comment: a glass of wine  on occas.    Drug use: No    Colonoscopy: yes, 2005  PAP: 09/12/2018  Bone density: no   Allergies  Allergen Reactions   Tamiflu [Oseltamivir Phosphate] Other (See Comments)    Seizure   Ciprofloxacin Diarrhea and Nausea And Vomiting   Metoclopramide Hcl Other (See Comments)    "lock jaw"   Sulfa Antibiotics Nausea And Vomiting    Current Outpatient Medications  Medication Sig Dispense Refill  levonorgestrel (MIRENA) 20 MCG/24HR IUD 1 each by Intrauterine route once.     losartan (COZAAR) 25 MG tablet TAKE 1 TABLET BY MOUTH EVERY DAY 90 tablet 1   omeprazole (PRILOSEC) 40 MG capsule TAKE 1 CAPSULE BY MOUTH TWICE A DAY 60 capsule 11   tamoxifen (NOLVADEX) 20 MG tablet TAKE 1 TABLET BY MOUTH EVERY DAY 90 tablet 0   valACYclovir (VALTREX) 1000 MG tablet Take 1 tablet (1,000 mg total) by mouth 3 (three) times daily as needed. For flare ups 90 tablet 2   No current facility-administered medications for this visit.    OBJECTIVE: white woman in no acute distress  Vitals:   07/26/21 1504  BP: 128/77  Pulse: (!) 112  Resp: 18  Temp: 97.7 F (36.5 C)  SpO2: 96%      Body mass index is 26.84 kg/m.   Wt Readings from Last 3 Encounters:  07/26/21 151 lb 8 oz (68.7 kg)  04/05/20 153 lb 11.2 oz (69.7 kg)  03/17/20 153 lb 12.8 oz (69.8 kg)     ECOG FS:1 - Symptomatic but completely ambulatory  Sclerae unicteric, EOMs intact Wearing a mask No cervical or supraclavicular  adenopathy Lungs no rales or rhonchi Heart regular rate and rhythm Abd soft, nontender, positive bowel sounds MSK no focal spinal tenderness, no upper extremity lymphedema Neuro: nonfocal, well oriented, appropriate affect Breasts: Right breast is status postlumpectomy and radiation.  There is the expected induration inferiorly.  There is no evidence of local recurrence.  Left breast and both axillae are benign.   LAB RESULTS:  CMP     Component Value Date/Time   NA 137 04/05/2020 1011   K 3.6 04/05/2020 1011   CL 102 04/05/2020 1011   CO2 26 04/05/2020 1011   GLUCOSE 103 (H) 04/05/2020 1011   BUN 9 04/05/2020 1011   CREATININE 0.71 04/05/2020 1011   CREATININE 0.78 07/27/2019 1133   CALCIUM 9.7 04/05/2020 1011   PROT 7.9 04/05/2020 1011   ALBUMIN 4.2 04/05/2020 1011   AST 73 (H) 04/05/2020 1011   AST 89 (H) 07/27/2019 1133   ALT 41 04/05/2020 1011   ALT 56 (H) 07/27/2019 1133   ALKPHOS 38 04/05/2020 1011   BILITOT 0.4 04/05/2020 1011   BILITOT 0.6 07/27/2019 1133   GFRNONAA >60 04/05/2020 1011   GFRNONAA >60 07/27/2019 1133   GFRAA >60 04/05/2020 1011   GFRAA >60 07/27/2019 1133    No results found for: TOTALPROTELP, ALBUMINELP, A1GS, A2GS, BETS, BETA2SER, GAMS, MSPIKE, SPEI  No results found for: KPAFRELGTCHN, LAMBDASER, KAPLAMBRATIO  Lab Results  Component Value Date   WBC 3.0 (L) 04/05/2020   NEUTROABS 1.6 (L) 04/05/2020   HGB 12.9 04/05/2020   HCT 37.4 04/05/2020   MCV 99.5 04/05/2020   PLT 151 04/05/2020   No results found for: LABCA2  No components found for: [id number]  No results for input(s): INR in the last 168 hours.  No results found for: LABCA2  No results found for: NWG956  No results found for: OZH086  No results found for: VHQ469  No results found for: CA2729  No components found for: HGQUANT  No results found for: CEA1 / No results found for: CEA1   No results found for: AFPTUMOR  No results found for: CHROMOGRNA  No results  found for: TOTALPROTELP, ALBUMINELP, A1GS, A2GS, BETS, BETA2SER, GAMS, MSPIKE, SPEI (this displays SPEP labs)  No results found for: KPAFRELGTCHN, LAMBDASER, KAPLAMBRATIO (kappa/lambda light chains)  No results found for: HGBA, HGBA2QUANT,  HGBFQUANT, HGBSQUAN (Hemoglobinopathy evaluation)   No results found for: LDH  No results found for: IRON, TIBC, IRONPCTSAT (Iron and TIBC)  No results found for: FERRITIN  Urinalysis    Component Value Date/Time   COLORURINE YELLOW 11/08/2011 0912   APPEARANCEUR CLEAR 11/08/2011 0912   LABSPEC 1.023 11/08/2011 0912   PHURINE 5.0 11/08/2011 0912   GLUCOSEU NEGATIVE 11/08/2011 0912   GLUCOSEU NEG mg/dL 09/25/2006 2234   HGBUR NEGATIVE 11/08/2011 0912   HGBUR large 06/01/2009 1449   BILIRUBINUR small (A) 03/20/2019 1142   BILIRUBINUR neg 01/20/2018 1751   KETONESUR negative 03/20/2019 1142   KETONESUR NEGATIVE 11/08/2011 0912   PROTEINUR =30 (A) 03/20/2019 1142   PROTEINUR Positive (A) 01/20/2018 1751   PROTEINUR NEGATIVE 11/08/2011 0912   UROBILINOGEN 2.0 (A) 03/20/2019 1142   UROBILINOGEN 0.2 11/08/2011 0912   NITRITE Positive (A) 03/20/2019 1142   NITRITE neg 01/20/2018 1751   NITRITE NEGATIVE 11/08/2011 0912   LEUKOCYTESUR Large (3+) (A) 03/20/2019 1142    STUDIES:  No results found.   ELIGIBLE FOR AVAILABLE RESEARCH PROTOCOL: no   ASSESSMENT: 45 y.o. [name], [place] woman that is post right breast biopsy 09/17/2018 for a ductal carcinoma in situ, grade 3, estrogen and progesterone receptor negative  (a) biopsy of a suspicious area in the left breast 09/17/2018 was benign  (b) additional right breast biopsies obtained 10/14/2018, both from the lower outer quadrant, showed ductal carcinoma in situ, high to intermediate grade, 1 of which was estrogen and progesterone receptor negative, the other one strongly estrogen and progesterone receptor positive  (1) genetic testing on 09/24/2018 through [facility]'s Breast Cancer STAT Panel +  Common Hereditary Cancers Panel showed no pathogenic mutations. The STAT Breast cancer panel offered by [facility] includes sequencing and rearrangement analysis for the following 9 genes:  ATM, BRCA1, BRCA2, CDH1, CHEK2, PALB2, PTEN, STK11 and TP53. The Common Hereditary Cancers Panel offered by [facility] includes sequencing and/or deletion duplication testing of the following 47 genes: APC, ATM, AXIN2, BARD1, BMPR1A, BRCA1, BRCA2, BRIP1, CDH1, CDKN2A (p14ARF), CDKN2A (p16INK4a), CKD4, CHEK2, CTNNA1, DICER1, EPCAM (Deletion/duplication testing only), GREM1 (promoter region deletion/duplication testing only), KIT, MEN1, MLH1, MSH2, MSH3, MSH6, MUTYH, NBN, NF1, NHTL1, PALB2, PDGFRA, PMS2, POLD1, POLE, PTEN, RAD50, RAD51C, RAD51D, SDHB, SDHC, SDHD, SMAD4, SMARCA4. STK11, TP53, TSC1, TSC2, and VHL.  The following genes were evaluated for sequence changes only: SDHA and HOXB13 c.251G>A variant only.  (2) right lumpectomy 01/13/2019 showed ductal carcinoma in situ, grade 3, measuring 4 cm, with negative resection margins.  (3) adjuvant radiation 02/23/2019 - 04/14/2019  (a) right breast / 50 Gy in 25 fractions  (b) seroma boost / 14.4 Gy in 8 fractions  (4) started tamoxifen 05/07/2019   PLAN: [name] is now 2-1/2 years out from definitive surgery for her breast cancer with no evidence of disease recurrence.  This is very favorable.  She is tolerating tamoxifen generally well and the plan is to continue that a minimum of 5 years.  She does report some loss of libido and dyspareunia.  As far as the dyspareunia is concerned I am comfortable trying some Estrace cream vaginally, initially 3 times a week and once those tissues are little bit healthier twice a week.  She will also need dilation and I am referring her to physical therapy with that in mind.  A second component of libido however is psychological and she will feel much better if she feels better and more attractive looking.  The way to do that is to  go to  the gym.  Fortunately she has gym [employer] and there is no reason she could not go there 3 or even 4 times a week to achieve what ever goals she wishes to set for herself.  As far as breast cancer is concerned she is behind on her mammography and I have entered those orders.  She will follow-up with [place] in March 2023 and likely yearly thereafter  Total encounter time 25 minutes.*    Mylynn Dinh, [name], MD  07/26/21 3:07 PM Medical Oncology and Hematology [facility] [place], [place] [address] Tel. [phone]    Fax. [phone]    I, Wilburn Mylar, am acting as scribe for Dr. [name]. Narda Fundora.  I, Lurline Del MD, have reviewed the above documentation for accuracy and completeness, and I agree with the above.   *Total Encounter Time as defined by the Centers for Medicare and Medicaid Services includes, in addition to the face-to-face time of a patient visit (documented in the note above) non-face-to-face time: obtaining and reviewing outside history, ordering and reviewing medications, tests or procedures, care coordination (communications with other health care professionals or caregivers) and documentation in the medical record.

## 2021-07-26 ENCOUNTER — Other Ambulatory Visit: Payer: Self-pay

## 2021-07-26 ENCOUNTER — Inpatient Hospital Stay: Payer: 59 | Attending: Oncology | Admitting: Oncology

## 2021-07-26 VITALS — BP 128/77 | HR 112 | Temp 97.7°F | Resp 18 | Ht 63.0 in | Wt 151.5 lb

## 2021-07-26 DIAGNOSIS — Z170 Estrogen receptor positive status [ER+]: Secondary | ICD-10-CM | POA: Insufficient documentation

## 2021-07-26 DIAGNOSIS — C50512 Malignant neoplasm of lower-outer quadrant of left female breast: Secondary | ICD-10-CM | POA: Diagnosis not present

## 2021-07-26 DIAGNOSIS — Z79810 Long term (current) use of selective estrogen receptor modulators (SERMs): Secondary | ICD-10-CM | POA: Diagnosis not present

## 2021-07-26 DIAGNOSIS — D0511 Intraductal carcinoma in situ of right breast: Secondary | ICD-10-CM | POA: Diagnosis not present

## 2021-07-26 MED ORDER — TAMOXIFEN CITRATE 20 MG PO TABS
20.0000 mg | ORAL_TABLET | Freq: Every day | ORAL | 4 refills | Status: DC
Start: 2021-07-26 — End: 2022-12-10

## 2021-07-26 MED ORDER — ESTRADIOL 0.1 MG/GM VA CREA: 1.0000 | TOPICAL_CREAM | Freq: Every day | VAGINAL | 12 refills | Status: DC

## 2021-08-03 ENCOUNTER — Telehealth: Payer: Self-pay | Admitting: Oncology

## 2021-08-03 NOTE — Telephone Encounter (Signed)
Scheduled appointment per 12/21 los. Patient did not want to discuss appointment at this time; therefore, I informed the patient that an updated calendar can be mailed.

## 2021-08-24 ENCOUNTER — Encounter: Payer: 59 | Attending: Oncology | Admitting: Physical Therapy

## 2021-08-31 ENCOUNTER — Encounter: Payer: 59 | Admitting: Physical Therapy

## 2021-09-07 ENCOUNTER — Encounter: Payer: 59 | Admitting: Physical Therapy

## 2021-09-12 ENCOUNTER — Encounter: Payer: 59 | Admitting: Physical Therapy

## 2021-10-03 ENCOUNTER — Telehealth: Payer: Self-pay | Admitting: Family Medicine

## 2021-10-03 NOTE — Telephone Encounter (Signed)
Pt would like to know which endocrinologist she seen in the past  Please advise

## 2021-10-04 ENCOUNTER — Encounter: Payer: Self-pay | Admitting: Gastroenterology

## 2021-10-04 NOTE — Telephone Encounter (Signed)
I reviewed patient's chart and I didn't see Endo. Could you look behind me to see if I missed it? ?

## 2021-10-04 NOTE — Telephone Encounter (Signed)
Spoke with patient. She has was actually looking for the GI doctor who did her Endoscopy. Nandigam, Kavitha V. Pt provided phone number ?

## 2021-10-23 ENCOUNTER — Other Ambulatory Visit: Payer: Self-pay

## 2021-10-23 ENCOUNTER — Encounter: Payer: Self-pay | Admitting: Hematology and Oncology

## 2021-10-23 ENCOUNTER — Inpatient Hospital Stay: Payer: 59 | Attending: Oncology | Admitting: Hematology and Oncology

## 2021-10-23 VITALS — BP 137/92 | HR 92 | Temp 97.9°F | Resp 18 | Ht 63.0 in | Wt 148.5 lb

## 2021-10-23 DIAGNOSIS — Z79810 Long term (current) use of selective estrogen receptor modulators (SERMs): Secondary | ICD-10-CM | POA: Insufficient documentation

## 2021-10-23 DIAGNOSIS — Z923 Personal history of irradiation: Secondary | ICD-10-CM | POA: Insufficient documentation

## 2021-10-23 DIAGNOSIS — C50512 Malignant neoplasm of lower-outer quadrant of left female breast: Secondary | ICD-10-CM | POA: Insufficient documentation

## 2021-10-23 DIAGNOSIS — Z170 Estrogen receptor positive status [ER+]: Secondary | ICD-10-CM | POA: Diagnosis not present

## 2021-10-23 DIAGNOSIS — D0511 Intraductal carcinoma in situ of right breast: Secondary | ICD-10-CM | POA: Diagnosis not present

## 2021-10-23 DIAGNOSIS — Z79899 Other long term (current) drug therapy: Secondary | ICD-10-CM | POA: Insufficient documentation

## 2021-10-23 NOTE — Progress Notes (Signed)
?[name]  ?Telephone:(336) [phone] [phone]  ? ? ?ID: [name] DOB: 24-Mar-1976  MR#: [id number]  OEU#:[id number] ? ?Patient Care Team: ?Carollee Herter, Alferd Apa, DO as PCP - General ?Susa Day, MD (Orthopedic Surgery) ?Mauro Kaufmann, RN as Oncology Nurse Navigator ?Rockwell Germany, RN as Oncology Nurse Navigator ?Erroll Luna, MD as Consulting Physician (General Surgery) ?[name], [name], MD (Inactive) as Consulting Physician (Oncology) ?Kyung Rudd, MD as Consulting Physician (Radiation Oncology) ?Everlene Farrier, MD as Consulting Physician (Obstetrics and Gynecology) ?Wylene Simmer, MD as Consulting Physician (Orthopedic Surgery) ?OTHER MD:  ? ? ?CHIEF COMPLAINT: Ductal carcinoma in situ right breast one specimen was ER positive and the second 1 was ER negative ? ?CURRENT TREATMENT: tamoxifen ? ?INTERVAL HISTORY: ? ?[name] returns today for 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 ?She started on tamoxifen on 05/07/2019.  She tolerates this generally well.  She denies any new complaints since her last visit she has not had a mammogram since August 2021. ?She complains of some increased density near her scar in the right breast and wanted to come for a visit regarding this.  She denies any palpable masses ? ?REVIEW OF SYSTEMS: Rest of the pertinent 10 point ROS reviewed and negative ? ?HISTORY OF CURRENT ILLNESS: ?From the original intake note: ? ?"Diana" [name] underwent bilateral diagnostic mammography with tomography at [facility] on 09/12/2018 showing: Breast Density Category C.  ? ?In the right breast, magnified views demonstrate a group of fine pleomorphic calcifications in the lower outer quadrant of the right breast spanning 2.8 x 1.7 x 5.0 cm. Calcifications are not typical for fat necrosis.  ? ?Accordingly on 09/17/2018 she proceeded to biopsy of the right breast area in question. The pathology from this procedure showed  ([id number]): ductal carcinoma in situ with calcifications, high grade. Prognostic indicators significant for: estrogen receptor, 0% negative and progesterone receptor, 0% negative.  ? ?In the left breast, magnified views of the left breast calcifications demonstrate a group of faint pleomorphic calcifications in the upper-outer quadrant measuring approximately 0.8 cm in diameter. Calcifications are not typical for fat necrosis.  ? ?Accordingly, she underwent a biopsy of the right breast area in question on 09/17/2018. The pathology from this procedure showed ([id number]): fibrocystic changes with adenosis and calcifications. There is no evidence of malignancy.   ? ?The patient's subsequent history is as detailed below. ? ? ?PAST MEDICAL HISTORY: ?Past Medical History:  ?Diagnosis Date  ? Anemia   ? Back pain   ? Bimalleolar ankle fracture   ? left  ? Breast cancer ([place])   ? Family history of breast cancer   ? GERD (gastroesophageal reflux disease)   ? Hypertension   ? no meds  ? Kidney infection   ? [place]  ? Personal history of radiation therapy   ? ? ?PAST SURGICAL HISTORY: ?Past Surgical History:  ?Procedure Laterality Date  ? ANTERIOR LUMBAR FUSION  11/14/2011  ? Procedure: ANTERIOR LUMBAR FUSION 2 LEVELS;  Surgeon: Johnn Hai, MD;  Location: [facility];  Service: Orthopedics;  Laterality: N/A;  ALIF L5-S1  ? BACK SURGERY    ? BREAST LUMPECTOMY Right 01/2019  ? BREAST LUMPECTOMY WITH RADIOACTIVE SEED LOCALIZATION Right 01/13/2019  ? Procedure: RIGHT BREAST LUMPECTOMY WITH RADIOACTIVE SEED LOCALIZATION X3;  Surgeon: Erroll Luna, MD;  Location: [facility];  Service: General;  Laterality: Right;  ? CESAREAN SECTION    ? 2007- /w epidural  anesthesia   ?  COLONOSCOPY    ? LUMBAR LAMINECTOMY/DECOMPRESSION MICRODISCECTOMY Right 12/15/2015  ? Procedure:  MICO LUMBER DECOMPRESSION L4-5 ON THE RIGHT, ;  Surgeon: Susa Day, MD;  [facility];  Service: Orthopedics;  Laterality: Right;  ?  ORIF ANKLE FRACTURE Left 10/03/2017  ? Procedure: OPEN REDUCTION INTERNAL FIXATION (ORIF) ANKLE BIMALLEOLAR FRACTURE; possible deltoid ligament repair;  Surgeon: Wylene Simmer, MD;  Location: [facility];  Service: Orthopedics;  Laterality: Left;  ? SPINE SURGERY    ? fusion  ? UPPER GASTROINTESTINAL ENDOSCOPY    ? WISDOM TOOTH EXTRACTION    ? young adult  ? ? ?FAMILY HISTORY: ?Family History  ?Problem Relation Age of Onset  ? Lupus Mother   ? Breast cancer Maternal Aunt   ? Anesthesia problems Neg Hx   ? Colon cancer Neg Hx   ? Colon polyps Neg Hx   ? Esophageal cancer Neg Hx   ? Rectal cancer Neg Hx   ? Stomach cancer Neg Hx   ?Diana father and mother are both 68 as of 09/2018. The patient has 2 sisters. Diana paternal aunt was diagnosed with breast cancer at 61. Patient denies anyone in her family having breast, ovarian, prostate, or pancreatic cancer.  ? ? ?GYNECOLOGIC HISTORY:  ?No LMP recorded. (Menstrual status: IUD). ?Menarche: 46 years old ?Age at first live birth: 46 years old ?GX P: 1 ?LMP: 5+ years ago ?Contraceptive: Mirena in place ?HRT: yes, 5 years  ?Hysterectomy?: no ?BSO?: no ? ? 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 Their son, [name], is 60 years old as of December 2022. Diana father, [name], is a retired Consulting civil engineer.  ? ? ADVANCED DIRECTIVES: Her husband, BJ, is automatically her healthcare power of attorney.   ? ? ?HEALTH MAINTENANCE: ?Social History  ? ?Tobacco Use  ? Smoking status: Former  ?  Packs/day: 0.25  ?  Years: 15.00  ?  Pack years: 3.75  ?  Types: E-cigarettes, Cigarettes  ?  Quit date: 04/01/2017  ?  Years since quitting: 4.5  ? Smokeless tobacco: Never  ? Tobacco comments:  ?  vapes daily  ?Vaping Use  ? Vaping Use: Every day  ? Substances: Nicotine  ?Substance Use Topics  ? Alcohol use: Yes  ?  Alcohol/week: 0.0 standard drinks  ?  Comment: a glass of wine  on  occas.   ? Drug use: No  ? ? Colonoscopy: yes, 2005 ? PAP: 09/12/2018 ? Bone density: no ?  ?Allergies  ?Allergen Reactions  ? Tamiflu [Oseltamivir Phosphate] Other (See Comments)  ?  Seizure  ? Ciprofloxacin Diarrhea and Nausea And Vomiting  ? Metoclopramide Hcl Other (See Comments)  ?  "lock jaw"  ? Sulfa Antibiotics Nausea And Vomiting  ? ? ?Current Outpatient Medications  ?Medication Sig Dispense Refill  ? estradiol (ESTRACE VAGINAL) 0.1 MG/GM vaginal cream Place 1 Applicatorful vaginally at bedtime. 42.5 g 12  ? levonorgestrel (MIRENA) 20 MCG/24HR IUD 1 each by Intrauterine route once.    ? losartan (COZAAR) 25 MG tablet TAKE 1 TABLET BY MOUTH EVERY DAY 90 tablet 1  ? omeprazole (PRILOSEC) 40 MG capsule TAKE 1 CAPSULE BY MOUTH TWICE A DAY 60 capsule 11  ? tamoxifen (NOLVADEX) 20 MG tablet Take 1 tablet (20 mg total) by mouth daily. 90 tablet 4  ? valACYclovir (VALTREX) 1000 MG tablet Take 1 tablet (1,000 mg total) by mouth 3 (three) times  daily as needed. For flare ups 90 tablet 2  ? ?No current facility-administered medications for this visit.  ? ? ?OBJECTIVE: white woman in no acute distress ? ?Vitals:  ? 10/23/21 1159  ?BP: (!) 137/92  ?Pulse: 92  ?Resp: 18  ?Temp: 97.9 ?F (36.6 ?C)  ?SpO2: 100%  ? ?   Body mass index is 26.31 kg/m?.   ?Wt Readings from Last 3 Encounters:  ?10/23/21 148 lb 8 oz (67.4 kg)  ?07/26/21 151 lb 8 oz (68.7 kg)  ?04/05/20 153 lb 11.2 oz (69.7 kg)  ? ?  ECOG FS:1 - Symptomatic but completely ambulatory ? ?Physical Exam ?Constitutional:   ?   Appearance: Normal appearance.  ?Cardiovascular:  ?   Rate and Rhythm: Normal rate and regular rhythm.  ?   Pulses: Normal pulses.  ?   Heart sounds: Normal heart sounds.  ?Pulmonary:  ?   Effort: Pulmonary effort is normal.  ?   Breath sounds: Normal breath sounds.  ?Chest:  ?   Comments: Bilateral breasts inspected.  No palpable nodules or regional adenopathy. ?The area that patient was concerned about just is likely scar tissue in the  inframammary fold. ?Abdominal:  ?   General: Abdomen is flat. Bowel sounds are normal.  ?   Palpations: Abdomen is soft.  ?Musculoskeletal:     ?   General: Normal range of motion.  ?   Cervical back: Normal range of

## 2021-11-08 ENCOUNTER — Other Ambulatory Visit: Payer: Self-pay

## 2021-11-08 ENCOUNTER — Ambulatory Visit (AMBULATORY_SURGERY_CENTER): Payer: Self-pay | Admitting: *Deleted

## 2021-11-08 VITALS — Ht 63.0 in | Wt 145.0 lb

## 2021-11-08 DIAGNOSIS — K2270 Barrett's esophagus without dysplasia: Secondary | ICD-10-CM

## 2021-11-08 NOTE — Progress Notes (Signed)
?  No trouble with anesthesia, denies being told they were difficult to intubate, or hx/fam hx of malignant hyperthermia per pt ? ? ?No egg or soy allergy ? ?No home oxygen use  ? ?No medications for weight loss taken ? ?PV done in person ?

## 2021-11-09 ENCOUNTER — Ambulatory Visit
Admission: RE | Admit: 2021-11-09 | Discharge: 2021-11-09 | Disposition: A | Discharge status: Home / Self Care | Payer: 59 | Source: Ambulatory Visit | Attending: Hematology and Oncology | Admitting: Hematology and Oncology

## 2021-11-09 DIAGNOSIS — D0511 Intraductal carcinoma in situ of right breast: Secondary | ICD-10-CM

## 2021-11-15 ENCOUNTER — Encounter: Payer: Self-pay | Admitting: Gastroenterology

## 2021-11-22 ENCOUNTER — Encounter: Payer: Self-pay | Admitting: Gastroenterology

## 2021-11-22 ENCOUNTER — Ambulatory Visit (AMBULATORY_SURGERY_CENTER): Payer: 59 | Admitting: Gastroenterology

## 2021-11-22 VITALS — BP 130/97 | HR 81 | Temp 98.6°F | Resp 12 | Ht 63.0 in | Wt 145.0 lb

## 2021-11-22 DIAGNOSIS — K2270 Barrett's esophagus without dysplasia: Secondary | ICD-10-CM

## 2021-11-22 DIAGNOSIS — K2970 Gastritis, unspecified, without bleeding: Secondary | ICD-10-CM

## 2021-11-22 DIAGNOSIS — K2950 Unspecified chronic gastritis without bleeding: Secondary | ICD-10-CM | POA: Diagnosis not present

## 2021-11-22 MED ORDER — SODIUM CHLORIDE 0.9 % IV SOLN: 500.0000 mL | Freq: Once | INTRAVENOUS | Status: DC

## 2021-11-22 NOTE — Progress Notes (Signed)
[facility] History and Physical ? ? ?Primary Care Physician:  Ann Held, DO ? ? ?Reason for Procedure:  Barrett's esophagus ? ?Plan:    EGD with possible interventions as needed ? ? ? ? ?HPI: [name] is a very pleasant 46 y.o. female here for EGD for Barrett's esophagus surveillance. ?Denies any nausea, vomiting, abdominal pain, melena or bright red blood per rectum ? ?The risks and benefits as well as alternatives of endoscopic procedure(s) have been discussed and reviewed. All questions answered. The patient agrees to proceed. ? ? ? ?Past Medical History:  ?Diagnosis Date  ? Anemia   ? Back pain   ? Bimalleolar ankle fracture   ? left  ? Breast cancer ([place])   ? right breast- 2020  ? Family history of breast cancer   ? GERD (gastroesophageal reflux disease)   ? Hypertension   ? no meds  ? Kidney infection   ? [place]  ? Personal history of radiation therapy   ? Seizures ([place])   ? from Tamiflu, more than 10 years ago- no instances since  ? ? ?Past Surgical History:  ?Procedure Laterality Date  ? ANTERIOR LUMBAR FUSION  11/14/2011  ? Procedure: ANTERIOR LUMBAR FUSION 2 LEVELS;  Surgeon: Johnn Hai, MD;  Location: [facility];  Service: Orthopedics;  Laterality: N/A;  ALIF L5-S1  ? BACK SURGERY    ? BREAST LUMPECTOMY Right 01/2019  ? BREAST LUMPECTOMY WITH RADIOACTIVE SEED LOCALIZATION Right 01/13/2019  ? Procedure: RIGHT BREAST LUMPECTOMY WITH RADIOACTIVE SEED LOCALIZATION X3;  Surgeon: Erroll Luna, MD;  Location: [facility];  Service: General;  Laterality: Right;  ? CESAREAN SECTION    ? 2007- /w epidural  anesthesia   ? COLONOSCOPY    ? LUMBAR LAMINECTOMY/DECOMPRESSION MICRODISCECTOMY Right 12/15/2015  ? Procedure:  MICO LUMBER DECOMPRESSION L4-5 ON THE RIGHT, ;  Surgeon: Susa Day, MD;  Location: [facility];  Service: Orthopedics;  Laterality: Right;  ? ORIF ANKLE FRACTURE Left 10/03/2017  ? Procedure: OPEN REDUCTION INTERNAL FIXATION (ORIF) ANKLE  BIMALLEOLAR FRACTURE; possible deltoid ligament repair;  Surgeon: Wylene Simmer, MD;  Location: [facility];  Service: Orthopedics;  Laterality: Left;  ? SPINE SURGERY    ? fusion  ? UPPER GASTROINTESTINAL ENDOSCOPY    ? WISDOM TOOTH EXTRACTION    ? young adult  ? ? ?Prior to Admission medications   ?Medication Sig Start Date End Date Taking? Authorizing Provider  ?levonorgestrel (MIRENA) 20 MCG/24HR IUD 1 each by Intrauterine route once.   Yes [provider]  ?estradiol (ESTRACE VAGINAL) 0.1 MG/GM vaginal cream Place 1 Applicatorful vaginally at bedtime. ?Patient not taking: Reported on 11/08/2021 07/26/21   [name], [name], MD  ?losartan (COZAAR) 25 MG tablet TAKE 1 TABLET BY MOUTH EVERY DAY 06/07/21   Carollee Herter, Alferd Apa, DO  ?omeprazole (PRILOSEC) 40 MG capsule TAKE 1 CAPSULE BY MOUTH TWICE A DAY ?Patient taking differently: daily 07/10/21   Mauri Pole, MD  ?tamoxifen (NOLVADEX) 20 MG tablet Take 1 tablet (20 mg total) by mouth daily. 07/26/21   [name], [name], MD  ?valACYclovir (VALTREX) 1000 MG tablet Take 1 tablet (1,000 mg total) by mouth 3 (three) times daily as needed. For flare ups 06/29/20   Ann Held, DO  ?Norgestim-Eth Estrad Triphasic (ORTHO TRI-CYCLEN, 28, PO) Take 0.035 mg by mouth daily.   10/02/17  [provider]  ? ? ?Current Outpatient Medications  ?Medication Sig Dispense Refill  ?  levonorgestrel (MIRENA) 20 MCG/24HR IUD 1 each by Intrauterine route once.    ? estradiol (ESTRACE VAGINAL) 0.1 MG/GM vaginal cream Place 1 Applicatorful vaginally at bedtime. (Patient not taking: Reported on 11/08/2021) 42.5 g 12  ? losartan (COZAAR) 25 MG tablet TAKE 1 TABLET BY MOUTH EVERY DAY 90 tablet 1  ? omeprazole (PRILOSEC) 40 MG capsule TAKE 1 CAPSULE BY MOUTH TWICE A DAY (Patient taking differently: daily) 60 capsule 11  ? tamoxifen (NOLVADEX) 20 MG tablet Take 1 tablet (20 mg total) by mouth daily. 90 tablet 4  ? valACYclovir (VALTREX) 1000 MG tablet  Take 1 tablet (1,000 mg total) by mouth 3 (three) times daily as needed. For flare ups 90 tablet 2  ? ?Current Facility-Administered Medications  ?Medication Dose Route Frequency Provider Last Rate Last Admin  ? 0.9 %  sodium chloride infusion  500 mL Intravenous Once Steadman Prosperi, Venia Minks, MD      ? ? ?Allergies as of 11/22/2021 - Review Complete 11/22/2021  ?Allergen Reaction Noted  ? Tamiflu [oseltamivir phosphate] Other (See Comments) 11/19/2013  ? Ciprofloxacin Diarrhea and Nausea And Vomiting 09/04/2011  ? Metoclopramide hcl Other (See Comments) 10/08/2007  ? Sulfa antibiotics Nausea And Vomiting 10/30/2011  ? ? ?Family History  ?Problem Relation Age of Onset  ? Lupus Mother   ? Breast cancer Maternal Aunt   ? Anesthesia problems Neg Hx   ? Colon cancer Neg Hx   ? Colon polyps Neg Hx   ? Esophageal cancer Neg Hx   ? Rectal cancer Neg Hx   ? Stomach cancer Neg Hx   ? ? ?Social History  ? ?Socioeconomic History  ? Marital status: Married  ?  Spouse name: Not on file  ? Number of children: 1  ? Years of education: Not on file  ? Highest education level: Not on file  ?Occupational History  ?  Comment: working full time  ?Tobacco Use  ? Smoking status: Former  ?  Packs/day: 0.25  ?  Years: 15.00  ?  Pack years: 3.75  ?  Types: E-cigarettes, Cigarettes  ?  Quit date: 04/01/2017  ?  Years since quitting: 4.6  ? Smokeless tobacco: Never  ? Tobacco comments:  ?  vapes daily  ?Vaping Use  ? Vaping Use: Every day  ? Substances: Nicotine  ?Substance and Sexual Activity  ? Alcohol use: Yes  ?  Alcohol/week: 0.0 standard drinks  ?  Comment: a glass of wine  on occas.   ? Drug use: No  ? Sexual activity: Yes  ?  Birth control/protection: I.U.D.  ?Other Topics Concern  ? Not on file  ?Social History Narrative  ? Not on file  ? ?Social Determinants of Health  ? ?Financial Resource Strain: Not on file  ?Food Insecurity: Not on file  ?Transportation Needs: Not on file  ?Physical Activity: Not on file  ?Stress: Not on file  ?Social  Connections: Not on file  ?Intimate Partner Violence: Not on file  ? ? ?Review of Systems: ? ?All other review of systems negative except as mentioned in the HPI. ? ?Physical Exam: ?Vital signs in last 24 hours: ?BP (!) 131/91   Pulse 87   Temp 98.6 ?F (37 ?C)   Ht '5\' 3"'$  (1.6 m)   Wt 145 lb (65.8 kg)   SpO2 99%   BMI 25.69 kg/m?  ?General:   Alert, NAD ?Lungs:  Clear .   ?Heart:  Regular rate and rhythm ?Abdomen:  Soft, nontender and nondistended. ?  Neuro/Psych:  Alert and cooperative. Normal mood and affect. A and O x 3 ? ?Reviewed labs, radiology imaging, old records and pertinent past GI work up ? ?Patient is appropriate for planned procedure(s) and anesthesia in an ambulatory setting ? ? ?K. Denzil Magnuson , MD ?[phone]  ? ? ?  ?

## 2021-11-22 NOTE — Progress Notes (Signed)
Called to room to assist during endoscopic procedure.  Patient ID and intended procedure confirmed with present staff. Received instructions for my participation in the procedure from the performing physician.  

## 2021-11-22 NOTE — Progress Notes (Signed)
Sedate, gd SR, tolerated procedure well, VSS, report to RN 

## 2021-11-22 NOTE — Op Note (Addendum)
[facility] ?Patient Name: [name] ?Procedure Date: 11/22/2021 11:21 AM ?MRN: [id number] ?Endoscopist: Mauri Pole , MD ?Age: 46 ?Referring MD:  ?Date of Birth: [birth year] ?Gender: Female ?Account #: [id number] ?Procedure:                Upper GI endoscopy ?Indications:              Surveillance for malignancy due to personal history  ?                          of Barrett's esophagus ?Medicines:                Monitored Anesthesia Care ?Procedure:                Pre-Anesthesia Assessment: ?                          - Prior to the procedure, a History and Physical  ?                          was performed, and patient medications and  ?                          allergies were reviewed. The patient's tolerance of  ?                          previous anesthesia was also reviewed. The risks  ?                          and benefits of the procedure and the sedation  ?                          options and risks were discussed with the patient.  ?                          All questions were answered, and informed consent  ?                          was obtained. Prior Anticoagulants: The patient has  ?                          taken no previous anticoagulant or antiplatelet  ?                          agents. ASA Grade Assessment: II - A patient with  ?                          mild systemic disease. After reviewing the risks  ?                          and benefits, the patient was deemed in  ?                          satisfactory condition to undergo the procedure. ?  After obtaining informed consent, the endoscope was  ?                          passed under direct vision. Throughout the  ?                          procedure, the patient's blood pressure, pulse, and  ?                          oxygen saturations were monitored continuously. The  ?                          Endoscope was introduced through the mouth, and  ?                          advanced to the second  part of duodenum. The upper  ?                          GI endoscopy was accomplished without difficulty.  ?                          The patient tolerated the procedure well. ?Scope In: ?Scope Out: ?Findings:                 There were esophageal mucosal changes consistent  ?                          with long-segment Barrett's esophagus present in  ?                          the lower third of the esophagus. The maximum  ?                          longitudinal extent of these mucosal changes was 10  ?                          cm in length. Mucosa was biopsied with a cold  ?                          forceps for histology randomly at intervals of 1 cm  ?                          from 25 to 35 cm from the incisors. One specimen  ?                          bottle was sent to pathology. ?                          A 3 cm hiatal hernia was present. ?                          Patchy mild inflammation characterized by  ?  congestion (edema), erosions and erythema was found  ?                          in the entire examined stomach. Biopsies were taken  ?                          with a cold forceps for Helicobacter pylori testing. ?                          The cardia and gastric fundus were normal on  ?                          retroflexion. ?                          The examined duodenum was normal. ?Complications:            No immediate complications. ?Estimated Blood Loss:     Estimated blood loss was minimal. ?Impression:               - Esophageal mucosal changes consistent with  ?                          long-segment Barrett's esophagus. Biopsied. ?                          - 3 cm hiatal hernia. ?                          - Gastritis. Biopsied. ?                          - Normal examined duodenum. ?Recommendation:           - Patient has a contact number available for  ?                          emergencies. The signs and symptoms of potential  ?                          delayed  complications were discussed with the  ?                          patient. Return to normal activities tomorrow.  ?                          Written discharge instructions were provided to the  ?                          patient. ?                          - Resume previous diet. ?                          - Continue present medications. ?                          -  Await pathology results. ?                          - Repeat upper endoscopy in 3 years for  ?                          surveillance of Barrett's esophagus. ?Mauri Pole, MD ?11/22/2021 11:50:28 AM ?This report has been signed electronically. ?

## 2021-11-22 NOTE — Patient Instructions (Signed)
YOU HAD AN ENDOSCOPIC PROCEDURE TODAY AT THE [facility]:   Refer to the procedure report that was given to you for any specific questions about what was found during the examination.  If the procedure report does not answer your questions, please call your gastroenterologist to clarify.  If you requested that your care partner not be given the details of your procedure findings, then the procedure report has been included in a sealed envelope for you to review at your convenience later.  YOU SHOULD EXPECT: Some feelings of bloating in the abdomen. Passage of more gas than usual.  Walking can help get rid of the air that was put into your GI tract during the procedure and reduce the bloating. If you had a lower endoscopy (such as a colonoscopy or flexible sigmoidoscopy) you may notice spotting of blood in your stool or on the toilet paper. If you underwent a bowel prep for your procedure, you may not have a normal bowel movement for a few days.  Please Note:  You might notice some irritation and congestion in your nose or some drainage.  This is from the oxygen used during your procedure.  There is no need for concern and it should clear up in a day or so.  SYMPTOMS TO REPORT IMMEDIATELY:    Following upper endoscopy (EGD)  Vomiting of blood or coffee ground material  New chest pain or pain under the shoulder blades  Painful or persistently difficult swallowing  New shortness of breath  Fever of 100F or higher  Black, tarry-looking stools  For urgent or emergent issues, a gastroenterologist can be reached at any hour by calling [phone]. Do not use MyChart messaging for urgent concerns.    DIET:  We do recommend a small meal at first, but then you may proceed to your regular diet.  Drink plenty of fluids but you should avoid alcoholic beverages for 24 hours.  ACTIVITY:  You should plan to take it easy for the rest of today and you should NOT DRIVE or use heavy machinery  until tomorrow (because of the sedation medicines used during the test).    FOLLOW UP: Our staff will call the number listed on your records 48-72 hours following your procedure to check on you and address any questions or concerns that you may have regarding the information given to you following your procedure. If we do not reach you, we will leave a message.  We will attempt to reach you two times.  During this call, we will ask if you have developed any symptoms of COVID 19. If you develop any symptoms (ie: fever, flu-like symptoms, shortness of breath, cough etc.) before then, please call [phone].  If you test positive for Covid 19 in the 2 weeks post procedure, please call and report this information to us.    If any biopsies were taken you will be contacted by phone or by letter within the next 1-3 weeks.  Please call us at [phone] if you have not heard about the biopsies in 3 weeks.    SIGNATURES/CONFIDENTIALITY: You and/or your care partner have signed paperwork which will be entered into your electronic medical record.  These signatures attest to the fact that that the information above on your After Visit Summary has been reviewed and is understood.  Full responsibility of the confidentiality of this discharge information lies with you and/or your care-partner. 

## 2021-11-22 NOTE — Progress Notes (Signed)
Pt's states no medical or surgical changes since previsit or office visit. 

## 2021-11-23 ENCOUNTER — Telehealth: Payer: Self-pay | Admitting: Gastroenterology

## 2021-11-23 NOTE — Telephone Encounter (Signed)
Usually EGD does not lead to lymph node swelling in the neck, please advise patient to call back with any changes in symptoms.  Noted she is not having any difficulty with swallowing or breathing.  Please advise patient to contact PMD if she continues to have swollen and painful lymph node.  Thank you ?

## 2021-11-23 NOTE — Telephone Encounter (Signed)
Inbound call from patient had procedure 4/19. Reports her left lymph node is swollen ?

## 2021-11-23 NOTE — Telephone Encounter (Signed)
Pt states she is having swelling and tenderness in her lymph node on the left side of her neck.She is able to swallow with out difficulty. She want to make a note of it so we would know next time." ?

## 2021-11-24 ENCOUNTER — Telehealth: Payer: Self-pay

## 2021-11-24 NOTE — Telephone Encounter (Signed)
?  Follow up Call- ? ? ?  11/22/2021  ? 10:49 AM  ?Call back number  ?Post procedure Call Back phone  # [phone]  ?Permission to leave phone message Yes  ?  ? ?Patient questions: ? ?Do you have a fever, pain , or abdominal swelling? No. ?Pain Score  0 * ? ?Have you tolerated food without any problems? Yes.   ? ?Have you been able to return to your normal activities? Yes.   ? ?Do you have any questions about your discharge instructions: ?Diet   No. ?Medications  No. ?Follow up visit  No. ? ?Do you have questions or concerns about your Care? No. ? ?Actions: ?* If pain score is 4 or above: ?No action needed, pain <4. ? ? ? ? ? ?

## 2021-11-29 ENCOUNTER — Telehealth: Payer: Self-pay | Admitting: *Deleted

## 2021-11-29 NOTE — Telephone Encounter (Signed)
Opened in error

## 2021-12-01 ENCOUNTER — Encounter: Payer: Self-pay | Admitting: Gastroenterology

## 2021-12-23 ENCOUNTER — Other Ambulatory Visit: Payer: Self-pay | Admitting: Family Medicine

## 2021-12-23 DIAGNOSIS — I10 Essential (primary) hypertension: Secondary | ICD-10-CM

## 2022-04-25 ENCOUNTER — Inpatient Hospital Stay: Payer: 59 | Admitting: Hematology and Oncology

## 2022-05-15 ENCOUNTER — Inpatient Hospital Stay: Payer: Self-pay | Admitting: Hematology and Oncology

## 2022-06-05 ENCOUNTER — Encounter: Payer: Self-pay | Admitting: Hematology and Oncology

## 2022-06-05 ENCOUNTER — Inpatient Hospital Stay: Payer: 59 | Attending: Hematology and Oncology | Admitting: Hematology and Oncology

## 2022-06-05 ENCOUNTER — Other Ambulatory Visit: Payer: Self-pay

## 2022-06-05 VITALS — BP 135/88 | HR 99 | Temp 97.9°F | Resp 18 | Ht 63.0 in | Wt 143.0 lb

## 2022-06-05 DIAGNOSIS — I10 Essential (primary) hypertension: Secondary | ICD-10-CM | POA: Insufficient documentation

## 2022-06-05 DIAGNOSIS — Z79810 Long term (current) use of selective estrogen receptor modulators (SERMs): Secondary | ICD-10-CM | POA: Diagnosis not present

## 2022-06-05 DIAGNOSIS — D0511 Intraductal carcinoma in situ of right breast: Secondary | ICD-10-CM | POA: Diagnosis present

## 2022-06-05 DIAGNOSIS — Z170 Estrogen receptor positive status [ER+]: Secondary | ICD-10-CM | POA: Insufficient documentation

## 2022-06-05 DIAGNOSIS — Z803 Family history of malignant neoplasm of breast: Secondary | ICD-10-CM | POA: Insufficient documentation

## 2022-06-05 DIAGNOSIS — Z79899 Other long term (current) drug therapy: Secondary | ICD-10-CM | POA: Diagnosis not present

## 2022-06-05 DIAGNOSIS — F17210 Nicotine dependence, cigarettes, uncomplicated: Secondary | ICD-10-CM | POA: Insufficient documentation

## 2022-06-05 DIAGNOSIS — K219 Gastro-esophageal reflux disease without esophagitis: Secondary | ICD-10-CM | POA: Insufficient documentation

## 2022-06-05 DIAGNOSIS — Z923 Personal history of irradiation: Secondary | ICD-10-CM | POA: Insufficient documentation

## 2022-06-05 NOTE — Progress Notes (Addendum)
[place]  [phone] [phone]    ID: [name] DOB: Aug 02, 1976  MR#: [id number]  XLK#:[id number]  Patient Care Team: Carollee Herter, Alferd Apa, DO as PCP - General Susa Day, MD (Orthopedic Surgery) Mauro Kaufmann, RN as Oncology Nurse Navigator Rockwell Germany, RN as Oncology Nurse Navigator Erroll Luna, MD as Consulting Physician (General Surgery) [name], [name], MD (Inactive) as Consulting Physician (Oncology) Kyung Rudd, MD as Consulting Physician (Radiation Oncology) Everlene Farrier, MD as Consulting Physician (Obstetrics and Gynecology) Wylene Simmer, MD as Consulting Physician (Orthopedic Surgery) OTHER MD:    CHIEF COMPLAINT: DCIS  CURRENT TREATMENT: tamoxifen  INTERVAL HISTORY:  [name] returns today for 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 She started on tamoxifen on 05/07/2019.  She has been taking it as prescribed.  She denies any complaints with it.  She continues on intensified screening.  Last mammogram did not show any evidence of malignancy.  She tells me that her dad is a physician and was wondering if he can have access to her records.  This is certainly possible provided the patient gives permission.  She otherwise denies any changes in her breast although she is not quite doing self breast exam on a monthly basis.  REVIEW OF SYSTEMS: Rest of the pertinent 10 point ROS reviewed and negative  HISTORY OF CURRENT ILLNESS: From the original intake note:  "[name]" Azuri Bozard underwent bilateral diagnostic mammography with tomography at [facility] on 09/12/2018 showing: Breast Density Category C.   In the right breast, magnified views demonstrate a group of fine pleomorphic calcifications in the lower outer quadrant of the right breast spanning 2.8 x 1.7 x 5.0 cm. Calcifications are not typical for fat necrosis.   Accordingly on 09/17/2018 she proceeded to biopsy of the right  breast area in question. The pathology from this procedure showed ([id number]): ductal carcinoma in situ with calcifications, high grade. Prognostic indicators significant for: estrogen receptor, 0% negative and progesterone receptor, 0% negative.   In the left breast, magnified views of the left breast calcifications demonstrate a group of faint pleomorphic calcifications in the upper-outer quadrant measuring approximately 0.8 cm in diameter. Calcifications are not typical for fat necrosis.   Accordingly, she underwent a biopsy of the right breast area in question on 09/17/2018. The pathology from this procedure showed ([id number]): fibrocystic changes with adenosis and calcifications. There is no evidence of malignancy.    The patient's subsequent history is as detailed below.   PAST MEDICAL HISTORY: Past Medical History:  Diagnosis Date   Anemia    Back pain    Bimalleolar ankle fracture    left   Breast cancer ([place])    right breast- 2020   Family history of breast cancer    GERD (gastroesophageal reflux disease)    Hypertension    no meds   Kidney infection    1998- hosp[facility]   Personal history of radiation therapy    Seizures ([place])    from Tamiflu, more than 10 years ago- no instances since    PAST SURGICAL HISTORY: Past Surgical History:  Procedure Laterality Date   ANTERIOR LUMBAR FUSION  11/14/2011   Procedure: ANTERIOR LUMBAR FUSION 2 LEVELS;  Surgeon: Johnn Hai, MD;  Location: [facility];  Service: Orthopedics;  Laterality: N/A;  ALIF L5-S1   BACK SURGERY     BREAST LUMPECTOMY Right 01/2019   BREAST LUMPECTOMY WITH RADIOACTIVE SEED LOCALIZATION Right 01/13/2019   Procedure: RIGHT BREAST  LUMPECTOMY WITH RADIOACTIVE SEED LOCALIZATION X3;  Surgeon: Erroll Luna, MD;  Location: [facility];  Service: General;  Laterality: Right;   CESAREAN SECTION     2007- /w epidural  anesthesia    COLONOSCOPY     LUMBAR LAMINECTOMY/DECOMPRESSION MICRODISCECTOMY Right  12/15/2015   Procedure:  MICO LUMBER DECOMPRESSION L4-5 ON THE RIGHT, ;  Surgeon: Susa Day, MD;  Location: [facility];  Service: Orthopedics;  Laterality: Right;   ORIF ANKLE FRACTURE Left 10/03/2017   Procedure: OPEN REDUCTION INTERNAL FIXATION (ORIF) ANKLE BIMALLEOLAR FRACTURE; possible deltoid ligament repair;  Surgeon: Wylene Simmer, MD;  Location: [facility];  Service: Orthopedics;  Laterality: Left;   SPINE SURGERY     fusion   UPPER GASTROINTESTINAL ENDOSCOPY     WISDOM TOOTH EXTRACTION     young adult    FAMILY HISTORY: Family History  Problem Relation Age of Onset   Lupus Mother    Breast cancer Maternal Aunt    Anesthesia problems Neg Hx    Colon cancer Neg Hx    Colon polyps Neg Hx    Esophageal cancer Neg Hx    Rectal cancer Neg Hx    Stomach cancer Neg Hx   Diana father and mother are both 52 as of 09/2018. The patient has 2 sisters. Diana paternal aunt was diagnosed with breast cancer at 61. Patient denies anyone in her family having breast, ovarian, prostate, or pancreatic cancer.    GYNECOLOGIC HISTORY:  No LMP recorded. (Menstrual status: IUD). 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 Their son, [name], is 39 years old as of December 2022. Diana father, [name], is a retired Consulting civil engineer.    ADVANCED DIRECTIVES: Her husband, BJ, is automatically her healthcare power of attorney.     HEALTH MAINTENANCE: Social History   Tobacco Use   Smoking status: Former    Packs/day: 0.25    Years: 15.00    Total pack years: 3.75    Types: E-cigarettes, Cigarettes    Quit date: 04/01/2017    Years since quitting: 5.1   Smokeless tobacco: Never   Tobacco comments:    vapes daily   Vaping Use   Vaping Use: Every day   Substances: Nicotine  Substance Use Topics   Alcohol use: Yes    Alcohol/week: 0.0 standard drinks of alcohol    Comment: a glass of wine  on occas.    Drug use: No    Colonoscopy: yes, 2005  PAP: 09/12/2018  Bone density: no   Allergies  Allergen Reactions   Tamiflu [Oseltamivir Phosphate] Other (See Comments)    Seizure   Ciprofloxacin Diarrhea and Nausea And Vomiting   Metoclopramide Hcl Other (See Comments)    "lock jaw"   Sulfa Antibiotics Nausea And Vomiting    Current Outpatient Medications  Medication Sig Dispense Refill   estradiol (ESTRACE VAGINAL) 0.1 MG/GM vaginal cream Place 1 Applicatorful vaginally at bedtime. (Patient not taking: Reported on 11/08/2021) 42.5 g 12   levonorgestrel (MIRENA) 20 MCG/24HR IUD 1 each by Intrauterine route once.     losartan (COZAAR) 25 MG tablet TAKE 1 TABLET BY MOUTH EVERY DAY 90 tablet 1   omeprazole (PRILOSEC) 40 MG capsule  TAKE 1 CAPSULE BY MOUTH TWICE A DAY (Patient taking differently: daily) 60 capsule 11   tamoxifen (NOLVADEX) 20 MG tablet Take 1 tablet (20 mg total) by mouth daily. 90 tablet 4   valACYclovir (VALTREX) 1000 MG tablet Take 1 tablet (1,000 mg total) by mouth 3 (three) times daily as needed. For flare ups 90 tablet 2   No current facility-administered medications for this visit.    OBJECTIVE: white woman in no acute distress  Vitals:   06/05/22 0849  BP: 135/88  Pulse: 99  Resp: 18  Temp: 97.9 F (36.6 C)  SpO2: 100%      Body mass index is 25.33 kg/m.   Wt Readings from Last 3 Encounters:  06/05/22 143 lb (64.9 kg)  11/22/21 145 lb (65.8 kg)  11/08/21 145 lb (65.8 kg)     ECOG FS:1 - Symptomatic but completely ambulatory  Physical Exam Constitutional:      Appearance: Normal appearance.  Cardiovascular:     Rate and Rhythm: Normal rate and regular rhythm.     Pulses: Normal pulses.     Heart sounds: Normal heart sounds.  Pulmonary:     Effort: Pulmonary  effort is normal.     Breath sounds: Normal breath sounds.  Chest:     Comments: Bilateral breasts inspected.  No palpable masses or regional adenopathy. Abdominal:     General: Abdomen is flat. Bowel sounds are normal.     Palpations: Abdomen is soft.  Musculoskeletal:        General: Normal range of motion.     Cervical back: Normal range of motion and neck supple. No rigidity.  Lymphadenopathy:     Cervical: No cervical adenopathy.  Skin:    General: Skin is warm and dry.  Neurological:     General: No focal deficit present.     Mental Status: She is alert.       LAB RESULTS:  CMP     Component Value Date/Time   NA 137 04/05/2020 1011   K 3.6 04/05/2020 1011   CL 102 04/05/2020 1011   CO2 26 04/05/2020 1011   GLUCOSE 103 (H) 04/05/2020 1011   BUN 9 04/05/2020 1011   CREATININE 0.71 04/05/2020 1011   CREATININE 0.78 07/27/2019 1133   CALCIUM 9.7 04/05/2020 1011   PROT 7.9 04/05/2020 1011   ALBUMIN 4.2 04/05/2020 1011   AST 73 (H) 04/05/2020 1011   AST 89 (H) 07/27/2019 1133   ALT 41 04/05/2020 1011   ALT 56 (H) 07/27/2019 1133   ALKPHOS 38 04/05/2020 1011   BILITOT 0.4 04/05/2020 1011   BILITOT 0.6 07/27/2019 1133   GFRNONAA >60 04/05/2020 1011   GFRNONAA >60 07/27/2019 1133   GFRAA >60 04/05/2020 1011   GFRAA >60 07/27/2019 1133    No results found for: "TOTALPROTELP", "ALBUMINELP", "A1GS", "A2GS", "BETS", "BETA2SER", "GAMS", "MSPIKE", "SPEI"  No results found for: "KPAFRELGTCHN", "LAMBDASER", "KAPLAMBRATIO"  Lab Results  Component Value Date   WBC 3.0 (L) 04/05/2020   NEUTROABS 1.6 (L) 04/05/2020   HGB 12.9 04/05/2020   HCT 37.4 04/05/2020   MCV 99.5 04/05/2020   PLT 151 04/05/2020   No results found for: "LABCA2"  No components found for: "JKDTOI712"  No results for input(s): "INR" in the last 168 hours.  No results found for: "LABCA2"  No results found for: "WPY099"  No results found for: "CAN125"  No results found for: "CAN153"  No  results found for: "CA2729"  No components found for: "HGQUANT"  No results found for: "CEA1", "CEA" / No results found for: "CEA1", "CEA"   No results found for: "AFPTUMOR"  No results found for: "CHROMOGRNA"  No results found for: "TOTALPROTELP", "ALBUMINELP", "A1GS", "A2GS", "BETS", "BETA2SER", "GAMS", "MSPIKE", "SPEI" (this displays SPEP labs)  No results found for: "KPAFRELGTCHN", "LAMBDASER", "KAPLAMBRATIO" (kappa/lambda light chains)  No results found for: "HGBA", "HGBA2QUANT", "HGBFQUANT", "HGBSQUAN" (Hemoglobinopathy evaluation)   No results found for: "LDH"  No results found for: "IRON", "TIBC", "IRONPCTSAT" (Iron and TIBC)  No results found for: "FERRITIN"  Urinalysis    Component Value Date/Time   COLORURINE YELLOW 11/08/2011 0912   APPEARANCEUR CLEAR 11/08/2011 0912   LABSPEC 1.023 11/08/2011 0912   PHURINE 5.0 11/08/2011 0912   GLUCOSEU NEGATIVE 11/08/2011 0912   GLUCOSEU NEG mg/dL 09/25/2006 2234   HGBUR NEGATIVE 11/08/2011 0912   HGBUR large 06/01/2009 1449   BILIRUBINUR small (A) 03/20/2019 1142   BILIRUBINUR neg 01/20/2018 1751   KETONESUR negative 03/20/2019 1142   KETONESUR NEGATIVE 11/08/2011 0912   PROTEINUR =30 (A) 03/20/2019 1142   PROTEINUR Positive (A) 01/20/2018 1751   PROTEINUR NEGATIVE 11/08/2011 0912   UROBILINOGEN 2.0 (A) 03/20/2019 1142   UROBILINOGEN 0.2 11/08/2011 0912   NITRITE Positive (A) 03/20/2019 1142   NITRITE neg 01/20/2018 1751   NITRITE NEGATIVE 11/08/2011 0912   LEUKOCYTESUR Large (3+) (A) 03/20/2019 1142    STUDIES:  No results found.   ELIGIBLE FOR AVAILABLE RESEARCH PROTOCOL: no   ASSESSMENT: 46 y.o. [place], [place] woman that is post right breast biopsy 09/17/2018 for a ductal carcinoma in situ, grade 3, estrogen and progesterone receptor negative  (a) biopsy of a suspicious area in the left breast 09/17/2018 was benign  (b) additional right breast biopsies obtained 10/14/2018, both from the lower outer  quadrant, showed ductal carcinoma in situ, high to intermediate grade, 1 of which was estrogen and progesterone receptor negative, the other one strongly estrogen and progesterone receptor positive  (1) genetic testing on 09/24/2018 through [facility]'s Breast Cancer STAT Panel + Common Hereditary Cancers Panel showed no pathogenic mutations. The STAT Breast cancer panel offered by [facility] includes sequencing and rearrangement analysis for the following 9 genes:  ATM, BRCA1, BRCA2, CDH1, CHEK2, PALB2, PTEN, STK11 and TP53. The Common Hereditary Cancers Panel offered by [facility] includes sequencing and/or deletion duplication testing of the following 47 genes: APC, ATM, AXIN2, BARD1, BMPR1A, BRCA1, BRCA2, BRIP1, CDH1, CDKN2A (p14ARF), CDKN2A (p16INK4a), CKD4, CHEK2, CTNNA1, DICER1, EPCAM (Deletion/duplication testing only), GREM1 (promoter region deletion/duplication testing only), KIT, MEN1, MLH1, MSH2, MSH3, MSH6, MUTYH, NBN, NF1, NHTL1, PALB2, PDGFRA, PMS2, POLD1, POLE, PTEN, RAD50, RAD51C, RAD51D, SDHB, SDHC, SDHD, SMAD4, SMARCA4. STK11, TP53, TSC1, TSC2, and VHL.  The following genes were evaluated for sequence changes only: SDHA and HOXB13 c.251G>A variant only.  (2) right lumpectomy 01/13/2019 showed ductal carcinoma in situ, grade 3, measuring 4 cm, with negative resection margins.  (3) adjuvant radiation 02/23/2019 - 04/14/2019  (a) right breast / 50 Gy in 25 fractions  (b) seroma boost / 14.4 Gy in 8 fractions  (4) started tamoxifen 05/07/2019   PLAN:  Patient is tolerating tamoxifen well.  She denies any new adverse effects. Physical examination unremarkable, no palpable masses or regional adenopathy We have reviewed her most recent MRI which is unremarkable.  We once again discussed about the unknown long-term risks of gadolinium deposition, there is some concern about cognitive decline with gadolinium however this has not been proven so far.  Next MRI ordered for Nov 2023. She was encouraged  to do self breast exam and return to clinic sooner as needed or in 1 year.  Total encounter time 30 minutes.*  *Total Encounter Time as defined by the Centers for Medicare and Medicaid Services includes, in addition to the face-to-face time of a patient visit (documented in the note above) non-face-to-face time: obtaining and reviewing outside history, ordering and reviewing medications, tests or procedures, care coordination (communications with other health care professionals or caregivers) and documentation in the medical record.

## 2022-06-12 ENCOUNTER — Ambulatory Visit (HOSPITAL_COMMUNITY): Admission: RE | Admit: 2022-06-12 | Payer: 59 | Source: Ambulatory Visit

## 2022-07-26 ENCOUNTER — Other Ambulatory Visit: Payer: Self-pay | Admitting: Gastroenterology

## 2022-07-26 DIAGNOSIS — K219 Gastro-esophageal reflux disease without esophagitis: Secondary | ICD-10-CM

## 2022-07-26 DIAGNOSIS — K22719 Barrett's esophagus with dysplasia, unspecified: Secondary | ICD-10-CM

## 2022-09-06 DIAGNOSIS — C50919 Malignant neoplasm of unspecified site of unspecified female breast: Secondary | ICD-10-CM

## 2022-09-06 HISTORY — DX: Malignant neoplasm of unspecified site of unspecified female breast: C50.919

## 2022-09-12 ENCOUNTER — Other Ambulatory Visit: Payer: Self-pay | Admitting: Hematology and Oncology

## 2022-09-12 ENCOUNTER — Ambulatory Visit
Admission: RE | Admit: 2022-09-12 | Discharge: 2022-09-12 | Disposition: A | Discharge status: Home / Self Care | Payer: 59 | Source: Ambulatory Visit | Attending: Hematology and Oncology | Admitting: Hematology and Oncology

## 2022-09-12 DIAGNOSIS — D0511 Intraductal carcinoma in situ of right breast: Secondary | ICD-10-CM

## 2022-09-12 DIAGNOSIS — R9389 Abnormal findings on diagnostic imaging of other specified body structures: Secondary | ICD-10-CM

## 2022-09-12 MED ORDER — GADOPICLENOL 0.5 MMOL/ML IV SOLN
7.0000 mL | Freq: Once | INTRAVENOUS | Status: AC | PRN
  Administered 2022-09-12: 7 mL via INTRAVENOUS

## 2022-09-13 ENCOUNTER — Telehealth: Payer: Self-pay | Admitting: Hematology and Oncology

## 2022-09-13 ENCOUNTER — Other Ambulatory Visit: Payer: Self-pay | Admitting: Hematology and Oncology

## 2022-09-13 MED ORDER — TRAMADOL HCL 50 MG PO TABS: 50.0000 mg | ORAL_TABLET | Freq: Two times a day (BID) | ORAL | 0 refills | Status: DC | PRN

## 2022-09-13 NOTE — Telephone Encounter (Signed)
Pt called to inquire if she could she be prescribed something for pain before her biopsy on 09/24/22. Message discussed and forwarded to Dr Chryl Heck.

## 2022-09-13 NOTE — Progress Notes (Signed)
Tramadol sent to pharmacy for pain associated with MRI biopsy  [name]

## 2022-09-19 ENCOUNTER — Telehealth: Payer: Self-pay

## 2022-09-19 NOTE — Telephone Encounter (Signed)
Pt called and requests anxiolytic for day of br bx 09/24/22. She requests [facility] [address]. Advised pt I would make MD aware of her request.

## 2022-09-20 ENCOUNTER — Other Ambulatory Visit: Payer: Self-pay | Admitting: Hematology and Oncology

## 2022-09-20 MED ORDER — DIAZEPAM 5 MG PO TABS: 5.0000 mg | ORAL_TABLET | Freq: Once | ORAL | 0 refills | Status: AC

## 2022-09-21 ENCOUNTER — Telehealth: Payer: Self-pay | Admitting: Hematology and Oncology

## 2022-09-21 ENCOUNTER — Inpatient Hospital Stay: Payer: 59 | Admitting: Hematology and Oncology

## 2022-09-21 NOTE — Progress Notes (Unsigned)
[place]  [phone] [phone]    ID: [name] DOB: May 16, 1976  MR#: [id number]  [id number]  Patient Care Team: Carollee Herter, Alferd Apa, DO as PCP - General Susa Day, MD (Orthopedic Surgery) Mauro Kaufmann, RN as Oncology Nurse Navigator Rockwell Germany, RN as Oncology Nurse Navigator Erroll Luna, MD as Consulting Physician (General Surgery) [name], [name], MD (Inactive) as Consulting Physician (Oncology) Kyung Rudd, MD as Consulting Physician (Radiation Oncology) Everlene Farrier, MD as Consulting Physician (Obstetrics and Gynecology) Wylene Simmer, MD as Consulting Physician (Orthopedic Surgery) OTHER MD:    CHIEF COMPLAINT: DCIS  CURRENT TREATMENT: tamoxifen  INTERVAL HISTORY:  [name] returns today for 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 She started on tamoxifen on 05/07/2019.  She has been taking it as prescribed.  She denies any complaints with it.  She continues on intensified screening.  Last mammogram did not show any evidence of malignancy.  She tells me that her dad is a physician and was wondering if he can have access to her records.  This is certainly possible provided the patient gives permission.  She otherwise denies any changes in her breast although she is not quite doing self breast exam on a monthly basis.  REVIEW OF SYSTEMS: Rest of the pertinent 10 point ROS reviewed and negative  HISTORY OF CURRENT ILLNESS: From the original intake note:  "[name]" [name] underwent bilateral diagnostic mammography with tomography at [facility] on 09/12/2018 showing: Breast Density Category C.   In the right breast, magnified views demonstrate a group of fine pleomorphic calcifications in the lower outer quadrant of the right breast spanning 2.8 x 1.7 x 5.0 cm. Calcifications are not typical for fat necrosis.   Accordingly on 09/17/2018 she proceeded to biopsy of the right  breast area in question. The pathology from this procedure showed [id number]): ductal carcinoma in situ with calcifications, high grade. Prognostic indicators significant for: estrogen receptor, 0% negative and progesterone receptor, 0% negative.   In the left breast, magnified views of the left breast calcifications demonstrate a group of faint pleomorphic calcifications in the upper-outer quadrant measuring approximately 0.8 cm in diameter. Calcifications are not typical for fat necrosis.   Accordingly, she underwent a biopsy of the right breast area in question on 09/17/2018. The pathology from this procedure showed [id number]): fibrocystic changes with adenosis and calcifications. There is no evidence of malignancy.    The patient's subsequent history is as detailed below.   PAST MEDICAL HISTORY: Past Medical History:  Diagnosis Date   Anemia    Back pain    Bimalleolar ankle fracture    left   Breast cancer ([place])    right breast- 2020   Family history of breast cancer    GERD (gastroesophageal reflux disease)    Hypertension    no meds   Kidney infection    1998- hosp.[facility]   Personal history of radiation therapy    Seizures ([place])    from Tamiflu, more than 10 years ago- no instances since    PAST SURGICAL HISTORY: Past Surgical History:  Procedure Laterality Date   ANTERIOR LUMBAR FUSION  11/14/2011   Procedure: ANTERIOR LUMBAR FUSION 2 LEVELS;  Surgeon: Johnn Hai, MD;  Location: [facility];  Service: Orthopedics;  Laterality: N/A;  ALIF L5-S1   BACK SURGERY     BREAST LUMPECTOMY Right 01/2019   BREAST LUMPECTOMY WITH RADIOACTIVE SEED LOCALIZATION Right 01/13/2019   Procedure: RIGHT BREAST  LUMPECTOMY WITH RADIOACTIVE SEED LOCALIZATION X3;  Surgeon: Erroll Luna, MD;  Location: [facility];  Service: General;  Laterality: Right;   CESAREAN SECTION     2007- /w epidural  anesthesia    COLONOSCOPY     LUMBAR LAMINECTOMY/DECOMPRESSION MICRODISCECTOMY Right  12/15/2015   Procedure:  MICO LUMBER DECOMPRESSION L4-5 ON THE RIGHT, ;  Surgeon: Susa Day, MD;  Location: [facility];  Service: Orthopedics;  Laterality: Right;   ORIF ANKLE FRACTURE Left 10/03/2017   Procedure: OPEN REDUCTION INTERNAL FIXATION (ORIF) ANKLE BIMALLEOLAR FRACTURE; possible deltoid ligament repair;  Surgeon: Wylene Simmer, MD;  Location: [facility];  Service: Orthopedics;  Laterality: Left;   SPINE SURGERY     fusion   UPPER GASTROINTESTINAL ENDOSCOPY     WISDOM TOOTH EXTRACTION     young adult    FAMILY HISTORY: Family History  Problem Relation Age of Onset   Lupus Mother    Breast cancer Maternal Aunt    Anesthesia problems Neg Hx    Colon cancer Neg Hx    Colon polyps Neg Hx    Esophageal cancer Neg Hx    Rectal cancer Neg Hx    Stomach cancer Neg Hx   [name]'s father and mother are both 70 as of 09/2018. The patient has 2 sisters. [name]'s paternal aunt was diagnosed with breast cancer at 42. Patient denies anyone in her family having breast, ovarian, prostate, or pancreatic cancer.    GYNECOLOGIC HISTORY:  No LMP recorded. (Menstrual status: IUD). 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 Their son, Zurri Obryan, is 31 years old as of December 2022. [name]'s father, [name], is a retired Consulting civil engineer.    ADVANCED DIRECTIVES: Her husband, BJ, is automatically her healthcare power of attorney.     HEALTH MAINTENANCE: Social History   Tobacco Use   Smoking status: Former    Packs/day: 0.25    Years: 15.00    Total pack years: 3.75    Types: E-cigarettes, Cigarettes    Quit date: 04/01/2017    Years since quitting: 5.4   Smokeless tobacco: Never   Tobacco comments:    vapes daily   Vaping Use   Vaping Use: Every day   Substances: Nicotine  Substance Use Topics   Alcohol use: Yes    Alcohol/week: 0.0 standard drinks of alcohol    Comment: a glass of wine  on occas.    Drug use: No    Colonoscopy: yes, 2005  PAP: 09/12/2018  Bone density: no   Allergies  Allergen Reactions   Tamiflu [Oseltamivir Phosphate] Other (See Comments)    Seizure   Ciprofloxacin Diarrhea and Nausea And Vomiting   Metoclopramide Hcl Other (See Comments)    "lock jaw"   Sulfa Antibiotics Nausea And Vomiting    Current Outpatient Medications  Medication Sig Dispense Refill   traMADol (ULTRAM) 50 MG tablet Take 1 tablet (50 mg total) by mouth every 12 (twelve) hours as needed. 8 tablet 0   estradiol (ESTRACE VAGINAL) 0.1 MG/GM vaginal cream Place 1 Applicatorful vaginally at bedtime. (Patient not taking: Reported on 11/08/2021) 42.5 g 12   levonorgestrel (MIRENA) 20 MCG/24HR IUD 1 each by Intrauterine route once.  losartan (COZAAR) 25 MG tablet TAKE 1 TABLET BY MOUTH EVERY DAY 90 tablet 1   omeprazole (PRILOSEC) 40 MG capsule TAKE 1 CAPSULE BY MOUTH TWICE A DAY 180 capsule 0   tamoxifen (NOLVADEX) 20 MG tablet Take 1 tablet (20 mg total) by mouth daily. 90 tablet 4   valACYclovir (VALTREX) 1000 MG tablet Take 1 tablet (1,000 mg total) by mouth 3 (three) times daily as needed. For flare ups 90 tablet 2   No current facility-administered medications for this visit.    OBJECTIVE: white woman in no acute distress  There were no vitals filed for this visit.     There is no height or weight on file to calculate BMI.   Wt Readings from Last 3 Encounters:  06/05/22 143 lb (64.9 kg)  11/22/21 145 lb (65.8 kg)  11/08/21 145 lb (65.8 kg)     ECOG FS:1 - Symptomatic but completely ambulatory  Physical Exam Constitutional:      Appearance: Normal appearance.  Cardiovascular:     Rate and Rhythm: Normal rate and regular rhythm.     Pulses: Normal pulses.     Heart sounds: Normal  heart sounds.  Pulmonary:     Effort: Pulmonary effort is normal.     Breath sounds: Normal breath sounds.  Chest:     Comments: Bilateral breasts inspected.  No palpable masses or regional adenopathy. Abdominal:     General: Abdomen is flat. Bowel sounds are normal.     Palpations: Abdomen is soft.  Musculoskeletal:        General: Normal range of motion.     Cervical back: Normal range of motion and neck supple. No rigidity.  Lymphadenopathy:     Cervical: No cervical adenopathy.  Skin:    General: Skin is warm and dry.  Neurological:     General: No focal deficit present.     Mental Status: She is alert.       LAB RESULTS:  CMP     Component Value Date/Time   NA 137 04/05/2020 1011   K 3.6 04/05/2020 1011   CL 102 04/05/2020 1011   CO2 26 04/05/2020 1011   GLUCOSE 103 (H) 04/05/2020 1011   BUN 9 04/05/2020 1011   CREATININE 0.71 04/05/2020 1011   CREATININE 0.78 07/27/2019 1133   CALCIUM 9.7 04/05/2020 1011   PROT 7.9 04/05/2020 1011   ALBUMIN 4.2 04/05/2020 1011   AST 73 (H) 04/05/2020 1011   AST 89 (H) 07/27/2019 1133   ALT 41 04/05/2020 1011   ALT 56 (H) 07/27/2019 1133   ALKPHOS 38 04/05/2020 1011   BILITOT 0.4 04/05/2020 1011   BILITOT 0.6 07/27/2019 1133   GFRNONAA >60 04/05/2020 1011   GFRNONAA >60 07/27/2019 1133   GFRAA >60 04/05/2020 1011   GFRAA >60 07/27/2019 1133    No results found for: "TOTALPROTELP", "ALBUMINELP", "A1GS", "A2GS", "BETS", "BETA2SER", "GAMS", "MSPIKE", "SPEI"  No results found for: "KPAFRELGTCHN", "LAMBDASER", "KAPLAMBRATIO"  Lab Results  Component Value Date   WBC 3.0 (L) 04/05/2020   NEUTROABS 1.6 (L) 04/05/2020   HGB 12.9 04/05/2020   HCT 37.4 04/05/2020   MCV 99.5 04/05/2020   PLT 151 04/05/2020   No results found for: "LABCA2"  No components found for: "[id number]"  No results for input(s): "INR" in the last 168 hours.  No results found for: "LABCA2"  No results found for: "[id number]"  No results found for:  "CAN125"  No results found for: "[id number]"  No results found for: "  [id number]"  No components found for: "HGQUANT"  No results found for: "CEA1", "CEA" / No results found for: "CEA1", "CEA"   No results found for: "AFPTUMOR"  No results found for: "CHROMOGRNA"  No results found for: "TOTALPROTELP", "ALBUMINELP", "A1GS", "A2GS", "BETS", "BETA2SER", "GAMS", "MSPIKE", "SPEI" (this displays SPEP labs)  No results found for: "KPAFRELGTCHN", "LAMBDASER", "KAPLAMBRATIO" (kappa/lambda light chains)  No results found for: "HGBA", "HGBA2QUANT", "HGBFQUANT", "HGBSQUAN" (Hemoglobinopathy evaluation)   No results found for: "LDH"  No results found for: "IRON", "TIBC", "IRONPCTSAT" (Iron and TIBC)  No results found for: "FERRITIN"  Urinalysis    Component Value Date/Time   COLORURINE YELLOW 11/08/2011 0912   APPEARANCEUR CLEAR 11/08/2011 0912   LABSPEC 1.023 11/08/2011 0912   PHURINE 5.0 11/08/2011 0912   GLUCOSEU NEGATIVE 11/08/2011 0912   GLUCOSEU NEG mg/dL 09/25/2006 2234   HGBUR NEGATIVE 11/08/2011 0912   HGBUR large 06/01/2009 1449   BILIRUBINUR small (A) 03/20/2019 1142   BILIRUBINUR neg 01/20/2018 1751   KETONESUR negative 03/20/2019 1142   KETONESUR NEGATIVE 11/08/2011 0912   PROTEINUR =30 (A) 03/20/2019 1142   PROTEINUR Positive (A) 01/20/2018 1751   PROTEINUR NEGATIVE 11/08/2011 0912   UROBILINOGEN 2.0 (A) 03/20/2019 1142   UROBILINOGEN 0.2 11/08/2011 0912   NITRITE Positive (A) 03/20/2019 1142   NITRITE neg 01/20/2018 1751   NITRITE NEGATIVE 11/08/2011 0912   LEUKOCYTESUR Large (3+) (A) 03/20/2019 1142    STUDIES:  MR BREAST BILATERAL W WO CONTRAST INC CAD  Result Date: 09/12/2022 CLINICAL DATA:  47 year old female for high lifetime risk for developing breast cancer. Personal history of RIGHT breast cancer, RIGHT lumpectomy and radiation in 2020. EXAM: BILATERAL BREAST MRI WITH AND WITHOUT CONTRAST TECHNIQUE: Multiplanar, multisequence MR images of both breasts were  obtained prior to and following the intravenous administration of 7 ml of Vueway Three-dimensional MR images were rendered by post-processing of the original MR data on an independent workstation. The three-dimensional MR images were interpreted, and findings are reported in the following complete MRI report for this study. Three dimensional images were evaluated at the independent interpreting workstation using the DynaCAD thin client. COMPARISON:  Prior mammograms.  05/05/2020 and 10/04/2018 MRs. FINDINGS: Breast composition: c. Heterogeneous fibroglandular tissue. Background parenchymal enhancement: Mild Right breast: No suspicious mass or worrisome enhancement. Lumpectomy changes are noted. Left breast: A new 0.5 x 0.7 cm mass within the posterior LOWER INNER LEFT breast (image 99: Series 12) is identified. No other suspicious mass or worrisome enhancement noted. Biopsy clip artifact within the UPPER OUTER LEFT breast identified. Lymph nodes: No abnormal appearing lymph nodes. Ancillary findings:  None. IMPRESSION: 1. New indeterminate 0.7 cm LOWER INNER LEFT breast mass. Tissue sampling is recommended. 2. No abnormal appearing lymph nodes. 3. RIGHT lumpectomy changes. RECOMMENDATION: MR guided LEFT breast biopsy. BI-RADS CATEGORY  4: Suspicious. Electronically Signed   By: Margarette Canada M.D.   On: 09/12/2022 12:50    ELIGIBLE FOR AVAILABLE RESEARCH PROTOCOL: no   ASSESSMENT: 47 y.o. [place], [place] woman that is post right breast biopsy 09/17/2018 for a ductal carcinoma in situ, grade 3, estrogen and progesterone receptor negative  (a) biopsy of a suspicious area in the left breast 09/17/2018 was benign  (b) additional right breast biopsies obtained 10/14/2018, both from the lower outer quadrant, showed ductal carcinoma in situ, high to intermediate grade, 1 of which was estrogen and progesterone receptor negative, the other one strongly estrogen and progesterone receptor positive  (1) genetic testing on  09/24/2018 through [facility]'s Breast Cancer STAT  Panel + Common Hereditary Cancers Panel showed no pathogenic mutations. The STAT Breast cancer panel offered by [facility] includes sequencing and rearrangement analysis for the following 9 genes:  ATM, BRCA1, BRCA2, CDH1, CHEK2, PALB2, PTEN, STK11 and TP53. The Common Hereditary Cancers Panel offered by [facility] includes sequencing and/or deletion duplication testing of the following 47 genes: APC, ATM, AXIN2, BARD1, BMPR1A, BRCA1, BRCA2, BRIP1, CDH1, CDKN2A (p14ARF), CDKN2A (p16INK4a), CKD4, CHEK2, CTNNA1, DICER1, EPCAM (Deletion/duplication testing only), GREM1 (promoter region deletion/duplication testing only), KIT, MEN1, MLH1, MSH2, MSH3, MSH6, MUTYH, NBN, NF1, NHTL1, PALB2, PDGFRA, PMS2, POLD1, POLE, PTEN, RAD50, RAD51C, RAD51D, SDHB, SDHC, SDHD, SMAD4, SMARCA4. STK11, TP53, TSC1, TSC2, and VHL.  The following genes were evaluated for sequence changes only: SDHA and HOXB13 c.251G>A variant only.  (2) right lumpectomy 01/13/2019 showed ductal carcinoma in situ, grade 3, measuring 4 cm, with negative resection margins.  (3) adjuvant radiation 02/23/2019 - 04/14/2019  (a) right breast / 50 Gy in 25 fractions  (b) seroma boost / 14.4 Gy in 8 fractions  (4) started tamoxifen 05/07/2019   PLAN:  Patient is tolerating tamoxifen well.  She denies any new adverse effects. Physical examination unremarkable, no palpable masses or regional adenopathy We have reviewed her most recent MRI which is unremarkable.  We once again discussed about the unknown long-term risks of gadolinium deposition, there is some concern about cognitive decline with gadolinium however this has not been proven so far.  Next MRI ordered for Nov 2023. She was encouraged to do self breast exam and return to clinic sooner as needed or in 1 year.  Total encounter time 30 minutes.*  *Total Encounter Time as defined by the Centers for Medicare and Medicaid Services includes, in addition to  the face-to-face time of a patient visit (documented in the note above) non-face-to-face time: obtaining and reviewing outside history, ordering and reviewing medications, tests or procedures, care coordination (communications with other health care professionals or caregivers) and documentation in the medical record.

## 2022-09-21 NOTE — Telephone Encounter (Signed)
Patient aware of upcoming appointment

## 2022-09-24 ENCOUNTER — Ambulatory Visit
Admission: RE | Admit: 2022-09-24 | Discharge: 2022-09-24 | Disposition: A | Discharge status: Home / Self Care | Payer: 59 | Source: Ambulatory Visit | Attending: Hematology and Oncology | Admitting: Hematology and Oncology

## 2022-09-24 DIAGNOSIS — R9389 Abnormal findings on diagnostic imaging of other specified body structures: Secondary | ICD-10-CM

## 2022-09-24 MED ORDER — GADOPICLENOL 0.5 MMOL/ML IV SOLN: 7.0000 mL | Freq: Once | INTRAVENOUS | Status: DC | PRN

## 2022-09-25 ENCOUNTER — Other Ambulatory Visit: Payer: Self-pay | Admitting: Hematology and Oncology

## 2022-09-25 DIAGNOSIS — C50912 Malignant neoplasm of unspecified site of left female breast: Secondary | ICD-10-CM

## 2022-09-28 ENCOUNTER — Encounter: Payer: Self-pay | Admitting: Hematology and Oncology

## 2022-09-28 ENCOUNTER — Inpatient Hospital Stay: Payer: 59 | Attending: Hematology and Oncology | Admitting: Hematology and Oncology

## 2022-09-28 ENCOUNTER — Telehealth: Payer: Self-pay | Admitting: Hematology and Oncology

## 2022-09-28 DIAGNOSIS — C50912 Malignant neoplasm of unspecified site of left female breast: Secondary | ICD-10-CM

## 2022-09-28 NOTE — Progress Notes (Signed)
[place]  [phone]) [phone] Fax:([phone]) [phone]    ID: Loyce Atamian Deems DOB: [birth year]  MR#: [id number]  [id number]  Patient Care Team: Carollee Herter, Alferd Apa, DO as PCP - General Susa Day, MD (Orthopedic Surgery) Mauro Kaufmann, RN as Oncology Nurse Navigator Rockwell Germany, RN as Oncology Nurse Navigator Erroll Luna, MD as Consulting Physician (General Surgery) [name], [name], MD (Inactive) as Consulting Physician (Oncology) Kyung Rudd, MD as Consulting Physician (Radiation Oncology) Everlene Farrier, MD as Consulting Physician (Obstetrics and Gynecology) Wylene Simmer, MD as Consulting Physician (Orthopedic Surgery) OTHER MD:   CHIEF COMPLAINT: IDC  CURRENT TREATMENT: tamoxifen  INTERVAL HISTORY:  [name] returns today for telephone follow up since her MRI biopsy. She already heard about the pathology results.  She is scheduled to see breast surgery soon.  She had no questions today.  REVIEW OF SYSTEMS: Rest of the pertinent 10 point ROS reviewed and negative  HISTORY OF CURRENT ILLNESS: From the original intake note:  "[name]" [name] underwent bilateral diagnostic mammography with tomography at [facility] on 09/12/2018 showing: Breast Density Category C.   In the right breast, magnified views demonstrate a group of fine pleomorphic calcifications in the lower outer quadrant of the right breast spanning 2.8 x 1.7 x 5.0 cm. Calcifications are not typical for fat necrosis.   Accordingly on 09/17/2018 she proceeded to biopsy of the right breast area in question. The pathology from this procedure showed [id number]): ductal carcinoma in situ with calcifications, high grade. Prognostic indicators significant for: estrogen receptor, 0% negative and progesterone receptor, 0% negative.   In the left breast, magnified views of the left breast calcifications demonstrate a group of faint pleomorphic calcifications  in the upper-outer quadrant measuring approximately 0.8 cm in diameter. Calcifications are not typical for fat necrosis.   Accordingly, she underwent a biopsy of the right breast area in question on 09/17/2018. The pathology from this procedure showed [id number]): fibrocystic changes with adenosis and calcifications. There is no evidence of malignancy.    The patient's subsequent history is as detailed below.   PAST MEDICAL HISTORY: Past Medical History:  Diagnosis Date   Anemia    Back pain    Bimalleolar ankle fracture    left   Breast cancer ([place])    right breast- 2020   Family history of breast cancer    GERD (gastroesophageal reflux disease)    Hypertension    no meds   Kidney infection    1998- hosp.[facility]   Personal history of radiation therapy    Seizures ([place])    from Tamiflu, more than 10 years ago- no instances since    PAST SURGICAL HISTORY: Past Surgical History:  Procedure Laterality Date   ANTERIOR LUMBAR FUSION  11/14/2011   Procedure: ANTERIOR LUMBAR FUSION 2 LEVELS;  Surgeon: Johnn Hai, MD;  Location: [place];  Service: Orthopedics;  Laterality: N/A;  ALIF L5-S1   BACK SURGERY     BREAST LUMPECTOMY Right 01/2019   BREAST LUMPECTOMY WITH RADIOACTIVE SEED LOCALIZATION Right 01/13/2019   Procedure: RIGHT BREAST LUMPECTOMY WITH RADIOACTIVE SEED LOCALIZATION X3;  Surgeon: Erroll Luna, MD;  Location: [place];  Service: General;  Laterality: Right;   CESAREAN SECTION     2007- /w epidural  anesthesia    COLONOSCOPY     LUMBAR LAMINECTOMY/DECOMPRESSION MICRODISCECTOMY Right 12/15/2015   Procedure:  MICO LUMBER DECOMPRESSION L4-5 ON THE RIGHT, ;  Surgeon: Susa Day, MD;  Location: [facility];  Service: Orthopedics;  Laterality: Right;   ORIF ANKLE FRACTURE Left 10/03/2017   Procedure: OPEN REDUCTION INTERNAL FIXATION (ORIF) ANKLE BIMALLEOLAR FRACTURE; possible deltoid ligament repair;  Surgeon: Wylene Simmer, MD;  Location: [place];  Service: Orthopedics;  Laterality: Left;   SPINE SURGERY     fusion   UPPER GASTROINTESTINAL ENDOSCOPY     WISDOM TOOTH EXTRACTION     young adult    FAMILY HISTORY: Family History  Problem Relation Age of Onset   Lupus Mother    Breast cancer Maternal Aunt    Anesthesia problems Neg Hx    Colon cancer Neg Hx    Colon polyps Neg Hx    Esophageal cancer Neg Hx    Rectal cancer Neg Hx    Stomach cancer Neg Hx   [name]'s father and mother are both 61 as of 09/2018. The patient has 2 sisters. [name]'s paternal aunt was diagnosed with breast cancer at 69. Patient denies anyone in her family having breast, ovarian, prostate, or pancreatic cancer.    GYNECOLOGIC HISTORY:  No LMP recorded. (Menstrual status: IUD). 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 Their son, Latise Stolberg, is 57 years old as of December 2022. [name]'s father, [name], is a retired Consulting civil engineer.    ADVANCED DIRECTIVES: Her husband, BJ, is automatically her healthcare power of attorney.     HEALTH MAINTENANCE: Social History   Tobacco Use   Smoking status: Former    Packs/day: 0.25    Years: 15.00    Total pack years: 3.75    Types: E-cigarettes, Cigarettes    Quit date: 04/01/2017    Years since quitting: 5.4   Smokeless tobacco: Never   Tobacco comments:    vapes daily  Vaping Use   Vaping Use: Every day   Substances: Nicotine  Substance Use Topics   Alcohol use: Yes    Alcohol/week: 0.0 standard drinks of alcohol    Comment: a glass of wine  on occas.    Drug use: No    Colonoscopy: yes, 2005  PAP: 09/12/2018  Bone density: no   Allergies  Allergen Reactions   Tamiflu [Oseltamivir Phosphate] Other (See Comments)    Seizure    Ciprofloxacin Diarrhea and Nausea And Vomiting   Metoclopramide Hcl Other (See Comments)    "lock jaw"   Sulfa Antibiotics Nausea And Vomiting    Current Outpatient Medications  Medication Sig Dispense Refill   estradiol (ESTRACE VAGINAL) 0.1 MG/GM vaginal cream Place 1 Applicatorful vaginally at bedtime. (Patient not taking: Reported on 11/08/2021) 42.5 g 12   levonorgestrel (MIRENA) 20 MCG/24HR IUD 1 each by Intrauterine route once.     losartan (COZAAR) 25 MG tablet TAKE 1 TABLET BY MOUTH EVERY DAY 90 tablet 1   omeprazole (PRILOSEC) 40 MG capsule TAKE 1 CAPSULE BY MOUTH TWICE A DAY 180 capsule 0   tamoxifen (NOLVADEX) 20 MG tablet Take 1 tablet (20 mg total) by mouth daily. 90 tablet 4   traMADol (ULTRAM) 50 MG tablet Take 1 tablet (50 mg total) by mouth every 12 (twelve) hours as needed. 8 tablet 0   valACYclovir (VALTREX) 1000 MG tablet Take 1 tablet (1,000 mg total) by mouth  3 (three) times daily as needed. For flare ups 90 tablet 2   No current facility-administered medications for this visit.    OBJECTIVE: white woman in no acute distress  There were no vitals filed for this visit.     There is no height or weight on file to calculate BMI.   Wt Readings from Last 3 Encounters:  06/05/22 143 lb (64.9 kg)  11/22/21 145 lb (65.8 kg)  11/08/21 145 lb (65.8 kg)     ECOG FS:1 - Symptomatic but completely ambulatory  Physical examination not done, telephone visit  LAB RESULTS:  CMP     Component Value Date/Time   NA 137 04/05/2020 1011   K 3.6 04/05/2020 1011   CL 102 04/05/2020 1011   CO2 26 04/05/2020 1011   GLUCOSE 103 (H) 04/05/2020 1011   BUN 9 04/05/2020 1011   CREATININE 0.71 04/05/2020 1011   CREATININE 0.78 07/27/2019 1133   CALCIUM 9.7 04/05/2020 1011   PROT 7.9 04/05/2020 1011   ALBUMIN 4.2 04/05/2020 1011   AST 73 (H) 04/05/2020 1011   AST 89 (H) 07/27/2019 1133   ALT 41 04/05/2020 1011   ALT 56 (H) 07/27/2019 1133   ALKPHOS 38 04/05/2020 1011    BILITOT 0.4 04/05/2020 1011   BILITOT 0.6 07/27/2019 1133   GFRNONAA >60 04/05/2020 1011   GFRNONAA >60 07/27/2019 1133   GFRAA >60 04/05/2020 1011   GFRAA >60 07/27/2019 1133    No results found for: "TOTALPROTELP", "ALBUMINELP", "A1GS", "A2GS", "BETS", "BETA2SER", "GAMS", "MSPIKE", "SPEI"  No results found for: "KPAFRELGTCHN", "LAMBDASER", "KAPLAMBRATIO"  Lab Results  Component Value Date   WBC 3.0 (L) 04/05/2020   NEUTROABS 1.6 (L) 04/05/2020   HGB 12.9 04/05/2020   HCT 37.4 04/05/2020   MCV 99.5 04/05/2020   PLT 151 04/05/2020   No results found for: "LABCA2"  No components found for: "[id number]"  No results for input(s): "INR" in the last 168 hours.  No results found for: "LABCA2"  No results found for: "[id number]"  No results found for: "CAN125"  No results found for: "CAN153"  No results found for: "CA2729"  No components found for: "HGQUANT"  No results found for: "CEA1", "CEA" / No results found for: "CEA1", "CEA"   No results found for: "AFPTUMOR"  No results found for: "CHROMOGRNA"  No results found for: "TOTALPROTELP", "ALBUMINELP", "A1GS", "A2GS", "BETS", "BETA2SER", "GAMS", "MSPIKE", "SPEI" (this displays SPEP labs)  No results found for: "KPAFRELGTCHN", "LAMBDASER", "KAPLAMBRATIO" (kappa/lambda light chains)  No results found for: "HGBA", "HGBA2QUANT", "HGBFQUANT", "HGBSQUAN" (Hemoglobinopathy evaluation)   No results found for: "LDH"  No results found for: "IRON", "TIBC", "IRONPCTSAT" (Iron and TIBC)  No results found for: "FERRITIN"  Urinalysis    Component Value Date/Time   COLORURINE YELLOW 11/08/2011 0912   APPEARANCEUR CLEAR 11/08/2011 0912   LABSPEC 1.023 11/08/2011 0912   PHURINE 5.0 11/08/2011 0912   GLUCOSEU NEGATIVE 11/08/2011 0912   GLUCOSEU NEG mg/dL 09/25/2006 2234   HGBUR NEGATIVE 11/08/2011 0912   HGBUR large 06/01/2009 1449   BILIRUBINUR small (A) 03/20/2019 1142   BILIRUBINUR neg 01/20/2018 1751   KETONESUR  negative 03/20/2019 1142   KETONESUR NEGATIVE 11/08/2011 0912   PROTEINUR =30 (A) 03/20/2019 1142   PROTEINUR Positive (A) 01/20/2018 1751   PROTEINUR NEGATIVE 11/08/2011 0912   UROBILINOGEN 2.0 (A) 03/20/2019 1142   UROBILINOGEN 0.2 11/08/2011 0912   NITRITE Positive (A) 03/20/2019 1142   NITRITE neg 01/20/2018 1751   NITRITE NEGATIVE 11/08/2011 0912   LEUKOCYTESUR  Large (3+) (A) 03/20/2019 1142    STUDIES:  MR LT BREAST BX W LOC DEV 1ST LESION IMAGE BX SPEC MR GUIDE  Addendum Date: 09/26/2022   ADDENDUM REPORT: 09/26/2022 09:56 ADDENDUM: Pathology revealed 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 This was found to be concordant by Dr. Lillia Mountain. Pathology results were discussed with the patient by telephone. The patient reported doing well after the biopsy with tenderness at the site. Post biopsy instructions and care were reviewed and questions were answered. The patient was encouraged to call [facility] for any additional concerns. My direct phone number was provided. Medical oncology consultation has been arranged with Dr. Benay Pike at [facility] on September 28, 2022. Surgical consultation has been arranged with Dr. Erroll Luna at [facility] on October 01, 2022. The patient is scheduled for BILATERAL diagnostic mammography on October 04, 2022. Pathology results reported by Terie Purser, RN on 09/26/2022. Electronically Signed   By: Lillia Mountain M.D.   On: 09/26/2022 09:56   Result Date: 09/26/2022 CLINICAL DATA:  Left breast mass. EXAM: MRI GUIDED CORE NEEDLE BIOPSY OF THE LEFT BREAST TECHNIQUE: Multiplanar, multisequence MR imaging of the left breast was performed both before and after administration of intravenous contrast. CONTRAST:  7 cc of Vueway COMPARISON:  Previous exam(s). FINDINGS: I met with the patient, and we discussed the procedure of MRI guided biopsy, including  risks, benefits, and alternatives. Specifically, we discussed the risks of infection, bleeding, tissue injury, clip migration, and inadequate sampling. Informed, written consent was given. The usual time out protocol was performed immediately prior to the procedure. Using sterile technique, 1% Lidocaine, MRI guidance, and a 9 gauge vacuum assisted device, biopsy was performed of a mass in the lower-inner quadrant of the left breast using a medial to lateral approach. At the conclusion of the procedure, a cylinder shaped tissue marker clip was deployed into the biopsy cavity. Follow-up 2-view mammogram was performed and dictated separately. IMPRESSION: MRI guided biopsy of the left breast.  No apparent complications. Electronically Signed: By: Lillia Mountain M.D. On: 09/24/2022 09:52   MM CLIP PLACEMENT LEFT  Result Date: 09/24/2022 CLINICAL DATA:  Status post MR guided core biopsy of the left breast EXAM: 3D DIAGNOSTIC LEFT MAMMOGRAM POST MRI BIOPSY COMPARISON:  Previous exam(s). FINDINGS: 3D Mammographic images were obtained following MRI guided biopsy of the left breast. The biopsy marking clip is in expected position at the site of biopsy. There is a cylinder shaped clip in the lower-inner quadrant of the left breast in appropriate position. IMPRESSION: Appropriate positioning of the cylinder shaped biopsy marking clip at the site of biopsy in the lower-inner quadrant. Final Assessment: Post Procedure Mammograms for Marker Placement Electronically Signed   By: Lillia Mountain M.D.   On: 09/24/2022 09:53  MR BREAST BILATERAL W WO CONTRAST INC CAD  Result Date: 09/12/2022 CLINICAL DATA:  47 year old female for high lifetime risk for developing breast cancer. Personal history of RIGHT breast cancer, RIGHT lumpectomy and radiation in 2020. EXAM: BILATERAL BREAST MRI WITH AND WITHOUT CONTRAST TECHNIQUE: Multiplanar, multisequence MR images of both breasts were obtained prior to and following the intravenous  administration of 7 ml of Vueway Three-dimensional MR images were rendered by post-processing of the original MR data on an independent workstation. The three-dimensional MR images were interpreted, and findings are reported in the following complete MRI report for this study. Three dimensional images were evaluated  at the independent interpreting workstation using the DynaCAD thin client. COMPARISON:  Prior mammograms.  05/05/2020 and 10/04/2018 MRs. FINDINGS: Breast composition: c. Heterogeneous fibroglandular tissue. Background parenchymal enhancement: Mild Right breast: No suspicious mass or worrisome enhancement. Lumpectomy changes are noted. Left breast: A new 0.5 x 0.7 cm mass within the posterior LOWER INNER LEFT breast (image 99: Series 12) is identified. No other suspicious mass or worrisome enhancement noted. Biopsy clip artifact within the UPPER OUTER LEFT breast identified. Lymph nodes: No abnormal appearing lymph nodes. Ancillary findings:  None. IMPRESSION: 1. New indeterminate 0.7 cm LOWER INNER LEFT breast mass. Tissue sampling is recommended. 2. No abnormal appearing lymph nodes. 3. RIGHT lumpectomy changes. RECOMMENDATION: MR guided LEFT breast biopsy. BI-RADS CATEGORY  4: Suspicious. Electronically Signed   By: Margarette Canada M.D.   On: 09/12/2022 12:50    ELIGIBLE FOR AVAILABLE RESEARCH PROTOCOL: no   ASSESSMENT: 47 y.o. [place], [place] woman that is post right breast biopsy 09/17/2018 for a ductal carcinoma in situ, grade 3, estrogen and progesterone receptor negative  (a) biopsy of a suspicious area in the left breast 09/17/2018 was benign  (b) additional right breast biopsies obtained 10/14/2018, both from the lower outer quadrant, showed ductal carcinoma in situ, high to intermediate grade, 1 of which was estrogen and progesterone receptor negative, the other one strongly estrogen and progesterone receptor positive  (1) genetic testing on 09/24/2018 through [facility]'s Breast Cancer STAT  Panel + Common Hereditary Cancers Panel showed no pathogenic mutations. The STAT Breast cancer panel offered by [facility] includes sequencing and rearrangement analysis for the following 9 genes:  ATM, BRCA1, BRCA2, CDH1, CHEK2, PALB2, PTEN, STK11 and TP53. The Common Hereditary Cancers Panel offered by [facility] includes sequencing and/or deletion duplication testing of the following 47 genes: APC, ATM, AXIN2, BARD1, BMPR1A, BRCA1, BRCA2, BRIP1, CDH1, CDKN2A (p14ARF), CDKN2A (p16INK4a), CKD4, CHEK2, CTNNA1, DICER1, EPCAM (Deletion/duplication testing only), GREM1 (promoter region deletion/duplication testing only), KIT, MEN1, MLH1, MSH2, MSH3, MSH6, MUTYH, NBN, NF1, NHTL1, PALB2, PDGFRA, PMS2, POLD1, POLE, PTEN, RAD50, RAD51C, RAD51D, SDHB, SDHC, SDHD, SMAD4, SMARCA4. STK11, TP53, TSC1, TSC2, and VHL.  The following genes were evaluated for sequence changes only: SDHA and HOXB13 c.251G>A variant only.  (2) right lumpectomy 01/13/2019 showed ductal carcinoma in situ, grade 3, measuring 4 cm, with negative resection margins.  (3) adjuvant radiation 02/23/2019 - 04/14/2019  (a) right breast / 50 Gy in 25 fractions  (b) seroma boost / 14.4 Gy in 8 fractions  (4) started tamoxifen 05/07/2019   PLAN:  We have discussed about the left breast needle core biopsy results which showed a grade 1 invasive ductal carcinoma, ER/PR positive HER2 2+ by IHC FISH pending.  She is already scheduled to see the breast surgeon according to our conversation.  I have encouraged her to return to clinic after surgery.   If she has a final tumor measuring 5 mm or greater then we will proceed with Oncotype testing. RTC in 4 weeks.  Total encounter time: 12 min.  *Total Encounter Time as defined by the Centers for Medicare and Medicaid Services includes, in addition to the face-to-face time of a patient visit (documented in the note above) non-face-to-face time: obtaining and reviewing outside history, ordering and reviewing  medications, tests or procedures, care coordination (communications with other health care professionals or caregivers) and documentation in the medical record.

## 2022-09-28 NOTE — Telephone Encounter (Signed)
Spoke with patient confirming upcoming appointment  

## 2022-10-04 ENCOUNTER — Ambulatory Visit
Admission: RE | Admit: 2022-10-04 | Discharge: 2022-10-04 | Disposition: A | Discharge status: Home / Self Care | Payer: 59 | Source: Ambulatory Visit | Attending: Hematology and Oncology | Admitting: Hematology and Oncology

## 2022-10-04 ENCOUNTER — Ambulatory Visit: Payer: 59

## 2022-10-04 ENCOUNTER — Other Ambulatory Visit: Payer: Self-pay | Admitting: Hematology and Oncology

## 2022-10-04 DIAGNOSIS — C50912 Malignant neoplasm of unspecified site of left female breast: Secondary | ICD-10-CM

## 2022-10-15 ENCOUNTER — Inpatient Hospital Stay: Payer: 59 | Attending: Hematology and Oncology | Admitting: Hematology and Oncology

## 2022-10-15 ENCOUNTER — Other Ambulatory Visit: Payer: Self-pay

## 2022-10-15 VITALS — BP 135/91 | HR 113 | Temp 98.1°F | Resp 18 | Ht 63.0 in | Wt 140.1 lb

## 2022-10-15 DIAGNOSIS — Z170 Estrogen receptor positive status [ER+]: Secondary | ICD-10-CM | POA: Diagnosis not present

## 2022-10-15 DIAGNOSIS — Z79810 Long term (current) use of selective estrogen receptor modulators (SERMs): Secondary | ICD-10-CM | POA: Diagnosis not present

## 2022-10-15 DIAGNOSIS — Z5111 Encounter for antineoplastic chemotherapy: Secondary | ICD-10-CM | POA: Diagnosis present

## 2022-10-15 DIAGNOSIS — C50312 Malignant neoplasm of lower-inner quadrant of left female breast: Secondary | ICD-10-CM | POA: Insufficient documentation

## 2022-10-15 DIAGNOSIS — C50912 Malignant neoplasm of unspecified site of left female breast: Secondary | ICD-10-CM | POA: Diagnosis not present

## 2022-10-15 NOTE — Progress Notes (Signed)
[place]  [phone] Fax:(336) [phone]    ID: [name] DOB: 1976-03-28  MR#: [id number]  [id number]  Patient Care Team: Carollee Herter, Alferd Apa, DO as PCP - General Susa Day, MD (Orthopedic Surgery) Mauro Kaufmann, RN as Oncology Nurse Navigator Rockwell Germany, RN as Oncology Nurse Navigator Erroll Luna, MD as Consulting Physician (General Surgery) [name], [name], MD (Inactive) as Consulting Physician (Oncology) Kyung Rudd, MD as Consulting Physician (Radiation Oncology) Everlene Farrier, MD as Consulting Physician (Obstetrics and Gynecology) Wylene Simmer, MD as Consulting Physician (Orthopedic Surgery) OTHER MD:   CHIEF COMPLAINT: IDC  CURRENT TREATMENT: tamoxifen  INTERVAL HISTORY: She had several questions regarding surgical options, adjuvant antiestrogen therapy hence she made this appointment.  She arrived today with her dad who is a retired family physician.  REVIEW OF SYSTEMS: Rest of the pertinent 10 point ROS reviewed and negative  HISTORY OF CURRENT ILLNESS: From the original intake note:  "[name]" [name] underwent bilateral diagnostic mammography with tomography at [facility] on 09/12/2018 showing: Breast Density Category C.   In the right breast, magnified views demonstrate a group of fine pleomorphic calcifications in the lower outer quadrant of the right breast spanning 2.8 x 1.7 x 5.0 cm. Calcifications are not typical for fat necrosis.   Accordingly on 09/17/2018 she proceeded to biopsy of the right breast area in question. The pathology from this procedure showed [id number]): ductal carcinoma in situ with calcifications, high grade. Prognostic indicators significant for: estrogen receptor, 0% negative and progesterone receptor, 0% negative.   In the left breast, magnified views of the left breast calcifications demonstrate a group of faint pleomorphic calcifications in the  upper-outer quadrant measuring approximately 0.8 cm in diameter. Calcifications are not typical for fat necrosis.   Accordingly, she underwent a biopsy of the right breast area in question on 09/17/2018. The pathology from this procedure showed [id number]): fibrocystic changes with adenosis and calcifications. There is no evidence of malignancy.    The patient's subsequent history is as detailed below.   PAST MEDICAL HISTORY: Past Medical History:  Diagnosis Date   Anemia    Back pain    Bimalleolar ankle fracture    left   Breast cancer ([place])    right breast- 2020   Family history of breast cancer    GERD (gastroesophageal reflux disease)    Hypertension    no meds   Kidney infection    1998- [facility]   Personal history of radiation therapy    Seizures ([place])    from Tamiflu, more than 10 years ago- no instances since    PAST SURGICAL HISTORY: Past Surgical History:  Procedure Laterality Date   ANTERIOR LUMBAR FUSION  11/14/2011   Procedure: ANTERIOR LUMBAR FUSION 2 LEVELS;  Surgeon: Johnn Hai, MD;  Location: [facility];  Service: Orthopedics;  Laterality: N/A;  ALIF L5-S1   BACK SURGERY     BREAST LUMPECTOMY Right 01/2019   BREAST LUMPECTOMY WITH RADIOACTIVE SEED LOCALIZATION Right 01/13/2019   Procedure: RIGHT BREAST LUMPECTOMY WITH RADIOACTIVE SEED LOCALIZATION X3;  Surgeon: Erroll Luna, MD;  Location: [facility];  Service: General;  Laterality: Right;   CESAREAN SECTION     2007- /w epidural  anesthesia    COLONOSCOPY     LUMBAR LAMINECTOMY/DECOMPRESSION MICRODISCECTOMY Right 12/15/2015   Procedure:  MICO LUMBER DECOMPRESSION L4-5 ON THE RIGHT, ;  Surgeon: Susa Day, MD;  Location: [facility];  Service: Orthopedics;  Laterality:  Right;   ORIF ANKLE FRACTURE Left 10/03/2017   Procedure: OPEN REDUCTION INTERNAL FIXATION (ORIF) ANKLE BIMALLEOLAR FRACTURE; possible deltoid ligament repair;  Surgeon: Wylene Simmer, MD;  Location: [facility];   Service: Orthopedics;  Laterality: Left;   SPINE SURGERY     fusion   UPPER GASTROINTESTINAL ENDOSCOPY     WISDOM TOOTH EXTRACTION     young adult    FAMILY HISTORY: Family History  Problem Relation Age of Onset   Lupus Mother    Breast cancer Maternal Aunt    Anesthesia problems Neg Hx    Colon cancer Neg Hx    Colon polyps Neg Hx    Esophageal cancer Neg Hx    Rectal cancer Neg Hx    Stomach cancer Neg Hx   [name]'s father and mother are both 15 as of 09/2018. The patient has 2 sisters. [name]'s paternal aunt was diagnosed with breast cancer at 74. Patient denies anyone in her family having breast, ovarian, prostate, or pancreatic cancer.    GYNECOLOGIC HISTORY:  No LMP recorded. (Menstrual status: IUD). 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 Their son, [name], is 39 years old as of December 2022. [name]'s father, [name], is a retired Consulting civil engineer.    ADVANCED DIRECTIVES: Her husband, BJ, is automatically her healthcare power of attorney.     HEALTH MAINTENANCE: Social History   Tobacco Use   Smoking status: Former    Packs/day: 0.25    Years: 15.00    Total pack years: 3.75    Types: E-cigarettes, Cigarettes    Quit date: 04/01/2017    Years since quitting: 5.5   Smokeless tobacco: Never   Tobacco comments:    vapes daily  Vaping Use   Vaping Use: Every day   Substances: Nicotine  Substance Use Topics   Alcohol use: Yes    Alcohol/week: 0.0 standard drinks of alcohol    Comment: a glass of wine  on occas.    Drug use: No    Colonoscopy: yes, 2005  PAP: 09/12/2018  Bone density: no   Allergies  Allergen Reactions   Tamiflu [Oseltamivir Phosphate] Other (See Comments)    Seizure    Ciprofloxacin Diarrhea and Nausea And Vomiting   Metoclopramide Hcl Other (See Comments)    "lock jaw"   Sulfa Antibiotics Nausea And Vomiting    Current Outpatient Medications  Medication Sig Dispense Refill   estradiol (ESTRACE VAGINAL) 0.1 MG/GM vaginal cream Place 1 Applicatorful vaginally at bedtime. (Patient not taking: Reported on 11/08/2021) 42.5 g 12   levonorgestrel (MIRENA) 20 MCG/24HR IUD 1 each by Intrauterine route once.     losartan (COZAAR) 25 MG tablet TAKE 1 TABLET BY MOUTH EVERY DAY 90 tablet 1   omeprazole (PRILOSEC) 40 MG capsule TAKE 1 CAPSULE BY MOUTH TWICE A DAY 180 capsule 0   tamoxifen (NOLVADEX) 20 MG tablet Take 1 tablet (20 mg total) by mouth daily. 90 tablet 4   traMADol (ULTRAM) 50 MG tablet Take 1 tablet (50 mg total) by mouth every 12 (twelve) hours as needed. 8 tablet 0   valACYclovir (VALTREX) 1000 MG tablet Take 1 tablet (1,000 mg total) by mouth 3 (three) times daily as needed.  For flare ups 90 tablet 2   No current facility-administered medications for this visit.    OBJECTIVE: white woman in no acute distress  Vitals:   10/15/22 1250  BP: (!) 135/91  Pulse: (!) 113  Resp: 18  Temp: 98.1 F (36.7 C)  SpO2: 95%      Body mass index is 24.82 kg/m.   Wt Readings from Last 3 Encounters:  10/15/22 140 lb 1.6 oz (63.5 kg)  06/05/22 143 lb (64.9 kg)  11/22/21 145 lb (65.8 kg)     ECOG FS:1 - Symptomatic but completely ambulatory  Physical examination not done, deferred in lieu of counseling LAB RESULTS:  CMP     Component Value Date/Time   NA 137 04/05/2020 1011   K 3.6 04/05/2020 1011   CL 102 04/05/2020 1011   CO2 26 04/05/2020 1011   GLUCOSE 103 (H) 04/05/2020 1011   BUN 9 04/05/2020 1011   CREATININE 0.71 04/05/2020 1011   CREATININE 0.78 07/27/2019 1133   CALCIUM 9.7 04/05/2020 1011   PROT 7.9 04/05/2020 1011   ALBUMIN 4.2 04/05/2020 1011   AST 73 (H) 04/05/2020 1011   AST 89 (H) 07/27/2019 1133   ALT 41 04/05/2020 1011    ALT 56 (H) 07/27/2019 1133   ALKPHOS 38 04/05/2020 1011   BILITOT 0.4 04/05/2020 1011   BILITOT 0.6 07/27/2019 1133   GFRNONAA >60 04/05/2020 1011   GFRNONAA >60 07/27/2019 1133   GFRAA >60 04/05/2020 1011   GFRAA >60 07/27/2019 1133    No results found for: "TOTALPROTELP", "ALBUMINELP", "A1GS", "A2GS", "BETS", "BETA2SER", "GAMS", "MSPIKE", "SPEI"  No results found for: "KPAFRELGTCHN", "LAMBDASER", "KAPLAMBRATIO"  Lab Results  Component Value Date   WBC 3.0 (L) 04/05/2020   NEUTROABS 1.6 (L) 04/05/2020   HGB 12.9 04/05/2020   HCT 37.4 04/05/2020   MCV 99.5 04/05/2020   PLT 151 04/05/2020   No results found for: "LABCA2"  No components found for: "[id number]"  No results for input(s): "INR" in the last 168 hours.  No results found for: "LABCA2"  No results found for: "[id number]"  No results found for: "CAN125"  No results found for: "CAN153"  No results found for: "CA2729"  No components found for: "HGQUANT"  No results found for: "CEA1", "CEA" / No results found for: "CEA1", "CEA"   No results found for: "AFPTUMOR"  No results found for: "CHROMOGRNA"  No results found for: "TOTALPROTELP", "ALBUMINELP", "A1GS", "A2GS", "BETS", "BETA2SER", "GAMS", "MSPIKE", "SPEI" (this displays SPEP labs)  No results found for: "KPAFRELGTCHN", "LAMBDASER", "KAPLAMBRATIO" (kappa/lambda light chains)  No results found for: "HGBA", "HGBA2QUANT", "HGBFQUANT", "HGBSQUAN" (Hemoglobinopathy evaluation)   No results found for: "LDH"  No results found for: "IRON", "TIBC", "IRONPCTSAT" (Iron and TIBC)  No results found for: "FERRITIN"  Urinalysis    Component Value Date/Time   COLORURINE YELLOW 11/08/2011 0912   APPEARANCEUR CLEAR 11/08/2011 0912   LABSPEC 1.023 11/08/2011 0912   PHURINE 5.0 11/08/2011 0912   GLUCOSEU NEGATIVE 11/08/2011 0912   GLUCOSEU NEG mg/dL 09/25/2006 2234   HGBUR NEGATIVE 11/08/2011 0912   HGBUR large 06/01/2009 1449   BILIRUBINUR small (A) 03/20/2019  1142   BILIRUBINUR neg 01/20/2018 1751   KETONESUR negative 03/20/2019 1142   KETONESUR NEGATIVE 11/08/2011 0912   PROTEINUR =30 (A) 03/20/2019 1142   PROTEINUR Positive (A) 01/20/2018 1751   PROTEINUR NEGATIVE 11/08/2011 0912   UROBILINOGEN 2.0 (A) 03/20/2019 1142   UROBILINOGEN 0.2 11/08/2011 0912   NITRITE Positive (A) 03/20/2019 1142   NITRITE neg  01/20/2018 1751   NITRITE NEGATIVE 11/08/2011 0912   LEUKOCYTESUR Large (3+) (A) 03/20/2019 1142    STUDIES:  MM DIAG BREAST TOMO BILATERAL  Result Date: 10/04/2022 CLINICAL DATA:  47 year old female with recently diagnosed LEFT breast cancer by MR biopsy (CYLINDER clip). Evaluate both breasts mammographically. History of RIGHT breast cancer and lumpectomy in 2020. EXAM: DIGITAL DIAGNOSTIC BILATERAL MAMMOGRAM WITH TOMOSYNTHESIS; [place] AXILLARY LEFT TECHNIQUE: Bilateral digital diagnostic mammography and breast tomosynthesis was performed.; Targeted ultrasound examination of the left axilla was performed. COMPARISON:  Previous exam(s). ACR Breast Density Category c: The breasts are heterogeneously dense, which may obscure small masses. FINDINGS: Full field views of both breasts demonstrate a CYLINDER clip within the LOWER INNER LEFT breast at the site of recently biopsied malignancy. RIGHT breast lumpectomy changes and COIL biopsy clip within the UPPER OUTER LEFT breast again identified. No other new or suspicious mammographic findings are noted within either breast. Targeted ultrasound is performed, showing no abnormal appearing LEFT axillary lymph nodes. IMPRESSION: 1. CYLINDER biopsy clip within the LOWER INNER LEFT breast at the site of recently biopsy-proven malignancy. No other new or suspicious mammographic findings within either breast. 2. No abnormal appearing LEFT axillary lymph nodes. RECOMMENDATION: Treatment plan I have discussed the findings and recommendations with the patient. If applicable, a reminder letter will be sent to the patient  regarding the next appointment. BI-RADS CATEGORY  6: Known biopsy-proven malignancy. Electronically Signed   By: Margarette Canada M.D.   On: 10/04/2022 08:59  [place] AXILLA LEFT  Result Date: 10/04/2022 CLINICAL DATA:  47 year old female with recently diagnosed LEFT breast cancer by MR biopsy (CYLINDER clip). Evaluate both breasts mammographically. History of RIGHT breast cancer and lumpectomy in 2020. EXAM: DIGITAL DIAGNOSTIC BILATERAL MAMMOGRAM WITH TOMOSYNTHESIS; [place] AXILLARY LEFT TECHNIQUE: Bilateral digital diagnostic mammography and breast tomosynthesis was performed.; Targeted ultrasound examination of the left axilla was performed. COMPARISON:  Previous exam(s). ACR Breast Density Category c: The breasts are heterogeneously dense, which may obscure small masses. FINDINGS: Full field views of both breasts demonstrate a CYLINDER clip within the LOWER INNER LEFT breast at the site of recently biopsied malignancy. RIGHT breast lumpectomy changes and COIL biopsy clip within the UPPER OUTER LEFT breast again identified. No other new or suspicious mammographic findings are noted within either breast. Targeted ultrasound is performed, showing no abnormal appearing LEFT axillary lymph nodes. IMPRESSION: 1. CYLINDER biopsy clip within the LOWER INNER LEFT breast at the site of recently biopsy-proven malignancy. No other new or suspicious mammographic findings within either breast. 2. No abnormal appearing LEFT axillary lymph nodes. RECOMMENDATION: Treatment plan I have discussed the findings and recommendations with the patient. If applicable, a reminder letter will be sent to the patient regarding the next appointment. BI-RADS CATEGORY  6: Known biopsy-proven malignancy. Electronically Signed   By: Margarette Canada M.D.   On: 10/04/2022 08:59  MR LT BREAST BX W LOC DEV 1ST LESION IMAGE BX SPEC MR GUIDE  Addendum Date: 09/26/2022   ADDENDUM REPORT: 09/26/2022 09:56 ADDENDUM: Pathology revealed GRADE I INVASIVE DUCTAL  CARCINOMA of the LEFT breast, lower-inner quadrant, (cylinder clip). This was found to be concordant by Dr. Lillia Mountain. Pathology results were discussed with the patient by telephone. The patient reported doing well after the biopsy with tenderness at the site. Post biopsy instructions and care were reviewed and questions were answered. The patient was encouraged to call [facility] for any additional concerns. My direct phone number was provided. Medical oncology  consultation has been arranged with Dr. Benay Pike at [facility] on September 28, 2022. Surgical consultation has been arranged with Dr. Erroll Luna at [facility] on October 01, 2022. The patient is scheduled for BILATERAL diagnostic mammography on October 04, 2022. Pathology results reported by Terie Purser, RN on 09/26/2022. Electronically Signed   By: Lillia Mountain M.D.   On: 09/26/2022 09:56   Result Date: 09/26/2022 CLINICAL DATA:  Left breast mass. EXAM: MRI GUIDED CORE NEEDLE BIOPSY OF THE LEFT BREAST TECHNIQUE: Multiplanar, multisequence MR imaging of the left breast was performed both before and after administration of intravenous contrast. CONTRAST:  7 cc of Vueway COMPARISON:  Previous exam(s). FINDINGS: I met with the patient, and we discussed the procedure of MRI guided biopsy, including risks, benefits, and alternatives. Specifically, we discussed the risks of infection, bleeding, tissue injury, clip migration, and inadequate sampling. Informed, written consent was given. The usual time out protocol was performed immediately prior to the procedure. Using sterile technique, 1% Lidocaine, MRI guidance, and a 9 gauge vacuum assisted device, biopsy was performed of a mass in the lower-inner quadrant of the left breast using a medial to lateral approach. At the conclusion of the procedure, a cylinder shaped tissue marker clip was deployed into the biopsy cavity. Follow-up 2-view  mammogram was performed and dictated separately. IMPRESSION: MRI guided biopsy of the left breast.  No apparent complications. Electronically Signed: By: Lillia Mountain M.D. On: 09/24/2022 09:52  MM CLIP PLACEMENT LEFT  Result Date: 09/24/2022 CLINICAL DATA:  Status post MR guided core biopsy of the left breast EXAM: 3D DIAGNOSTIC LEFT MAMMOGRAM POST MRI BIOPSY COMPARISON:  Previous exam(s). FINDINGS: 3D Mammographic images were obtained following MRI guided biopsy of the left breast. The biopsy marking clip is in expected position at the site of biopsy. There is a cylinder shaped clip in the lower-inner quadrant of the left breast in appropriate position. IMPRESSION: Appropriate positioning of the cylinder shaped biopsy marking clip at the site of biopsy in the lower-inner quadrant. Final Assessment: Post Procedure Mammograms for Marker Placement Electronically Signed   By: Lillia Mountain M.D.   On: 09/24/2022 09:53    ELIGIBLE FOR AVAILABLE RESEARCH PROTOCOL: no   ASSESSMENT: 47 y.o. [place], [place] woman that is post right breast biopsy 09/17/2018 for a ductal carcinoma in situ, grade 3, estrogen and progesterone receptor negative  (a) biopsy of a suspicious area in the left breast 09/17/2018 was benign  (b) additional right breast biopsies obtained 10/14/2018, both from the lower outer quadrant, showed ductal carcinoma in situ, high to intermediate grade, 1 of which was estrogen and progesterone receptor negative, the other one strongly estrogen and progesterone receptor positive  (1) genetic testing on 09/24/2018 through [facility]'s Breast Cancer STAT Panel + Common Hereditary Cancers Panel showed no pathogenic mutations. The STAT Breast cancer panel offered by [facility] includes sequencing and rearrangement analysis for the following 9 genes:  ATM, BRCA1, BRCA2, CDH1, CHEK2, PALB2, PTEN, STK11 and TP53. The Common Hereditary Cancers Panel offered by [facility] includes sequencing and/or deletion duplication  testing of the following 47 genes: APC, ATM, AXIN2, BARD1, BMPR1A, BRCA1, BRCA2, BRIP1, CDH1, CDKN2A (p14ARF), CDKN2A (p16INK4a), CKD4, CHEK2, CTNNA1, DICER1, EPCAM (Deletion/duplication testing only), GREM1 (promoter region deletion/duplication testing only), KIT, MEN1, MLH1, MSH2, MSH3, MSH6, MUTYH, NBN, NF1, NHTL1, PALB2, PDGFRA, PMS2, POLD1, POLE, PTEN, RAD50, RAD51C, RAD51D, SDHB, SDHC, SDHD, SMAD4, SMARCA4. STK11, TP53, TSC1, TSC2, and VHL.  The following genes were evaluated for sequence  changes only: SDHA and HOXB13 c.251G>A variant only.  (2) right lumpectomy 01/13/2019 showed ductal carcinoma in situ, grade 3, measuring 4 cm, with negative resection margins.  (3) adjuvant radiation 02/23/2019 - 04/14/2019  (a) right breast / 50 Gy in 25 fractions  (b) seroma boost / 14.4 Gy in 8 fractions  (4) started tamoxifen 05/07/2019  (5) She recent had left breast needle biopsy. Pathology showed a grade 1 invasive ductal carcinoma, ER/PR positive HER2 2+ by IHC FISH negative.  She is already scheduled to see the breast surgeon according to our conversation.  I have encouraged her to return to clinic after surgery.   If she has a final tumor measuring 5 mm or greater then we will proceed with Oncotype testing.   She arrived today to the appointment with her dad who is a retired family physician. Several great questions today about the management.  We have discussed about considering lumpectomy with radiation versus mastectomy as recommended by Dr. Brantley Stage.  In terms of long-term survival data, there is no difference between lumpectomy with radiation versus mastectomy.  Now with regards to antiestrogen therapy she will be a candidate for ovarian suppression with aromatase inhibitor since she is currently on tamoxifen at the time of new cancer growth.  We have also discussed about considering Oncotype testing if her final tumor is greater than 5 mm.  If she has oncotype score of 16-25, then she can be  candidate for OFSET trial. FU with me in about 2/3 weeks after surgery.  Total encounter time: 30 min  *Total Encounter Time as defined by the Centers for Medicare and Medicaid Services includes, in addition to the face-to-face time of a patient visit (documented in the note above) non-face-to-face time: obtaining and reviewing outside history, ordering and reviewing medications, tests or procedures, care coordination (communications with other health care professionals or caregivers) and documentation in the medical record.

## 2022-10-17 ENCOUNTER — Telehealth: Payer: Self-pay | Admitting: Hematology and Oncology

## 2022-10-17 ENCOUNTER — Ambulatory Visit: Payer: Self-pay | Admitting: Surgery

## 2022-10-17 DIAGNOSIS — C50912 Malignant neoplasm of unspecified site of left female breast: Secondary | ICD-10-CM

## 2022-10-17 NOTE — Telephone Encounter (Signed)
Spoke with patient confirming upcoming appointments  

## 2022-10-22 ENCOUNTER — Encounter: Payer: Self-pay | Admitting: *Deleted

## 2022-10-23 ENCOUNTER — Encounter: Payer: Self-pay | Admitting: *Deleted

## 2022-10-23 ENCOUNTER — Telehealth: Payer: Self-pay | Admitting: Hematology and Oncology

## 2022-10-23 NOTE — Telephone Encounter (Signed)
Spoke with patient confirming all upcoming appointments  

## 2022-10-24 ENCOUNTER — Other Ambulatory Visit: Payer: Self-pay | Admitting: Surgery

## 2022-10-24 ENCOUNTER — Other Ambulatory Visit: Payer: Self-pay

## 2022-10-24 ENCOUNTER — Ambulatory Visit: Payer: 59 | Admitting: Hematology and Oncology

## 2022-10-24 ENCOUNTER — Inpatient Hospital Stay: Payer: 59

## 2022-10-24 ENCOUNTER — Encounter: Payer: Self-pay | Admitting: *Deleted

## 2022-10-24 VITALS — BP 113/84 | HR 101 | Temp 99.7°F | Resp 16

## 2022-10-24 DIAGNOSIS — Z5111 Encounter for antineoplastic chemotherapy: Secondary | ICD-10-CM | POA: Diagnosis not present

## 2022-10-24 DIAGNOSIS — C50912 Malignant neoplasm of unspecified site of left female breast: Secondary | ICD-10-CM

## 2022-10-24 MED ORDER — GOSERELIN ACETATE 3.6 MG ~~LOC~~ IMPL
3.6000 mg | DRUG_IMPLANT | Freq: Once | SUBCUTANEOUS | Status: AC
  Administered 2022-10-24: 3.6 mg via SUBCUTANEOUS
  Filled 2022-10-24: qty 3.6

## 2022-10-24 NOTE — Patient Instructions (Signed)
Goserelin Implant What is this medication? GOSERELIN (GOE se rel in) treats prostate cancer and breast cancer. It works by decreasing levels of the hormones testosterone and estrogen in the body. This prevents prostate and breast cancer cells from spreading or growing. It may also be used to treat endometriosis. This is a condition where the tissue that lines the uterus grows outside the uterus. It works by decreasing the amount of estrogen your body makes, which reduces heavy bleeding and pain. It can also be used to help thin the lining of the uterus before a surgery used to prevent or reduce heavy periods. This medicine may be used for other purposes; ask your health care provider or pharmacist if you have questions. COMMON BRAND NAME(S): Zoladex, Zoladex 3-Month What should I tell my care team before I take this medication? They need to know if you have any of these conditions: Bone problems Diabetes Heart disease History of irregular heartbeat or rhythm An unusual or allergic reaction to goserelin, other medications, foods, dyes, or preservatives Pregnant or trying to get pregnant Breastfeeding How should I use this medication? This medication is injected under the skin. It is given by your care team in a hospital or clinic setting. Talk to your care team about the use of this medication in children. Special care may be needed. Overdosage: If you think you have taken too much of this medicine contact a poison control center or emergency room at once. NOTE: This medicine is only for you. Do not share this medicine with others. What if I miss a dose? Keep appointments for follow-up doses. It is important not to miss your dose. Call your care team if you are unable to keep an appointment. What may interact with this medication? Do not take this medication with any of the following: Cisapride Dronedarone Pimozide Thioridazine This medication may also interact with the following: Other  medications that cause heart rhythm changes This list may not describe all possible interactions. Give your health care provider a list of all the medicines, herbs, non-prescription drugs, or dietary supplements you use. Also tell them if you smoke, drink alcohol, or use illegal drugs. Some items may interact with your medicine. What should I watch for while using this medication? Visit your care team for regular checks on your progress. Your symptoms may appear to get worse during the first weeks of this therapy. Tell your care team if your symptoms do not start to get better or if they get worse after this time. Using this medication for a long time may weaken your bones. If you smoke or frequently drink alcohol you may increase your risk of bone loss. A family history of osteoporosis, chronic use of medications for seizures (convulsions), or corticosteroids can also increase your risk of bone loss. The risk of bone fractures may be increased. Talk to your care team about your bone health. This medication may increase blood sugar. The risk may be higher in patients who already have diabetes. Ask your care team what you can do to lower your risk of diabetes while taking this medication. This medication should stop regular monthly menstruation in women. Tell your care team if you continue to menstruate. Talk to your care team if you wish to become pregnant or think you might be pregnant. This medication can cause serious birth defects if taken during pregnancy or for 12 weeks after stopping treatment. Talk to your care team about reliable forms of contraception. Do not breastfeed while taking this   medication. This medication may cause infertility. Talk to your care team if you are concerned about your fertility. What side effects may I notice from receiving this medication? Side effects that you should report to your care team as soon as possible: Allergic reactions--skin rash, itching, hives, swelling  of the face, lips, tongue, or throat Change in the amount of urine Heart attack--pain or tightness in the chest, shoulders, arms, or jaw, nausea, shortness of breath, cold or clammy skin, feeling faint or lightheaded Heart rhythm changes--fast or irregular heartbeat, dizziness, feeling faint or lightheaded, chest pain, trouble breathing High blood sugar (hyperglycemia)--increased thirst or amount of urine, unusual weakness or fatigue, blurry vision High calcium level--increased thirst or amount of urine, nausea, vomiting, confusion, unusual weakness or fatigue, bone pain Pain, redness, irritation, or bruising at the injection site Severe back pain, numbness or weakness of the hands, arms, legs, or feet, loss of coordination, loss of bowel or bladder control Stroke--sudden numbness or weakness of the face, arm, or leg, trouble speaking, confusion, trouble walking, loss of balance or coordination, dizziness, severe headache, change in vision Swelling and pain of the tumor site or lymph nodes Trouble passing urine Side effects that usually do not require medical attention (report to your care team if they continue or are bothersome): Change in sex drive or performance Headache Hot flashes Rapid or extreme change in emotion or mood Sweating Swelling of the ankles, hands, or feet Unusual vaginal discharge, itching, or odor This list may not describe all possible side effects. Call your doctor for medical advice about side effects. You may report side effects to FDA at 1-800-FDA-1088. Where should I keep my medication? This medication is given in a hospital or clinic. It will not be stored at home. NOTE: This sheet is a summary. It may not cover all possible information. If you have questions about this medicine, talk to your doctor, pharmacist, or health care provider.  2023 Elsevier/Gold Standard (2021-12-06 00:00:00)  

## 2022-10-25 ENCOUNTER — Ambulatory Visit: Payer: Self-pay | Admitting: Rehabilitation

## 2022-10-26 ENCOUNTER — Other Ambulatory Visit: Payer: Self-pay | Admitting: Family Medicine

## 2022-10-26 DIAGNOSIS — I10 Essential (primary) hypertension: Secondary | ICD-10-CM

## 2022-11-01 ENCOUNTER — Ambulatory Visit: Payer: 59 | Attending: Hematology and Oncology | Admitting: Rehabilitation

## 2022-11-01 ENCOUNTER — Encounter: Payer: Self-pay | Admitting: Rehabilitation

## 2022-11-01 DIAGNOSIS — Z923 Personal history of irradiation: Secondary | ICD-10-CM | POA: Insufficient documentation

## 2022-11-01 DIAGNOSIS — Z170 Estrogen receptor positive status [ER+]: Secondary | ICD-10-CM | POA: Diagnosis not present

## 2022-11-01 DIAGNOSIS — R293 Abnormal posture: Secondary | ICD-10-CM | POA: Diagnosis not present

## 2022-11-01 DIAGNOSIS — C50912 Malignant neoplasm of unspecified site of left female breast: Secondary | ICD-10-CM | POA: Insufficient documentation

## 2022-11-01 NOTE — Therapy (Signed)
OUTPATIENT PHYSICAL THERAPY BREAST CANCER BASELINE EVALUATION   Patient Name: [name] MRN: [id number] DOB:1976/02/23, 47 y.o., female Today's Date: 11/01/2022  END OF SESSION:  PT End of Session - 11/01/22 0902     Visit Number 1    Number of Visits 2    Date for PT Re-Evaluation 12/13/22    Authorization Type No Auth needed    PT Start Time 0905    PT Stop Time 0935    PT Time Calculation (min) 30 min    Activity Tolerance Patient tolerated treatment well    Behavior During Therapy [facility] for tasks assessed/performed             Past Medical History:  Diagnosis Date   Anemia    Back pain    Bimalleolar ankle fracture    left   Breast cancer ([place])    right breast- 2020   Family history of breast cancer    GERD (gastroesophageal reflux disease)    Hypertension    no meds   Kidney infection    1998- hosp.[facility]   Personal history of radiation therapy    Seizures ([place])    from Tamiflu, more than 10 years ago- no instances since   Past Surgical History:  Procedure Laterality Date   ANTERIOR LUMBAR FUSION  11/14/2011   Procedure: ANTERIOR LUMBAR FUSION 2 LEVELS;  Surgeon: Johnn Hai, MD;  Location: [facility];  Service: Orthopedics;  Laterality: N/A;  ALIF L5-S1   BACK SURGERY     BREAST LUMPECTOMY Right 01/2019   BREAST LUMPECTOMY WITH RADIOACTIVE SEED LOCALIZATION Right 01/13/2019   Procedure: RIGHT BREAST LUMPECTOMY WITH RADIOACTIVE SEED LOCALIZATION X3;  Surgeon: Erroll Luna, MD;  Location: [facility];  Service: General;  Laterality: Right;   CESAREAN SECTION     2007- /w epidural  anesthesia    COLONOSCOPY     LUMBAR LAMINECTOMY/DECOMPRESSION MICRODISCECTOMY Right 12/15/2015   Procedure:  MICO LUMBER DECOMPRESSION L4-5 ON THE RIGHT, ;  Surgeon: Susa Day, MD;  Location: [facility];  Service: Orthopedics;  Laterality: Right;   ORIF ANKLE FRACTURE Left 10/03/2017   Procedure: OPEN REDUCTION INTERNAL FIXATION (ORIF) ANKLE BIMALLEOLAR  FRACTURE; possible deltoid ligament repair;  Surgeon: Wylene Simmer, MD;  Location: [facility];  Service: Orthopedics;  Laterality: Left;   SPINE SURGERY     fusion   UPPER GASTROINTESTINAL ENDOSCOPY     WISDOM TOOTH EXTRACTION     young adult   Patient Active Problem List   Diagnosis Date Noted   Invasive ductal carcinoma of breast, female, left ([place]) 10/15/2022   Fever, unspecified 03/23/2019   Essential hypertension 03/13/2019   Elevated liver enzymes 11/06/2018   Genetic testing 10/06/2018   Family history of breast cancer    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   Dry eye 09/02/2017   Rectal bleed 05/20/2017   Elevated BP without diagnosis of hypertension 05/20/2017   Other dysphagia 02/15/2016   HNP (herniated nucleus pulposus), lumbar 12/15/2015   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 Carollee Herter  REFERRING PROVIDER: Dr. Chryl Heck  REFERRING DIAG: left breast cancer  THERAPY DIAG:  Invasive ductal carcinoma of breast, female, left ([place])  Abnormal posture  Rationale for Evaluation and Treatment: Rehabilitation  ONSET DATE: 10/14/22  SUBJECTIVE:                                                                                                                                                                                            SUBJECTIVE STATEMENT: Patient reports she is here today to be seen by her medical team for her newly diagnosed left breast cancer.   PERTINENT HISTORY:  Hx of Rt lumpectomy in 2020 for DCIS grade 3, radiation to the Rt breast. No nodes removed.  Recent Lt IDC ER/PR positive size of 86mm.  Will be having a Lt lumpectomy and SLNB on 11/15/22. Hx of lumbar fusion and disc removal.    PATIENT GOALS:   reduce lymphedema risk and learn post op HEP.   PAIN:  Are you having pain? no  PRECAUTIONS: Active CA   HAND DOMINANCE: right  WEIGHT BEARING RESTRICTIONS: No  FALLS:  Has patient fallen in last 6 months? No  LIVING ENVIRONMENT: Patient lives with: Husband and son  OCCUPATION: Secondary school teacher  - desk work   LEISURE: walking the dog.    PRIOR LEVEL OF FUNCTION: Independent   OBJECTIVE:  COGNITION: Overall cognitive status: Within functional limits for tasks assessed    POSTURE:  Forward head and rounded shoulders posture  UPPER EXTREMITY AROM/PROM:  A/PROM RIGHT   eval   Shoulder extension 40  Shoulder flexion 162  Shoulder abduction 160  Shoulder internal rotation   Shoulder external rotation 96    (Blank rows = not tested)  A/PROM LEFT   eval  Shoulder extension 42  Shoulder flexion 165  Shoulder abduction 165  Shoulder internal rotation   Shoulder external rotation 90    (Blank rows = not tested)  UPPER EXTREMITY STRENGTH: 5/5 bil   LYMPHEDEMA ASSESSMENTS:   LANDMARK RIGHT   eval  10 cm proximal to olecranon process 25.2  Olecranon process 23  10 cm proximal to ulnar styloid process 19.5  Just proximal to ulnar styloid process 14.7  Across hand at thumb web space 17.8  At base of 2nd digit 6.2  (Blank rows = not tested)  LANDMARK LEFT   eval  10 cm proximal to olecranon process 25.2  Olecranon process 23  10 cm proximal to ulnar styloid  process 18.8  Just proximal to ulnar styloid process 14.7  Across hand at thumb web space 18  At base  of 2nd digit 6.3  (Blank rows = not tested)  L-DEX LYMPHEDEMA SCREENING:  The patient was assessed using the L-Dex machine today to produce a lymphedema index baseline score. The patient will be reassessed on a regular basis (typically every 3 months) to obtain new L-Dex scores. If the score is > 6.5 points away from his/her baseline score indicating onset of subclinical lymphedema, it will be recommended to wear a compression garment for 4 weeks, 12 hours per day and then be reassessed. If the score continues to be > 6.5 points from baseline at reassessment, we will initiate lymphedema treatment. Assessing in this manner has a 95% rate of preventing clinically significant lymphedema.  L-DEX FLOWSHEETS - 11/01/22 0900       L-DEX LYMPHEDEMA SCREENING   Measurement Type Unilateral    L-DEX MEASUREMENT EXTREMITY Upper Extremity    POSITION  Standing    DOMINANT SIDE Right    At Risk Side Left    BASELINE SCORE (UNILATERAL) 3.8             QUICK DASH SURVEY: 9.09%  PATIENT EDUCATION:  Education details: Lymphedema risk reduction and post op shoulder/posture HEP Person educated: Patient Education method: Explanation, Demonstration, Handout Education comprehension: Patient verbalized understanding and returned demonstration  HOME EXERCISE PROGRAM: Patient was instructed today in a home exercise program today for post op shoulder range of motion. These included active assist shoulder flexion in sitting, scapular retraction, wall walking with shoulder abduction, and hands behind head external rotation.  She was encouraged to do these twice a day, holding 3 seconds and repeating 5 times when permitted by her physician.   ASSESSMENT:  CLINICAL IMPRESSION: Pt will benefit from a post op PT reassessment to determine needs and from L-Dex screens every 3 months for 2 years to detect subclinical lymphedema.  Pt will benefit from skilled therapeutic intervention to improve on the following  deficits: Decreased knowledge of precautions, impaired UE functional use, pain, decreased ROM, postural dysfunction.   PT treatment/interventions: ADL/self-care home management, pt/family education, therapeutic exercise  REHAB POTENTIAL: Excellent  CLINICAL DECISION MAKING: Stable/uncomplicated  EVALUATION COMPLEXITY: Low   GOALS: Goals reviewed with patient? YES  LONG TERM GOALS: (STG=LTG)    Name Target Date Goal status  1 Pt will be able to verbalize understanding of pertinent lymphedema risk reduction practices relevant to her dx specifically related to skin care.  Baseline:  No knowledge 11/01/2022 Achieved at eval  2 Pt will be able to return demo and/or verbalize understanding of the post op HEP related to regaining shoulder ROM. Baseline:  No knowledge 11/01/2022 Achieved at eval  3 Pt will be able to verbalize understanding of the importance of attending the post op After Breast CA Class for further lymphedema risk reduction education and therapeutic exercise.  Baseline:  No knowledge 11/01/2022 Achieved at eval  4 Pt will demo she has regained full shoulder ROM and function post operatively compared to baselines.  Baseline: See objective measurements taken today. 12/13/22     PLAN:  PT FREQUENCY/DURATION: EVAL and 1 follow up appointment.   PLAN FOR NEXT SESSION: will reassess 3-4 weeks post op to determine needs.   Patient will follow up at outpatient cancer rehab 3-4 weeks following surgery.  If the patient requires physical therapy at that time, a specific plan will be dictated and sent to the referring  physician for approval. The patient was educated today on appropriate basic range of motion exercises to begin post operatively and the importance of attending the After Breast Cancer class following surgery.  Patient was educated today on lymphedema risk reduction practices as it pertains to recommendations that will benefit the patient immediately following surgery.  She  verbalized good understanding.    Physical Therapy Information for After Breast Cancer Surgery/Treatment:  Lymphedema is a swelling condition that you may be at risk for in your arm if you have lymph nodes removed from the armpit area.  After a sentinel node biopsy, the risk is approximately 5-9% and is higher after an axillary node dissection.  There is treatment available for this condition and it is not life-threatening.  Contact your physician or physical therapist with concerns. You may begin the 4 shoulder/posture exercises (see additional sheet) when permitted by your physician (typically a week after surgery).  If you have drains, you may need to wait until those are removed before beginning range of motion exercises.  A general recommendation is to not lift your arms above shoulder height until drains are removed.  These exercises should be done to your tolerance and gently.  This is not a "no pain/no gain" type of recovery so listen to your body and stretch into the range of motion that you can tolerate, stopping if you have pain.  If you are having immediate reconstruction, ask your plastic surgeon about doing exercises as he or she may want you to wait. We encourage you to attend the free one time ABC (After Breast Cancer) class offered by [facility].  You will learn information related to lymphedema risk, prevention and treatment and additional exercises to regain mobility following surgery.  You can call [phone] for more information.  This is offered the 1st and 3rd Monday of each month.  You only attend the class one time. While undergoing any medical procedure or treatment, try to avoid blood pressure being taken or needle sticks from occurring on the arm on the side of cancer.   This recommendation begins after surgery and continues for the rest of your life.  This may help reduce your risk of getting lymphedema (swelling in your arm). An excellent resource for  those seeking information on lymphedema is the National Lymphedema Network's web site. It can be accessed at [place].org If you notice swelling in your hand, arm or breast at any time following surgery (even if it is many years from now), please contact your doctor or physical therapist to discuss this.  Lymphedema can be treated at any time but it is easier for you if it is treated early on.  If you feel like your shoulder motion is not returning to normal in a reasonable amount of time, please contact your surgeon or physical therapist.  [facility] [phone]. [address], [address], [place]  [address]  ABC CLASS After Breast Cancer Class  After Breast Cancer Class is a specially designed exercise class to assist you in a safe recover after having breast cancer surgery.  In this class you will learn how to get back to full function whether your drains were just removed or if you had surgery a month ago.  This one-time class is held the 1st and 3rd Monday of every month from 11:00 a.m. until 12:00 noon virtually.  This class is FREE and space is limited. For more information or to register for the  next available class, call [phone].  Class Goals  Understand specific stretches to improve the flexibility of you chest and shoulder. Learn ways to safely strengthen your upper body and improve your posture. Understand the warning signs of infection and why you may be at risk for an arm infection. Learn about Lymphedema and prevention.  ** You do not attend this class until after surgery.  Drains must be removed to participate  Patient was instructed today in a home exercise program today for post op shoulder range of motion. These included active assist shoulder flexion in sitting, scapular retraction, wall walking with shoulder abduction, and hands behind head external rotation.  She was encouraged to do these twice a day, holding 3 seconds and  repeating 5 times when permitted by her physician.    Stark Bray, PT 11/01/2022, 9:40 AM

## 2022-11-05 HISTORY — PX: BREAST LUMPECTOMY: SHX2

## 2022-11-06 NOTE — Pre-Procedure Instructions (Signed)
Surgical Instructions    Your procedure is scheduled on Thursday November 15, 2022  Report to [facility] Main Entrance "A" at 12 PM., then check in with the Admitting office.  Call this number if you have problems the morning of surgery:  [phone]  If you have any questions prior to your surgery date call [phone]: Open Monday-Friday 8am-4pm If you experience any cold or flu symptoms such as cough, fever, chills, shortness of breath, etc. between now and your scheduled surgery, please notify us at the above number.     Patient Instructions  The night before surgery:  No food after midnight. ONLY clear liquids after midnight  You may drink clear liquids until 11 AM the morning of your surgery.   Clear liquids allowed are: Water, Non-Citrus Juices (without pulp), Carbonated Beverages, Clear Tea, Black Coffee Only (NO MILK, CREAM OR POWDERED CREAMER of any kind), and Gatorade  The day of surgery (if you do NOT have diabetes):  Drink ONE (1) Pre-Surgery Clear Ensure by 11 AM the morning of surgery. Drink in one sitting. Do not sip.  This drink was given to you during your hospital  pre-op appointment visit.  Nothing else to drink after completing the  Pre-Surgery Clear Ensure.                  If you have questions, please contact your surgeon's office.  .    Take these medicines the morning of surgery with A SIP OF WATER :                   omeprazole (PRILOSEC)                  valACYclovir (VALTREX)               As needed: ALPRAZolam Duanne Moron)                                traMADol                                    As of today, STOP taking any Aspirin (unless otherwise instructed by your surgeon) Aleve, Naproxen, Ibuprofen, Motrin, Advil, Goody's, BC's, all herbal medications, fish oil, and all vitamins.                     Do NOT Smoke (Tobacco/Vaping) for 24 hours prior to your procedure.  If you use a CPAP at night, you may bring your mask/headgear for your  overnight stay.   Contacts, glasses, piercing's, hearing aid's, dentures or partials may not be worn into surgery, please bring cases for these belongings.    For patients admitted to the hospital, discharge time will be determined by your treatment team.   Patients discharged the day of surgery will not be allowed to drive home, and someone needs to stay with them for 24 hours.  SURGICAL WAITING ROOM VISITATION Patients having surgery or a procedure may have no more than 2 support people in the waiting area - these visitors may rotate.   Children under the age of 58 must have an adult with them who is not the patient. If the patient needs to stay at the hospital during part of their recovery, the visitor guidelines for inpatient rooms apply. Pre-op nurse will coordinate an appropriate time for  1 support person to accompany patient in pre-op.  This support person may not rotate.   Please refer to the [facility] website for the visitor guidelines for Inpatients (after your surgery is over and you are in a regular room).    Special instructions:   [facility]- Preparing For Surgery  Before surgery, you can play an important role. Because skin is not sterile, your skin needs to be as free of germs as possible. You can reduce the number of germs on your skin by washing with CHG (chlorahexidine gluconate) Soap before surgery.  CHG is an antiseptic cleaner which kills germs and bonds with the skin to continue killing germs even after washing.    Oral Hygiene is also important to reduce your risk of infection.  Remember - BRUSH YOUR TEETH THE MORNING OF SURGERY WITH YOUR REGULAR TOOTHPASTE  Please do not use if you have an allergy to CHG or antibacterial soaps. If your skin becomes reddened/irritated stop using the CHG.  Do not shave (including legs and underarms) for at least 48 hours prior to first CHG shower. It is OK to shave your face.  Please follow these instructions carefully.   Shower  the NIGHT BEFORE SURGERY and the MORNING OF SURGERY  If you chose to wash your hair, wash your hair first as usual with your normal shampoo.  After you shampoo, rinse your hair and body thoroughly to remove the shampoo.  Use CHG Soap as you would any other liquid soap. You can apply CHG directly to the skin and wash gently with a scrungie or a clean washcloth.   Apply the CHG Soap to your body ONLY FROM THE NECK DOWN.  Do not use on open wounds or open sores. Avoid contact with your eyes, ears, mouth and genitals (private parts). Wash Face and genitals (private parts)  with your normal soap.   Wash thoroughly, paying special attention to the area where your surgery will be performed.  Thoroughly rinse your body with warm water from the neck down.  DO NOT shower/wash with your normal soap after using and rinsing off the CHG Soap.  Pat yourself dry with a CLEAN TOWEL.  Wear CLEAN PAJAMAS to bed the night before surgery  Place CLEAN SHEETS on your bed the night before your surgery  DO NOT SLEEP WITH PETS.   Day of Surgery: Take a shower with CHG soap. Do not wear jewelry or makeup Do not wear lotions, powders, perfumes/colognes, or deodorant. Do not shave 48 hours prior to surgery.  Men may shave face and neck. Do not bring valuables to the hospital.  [facility] is not responsible for any belongings or valuables. Do not wear nail polish, gel polish, artificial nails, or any other type of covering on natural nails (fingers and toes) If you have artificial nails or gel coating that need to be removed by a nail salon, please have this removed prior to surgery. Artificial nails or gel coating may interfere with anesthesia's ability to adequately monitor your vital signs. Wear Clean/Comfortable clothing the morning of surgery Remember to brush your teeth WITH YOUR REGULAR TOOTHPASTE.   Please read over the following fact sheets that you were given.    If you received a COVID test  during your pre-op visit  it is requested that you wear a mask when out in public, stay away from anyone that may not be feeling well and notify your surgeon if you develop symptoms. If you have been in contact  with anyone that has tested positive in the last 10 days please notify you surgeon.

## 2022-11-07 ENCOUNTER — Encounter (HOSPITAL_COMMUNITY): Payer: Self-pay

## 2022-11-07 ENCOUNTER — Encounter (HOSPITAL_COMMUNITY)
Admission: RE | Admit: 2022-11-07 | Discharge: 2022-11-07 | Disposition: A | Discharge status: Home / Self Care | Payer: 59 | Source: Ambulatory Visit | Attending: Surgery | Admitting: Surgery

## 2022-11-07 ENCOUNTER — Other Ambulatory Visit: Payer: Self-pay

## 2022-11-07 VITALS — BP 125/95 | HR 99 | Temp 99.0°F | Resp 16 | Ht 63.0 in | Wt 136.3 lb

## 2022-11-07 DIAGNOSIS — Z87891 Personal history of nicotine dependence: Secondary | ICD-10-CM | POA: Insufficient documentation

## 2022-11-07 DIAGNOSIS — Z853 Personal history of malignant neoplasm of breast: Secondary | ICD-10-CM | POA: Diagnosis not present

## 2022-11-07 DIAGNOSIS — K219 Gastro-esophageal reflux disease without esophagitis: Secondary | ICD-10-CM | POA: Insufficient documentation

## 2022-11-07 DIAGNOSIS — I10 Essential (primary) hypertension: Secondary | ICD-10-CM | POA: Diagnosis not present

## 2022-11-07 DIAGNOSIS — C50912 Malignant neoplasm of unspecified site of left female breast: Secondary | ICD-10-CM | POA: Diagnosis not present

## 2022-11-07 DIAGNOSIS — Z01818 Encounter for other preprocedural examination: Secondary | ICD-10-CM | POA: Diagnosis present

## 2022-11-07 LAB — CBC
HCT: 33.5 % — ABNORMAL LOW (ref 36.0–46.0)
Hemoglobin: 12.2 g/dL (ref 12.0–15.0)
MCH: 42.5 pg — ABNORMAL HIGH (ref 26.0–34.0)
MCHC: 36.4 g/dL — ABNORMAL HIGH (ref 30.0–36.0)
MCV: 116.7 fL — ABNORMAL HIGH (ref 80.0–100.0)
Platelets: 213 10*3/uL (ref 150–400)
RBC: 2.87 MIL/uL — ABNORMAL LOW (ref 3.87–5.11)
RDW: 13.3 % (ref 11.5–15.5)
WBC: 3.6 10*3/uL — ABNORMAL LOW (ref 4.0–10.5)
nRBC: 0 % (ref 0.0–0.2)

## 2022-11-07 LAB — BASIC METABOLIC PANEL
Anion gap: 14 (ref 5–15)
BUN: <5 mg/dL — ABNORMAL LOW (ref 6–20)
CO2: 29 mmol/L (ref 22–32)
Calcium: 8.9 mg/dL (ref 8.9–10.3)
Chloride: 95 mmol/L — ABNORMAL LOW (ref 98–111)
Creatinine, Ser: 0.53 mg/dL (ref 0.44–1.00)
GFR, Estimated: >60 mL/min (ref >60)
Glucose, Bld: 108 mg/dL — ABNORMAL HIGH (ref 70–99)
Potassium: 3.4 mmol/L — ABNORMAL LOW (ref 3.5–5.1)
Sodium: 138 mmol/L (ref 135–145)

## 2022-11-07 NOTE — Progress Notes (Signed)
PCP - Dr. Roma Schanz Cardiologist - denies  PPM/ICD - denies   Chest x-ray - denies EKG - 11/07/22 Stress Test - denies ECHO - denies Cardiac Cath - denies  Sleep Study - denies   DM- denies  ASA/Blood Thinner Instructions: n/a   ERAS Protcol - yes PRE-SURGERY Ensure given at PAT  COVID TEST- n/a   Anesthesia review: yes, seed placement  Patient denies shortness of breath, fever, cough and chest pain at PAT appointment   All instructions explained to the patient, with a verbal understanding of the material. Patient agrees to go over the instructions while at home for a better understanding. The opportunity to ask questions was provided.

## 2022-11-07 NOTE — Pre-Procedure Instructions (Signed)
Surgical Instructions    Your procedure is scheduled on Thursday November 15, 2022  Report to [facility] Main Entrance "A" at 12 PM., then check in with the Admitting office.  Call this number if you have problems the morning of surgery:  [phone]  If you have any questions prior to your surgery date call [phone]: Open Monday-Friday 8am-4pm If you experience any cold or flu symptoms such as cough, fever, chills, shortness of breath, etc. between now and your scheduled surgery, please notify us at the above number.     Patient Instructions  The night before surgery:  No food after midnight. ONLY clear liquids after midnight  You may drink clear liquids until 11 AM the morning of your surgery.   Clear liquids allowed are: Water, Non-Citrus Juices (without pulp), Carbonated Beverages, Clear Tea, Black Coffee Only (NO MILK, CREAM OR POWDERED CREAMER of any kind), and Gatorade  The day of surgery (if you do NOT have diabetes):  Drink ONE (1) Pre-Surgery Clear Ensure by 11 AM the morning of surgery. Drink in one sitting. Do not sip.  This drink was given to you during your hospital  pre-op appointment visit.  Nothing else to drink after completing the  Pre-Surgery Clear Ensure.                  If you have questions, please contact your surgeon's office.  .    Take these medicines the morning of surgery with A SIP OF WATER :               omeprazole (PRILOSEC)  tamoxifen (NOLVADEX)               As needed:  ALPRAZolam Duanne Moron)              valACYclovir (VALTREX)                                    As of today, STOP taking any Aspirin (unless otherwise instructed by your surgeon) Aleve, Naproxen, Ibuprofen, Motrin, Advil, Goody's, BC's, all herbal medications, fish oil, and all vitamins.                     Do NOT Smoke (Tobacco/Vaping) for 24 hours prior to your procedure.  If you use a CPAP at night, you may bring your mask/headgear for your overnight stay.    Contacts, glasses, piercing's, hearing aid's, dentures or partials may not be worn into surgery, please bring cases for these belongings.    For patients admitted to the hospital, discharge time will be determined by your treatment team.   Patients discharged the day of surgery will not be allowed to drive home, and someone needs to stay with them for 24 hours.  SURGICAL WAITING ROOM VISITATION Patients having surgery or a procedure may have no more than 2 support people in the waiting area - these visitors may rotate.   Children under the age of 2 must have an adult with them who is not the patient. If the patient needs to stay at the hospital during part of their recovery, the visitor guidelines for inpatient rooms apply. Pre-op nurse will coordinate an appropriate time for 1 support person to accompany patient in pre-op.  This support person may not rotate.   Please refer to the [facility] website for the visitor guidelines for Inpatients (after your surgery is over and you  are in a regular room).    Special instructions:   [facility]- Preparing For Surgery  Before surgery, you can play an important role. Because skin is not sterile, your skin needs to be as free of germs as possible. You can reduce the number of germs on your skin by washing with CHG (chlorahexidine gluconate) Soap before surgery.  CHG is an antiseptic cleaner which kills germs and bonds with the skin to continue killing germs even after washing.    Oral Hygiene is also important to reduce your risk of infection.  Remember - BRUSH YOUR TEETH THE MORNING OF SURGERY WITH YOUR REGULAR TOOTHPASTE  Please do not use if you have an allergy to CHG or antibacterial soaps. If your skin becomes reddened/irritated stop using the CHG.  Do not shave (including legs and underarms) for at least 48 hours prior to first CHG shower. It is OK to shave your face.  Please follow these instructions carefully.   Shower the NIGHT BEFORE  SURGERY and the MORNING OF SURGERY  If you chose to wash your hair, wash your hair first as usual with your normal shampoo.  After you shampoo, rinse your hair and body thoroughly to remove the shampoo.  Use CHG Soap as you would any other liquid soap. You can apply CHG directly to the skin and wash gently with a scrungie or a clean washcloth.   Apply the CHG Soap to your body ONLY FROM THE NECK DOWN.  Do not use on open wounds or open sores. Avoid contact with your eyes, ears, mouth and genitals (private parts). Wash Face and genitals (private parts)  with your normal soap.   Wash thoroughly, paying special attention to the area where your surgery will be performed.  Thoroughly rinse your body with warm water from the neck down.  DO NOT shower/wash with your normal soap after using and rinsing off the CHG Soap.  Pat yourself dry with a CLEAN TOWEL.  Wear CLEAN PAJAMAS to bed the night before surgery  Place CLEAN SHEETS on your bed the night before your surgery  DO NOT SLEEP WITH PETS.   Day of Surgery: Take a shower with CHG soap. Do not wear jewelry or makeup Do not wear lotions, powders, perfumes, or deodorant. Do not shave 48 hours prior to surgery.   Do not bring valuables to the hospital. [facility] is not responsible for any belongings or valuables. Do not wear nail polish, gel polish, artificial nails, or any other type of covering on natural nails (fingers and toes) If you have artificial nails or gel coating that need to be removed by a nail salon, please have this removed prior to surgery. Artificial nails or gel coating may interfere with anesthesia's ability to adequately monitor your vital signs. Wear Clean/Comfortable clothing the morning of surgery Remember to brush your teeth WITH YOUR REGULAR TOOTHPASTE.   Please read over the following fact sheets that you were given.    If you received a COVID test during your pre-op visit  it is requested that you wear a  mask when out in public, stay away from anyone that may not be feeling well and notify your surgeon if you develop symptoms. If you have been in contact with anyone that has tested positive in the last 10 days please notify you surgeon.

## 2022-11-08 NOTE — Progress Notes (Addendum)
Anesthesia Chart Review:  Case: [id number] Date/Time: 11/15/22 1345   Procedure: LEFT BREAST LUMPECTOMY WITH RADIOACTIVE SEED AND SENTINEL LYMPH NODE BIOPSY (Left: Breast)   Anesthesia type: General   Pre-op diagnosis: LEFT BREAST CANCER   [facility] RO[facility] 07 / [facility] OR   Surgeons: Harriette Bouillon, MD       DISCUSSION: Patient is a 47 year old female scheduled for the above procedure.  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  She reported had Mirena IUD removed on 11/07/22 at [facility]. She was prescribed goserelin acetate 3.6 mg Q 28 days, first dose 10/24/22 through [facility].    RSL is scheduled for 11/13/22 at 10:30 AM. 11/09/22 urine pregnancy test was negative.   Anesthesia team to evaluate on the day of surgery.   VS: BP (!) 125/95   Pulse 99   Temp 37.2 C   Resp 16   Ht 5\' 3"  (1.6 m)   Wt 61.8 kg   SpO2 98%   BMI 24.14 kg/m    PROVIDERS: Zola Button, Grayling Congress, DO Rachel Moulds, MD is HEM-ONC Dorothy Puffer, MD is RAD-ONC   LABS: Labs reviewed: Acceptable for surgery. (all labs ordered are listed, but only abnormal results are displayed)  Labs Reviewed  BASIC METABOLIC PANEL - Abnormal; Notable for the following components:      Result Value   Potassium 3.4 (*)    Chloride 95 (*)    Glucose, Bld 108 (*)    BUN <5 (*)    All other components within normal limits  CBC - Abnormal; Notable for the following components:   WBC 3.6 (*)    RBC 2.87 (*)    HCT 33.5 (*)    MCV 116.7 (*)    MCH 42.5 (*)    MCHC 36.4 (*)    All other components within normal limits    EKG: 11/07/22: Normal sinus rhythm Low voltage QRS   CV: N/A  Past Medical History:  Diagnosis Date   Anemia    Back pain    Bimalleolar ankle  fracture    left   Breast cancer    right breast- 2020   Breast cancer 09/2022   left breast   Family history of breast cancer    GERD (gastroesophageal reflux disease)    Hypertension    no meds   Kidney infection    1998- [facility]   Personal history of radiation therapy    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, more than 10 years ago- no instances since    Past Surgical History:  Procedure Laterality Date   ANTERIOR LUMBAR FUSION  11/14/2011   Procedure: ANTERIOR LUMBAR FUSION 2 LEVELS;  Surgeon: Javier Docker, MD;  Location: [facility] OR;  Service: Orthopedics;  Laterality: N/A;  ALIF L5-S1   BACK SURGERY     BREAST LUMPECTOMY Right 01/2019   BREAST LUMPECTOMY WITH RADIOACTIVE SEED LOCALIZATION Right 01/13/2019   Procedure: RIGHT BREAST LUMPECTOMY WITH RADIOACTIVE SEED LOCALIZATION X3;  Surgeon: Harriette Bouillon, MD;  Location: [facility];  Service: General;  Laterality: Right;   CESAREAN SECTION     2007- /w epidural  anesthesia    COLONOSCOPY     LUMBAR LAMINECTOMY/DECOMPRESSION MICRODISCECTOMY Right 12/15/2015   Procedure:  MICO LUMBER DECOMPRESSION L4-5  ON THE RIGHT, ;  Surgeon: Jene Every, MD;  Location: [facility] ORS;  Service: Orthopedics;  Laterality: Right;   ORIF ANKLE FRACTURE Left 10/03/2017   Procedure: OPEN REDUCTION INTERNAL FIXATION (ORIF) ANKLE BIMALLEOLAR FRACTURE; possible deltoid ligament repair;  Surgeon: Toni Arthurs, MD;  Location: [facility];  Service: Orthopedics;  Laterality: Left;   SPINE SURGERY     fusion   UPPER GASTROINTESTINAL ENDOSCOPY     WISDOM TOOTH EXTRACTION     young adult    MEDICATIONS:  ALPRAZolam (XANAX) 0.5 MG tablet   ELDERBERRY PO   estradiol (ESTRACE VAGINAL) 0.1 MG/GM vaginal cream   levonorgestrel (MIRENA) 20 MCG/24HR IUD   losartan (COZAAR) 25 MG tablet   omeprazole (PRILOSEC) 40 MG capsule   tamoxifen (NOLVADEX) 20 MG tablet   traMADol (ULTRAM) 50 MG tablet   valACYclovir (VALTREX) 1000 MG tablet   No current  facility-administered medications for this encounter.    Shonna Chock, PA-C Surgical Short Stay/Anesthesiology [facility] Phone [phone] [facility] Phone [phone] 11/09/2022 12:34 PM

## 2022-11-08 NOTE — Anesthesia Preprocedure Evaluation (Addendum)
Anesthesia Evaluation  Patient identified by MRN, date of birth, ID band Patient awake    Reviewed: Allergy & Precautions, NPO status , Patient's Chart, lab work & pertinent test results, reviewed documented beta blocker date and time   Airway Mallampati: II  TM Distance: >3 FB Neck ROM: Full    Dental no notable dental hx. (+) Teeth Intact, Dental Advisory Given, Caps   Pulmonary former smoker   Pulmonary exam normal breath sounds clear to auscultation       Cardiovascular hypertension, Pt. on medications Normal cardiovascular exam Rhythm:Regular Rate:Normal     Neuro/Psych Seizures -, Well Controlled,  PSYCHIATRIC DISORDERS      ADHDHx/o seizures from Tamiflu 10 years ago    GI/Hepatic ,GERD  Medicated,,Hx/o elevated LFT's    Endo/Other  Left Breast Ca Hx/o right breast DCIS Hyperlipidemia  Renal/GU Renal disease     Musculoskeletal  (+) Arthritis , Osteoarthritis,    Abdominal   Peds  Hematology  (+) Blood dyscrasia, anemia   Anesthesia Other Findings   Reproductive/Obstetrics                             Anesthesia Physical Anesthesia Plan  ASA: 2  Anesthesia Plan: General   Post-op Pain Management: Regional block* and Minimal or no pain anticipated   Induction: Intravenous  PONV Risk Score and Plan: 4 or greater and Treatment may vary due to age or medical condition and Ondansetron  Airway Management Planned: LMA  Additional Equipment: None  Intra-op Plan:   Post-operative Plan: Extubation in OR  Informed Consent: I have reviewed the patients History and Physical, chart, labs and discussed the procedure including the risks, benefits and alternatives for the proposed anesthesia with the patient or authorized representative who has indicated his/her understanding and acceptance.     Dental advisory given  Plan Discussed with: Anesthesiologist and CRNA  Anesthesia  Plan Comments: (PAT note written by Shonna Chock, PA-C.  )       Anesthesia Quick Evaluation

## 2022-11-09 ENCOUNTER — Encounter (HOSPITAL_COMMUNITY)
Admission: RE | Admit: 2022-11-09 | Discharge: 2022-11-09 | Disposition: A | Discharge status: Home / Self Care | Payer: 59 | Source: Ambulatory Visit | Attending: Surgery | Admitting: Surgery

## 2022-11-09 DIAGNOSIS — Z01812 Encounter for preprocedural laboratory examination: Secondary | ICD-10-CM | POA: Insufficient documentation

## 2022-11-09 LAB — POCT PREGNANCY, URINE: Preg Test, Ur: NEGATIVE

## 2022-11-10 ENCOUNTER — Other Ambulatory Visit: Payer: Self-pay | Admitting: Family Medicine

## 2022-11-10 DIAGNOSIS — I10 Essential (primary) hypertension: Secondary | ICD-10-CM

## 2022-11-12 ENCOUNTER — Telehealth: Payer: Self-pay | Admitting: *Deleted

## 2022-11-12 ENCOUNTER — Telehealth: Payer: Self-pay | Admitting: Family Medicine

## 2022-11-12 ENCOUNTER — Other Ambulatory Visit: Payer: Self-pay | Admitting: *Deleted

## 2022-11-12 DIAGNOSIS — I10 Essential (primary) hypertension: Secondary | ICD-10-CM

## 2022-11-12 NOTE — Telephone Encounter (Signed)
Denied. Last OV 03/2020.

## 2022-11-12 NOTE — Telephone Encounter (Signed)
Pt called this RN to request a refill on tamoxifen.   This RN inquired if she has proceeded with surgery - with pt stating first " yes that was 4 years ago "- This RN inquired about new area- with pt stating " oh that is this Thursday"  This RN informed her of need to not take the tamoxifen due to pending surgery with pt concerned about when to resume post surgery " because the preadmission nurse told me I could take it just not the morning of surgery"  This RN explained to her need for MD to review above to discuss resuming of medication -presently she needs to not take it.  Note pt had zoladex on 10/24/2022 with next dose scheduled 11/23/2022 with MD follow up with dr Al Pimple 12/18/2022.  This note will be forwarded to MD for review and appropriate recommendations.

## 2022-11-12 NOTE — Telephone Encounter (Signed)
Prescription Request  11/12/2022  Is this a "Controlled Substance" medicine? No  LOV: Visit date not found  What is the name of the medication or equipment?   losartan (COZAAR) 25 MG tablet [[id number]]   Have you contacted your pharmacy to request a refill? No   Which pharmacy would you like this sent to?  [id number] [place], [place] - [address] [place] [address] Phone: [phone] Fax: [phone]    Patient notified that their request is being sent to the clinical staff for review and that they should receive a response within 2 business days.   Please advise at Mobile [phone] (mobile)

## 2022-11-13 ENCOUNTER — Ambulatory Visit
Admission: RE | Admit: 2022-11-13 | Discharge: 2022-11-13 | Disposition: A | Discharge status: Home / Self Care | Payer: 59 | Source: Ambulatory Visit | Attending: Surgery | Admitting: Surgery

## 2022-11-13 ENCOUNTER — Encounter: Payer: Self-pay | Admitting: Hematology and Oncology

## 2022-11-13 DIAGNOSIS — C50912 Malignant neoplasm of unspecified site of left female breast: Secondary | ICD-10-CM

## 2022-11-13 HISTORY — PX: BREAST BIOPSY: SHX20

## 2022-11-13 NOTE — Telephone Encounter (Signed)
Pt was called to advise of medication denial. Pt asked if there was any way she could get just a months supply to hold her until most of the surgery-based items are out of the air. Please Advise.

## 2022-11-14 MED ORDER — LOSARTAN POTASSIUM 25 MG PO TABS
25.0000 mg | ORAL_TABLET | Freq: Every day | ORAL | 0 refills | Status: DC
Start: 2022-11-14 — End: 2022-12-06

## 2022-11-14 NOTE — Addendum Note (Signed)
Addended by: Roxanne Gates on: 11/14/2022 08:42 AM   Modules accepted: Orders

## 2022-11-14 NOTE — Telephone Encounter (Signed)
30 day supply sent

## 2022-11-14 NOTE — Progress Notes (Signed)
Spoke with the pt, to arrive tom at 1130. To stop clear liquids at 1030.

## 2022-11-15 ENCOUNTER — Ambulatory Visit (HOSPITAL_COMMUNITY): Payer: 59 | Admitting: Vascular Surgery

## 2022-11-15 ENCOUNTER — Other Ambulatory Visit: Payer: Self-pay

## 2022-11-15 ENCOUNTER — Encounter (HOSPITAL_COMMUNITY): Payer: Self-pay | Admitting: Surgery

## 2022-11-15 ENCOUNTER — Ambulatory Visit (HOSPITAL_COMMUNITY)
Admission: RE | Admit: 2022-11-15 | Discharge: 2022-11-15 | Disposition: A | Discharge status: Home / Self Care | Payer: 59 | Attending: Surgery | Admitting: Surgery

## 2022-11-15 ENCOUNTER — Encounter (HOSPITAL_COMMUNITY)
Admission: RE | Disposition: A | Discharge status: Home / Self Care | Payer: Self-pay | Source: Home / Self Care | Attending: Surgery

## 2022-11-15 ENCOUNTER — Ambulatory Visit
Admission: RE | Admit: 2022-11-15 | Discharge: 2022-11-15 | Disposition: A | Discharge status: Home / Self Care | Payer: 59 | Source: Ambulatory Visit | Attending: Surgery | Admitting: Surgery

## 2022-11-15 DIAGNOSIS — C50912 Malignant neoplasm of unspecified site of left female breast: Secondary | ICD-10-CM

## 2022-11-15 DIAGNOSIS — F17210 Nicotine dependence, cigarettes, uncomplicated: Secondary | ICD-10-CM | POA: Diagnosis not present

## 2022-11-15 DIAGNOSIS — Z79810 Long term (current) use of selective estrogen receptor modulators (SERMs): Secondary | ICD-10-CM | POA: Diagnosis not present

## 2022-11-15 DIAGNOSIS — Z170 Estrogen receptor positive status [ER+]: Secondary | ICD-10-CM | POA: Diagnosis not present

## 2022-11-15 DIAGNOSIS — I10 Essential (primary) hypertension: Secondary | ICD-10-CM | POA: Insufficient documentation

## 2022-11-15 DIAGNOSIS — Z923 Personal history of irradiation: Secondary | ICD-10-CM | POA: Insufficient documentation

## 2022-11-15 DIAGNOSIS — D649 Anemia, unspecified: Secondary | ICD-10-CM | POA: Insufficient documentation

## 2022-11-15 DIAGNOSIS — D759 Disease of blood and blood-forming organs, unspecified: Secondary | ICD-10-CM | POA: Insufficient documentation

## 2022-11-15 DIAGNOSIS — C50312 Malignant neoplasm of lower-inner quadrant of left female breast: Secondary | ICD-10-CM | POA: Diagnosis present

## 2022-11-15 DIAGNOSIS — K219 Gastro-esophageal reflux disease without esophagitis: Secondary | ICD-10-CM | POA: Diagnosis not present

## 2022-11-15 DIAGNOSIS — Z01818 Encounter for other preprocedural examination: Secondary | ICD-10-CM

## 2022-11-15 HISTORY — PX: BREAST LUMPECTOMY WITH RADIOACTIVE SEED AND SENTINEL LYMPH NODE BIOPSY: SHX6550

## 2022-11-15 SURGERY — BREAST LUMPECTOMY WITH RADIOACTIVE SEED AND SENTINEL LYMPH NODE BIOPSY
Anesthesia: General | Site: Breast | Laterality: Left

## 2022-11-15 MED ORDER — OXYCODONE HCL 5 MG/5ML PO SOLN: 5.0000 mg | Freq: Once | ORAL | Status: DC | PRN

## 2022-11-15 MED ORDER — ACETAMINOPHEN 500 MG PO TABS: 1000.0000 mg | ORAL_TABLET | ORAL | Status: AC

## 2022-11-15 MED ORDER — MIDAZOLAM HCL 2 MG/2ML IJ SOLN: 2.0000 mg | Freq: Once | INTRAMUSCULAR | Status: AC

## 2022-11-15 MED ORDER — ORAL CARE MOUTH RINSE: 15.0000 mL | Freq: Once | OROMUCOSAL | Status: AC

## 2022-11-15 MED ORDER — MIDAZOLAM HCL 2 MG/2ML IJ SOLN
INTRAMUSCULAR | Status: AC
  Filled 2022-11-15: qty 2

## 2022-11-15 MED ORDER — ONDANSETRON HCL 4 MG/2ML IJ SOLN: 4.0000 mg | Freq: Once | INTRAMUSCULAR | Status: DC | PRN

## 2022-11-15 MED ORDER — CEFAZOLIN SODIUM-DEXTROSE 2-4 GM/100ML-% IV SOLN
INTRAVENOUS | Status: AC
  Filled 2022-11-15: qty 100

## 2022-11-15 MED ORDER — CHLORHEXIDINE GLUCONATE CLOTH 2 % EX PADS: 6.0000 | MEDICATED_PAD | Freq: Once | CUTANEOUS | Status: DC

## 2022-11-15 MED ORDER — HYDROMORPHONE HCL 1 MG/ML IJ SOLN
0.2500 mg | INTRAMUSCULAR | Status: DC | PRN
  Administered 2022-11-15 (×2): 0.5 mg via INTRAVENOUS

## 2022-11-15 MED ORDER — BUPIVACAINE LIPOSOME 1.3 % IJ SUSP
INTRAMUSCULAR | Status: DC | PRN
  Administered 2022-11-15: 10 mL via PERINEURAL

## 2022-11-15 MED ORDER — GLYCOPYRROLATE PF 0.2 MG/ML IJ SOSY
PREFILLED_SYRINGE | INTRAMUSCULAR | Status: DC | PRN
  Administered 2022-11-15: .1 mg via INTRAVENOUS

## 2022-11-15 MED ORDER — CHLORHEXIDINE GLUCONATE 0.12 % MT SOLN
OROMUCOSAL | Status: AC
  Administered 2022-11-15: 15 mL via OROMUCOSAL
  Filled 2022-11-15: qty 15

## 2022-11-15 MED ORDER — FENTANYL CITRATE (PF) 100 MCG/2ML IJ SOLN
INTRAMUSCULAR | Status: AC
  Administered 2022-11-15: 100 ug via INTRAVENOUS
  Filled 2022-11-15: qty 2

## 2022-11-15 MED ORDER — CEFAZOLIN SODIUM-DEXTROSE 2-4 GM/100ML-% IV SOLN
2.0000 g | INTRAVENOUS | Status: AC
  Administered 2022-11-15: 2 g via INTRAVENOUS

## 2022-11-15 MED ORDER — LIDOCAINE 2% (20 MG/ML) 5 ML SYRINGE
INTRAMUSCULAR | Status: AC
  Filled 2022-11-15: qty 5

## 2022-11-15 MED ORDER — FENTANYL CITRATE (PF) 100 MCG/2ML IJ SOLN: 100.0000 ug | Freq: Once | INTRAMUSCULAR | Status: AC

## 2022-11-15 MED ORDER — ONDANSETRON HCL 4 MG/2ML IJ SOLN
INTRAMUSCULAR | Status: AC
  Filled 2022-11-15: qty 2

## 2022-11-15 MED ORDER — LACTATED RINGERS IV SOLN: INTRAVENOUS | Status: DC

## 2022-11-15 MED ORDER — PHENYLEPHRINE HCL-NACL 20-0.9 MG/250ML-% IV SOLN
INTRAVENOUS | Status: DC | PRN
  Administered 2022-11-15: 30 ug/min via INTRAVENOUS

## 2022-11-15 MED ORDER — FENTANYL CITRATE (PF) 250 MCG/5ML IJ SOLN
INTRAMUSCULAR | Status: DC | PRN
  Administered 2022-11-15: 50 ug via INTRAVENOUS

## 2022-11-15 MED ORDER — OXYCODONE HCL 5 MG PO TABS: 5.0000 mg | ORAL_TABLET | Freq: Once | ORAL | Status: DC | PRN

## 2022-11-15 MED ORDER — GLYCOPYRROLATE PF 0.2 MG/ML IJ SOSY
PREFILLED_SYRINGE | INTRAMUSCULAR | Status: AC
  Filled 2022-11-15: qty 1

## 2022-11-15 MED ORDER — PROPOFOL 10 MG/ML IV BOLUS
INTRAVENOUS | Status: DC | PRN
  Administered 2022-11-15: 40 mg via INTRAVENOUS
  Administered 2022-11-15: 120 mg via INTRAVENOUS
  Administered 2022-11-15: 40 mg via INTRAVENOUS

## 2022-11-15 MED ORDER — DEXMEDETOMIDINE HCL IN NACL 80 MCG/20ML IV SOLN
INTRAVENOUS | Status: DC | PRN
  Administered 2022-11-15 (×2): 8 ug via BUCCAL

## 2022-11-15 MED ORDER — BUPIVACAINE-EPINEPHRINE 0.5% -1:200000 IJ SOLN
INTRAMUSCULAR | Status: DC | PRN
  Administered 2022-11-15: 30 mL

## 2022-11-15 MED ORDER — 0.9 % SODIUM CHLORIDE (POUR BTL) OPTIME
TOPICAL | Status: DC | PRN
  Administered 2022-11-15: 1000 mL

## 2022-11-15 MED ORDER — FENTANYL CITRATE (PF) 250 MCG/5ML IJ SOLN
INTRAMUSCULAR | Status: AC
  Filled 2022-11-15: qty 5

## 2022-11-15 MED ORDER — DEXAMETHASONE SODIUM PHOSPHATE 10 MG/ML IJ SOLN
INTRAMUSCULAR | Status: AC
  Filled 2022-11-15: qty 1

## 2022-11-15 MED ORDER — BUPIVACAINE HCL (PF) 0.5 % IJ SOLN
INTRAMUSCULAR | Status: DC | PRN
  Administered 2022-11-15: 20 mL via PERINEURAL

## 2022-11-15 MED ORDER — ONDANSETRON HCL 4 MG/2ML IJ SOLN
INTRAMUSCULAR | Status: DC | PRN
  Administered 2022-11-15: 4 mg via INTRAVENOUS

## 2022-11-15 MED ORDER — BUPIVACAINE LIPOSOME 1.3 % IJ SUSP
INTRAMUSCULAR | Status: AC
  Filled 2022-11-15: qty 10

## 2022-11-15 MED ORDER — MAGTRACE LYMPHATIC TRACER
INTRAMUSCULAR | Status: DC | PRN
  Administered 2022-11-15: 1 mL via INTRAMUSCULAR

## 2022-11-15 MED ORDER — ACETAMINOPHEN 500 MG PO TABS
ORAL_TABLET | ORAL | Status: AC
  Administered 2022-11-15: 1000 mg via ORAL
  Filled 2022-11-15: qty 2

## 2022-11-15 MED ORDER — OXYCODONE HCL 5 MG PO TABS
5.0000 mg | ORAL_TABLET | Freq: Four times a day (QID) | ORAL | 0 refills | Status: DC | PRN
Start: 2022-11-15 — End: 2022-11-20

## 2022-11-15 MED ORDER — MIDAZOLAM HCL 2 MG/2ML IJ SOLN
INTRAMUSCULAR | Status: AC
  Administered 2022-11-15: 2 mg via INTRAVENOUS
  Filled 2022-11-15: qty 2

## 2022-11-15 MED ORDER — PHENYLEPHRINE 80 MCG/ML (10ML) SYRINGE FOR IV PUSH (FOR BLOOD PRESSURE SUPPORT)
PREFILLED_SYRINGE | INTRAVENOUS | Status: DC | PRN
  Administered 2022-11-15 (×2): 80 ug via INTRAVENOUS

## 2022-11-15 MED ORDER — HYDROMORPHONE HCL 1 MG/ML IJ SOLN
INTRAMUSCULAR | Status: AC
  Filled 2022-11-15: qty 1

## 2022-11-15 MED ORDER — CHLORHEXIDINE GLUCONATE 0.12 % MT SOLN: 15.0000 mL | Freq: Once | OROMUCOSAL | Status: AC

## 2022-11-15 MED ORDER — DEXAMETHASONE SODIUM PHOSPHATE 10 MG/ML IJ SOLN
INTRAMUSCULAR | Status: DC | PRN
  Administered 2022-11-15: 8 mg via INTRAVENOUS

## 2022-11-15 MED ORDER — LIDOCAINE 2% (20 MG/ML) 5 ML SYRINGE
INTRAMUSCULAR | Status: DC | PRN
  Administered 2022-11-15: 60 mg via INTRAVENOUS

## 2022-11-15 MED ORDER — PROPOFOL 10 MG/ML IV BOLUS
INTRAVENOUS | Status: AC
  Filled 2022-11-15: qty 20

## 2022-11-15 MED ORDER — FENTANYL CITRATE (PF) 100 MCG/2ML IJ SOLN
INTRAMUSCULAR | Status: AC
  Filled 2022-11-15: qty 2

## 2022-11-15 SURGICAL SUPPLY — 48 items
ADH SKN CLS APL DERMABOND .7 (GAUZE/BANDAGES/DRESSINGS) ×1
ADH SKN CLS LQ APL DERMABOND (GAUZE/BANDAGES/DRESSINGS) ×1
APL PRP STRL LF DISP 70% ISPRP (MISCELLANEOUS) ×1
APPLIER CLIP 9.375 MED OPEN (MISCELLANEOUS) ×1
APR CLP MED 9.3 20 MLT OPN (MISCELLANEOUS) ×1
BAG COUNTER SPONGE SURGICOUNT (BAG) IMPLANT
BAG SPNG CNTER NS LX DISP (BAG)
BINDER BREAST LRG (GAUZE/BANDAGES/DRESSINGS) IMPLANT
BINDER BREAST XLRG (GAUZE/BANDAGES/DRESSINGS) IMPLANT
CANISTER SUCT 3000ML PPV (MISCELLANEOUS) ×1 IMPLANT
CHLORAPREP W/TINT 26 (MISCELLANEOUS) ×1 IMPLANT
CLIP APPLIE 9.375 MED OPEN (MISCELLANEOUS) ×1 IMPLANT
CNTNR URN SCR LID CUP LEK RST (MISCELLANEOUS) IMPLANT
CONT SPEC 4OZ STRL OR WHT (MISCELLANEOUS)
COVER PROBE W GEL 5X96 (DRAPES) ×1 IMPLANT
COVER SURGICAL LIGHT HANDLE (MISCELLANEOUS) ×1 IMPLANT
DERMABOND ADVANCED .7 DNX12 (GAUZE/BANDAGES/DRESSINGS) ×1 IMPLANT
DERMABOND ADVANCED .7 DNX6 (GAUZE/BANDAGES/DRESSINGS) IMPLANT
DEVICE DUBIN SPECIMEN MAMMOGRA (MISCELLANEOUS) ×1 IMPLANT
DRAPE CHEST BREAST 15X10 FENES (DRAPES) ×1 IMPLANT
ELECT CAUTERY BLADE 6.4 (BLADE) ×1 IMPLANT
ELECT REM PT RETURN 9FT ADLT (ELECTROSURGICAL) ×1
ELECTRODE REM PT RTRN 9FT ADLT (ELECTROSURGICAL) ×1 IMPLANT
GAUZE PAD ABD 8X10 STRL (GAUZE/BANDAGES/DRESSINGS) ×1 IMPLANT
GLOVE BIO SURGEON STRL SZ8 (GLOVE) ×1 IMPLANT
GLOVE BIOGEL PI IND STRL 8 (GLOVE) ×1 IMPLANT
GOWN STRL REUS W/ TWL LRG LVL3 (GOWN DISPOSABLE) ×1 IMPLANT
GOWN STRL REUS W/ TWL XL LVL3 (GOWN DISPOSABLE) ×1 IMPLANT
GOWN STRL REUS W/TWL LRG LVL3 (GOWN DISPOSABLE) ×1
GOWN STRL REUS W/TWL XL LVL3 (GOWN DISPOSABLE) ×1
HEMOSTAT ARISTA ABSORB 3G PWDR (HEMOSTASIS) IMPLANT
KIT BASIN OR (CUSTOM PROCEDURE TRAY) ×1 IMPLANT
KIT MARKER MARGIN INK (KITS) ×1 IMPLANT
LIGHT WAVEGUIDE WIDE FLAT (MISCELLANEOUS) IMPLANT
NDL 18GX1X1/2 (RX/OR ONLY) (NEEDLE) IMPLANT
NDL FILTER BLUNT 18X1 1/2 (NEEDLE) IMPLANT
NDL HYPO 25GX1X1/2 BEV (NEEDLE) ×1 IMPLANT
NEEDLE 18GX1X1/2 (RX/OR ONLY) (NEEDLE) IMPLANT
NEEDLE FILTER BLUNT 18X1 1/2 (NEEDLE) IMPLANT
NEEDLE HYPO 25GX1X1/2 BEV (NEEDLE) ×1 IMPLANT
NS IRRIG 1000ML POUR BTL (IV SOLUTION) ×1 IMPLANT
PACK GENERAL/GYN (CUSTOM PROCEDURE TRAY) ×1 IMPLANT
SUT MNCRL AB 4-0 PS2 18 (SUTURE) ×1 IMPLANT
SUT VIC AB 3-0 SH 18 (SUTURE) ×1 IMPLANT
SYR 3ML LL SCALE MARK (SYRINGE) IMPLANT
SYR CONTROL 10ML LL (SYRINGE) ×1 IMPLANT
TOWEL GREEN STERILE (TOWEL DISPOSABLE) ×1 IMPLANT
TOWEL GREEN STERILE FF (TOWEL DISPOSABLE) ×1 IMPLANT

## 2022-11-15 NOTE — Transfer of Care (Signed)
Immediate Anesthesia Transfer of Care Note  Patient: [name]  Procedure(s) Performed: LEFT BREAST LUMPECTOMY WITH RADIOACTIVE SEED AND SENTINEL LYMPH NODE BIOPSY (Left: Breast)  Patient Location: PACU  Anesthesia Type:General  Level of Consciousness: responds to stimulation  Airway & Oxygen Therapy: Patient Spontanous Breathing and Patient connected to nasal cannula oxygen  Post-op Assessment: Report given to RN and Post -op Vital signs reviewed and stable  Post vital signs: Reviewed and stable  Last Vitals:  Vitals Value Taken Time  BP 118/87 11/15/22 1538  Temp  99.7  Pulse 100 11/15/22 1542  Resp 10 11/15/22 1542  SpO2 96 % 11/15/22 1542  Vitals shown include unvalidated device data.  Last Pain:  Vitals:   11/15/22 1300  TempSrc:   PainSc: 0-No pain      Patients Stated Pain Goal: 0 (11/15/22 1128)  Complications: No notable events documented.

## 2022-11-15 NOTE — Discharge Instructions (Signed)
[facility] Office Phone Number [phone]  BREAST BIOPSY/ PARTIAL MASTECTOMY: POST OP INSTRUCTIONS  Always review your discharge instruction sheet given to you by the facility where your surgery was performed.  IF YOU HAVE DISABILITY OR FAMILY LEAVE FORMS, YOU MUST BRING THEM TO THE OFFICE FOR PROCESSING.  DO NOT GIVE THEM TO YOUR DOCTOR.  A prescription for pain medication may be given to you upon discharge.  Take your pain medication as prescribed, if needed.  If narcotic pain medicine is not needed, then you may take acetaminophen (Tylenol) or ibuprofen (Advil) as needed. Take your usually prescribed medications unless otherwise directed If you need a refill on your pain medication, please contact your pharmacy.  They will contact our office to request authorization.  Prescriptions will not be filled after 5pm or on week-ends. You should eat very light the first 24 hours after surgery, such as soup, crackers, pudding, etc.  Resume your normal diet the day after surgery. Most patients will experience some swelling and bruising in the breast.  Ice packs and a good support bra will help.  Swelling and bruising can take several days to resolve.  It is common to experience some constipation if taking pain medication after surgery.  Increasing fluid intake and taking a stool softener will usually help or prevent this problem from occurring.  A mild laxative (Milk of Magnesia or Miralax) should be taken according to package directions if there are no bowel movements after 48 hours. Unless discharge instructions indicate otherwise, you may remove your bandages 24-48 hours after surgery, and you may shower at that time.  You may have steri-strips (small skin tapes) in place directly over the incision.  These strips should be left on the skin for 7-10 days.  If your surgeon used skin glue on the incision, you may shower in 24 hours.  The glue will flake off over the next 2-3 weeks.  Any  sutures or staples will be removed at the office during your follow-up visit. ACTIVITIES:  You may resume regular daily activities (gradually increasing) beginning the next day.  Wearing a good support bra or sports bra minimizes pain and swelling.  You may have sexual intercourse when it is comfortable. You may drive when you no longer are taking prescription pain medication, you can comfortably wear a seatbelt, and you can safely maneuver your car and apply brakes. RETURN TO WORK:  ______________________________________________________________________________________ You should see your doctor in the office for a follow-up appointment approximately two weeks after your surgery.  Your doctor's nurse will typically make your follow-up appointment when she calls you with your pathology report.  Expect your pathology report 2-3 business days after your surgery.  You may call to check if you do not hear from us after three days. OTHER INSTRUCTIONS: _______________________________________________________________________________________________ _____________________________________________________________________________________________________________________________________ _____________________________________________________________________________________________________________________________________ _____________________________________________________________________________________________________________________________________  WHEN TO CALL YOUR DOCTOR: Fever over 101.0 Nausea and/or vomiting. Extreme swelling or bruising. Continued bleeding from incision. Increased pain, redness, or drainage from the incision.  The clinic staff is available to answer your questions during regular business hours.  Please don't hesitate to call and ask to speak to one of the nurses for clinical concerns.  If you have a medical emergency, go to the nearest emergency room or call 911.  A surgeon from [facility] is always on call at the hospital.  For further questions, please visit [email or web address]   

## 2022-11-15 NOTE — Anesthesia Procedure Notes (Signed)
Anesthesia Regional Block: Pectoralis block   Pre-Anesthetic Checklist: , timeout performed,  Correct Patient, Correct Site, Correct Laterality,  Correct Procedure, Correct Position, site marked,  Risks and benefits discussed,  Surgical consent,  Pre-op evaluation,  At surgeon's request and post-op pain management  Laterality: Left  Prep: chloraprep       Needles:  Injection technique: Single-shot      Additional Needles:   Procedures:,,,, ultrasound used (permanent image in chart),,    Narrative:  Start time: 11/15/2022 12:42 PM End time: 11/15/2022 12:47 PM Injection made incrementally with aspirations every 5 mL.  Performed by: Personally  Anesthesiologist: Mal Amabile, MD  Additional Notes: Timeout performed. Patient sedated. Relevant anatomy ID'd using [place]. Incremental 2-64ml injection of LA with frequent aspiration. Patient tolerated procedure well.

## 2022-11-15 NOTE — H&P (Signed)
History of Present Illness: Diana GaussJennifer D Kelly is a 47 y.o. female who is seen today as an office consultation for evaluation of New Consultation and Breast Cancer  Patient presents for evaluation of newly diagnosed left breast cancer. 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 She has been maintained on tamoxifen. She developed a new density in her left breast upper inner quadrant. This measured 7 mm. 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 The KIA growth rate was 5%. Patient [place] any history of breast pain breast mass or nipple discharge.  Review of Systems: A complete review of systems was obtained from the patient. I have reviewed this information and discussed as appropriate with the patient. See HPI as well for other ROS.    Medical History: Past Medical History: Diagnosis Date History of cancer Seizures (CMS-HCC)  There is no problem list on file for this patient.  Past Surgical History: Procedure Laterality Date ankle surgery SPINE SURGERY   Allergies Allergen Reactions Ciprofloxacin Unknown Metoclopramide Unknown Oseltamivir Unknown Sulfa (Sulfonamide Antibiotics) Unknown  Current Outpatient Medications on File Prior to Visit Medication Sig Dispense Refill losartan (COZAAR) 25 MG tablet Take 25 mg by mouth once daily omeprazole (PRILOSEC) 40 MG DR capsule Take 40 mg by mouth 2 (two) times daily tamoxifen (NOLVADEX) 20 MG tablet Take 20 mg by mouth once daily  No current facility-administered medications on file prior to visit.  Family History Problem Relation Age of Onset High blood pressure (Hypertension) Mother   Social History  Tobacco Use Smoking Status Every Day Types: Cigarettes Smokeless Tobacco Never   Social History  Socioeconomic History Marital status: Married Tobacco Use Smoking status: Every Day Types: Cigarettes Smokeless tobacco:  Never Substance and Sexual Activity Alcohol use: Yes Comment: social Drug use: Never  Objective:  Vitals: 10/01/22 0917 BP: 132/89 Pulse: 105 Temp: 36.7 C (98 F) SpO2: 96% Weight: 63.4 kg (139 lb 12.8 oz) Height: 160 cm (5\' 3" )  Body mass index is 24.76 kg/m.  Physical Exam Exam conducted with a chaperone present. HENT: Head: Normocephalic. Eyes: Pupils: Pupils are equal, round, and reactive to light. Pulmonary: Effort: Pulmonary effort is normal. Chest: Breasts: Right: No inverted nipple or mass. Left: Normal. No inverted nipple or mass.  Comments: Scar right breast noted. No masses. Musculoskeletal: General: Normal range of motion. Lymphadenopathy: Upper Body: Right upper body: No supraclavicular or axillary adenopathy. Left upper body: No supraclavicular or axillary adenopathy. Skin: General: Skin is warm. Neurological: General: No focal deficit present. Mental Status: She is alert. Psychiatric: Mood and Affect: Mood normal. Behavior: Behavior normal.    Labs, Imaging and Diagnostic Testing:  Diagnosis Breast, left, needle core biopsy, LIQ (cylinder shaped clip) - INVASIVE DUCTAL CARCINOMA, SEE NOTE - TUBULE FORMATION: SCORE 1 - NUCLEAR PLEOMORPHISM: SCORE 2 - MITOTIC COUNT: SCORE 1 - TOTAL SCORE: 4 - OVERALL GRADE: 1 - LYMPHOVASCULAR INVASION: NOT IDENTIFIED - CANCER LENGTH: 0.2 CM - CALCIFICATIONS: NOT IDENTIFIED - OTHER FINDINGS: NONE Diagnosis Note Dr. Kenyon AnaLeGolvan reviewed the case and concurs with the above diagnosis. A breast prognostic profile (ER, PR, Ki-67 and HER2) is pending and will be reported in an addendum. [facility] was notified on 09/25/2022. Holley BoucheNilesh Kashikar MD Pathologist, Electronic Signature (Case signed 09/25/2022) Specimen Gross and Clinical Information Specimen Comment TIF: 8:20 AM, CIT < 5 min; mass Specimen(s) Obtained: Breast, left, needle core biopsy, LIQ (cylinder shaped clip) 1 of  3 FINAL for  Diana, [name] [phone]) Specimen Clinical Information R/O [facility] Gross Received in formalin labeled with the patient's name and "left lower inner" are multiple fragments of fibroadipose tissue measuring 2.5 x 2.0 x 0.2 cm in aggregate. The specimen is entirely submitted in two blocks. TIF 8:20 a.m. CIT less than 5 mins. (KW:gt, 09/24/22) Stain(s) used in Diagnosis: The following stain(s) were used in diagnosing the case: Her2 FISH, PR-ACIS, Her2 by IHC, KI-67-ACIS, ER-ACIS. The control(s) stained appropriately. ADDITIONAL INFORMATION: Breast,left, needle core biopsy FLUORESCENCE IN-SITU HYBRIDIZATION Results: GROUP 5: HER2 **NEGATIVE** Equivocal form of amplification of the HER2 gene was detected in the IHC 2+ tissue sample received from this individual. HER2 FISH was performed by a technologist and cell imaging and analysis on the BioView. RATIO OF HER2/CEN17 SIGNALS 1.59 AVERAGE HER2 COPY NUMBER PER CELL 2.55 The ratio of HER2/CEN 17 is within the range < 2.0 of HER2/CEN 17 and a copy number of HER2 signals per cell is <4.0. Arch Pathol Lab Med 1:1,2018 Marlena Clipper MD Pathologist, Electronic Signature ( Signed 09/28/2022) Breast, left, needle core biopsy, LIQ (cylinder shaped clip) PROGNOSTIC INDICATORS Results: IMMUNOHISTOCHEMICAL AND MORPHOMETRIC ANALYSIS PERFORMED MANUALLY The tumor cells are equivocal for Her2 (2+). Her2 by FISH will be performed and the results reported separately. Estrogen Receptor: 100%, POSITIVE, MODERATE STAINING INTENSITY Progesterone Receptor: 30%, POSITIVE, MODERATE STAINING INTENSITY Proliferation Marker Ki67: 5% REFERENCE RANGE ESTROGEN RECEPTOR NEGATIVE 0% POSITIVE =>1% 2 of 3 FINAL for CHEZNEY, ALTEMUS ([id number]) ADDITIONAL INFORMATION:(continued) REFERENCE RANGE PROGESTERONE RECEPTOR NEGATIVE 0% POSITIVE =>1% All controls stained appropriately Jimmy Picket MD Pathologist, Electronic Signature ( Signed  09/26/2022)  CLINICAL DATA: 47 year old female for high lifetime risk for developing breast cancer. Personal history of RIGHT breast cancer, RIGHT lumpectomy and radiation in 2020.  EXAM: BILATERAL BREAST MRI WITH AND WITHOUT CONTRAST  TECHNIQUE: Multiplanar, multisequence MR images of both breasts were obtained prior to and following the intravenous administration of 7 ml of Vueway  Three-dimensional MR images were rendered by post-processing of the original MR data on an independent workstation. The three-dimensional MR images were interpreted, and findings are reported in the following complete MRI report for this study. Three dimensional images were evaluated at the independent interpreting workstation using the DynaCAD thin client.  COMPARISON: Prior mammograms. 05/05/2020 and 10/04/2018 MRs.  FINDINGS: Breast composition: c. Heterogeneous fibroglandular tissue.  Background parenchymal enhancement: Mild  Right breast: No suspicious mass or worrisome enhancement. Lumpectomy changes are noted.  Left breast: A new 0.5 x 0.7 cm mass within the posterior LOWER INNER LEFT breast (image 99: Series 12) is identified.  No other suspicious mass or worrisome enhancement noted.  Biopsy clip artifact within the UPPER OUTER LEFT breast identified.  Lymph nodes: No abnormal appearing lymph nodes.  Ancillary findings: None.  IMPRESSION: 1. New indeterminate 0.7 cm LOWER INNER LEFT breast mass. Tissue sampling is recommended. 2. No abnormal appearing lymph nodes. 3. RIGHT lumpectomy changes.  RECOMMENDATION: MR guided LEFT breast biopsy.  BI-RADS CATEGORY 4: Suspicious.   Electronically Signed By: Harmon Pier M.D. On: 09/12/2022 12:50   Assessment and Plan:  Diagnoses and all orders for this visit:  Malignant neoplasm of upper-inner quadrant of left breast in female, estrogen receptor positive   Stage I left breast cancer ER positive upper and quadrant  Pt opted  for breast conserving surgery    The procedure has been discussed with the patient. Alternatives to surgery have been discussed with the patient.  Risks of surgery include bleeding,  Infection,  Seroma formation,  death,  and the need for further surgery.   The patient understands and wishes to proceed. Sentinel lymph node mapping and dissection has been discussed with the patient.  Risk of bleeding,  Infection,  Seroma formation,  Additional procedures,,  Shoulder weakness ,  arm swelling, Shoulder stiffness,  Nerve and blood vessel injury and reaction to the mapping dyes have been discussed.  Alternatives to surgery have been discussed with the patient.  The patient agrees to proceed.

## 2022-11-15 NOTE — Op Note (Signed)
Preoperative diagnosis: Stage I left breast cancer lower inner quadrant ER positive  Postoperative diagnosis: Same  Procedure: Left breast seed localized lumpectomy with deep left axillary sentinel lymph node mapping using mag trace  Surgeon: Harriette Bouillon, MD  Anesthesia: LMA with left pectoral block and 0.25% Marcaine plain  EBL: Minimal  Specimen: Left breast mass to include the skin as well as 3 left axillary sentinel nodes  Drains: None  Indications for procedure: The patient is a 47 year old female with stage I left breast cancer.  She has been seen in the multidisciplinary clinic setting and operative options were presented.  She opted for breast conserving surgery on the left.  Risk, benefits and long-term expectations as well as survival local regional recurrence data were reviewed with the patient.The procedure has been discussed with the patient. Alternatives to surgery have been discussed with the patient.  Risks of surgery include bleeding,  Infection,  Seroma formation, death,  and the need for further surgery.   The patient understands and wishes to proceed. Sentinel lymph node mapping and dissection has been discussed with the patient.  Risk of bleeding,  Infection,  Seroma formation,  Additional procedures,,  Shoulder weakness , lymphedema shoulder stiffness,  Nerve and blood vessel injury and reaction to the mapping dyes have been discussed.  Alternatives to surgery have been discussed with the patient.  The patient agrees to proceed.   Description of procedure: The patient was met in the holding area and questions were answered.  Left breast was marked as correct site and seeds were placed in the outpatient setting.  Left pectoral block was obtained.  She was then taken back to the operating.  She is placed supine upon the OR table.  After induction of general anesthesia, left breast was prepped with alcohol and 2 cc of MAC tracer injected in the subareolar plexus and left and  massaged.  She was then prepped and draped in a sterile fashion.  A second timeout was performed.  Neoprobe was used to identify the seeds in the left lower inner quadrant near the inframammary fold.  Incision was made along the inframammary fold medially.  I opted to excise the skin with it since the tissue was so thin.  An ellipse of skin was taken with the specimen en bloc.  Radiograph revealed seed and clip to be present.  This was irrigated.  There is no gross disease that I could see left behind.  Deep tissue planes were closed with 3-0 Vicryl.  4-0 Monocryl used to close the skin.  Mag trace probe used to identify hotspot in left axilla.  Local anesthetic infiltrated and incision made along the inferior border of the axillary hairline.  Dissection was carried down through the subcutaneous fat until we are in the deep left axillary space.  I detected 3 nodes that had taken up the tracer.  Background counts showed no significant spike in activity.  Long thoracic nerve, thoracodorsal trunk and axillary vein were preserved.  Hemostasis achieved with cautery and clips.  We irrigated.  Local anesthetic infiltrated.  Arista placed.  There was good hemostasis.  We then closed the deep tissue layers with 3-0 Vicryl.  4 Monocryl was used to close the skin in a subcuticular fashion.  Dermabond applied.  All counts were found to be correct.  Breast binder placed.  The patient was awoke extubated taken recovery in satisfactory condition.

## 2022-11-15 NOTE — Anesthesia Procedure Notes (Signed)
Procedure Name: LMA Insertion Date/Time: 11/15/2022 2:37 PM  Performed by: Loleta Webber Michiels, CRNAPre-anesthesia Checklist: Patient identified, Patient being monitored, Timeout performed, Emergency Drugs available and Suction available Patient Re-evaluated:Patient Re-evaluated prior to induction Oxygen Delivery Method: Circle system utilized Preoxygenation: Pre-oxygenation with 100% oxygen Induction Type: IV induction Ventilation: Mask ventilation without difficulty LMA: LMA inserted Tube type: Oral Number of attempts: 2 Placement Confirmation: positive ETCO2 and breath sounds checked- equal and bilateral Tube secured with: Tape Dental Injury: Teeth and Oropharynx as per pre-operative assessment

## 2022-11-15 NOTE — Anesthesia Postprocedure Evaluation (Signed)
Anesthesia Post Note  Patient: [name]  Procedure(s) Performed: LEFT BREAST LUMPECTOMY WITH RADIOACTIVE SEED AND SENTINEL LYMPH NODE BIOPSY (Left: Breast)     Patient location during evaluation: PACU Anesthesia Type: General Level of consciousness: awake and alert, patient cooperative and oriented Pain management: pain level controlled Vital Signs Assessment: post-procedure vital signs reviewed and stable Respiratory status: spontaneous breathing, nonlabored ventilation and respiratory function stable Cardiovascular status: blood pressure returned to baseline and stable Postop Assessment: no apparent nausea or vomiting and adequate PO intake Anesthetic complications: no   No notable events documented.  Last Vitals:  Vitals:   11/15/22 1630 11/15/22 1632  BP: (!) 119/91   Pulse: 94 87  Resp: 13 16  Temp:  36.7 C  SpO2: 90% 98%    Last Pain:  Vitals:   11/15/22 1539  TempSrc:   PainSc: 0-No pain                 Seven [place],E. Jamy Cleckler

## 2022-11-15 NOTE — Interval H&P Note (Signed)
History and Physical Interval Note:  11/15/2022 1:43 PM  [name]  has presented today for surgery, with the diagnosis of LEFT BREAST CANCER.  The various methods of treatment have been discussed with the patient and family. After consideration of risks, benefits and other options for treatment, the patient has consented to  Procedure(s): LEFT BREAST LUMPECTOMY WITH RADIOACTIVE SEED AND SENTINEL LYMPH NODE BIOPSY (Left) as a surgical intervention.  The patient's history has been reviewed, patient examined, no change in status, stable for surgery.  I have reviewed the patient's chart and labs.  Questions were answered to the patient's satisfaction.     Aizik Reh A Inez Rosato

## 2022-11-16 ENCOUNTER — Encounter (HOSPITAL_COMMUNITY): Payer: Self-pay | Admitting: Surgery

## 2022-11-19 ENCOUNTER — Telehealth: Payer: Self-pay

## 2022-11-19 ENCOUNTER — Emergency Department (HOSPITAL_COMMUNITY): Payer: 59 | Admitting: Certified Registered Nurse Anesthetist

## 2022-11-19 ENCOUNTER — Emergency Department (HOSPITAL_BASED_OUTPATIENT_CLINIC_OR_DEPARTMENT_OTHER): Payer: 59 | Admitting: Certified Registered Nurse Anesthetist

## 2022-11-19 ENCOUNTER — Encounter (HOSPITAL_COMMUNITY)
Admission: EM | Disposition: A | Discharge status: Home / Self Care | Payer: Self-pay | Source: Home / Self Care | Attending: Emergency Medicine

## 2022-11-19 ENCOUNTER — Other Ambulatory Visit: Payer: Self-pay

## 2022-11-19 ENCOUNTER — Observation Stay (HOSPITAL_COMMUNITY)
Admission: EM | Admit: 2022-11-19 | Discharge: 2022-11-20 | Disposition: A | Discharge status: Home / Self Care | Payer: 59 | Attending: Surgery | Admitting: Surgery

## 2022-11-19 DIAGNOSIS — Z9889 Other specified postprocedural states: Secondary | ICD-10-CM

## 2022-11-19 DIAGNOSIS — T148XXA Other injury of unspecified body region, initial encounter: Secondary | ICD-10-CM

## 2022-11-19 DIAGNOSIS — S40022A Contusion of left upper arm, initial encounter: Principal | ICD-10-CM

## 2022-11-19 DIAGNOSIS — Z87891 Personal history of nicotine dependence: Secondary | ICD-10-CM | POA: Diagnosis not present

## 2022-11-19 DIAGNOSIS — I10 Essential (primary) hypertension: Secondary | ICD-10-CM | POA: Insufficient documentation

## 2022-11-19 DIAGNOSIS — C50912 Malignant neoplasm of unspecified site of left female breast: Secondary | ICD-10-CM | POA: Diagnosis present

## 2022-11-19 DIAGNOSIS — Y838 Other surgical procedures as the cause of abnormal reaction of the patient, or of later complication, without mention of misadventure at the time of the procedure: Secondary | ICD-10-CM | POA: Diagnosis not present

## 2022-11-19 DIAGNOSIS — Z853 Personal history of malignant neoplasm of breast: Secondary | ICD-10-CM | POA: Diagnosis not present

## 2022-11-19 HISTORY — PX: IRRIGATION AND DEBRIDEMENT ABSCESS: SHX5252

## 2022-11-19 LAB — COMPREHENSIVE METABOLIC PANEL
ALT: 51 U/L — ABNORMAL HIGH (ref 0–44)
AST: 178 U/L — ABNORMAL HIGH (ref 15–41)
Albumin: 4.2 g/dL (ref 3.5–5.0)
Alkaline Phosphatase: 52 U/L (ref 38–126)
Anion gap: 13 (ref 5–15)
BUN: 7 mg/dL (ref 6–20)
CO2: 23 mmol/L (ref 22–32)
Calcium: 9 mg/dL (ref 8.9–10.3)
Chloride: 99 mmol/L (ref 98–111)
Creatinine, Ser: 0.63 mg/dL (ref 0.44–1.00)
GFR, Estimated: >60 mL/min (ref >60)
Glucose, Bld: 197 mg/dL — ABNORMAL HIGH (ref 70–99)
Potassium: 3.2 mmol/L — ABNORMAL LOW (ref 3.5–5.1)
Sodium: 135 mmol/L (ref 135–145)
Total Bilirubin: 1.7 mg/dL — ABNORMAL HIGH (ref 0.3–1.2)
Total Protein: 7.8 g/dL (ref 6.5–8.1)

## 2022-11-19 LAB — LACTIC ACID, PLASMA: Lactic Acid, Venous: 2.9 mmol/L (ref 0.5–1.9)

## 2022-11-19 LAB — CBC WITH DIFFERENTIAL/PLATELET
Abs Immature Granulocytes: 0.09 10*3/uL — ABNORMAL HIGH (ref 0.00–0.07)
Basophils Absolute: 0.1 10*3/uL (ref 0.0–0.1)
Basophils Relative: 1 %
Eosinophils Absolute: 0 10*3/uL (ref 0.0–0.5)
Eosinophils Relative: 1 %
HCT: 35.3 % — ABNORMAL LOW (ref 36.0–46.0)
Hemoglobin: 12.3 g/dL (ref 12.0–15.0)
Immature Granulocytes: 1 %
Lymphocytes Relative: 11 %
Lymphs Abs: 0.7 10*3/uL (ref 0.7–4.0)
MCH: 41.6 pg — ABNORMAL HIGH (ref 26.0–34.0)
MCHC: 34.8 g/dL (ref 30.0–36.0)
MCV: 119.3 fL — ABNORMAL HIGH (ref 80.0–100.0)
Monocytes Absolute: 0.4 10*3/uL (ref 0.1–1.0)
Monocytes Relative: 7 %
Neutro Abs: 5.1 10*3/uL (ref 1.7–7.7)
Neutrophils Relative %: 79 %
Platelets: 207 10*3/uL (ref 150–400)
RBC: 2.96 MIL/uL — ABNORMAL LOW (ref 3.87–5.11)
RDW: 12.5 % (ref 11.5–15.5)
WBC: 6.4 10*3/uL (ref 4.0–10.5)
nRBC: 0 % (ref 0.0–0.2)

## 2022-11-19 SURGERY — IRRIGATION AND DEBRIDEMENT ABSCESS
Anesthesia: General | Laterality: Left

## 2022-11-19 MED ORDER — PROPOFOL 10 MG/ML IV BOLUS
INTRAVENOUS | Status: AC
  Filled 2022-11-19: qty 20

## 2022-11-19 MED ORDER — EPHEDRINE 5 MG/ML INJ
INTRAVENOUS | Status: AC
  Filled 2022-11-19: qty 5

## 2022-11-19 MED ORDER — LACTATED RINGERS IV SOLN: INTRAVENOUS | Status: DC | PRN

## 2022-11-19 MED ORDER — BUPIVACAINE HCL (PF) 0.25 % IJ SOLN
INTRAMUSCULAR | Status: DC | PRN
  Administered 2022-11-19: 20 mL

## 2022-11-19 MED ORDER — FENTANYL CITRATE (PF) 250 MCG/5ML IJ SOLN
INTRAMUSCULAR | Status: AC
  Filled 2022-11-19: qty 5

## 2022-11-19 MED ORDER — LIDOCAINE 2% (20 MG/ML) 5 ML SYRINGE
INTRAMUSCULAR | Status: AC
  Filled 2022-11-19: qty 5

## 2022-11-19 MED ORDER — PROPOFOL 10 MG/ML IV BOLUS
INTRAVENOUS | Status: DC | PRN
  Administered 2022-11-19: 150 mg via INTRAVENOUS

## 2022-11-19 MED ORDER — FENTANYL CITRATE (PF) 250 MCG/5ML IJ SOLN
INTRAMUSCULAR | Status: DC | PRN
  Administered 2022-11-19: 150 ug via INTRAVENOUS

## 2022-11-19 MED ORDER — PROMETHAZINE HCL 25 MG/ML IJ SOLN: 6.2500 mg | INTRAMUSCULAR | Status: DC | PRN

## 2022-11-19 MED ORDER — MIDAZOLAM HCL 2 MG/2ML IJ SOLN
INTRAMUSCULAR | Status: AC
  Filled 2022-11-19: qty 2

## 2022-11-19 MED ORDER — OXYCODONE HCL 5 MG/5ML PO SOLN: 5.0000 mg | Freq: Once | ORAL | Status: DC | PRN

## 2022-11-19 MED ORDER — AMISULPRIDE (ANTIEMETIC) 5 MG/2ML IV SOLN
10.0000 mg | Freq: Once | INTRAVENOUS | Status: DC | PRN
Start: 2022-11-19 — End: 2022-11-19

## 2022-11-19 MED ORDER — DIPHENHYDRAMINE HCL 50 MG/ML IJ SOLN: 25.0000 mg | Freq: Four times a day (QID) | INTRAMUSCULAR | Status: DC | PRN

## 2022-11-19 MED ORDER — ONDANSETRON HCL 4 MG/2ML IJ SOLN
INTRAMUSCULAR | Status: DC | PRN
  Administered 2022-11-19: 4 mg via INTRAVENOUS

## 2022-11-19 MED ORDER — ACETAMINOPHEN 10 MG/ML IV SOLN: 1000.0000 mg | Freq: Once | INTRAVENOUS | Status: DC | PRN

## 2022-11-19 MED ORDER — ROCURONIUM BROMIDE 10 MG/ML (PF) SYRINGE
PREFILLED_SYRINGE | INTRAVENOUS | Status: DC | PRN
  Administered 2022-11-19: 40 mg via INTRAVENOUS

## 2022-11-19 MED ORDER — METHOCARBAMOL 500 MG PO TABS
500.0000 mg | ORAL_TABLET | Freq: Four times a day (QID) | ORAL | Status: DC
  Administered 2022-11-19 – 2022-11-20 (×2): 500 mg via ORAL
  Filled 2022-11-19 (×2): qty 1

## 2022-11-19 MED ORDER — CEFAZOLIN SODIUM-DEXTROSE 2-4 GM/100ML-% IV SOLN
2.0000 g | INTRAVENOUS | Status: AC
  Administered 2022-11-19: 2 g via INTRAVENOUS

## 2022-11-19 MED ORDER — LACTATED RINGERS IV BOLUS
1000.0000 mL | Freq: Once | INTRAVENOUS | Status: AC
  Administered 2022-11-19: 1000 mL via INTRAVENOUS

## 2022-11-19 MED ORDER — 0.9 % SODIUM CHLORIDE (POUR BTL) OPTIME
TOPICAL | Status: DC | PRN
  Administered 2022-11-19: 1000 mL

## 2022-11-19 MED ORDER — ONDANSETRON HCL 4 MG/2ML IJ SOLN: 4.0000 mg | Freq: Four times a day (QID) | INTRAMUSCULAR | Status: DC | PRN

## 2022-11-19 MED ORDER — SUGAMMADEX SODIUM 200 MG/2ML IV SOLN
INTRAVENOUS | Status: DC | PRN
  Administered 2022-11-19 (×2): 100 mg via INTRAVENOUS

## 2022-11-19 MED ORDER — MIDAZOLAM HCL 2 MG/2ML IJ SOLN
INTRAMUSCULAR | Status: DC | PRN
  Administered 2022-11-19: 2 mg via INTRAVENOUS

## 2022-11-19 MED ORDER — POTASSIUM CHLORIDE CRYS ER 20 MEQ PO TBCR
40.0000 meq | EXTENDED_RELEASE_TABLET | Freq: Once | ORAL | Status: AC
  Administered 2022-11-19: 40 meq via ORAL
  Filled 2022-11-19: qty 2

## 2022-11-19 MED ORDER — DIPHENHYDRAMINE HCL 25 MG PO CAPS: 25.0000 mg | ORAL_CAPSULE | Freq: Four times a day (QID) | ORAL | Status: DC | PRN

## 2022-11-19 MED ORDER — ONDANSETRON 4 MG PO TBDP
4.0000 mg | ORAL_TABLET | Freq: Once | ORAL | Status: AC
  Administered 2022-11-19: 4 mg via ORAL
  Filled 2022-11-19: qty 1

## 2022-11-19 MED ORDER — PANTOPRAZOLE SODIUM 40 MG PO TBEC
40.0000 mg | DELAYED_RELEASE_TABLET | Freq: Every day | ORAL | Status: DC
  Administered 2022-11-19: 40 mg via ORAL
  Filled 2022-11-19: qty 1

## 2022-11-19 MED ORDER — ROCURONIUM BROMIDE 10 MG/ML (PF) SYRINGE
PREFILLED_SYRINGE | INTRAVENOUS | Status: AC
  Filled 2022-11-19: qty 10

## 2022-11-19 MED ORDER — CHLORHEXIDINE GLUCONATE CLOTH 2 % EX PADS: 6.0000 | MEDICATED_PAD | Freq: Once | CUTANEOUS | Status: DC

## 2022-11-19 MED ORDER — DOCUSATE SODIUM 100 MG PO CAPS
100.0000 mg | ORAL_CAPSULE | Freq: Two times a day (BID) | ORAL | Status: DC
  Administered 2022-11-19 – 2022-11-20 (×2): 100 mg via ORAL
  Filled 2022-11-19 (×2): qty 1

## 2022-11-19 MED ORDER — OXYCODONE HCL 5 MG PO TABS
5.0000 mg | ORAL_TABLET | Freq: Once | ORAL | Status: DC | PRN
Start: 2022-11-19 — End: 2022-11-19

## 2022-11-19 MED ORDER — ONDANSETRON 4 MG PO TBDP: 4.0000 mg | ORAL_TABLET | Freq: Four times a day (QID) | ORAL | Status: DC | PRN

## 2022-11-19 MED ORDER — OXYCODONE HCL 5 MG PO TABS
5.0000 mg | ORAL_TABLET | ORAL | Status: DC | PRN
  Administered 2022-11-19 – 2022-11-20 (×2): 5 mg via ORAL
  Filled 2022-11-19 (×2): qty 1

## 2022-11-19 MED ORDER — ACETAMINOPHEN 500 MG PO TABS
1000.0000 mg | ORAL_TABLET | Freq: Four times a day (QID) | ORAL | Status: DC
  Administered 2022-11-20: 1000 mg via ORAL
  Filled 2022-11-19: qty 2

## 2022-11-19 MED ORDER — DEXAMETHASONE SODIUM PHOSPHATE 10 MG/ML IJ SOLN
INTRAMUSCULAR | Status: DC | PRN
  Administered 2022-11-19: 5 mg via INTRAVENOUS

## 2022-11-19 MED ORDER — ONDANSETRON HCL 4 MG/2ML IJ SOLN
INTRAMUSCULAR | Status: AC
  Filled 2022-11-19: qty 4

## 2022-11-19 MED ORDER — LIDOCAINE 2% (20 MG/ML) 5 ML SYRINGE
INTRAMUSCULAR | Status: DC | PRN
  Administered 2022-11-19: 60 mg via INTRAVENOUS

## 2022-11-19 MED ORDER — BUPIVACAINE HCL (PF) 0.25 % IJ SOLN
INTRAMUSCULAR | Status: AC
  Filled 2022-11-19: qty 30

## 2022-11-19 MED ORDER — DEXAMETHASONE SODIUM PHOSPHATE 10 MG/ML IJ SOLN
INTRAMUSCULAR | Status: AC
  Filled 2022-11-19: qty 2

## 2022-11-19 MED ORDER — FENTANYL CITRATE (PF) 100 MCG/2ML IJ SOLN: 25.0000 ug | INTRAMUSCULAR | Status: DC | PRN

## 2022-11-19 SURGICAL SUPPLY — 46 items
ADH SKN CLS APL DERMABOND .7 (GAUZE/BANDAGES/DRESSINGS) ×1
APL PRP STRL LF DISP 70% ISPRP (MISCELLANEOUS) ×1
BAG COUNTER SPONGE SURGICOUNT (BAG) ×1 IMPLANT
BAG SPNG CNTER NS LX DISP (BAG) ×1
BINDER ABDOMINAL 12 XL 75-84 (SOFTGOODS) IMPLANT
BINDER BREAST MEDIUM (GAUZE/BANDAGES/DRESSINGS) IMPLANT
CANISTER SUCT 3000ML PPV (MISCELLANEOUS) ×1 IMPLANT
CHLORAPREP W/TINT 26 (MISCELLANEOUS) ×1 IMPLANT
COVER SURGICAL LIGHT HANDLE (MISCELLANEOUS) ×1 IMPLANT
DERMABOND ADVANCED .7 DNX12 (GAUZE/BANDAGES/DRESSINGS) ×1 IMPLANT
DRAIN JP 15F RND RADIO PRF (DRAIN) IMPLANT
DRAPE LAPAROSCOPIC ABDOMINAL (DRAPES) IMPLANT
DRAPE LAPAROTOMY 100X72 PEDS (DRAPES) IMPLANT
DRSG TEGADERM 4X4.75 (GAUZE/BANDAGES/DRESSINGS) IMPLANT
ELECT REM PT RETURN 9FT ADLT (ELECTROSURGICAL) ×1
ELECTRODE REM PT RTRN 9FT ADLT (ELECTROSURGICAL) ×1 IMPLANT
EVACUATOR SILICONE 100CC (DRAIN) IMPLANT
GAUZE 4X4 16PLY ~~LOC~~+RFID DBL (SPONGE) IMPLANT
GAUZE SPONGE 4X4 12PLY STRL (GAUZE/BANDAGES/DRESSINGS) IMPLANT
GAUZE SPONGE 4X4 12PLY STRL LF (GAUZE/BANDAGES/DRESSINGS) IMPLANT
GLOVE BIOGEL PI IND STRL 6 (GLOVE) ×1 IMPLANT
GLOVE BIOGEL PI MICRO STRL 5.5 (GLOVE) ×1 IMPLANT
GOWN STRL REUS W/ TWL LRG LVL3 (GOWN DISPOSABLE) ×1 IMPLANT
GOWN STRL REUS W/TWL LRG LVL3 (GOWN DISPOSABLE) ×1
KIT BASIN OR (CUSTOM PROCEDURE TRAY) ×1 IMPLANT
KIT TURNOVER KIT B (KITS) ×1 IMPLANT
MARKER SKIN DUAL TIP RULER LAB (MISCELLANEOUS) ×1 IMPLANT
NDL HYPO 25GX1X1/2 BEV (NEEDLE) ×1 IMPLANT
NEEDLE HYPO 25GX1X1/2 BEV (NEEDLE) ×1 IMPLANT
NS IRRIG 1000ML POUR BTL (IV SOLUTION) ×1 IMPLANT
PACK GENERAL/GYN (CUSTOM PROCEDURE TRAY) ×1 IMPLANT
PAD ARMBOARD 7.5X6 YLW CONV (MISCELLANEOUS) ×2 IMPLANT
PENCIL SMOKE EVACUATOR (MISCELLANEOUS) ×1 IMPLANT
SPECIMEN JAR SMALL (MISCELLANEOUS) ×1 IMPLANT
SPONGE T-LAP 4X18 ~~LOC~~+RFID (SPONGE) IMPLANT
STRIP CLOSURE SKIN 1/2X4 (GAUZE/BANDAGES/DRESSINGS) IMPLANT
SUT ETHILON 2 0 FS 18 (SUTURE) IMPLANT
SUT MNCRL AB 4-0 PS2 18 (SUTURE) IMPLANT
SUT MON AB 4-0 PC3 18 (SUTURE) ×1 IMPLANT
SUT SILK 2 0 PERMA HAND 18 BK (SUTURE) IMPLANT
SUT VIC AB 3-0 SH 18 (SUTURE) IMPLANT
SUT VIC AB 3-0 SH 27 (SUTURE) ×1
SUT VIC AB 3-0 SH 27X BRD (SUTURE) ×1 IMPLANT
SYR CONTROL 10ML LL (SYRINGE) ×1 IMPLANT
TOWEL GREEN STERILE (TOWEL DISPOSABLE) ×1 IMPLANT
TOWEL GREEN STERILE FF (TOWEL DISPOSABLE) ×1 IMPLANT

## 2022-11-19 NOTE — H&P (Signed)
[name] [facility] 08-17-1975  [id number].     HPI:  Ms. Dysinger is a 47 yo female with invasive ductal carcinoma of the left breast, for which she underwent a left breast lumpectomy and left axillary sentinel lymph node biopsy on 4/11 by Dr. Luisa Hart. She was feeling well until this morning after she arrived at work. She started having pain under her left arm, and swelling up to the size of a baseball. She also reports associated light-headedness, nausea and vomiting. She has been wearing a compression bra since surgery, and says the swelling in the left axilla has improved since it began this morning. She is still having significant pain, which limits movement and use of her arm. She has been mildly tachycardic in the ED and normotensive. Hgb is 12, and lactate is mildly elevated. General surgery was consulted for further evaluation. She has been NPO since 10am this morning.  ROS: ROS  Family History  Problem Relation Age of Onset   Lupus Mother    Breast cancer Maternal Aunt    Anesthesia problems Neg Hx    Colon cancer Neg Hx    Colon polyps Neg Hx    Esophageal cancer Neg Hx    Rectal cancer Neg Hx    Stomach cancer Neg Hx     Past Medical History:  Diagnosis Date   Anemia    Back pain    Bimalleolar ankle fracture    left   Breast cancer    right breast- 2020   Breast cancer 09/2022   left breast   Family history of breast cancer    GERD (gastroesophageal reflux disease)    Hypertension    no meds   Kidney infection    1998- hosp.[facility]   Personal history of radiation therapy    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, more than 10 years ago- no instances since    Past Surgical History:  Procedure Laterality Date   ANTERIOR LUMBAR FUSION  11/14/2011   Procedure: ANTERIOR LUMBAR FUSION 2 LEVELS;  Surgeon: Javier Docker, MD;  Location: [facility] OR;  Service: Orthopedics;  Laterality: N/A;  ALIF L5-S1   BACK SURGERY     BREAST BIOPSY  11/13/2022   MM LT RADIOACTIVE SEED LOC MAMMO  GUIDE 11/13/2022 [facility] MAMMOGRAPHY   BREAST LUMPECTOMY Right 01/2019   BREAST LUMPECTOMY WITH RADIOACTIVE SEED AND SENTINEL LYMPH NODE BIOPSY Left 11/15/2022   Procedure: LEFT BREAST LUMPECTOMY WITH RADIOACTIVE SEED AND SENTINEL LYMPH NODE BIOPSY;  Surgeon: Harriette Bouillon, MD;  Location: [facility] OR;  Service: General;  Laterality: Left;   BREAST LUMPECTOMY WITH RADIOACTIVE SEED LOCALIZATION Right 01/13/2019   Procedure: RIGHT BREAST LUMPECTOMY WITH RADIOACTIVE SEED LOCALIZATION X3;  Surgeon: Harriette Bouillon, MD;  Location: [facility];  Service: General;  Laterality: Right;   CESAREAN SECTION     2007- /w epidural  anesthesia    COLONOSCOPY     LUMBAR LAMINECTOMY/DECOMPRESSION MICRODISCECTOMY Right 12/15/2015   Procedure:  MICO LUMBER DECOMPRESSION L4-5 ON THE RIGHT, ;  Surgeon: Jene Every, MD;  Location: [facility];  Service: Orthopedics;  Laterality: Right;   ORIF ANKLE FRACTURE Left 10/03/2017   Procedure: OPEN REDUCTION INTERNAL FIXATION (ORIF) ANKLE BIMALLEOLAR FRACTURE; possible deltoid ligament repair;  Surgeon: Toni Arthurs, MD;  Location: [facility];  Service: Orthopedics;  Laterality: Left;   SPINE SURGERY     fusion   UPPER GASTROINTESTINAL ENDOSCOPY     WISDOM TOOTH EXTRACTION     young adult  Social History:  reports that she quit smoking about 5 years ago. Her smoking use included cigarettes. She has a 3.75 pack-year smoking history. She has never used smokeless tobacco. She reports current alcohol use of about 1.0 standard drink of alcohol per week. She reports that she does not use drugs.  Allergies:  Allergies  Allergen Reactions   Tamiflu [Oseltamivir Phosphate] Other (See Comments)    Seizure   Ciprofloxacin Diarrhea and Nausea And Vomiting   Metoclopramide Hcl Other (See Comments)    "lock jaw"   Sulfa Antibiotics Nausea And Vomiting    (Not in a hospital admission)    Physical Exam: Blood pressure 103/80, pulse (!) 102, temperature 98.3  F (36.8 C), temperature source Oral, resp. rate 16, SpO2 99 %. General: resting comfortably, appears stated age, no apparent distress Neurological: alert and oriented, no focal deficits, cranial nerves grossly in tact HEENT: normocephalic, atraumatic CV: mild tachycardia, regular Respiratory: normal work of breathing on room air Breast: Incision on left medial breast is clean and dry with no erythema or induration. Left axillary incision has surrounding ecchymosis and underlying swelling and induration, with tenderness to palpation, consistent with a large hematoma. There is no erythema or fluctuance. Extremities: warm and well-perfused    Results for orders placed or performed during the hospital encounter of 11/19/22 (from the past 48 hour(s))  Lactic acid, plasma     Status: Abnormal   Collection Time: 11/19/22  1:26 PM  Result Value Ref Range   Lactic Acid, Venous 2.9 (HH) 0.5 - 1.9 mmol/L    Comment: CRITICAL RESULT CALLED TO, READ BACK BY AND VERIFIED WITH ROWE,C RN @ [phone] 11/19/22 LEONARD,A Performed at [facility], [address], [place], [place] [address]   Comprehensive metabolic panel     Status: Abnormal   Collection Time: 11/19/22  1:52 PM  Result Value Ref Range   Sodium 135 135 - 145 mmol/L   Potassium 3.2 (L) 3.5 - 5.1 mmol/L   Chloride 99 98 - 111 mmol/L   CO2 23 22 - 32 mmol/L   Glucose, Bld 197 (H) 70 - 99 mg/dL    Comment: Glucose reference range applies only to samples taken after fasting for at least 8 hours.   BUN 7 6 - 20 mg/dL   Creatinine, Ser 0.62 0.44 - 1.00 mg/dL   Calcium 9.0 8.9 - 37.6 mg/dL   Total Protein 7.8 6.5 - 8.1 g/dL   Albumin 4.2 3.5 - 5.0 g/dL   AST 283 (H) 15 - 41 U/L   ALT 51 (H) 0 - 44 U/L   Alkaline Phosphatase 52 38 - 126 U/L   Total Bilirubin 1.7 (H) 0.3 - 1.2 mg/dL   GFR, Estimated >15 >17 mL/min    Comment: (NOTE) Calculated using the CKD-EPI Creatinine Equation (2021)    Anion gap 13 5 - 15    Comment: Performed at  [facility], [address], [place], [place] [address]  CBC with Differential     Status: Abnormal   Collection Time: 11/19/22  1:52 PM  Result Value Ref Range   WBC 6.4 4.0 - 10.5 K/uL   RBC 2.96 (L) 3.87 - 5.11 MIL/uL   Hemoglobin 12.3 12.0 - 15.0 g/dL   HCT 37.1 (L) 06.2 - 69.4 %   MCV 119.3 (H) 80.0 - 100.0 fL   MCH 41.6 (H) 26.0 - 34.0 pg   MCHC 34.8 30.0 - 36.0 g/dL   RDW 85.4 62.7 -  15.5 %   Platelets 207 150 - 400 K/uL   nRBC 0.0 0.0 - 0.2 %   Neutrophils Relative % 79 %   Neutro Abs 5.1 1.7 - 7.7 K/uL   Lymphocytes Relative 11 %   Lymphs Abs 0.7 0.7 - 4.0 K/uL   Monocytes Relative 7 %   Monocytes Absolute 0.4 0.1 - 1.0 K/uL   Eosinophils Relative 1 %   Eosinophils Absolute 0.0 0.0 - 0.5 K/uL   Basophils Relative 1 %   Basophils Absolute 0.1 0.0 - 0.1 K/uL   WBC Morphology MORPHOLOGY UNREMARKABLE    RBC Morphology MORPHOLOGY UNREMARKABLE    Smear Review MORPHOLOGY UNREMARKABLE    Immature Granulocytes 1 %   Abs Immature Granulocytes 0.09 (H) 0.00 - 0.07 K/uL    Comment: Performed at [facility], [address], [place], [place] [address]   No results found.    Assessment/Plan 47 yo female 4 days s/p left breast lumpectomy with left axillary sentinel lymph node biopsy. She now has a left axillary hematoma with significant pain. I recommend washout in the OR, with possible drain placement. I reviewed the details of this procedure and she agrees to proceed. Plan for discharge home postoperatively, vs overnight observation. - NPO - Pain and nausea control - Proceed to OR this evening for left axillary washout   Sophronia Simas, MD [facility] General, Hepatobiliary and Pancreatic Surgery 11/19/22 5:36 PM

## 2022-11-19 NOTE — Op Note (Signed)
Date: 11/19/22  Patient: [name] MRN: [id number]  Preoperative Diagnosis: Left axillary hematoma Postoperative Diagnosis: Same  Procedure: Evacuation of left axillary hematoma with drain placement  Surgeon: Sophronia Simas, MD  EBL: 350 mL clot evacuated  Anesthesia: General  Specimens: None  Indications: [name] is a 47 yo female with invasive ductal carcinoma of the left breast, for which she underwent a left breast lumpectomy and left axillary sentinel lymph node biopsy 4 days ago. This morning she developed acute pain, swelling and bruising at her axillary incision, and presented to the ED. Exam was consistent with a large hematoma. She was brought to the operating room for washout.  Findings: Large hematoma in the left axilla, evacuated with no active bleeding noted. 15-Fr round JP drain placed.  Procedure details: Informed consent was obtained in the preoperative area prior to the procedure. The patient was brought to the operating room and placed on the table in the supine position. General anesthesia was induced and appropriate lines and drains were placed for intraoperative monitoring. Perioperative antibiotics were administered per SCIP guidelines. The left axilla and breast were prepped and draped in the usual sterile fashion. A pre-procedure timeout was taken verifying patient identity, surgical site and procedure to be performed.  The previous left axillary incision was re-opened with a 15-blade scalpel and the subcutaneous tissue was spread. The previous sutures were cut and the wound was completely opened. A large amount of clotted blood was evacuated, an estimated volume of approximately 350 mL. The wound was copiously irrigated with saline and thoroughly examined. The tissue all appeared hemostatic with no active bleeding noted. A 15-Fr JP drain was placed in the wound, brought out through the skin inferior to the incision, and secured to the skin with a 2-0  Nylon suture. The clavipectoral fascia was closed with interrupted 3-0 Vicryl sutures. The deep dermis was closed with interrupted 3-0 Vicryl, and the skin was closed with a running subcuticular 4-0 monocryl suture. Dermabond was applied.  The patient tolerated the procedure well with no apparent complications. All counts were correct x2 at the end of the procedure. The patient was extubated and taken to PACU in stable condition.  Sophronia Simas, MD 11/19/22 9:50 PM

## 2022-11-19 NOTE — Anesthesia Procedure Notes (Signed)
Procedure Name: Intubation Date/Time: 11/19/2022 9:05 PM  Performed by: Laruth Bouchard., CRNAPre-anesthesia Checklist: Patient identified, Emergency Drugs available, Patient being monitored, Timeout performed and Suction available Patient Re-evaluated:Patient Re-evaluated prior to induction Oxygen Delivery Method: Circle system utilized Preoxygenation: Pre-oxygenation with 100% oxygen Induction Type: IV induction Ventilation: Mask ventilation without difficulty Laryngoscope Size: Mac and 3 Grade View: Grade I Tube type: Oral Tube size: 7.0 mm Number of attempts: 1 Airway Equipment and Method: Stylet Placement Confirmation: ETT inserted through vocal cords under direct vision, positive ETCO2 and breath sounds checked- equal and bilateral Secured at: 22 cm Tube secured with: Tape Dental Injury: Teeth and Oropharynx as per pre-operative assessment

## 2022-11-19 NOTE — Transfer of Care (Signed)
Immediate Anesthesia Transfer of Care Note  Patient: [name]  Procedure(s) Performed: IRRIGATION AND DEBRIDEMENT LEFT AXILLA (Left)  Patient Location: PACU  Anesthesia Type:General  Level of Consciousness: awake, alert , and oriented  Airway & Oxygen Therapy: Patient Spontanous Breathing and Patient connected to nasal cannula oxygen  Post-op Assessment: Report given to RN and Post -op Vital signs reviewed and stable  Post vital signs: Reviewed and stable  Last Vitals:  Vitals Value Taken Time  BP 123/86 11/19/22 2145  Temp    Pulse 88 11/19/22 2148  Resp 10 11/19/22 2148  SpO2 100 % 11/19/22 2148  Vitals shown include unvalidated device data.  Last Pain:  Vitals:   11/19/22 1558  TempSrc: Oral  PainSc:          Complications: No notable events documented.

## 2022-11-19 NOTE — Telephone Encounter (Signed)
Received call from pt stating her lumpectomy site has swollen to the size of a baseball and she has had "about 10 episodes of vomiting" in her VM she states she was on her way to ED.   Spoke with pt's husband and he states they are in ED waiting for on call surgeon to come see pt.

## 2022-11-19 NOTE — ED Provider Notes (Signed)
[place] EMERGENCY DEPARTMENT AT [facility] Provider Note   CSN: [id number] Arrival date & time: 11/19/22  1312     History  Chief Complaint  Patient presents with   Post-op Problem    [name] is a 47 y.o. female.  HPI 47 year old female presents with left axillary swelling and pain.  4 days ago she had a left-sided lumpectomy and sentinel lymph node biopsy.  She had some soreness and has been taking the oxycodone.  She has not had a significant bowel movement for multiple days and has had some generalized abdominal discomfort from that.  However when she change the compression wrap this morning at work she noticed pain and swelling to her left axilla.  Family in the room describes it as being as swollen as a baseball.  There was a small amount of bloody discharge at 1 point but no significant bleeding or discharge since.  The swelling seems better currently though not back to normal.  Is still pretty tender and sore which is new.  She also had numerous episodes of vomiting today which seem to be a little better after Zofran given in the waiting room.  She has vomited numerous times and was unable to count how many.  However no current abdominal pain.  Home Medications Prior to Admission medications   Medication Sig Start Date End Date Taking? Authorizing Provider  ALPRAZolam Prudy Feeler) 0.5 MG tablet Take 0.5 mg by mouth daily as needed for anxiety. 10/23/22   [provider]  ELDERBERRY PO Take 1 tablet by mouth 2 (two) times a week.    [provider]  estradiol (ESTRACE VAGINAL) 0.1 MG/GM vaginal cream Place 1 Applicatorful vaginally at bedtime. Patient not taking: Reported on 11/05/2022 07/26/21   [name], Valentino Hue, MD  levonorgestrel (MIRENA) 20 MCG/24HR IUD 1 each by Intrauterine route once.    [provider]  losartan (COZAAR) 25 MG tablet Take 1 tablet (25 mg total) by mouth daily. 11/14/22   Donato Schultz, DO  omeprazole  (PRILOSEC) 40 MG capsule TAKE 1 CAPSULE BY MOUTH TWICE A DAY Patient taking differently: Take 40 mg by mouth daily. 07/27/22   Napoleon Form, MD  oxyCODONE (OXY IR/ROXICODONE) 5 MG immediate release tablet Take 1 tablet (5 mg total) by mouth every 6 (six) hours as needed for severe pain. 11/15/22   Cornett, Maisie Fus, MD  tamoxifen (NOLVADEX) 20 MG tablet Take 1 tablet (20 mg total) by mouth daily. 07/26/21   [name], Valentino Hue, MD  traMADol (ULTRAM) 50 MG tablet Take 1 tablet (50 mg total) by mouth every 12 (twelve) hours as needed. Patient not taking: Reported on 11/05/2022 09/13/22   Rachel Moulds, MD  valACYclovir (VALTREX) 1000 MG tablet Take 1 tablet (1,000 mg total) by mouth 3 (three) times daily as needed. For flare ups 06/29/20   Zola Button, Myrene Buddy R, DO  Norgestim-Eth Estrad Triphasic (ORTHO TRI-CYCLEN, 28, PO) Take 0.035 mg by mouth daily.   10/02/17  [provider]      Allergies    Tamiflu [oseltamivir phosphate], Ciprofloxacin, Metoclopramide hcl, and Sulfa antibiotics    Review of Systems   Review of Systems  Respiratory:  Negative for shortness of breath.   Cardiovascular:  Negative for chest pain.  Gastrointestinal:  Positive for constipation and vomiting. Negative for abdominal pain.  Skin:  Positive for color change.    Physical Exam Updated Vital Signs BP 103/80 (BP Location: Right Arm)   Pulse Marland Kitchen)  102   Temp 98.3 F (36.8 C) (Oral)   Resp 16   SpO2 99%  Physical Exam Vitals and nursing note reviewed.  Constitutional:      General: She is not in acute distress.    Appearance: She is well-developed. She is not ill-appearing or diaphoretic.  HENT:     Head: Normocephalic and atraumatic.  Cardiovascular:     Rate and Rhythm: Normal rate and regular rhythm.     Pulses:          Radial pulses are 2+ on the left side.     Heart sounds: Normal heart sounds.  Pulmonary:     Effort: Pulmonary effort is normal.     Breath sounds: Normal breath sounds.   Chest:    Abdominal:     General: There is no distension.     Palpations: Abdomen is soft.     Tenderness: There is no abdominal tenderness.  Skin:    General: Skin is warm and dry.  Neurological:     Mental Status: She is alert.     ED Results / Procedures / Treatments   Labs (all labs ordered are listed, but only abnormal results are displayed) Labs Reviewed  LACTIC ACID, PLASMA - Abnormal; Notable for the following components:      Result Value   Lactic Acid, Venous 2.9 (*)    All other components within normal limits  COMPREHENSIVE METABOLIC PANEL - Abnormal; Notable for the following components:   Potassium 3.2 (*)    Glucose, Bld 197 (*)    AST 178 (*)    ALT 51 (*)    Total Bilirubin 1.7 (*)    All other components within normal limits  CBC WITH DIFFERENTIAL/PLATELET - Abnormal; Notable for the following components:   RBC 2.96 (*)    HCT 35.3 (*)    MCV 119.3 (*)    MCH 41.6 (*)    Abs Immature Granulocytes 0.09 (*)    All other components within normal limits  LACTIC ACID, PLASMA    EKG None  Radiology No results found.  Procedures Procedures    Medications Ordered in ED Medications  Chlorhexidine Gluconate Cloth 2 % PADS 6 each (has no administration in time range)  ceFAZolin (ANCEF) IVPB 2g/100 mL premix (has no administration in time range)  ondansetron (ZOFRAN-ODT) disintegrating tablet 4 mg (4 mg Oral Given 11/19/22 1352)  lactated ringers bolus 1,000 mL (1,000 mLs Intravenous New Bag/Given 11/19/22 1636)  potassium chloride SA (KLOR-CON M) CR tablet 40 mEq (40 mEq Oral Given 11/19/22 1721)    ED Course/ Medical Decision Making/ A&P                             Medical Decision Making Amount and/or Complexity of Data Reviewed Labs: ordered.    Details: White blood cell count normal.  Mild hypokalemia.  LFTs are abnormal but consistent with baseline.  Lactate is elevated though I do not think this is sepsis.  Risk Prescription drug  management. Decision regarding hospitalization.   I think the lactate is probably more dehydration as she overall appears well.  Her heart rate is normal currently.  Based on exam and presentation this is most likely a hematoma.  However since it came on suddenly I did discuss with general surgery and Dr. Freida Busman has consulted and seen patient and will take to the OR for evacuation.  We discussed antibiotics but will hold off  for now as it does not seem infected and again I think this lactate is more of a dehydration component.  Will give fluids.  Potassium repleted.  Otherwise her abdominal exam is benign, not sure if she is vomiting from medications or pain.        Final Clinical Impression(s) / ED Diagnoses Final diagnoses:  Hematoma    Rx / DC Orders ED Discharge Orders     None         Pricilla Loveless, MD 11/19/22 1744

## 2022-11-19 NOTE — ED Triage Notes (Signed)
PAtient here for evaluation of nausea, vomiting, and pain at her surgical site after a lumpectomy on Thursday last week.

## 2022-11-19 NOTE — Anesthesia Preprocedure Evaluation (Addendum)
Anesthesia Evaluation  Patient identified by MRN, date of birth, ID band Patient awake    Reviewed: Allergy & Precautions, NPO status , Patient's Chart, lab work & pertinent test results  Airway Mallampati: II  TM Distance: >3 FB Neck ROM: Full    Dental no notable dental hx.    Pulmonary former smoker   Pulmonary exam normal        Cardiovascular hypertension, Pt. on medications Normal cardiovascular exam     Neuro/Psych Seizures -, Well Controlled,  PSYCHIATRIC DISORDERS         GI/Hepatic Neg liver ROS,GERD  Medicated and Controlled,,  Endo/Other  negative endocrine ROS    Renal/GU negative Renal ROS     Musculoskeletal   Abdominal   Peds  Hematology   Anesthesia Other Findings Left Axilla Hematoma  Reproductive/Obstetrics Hcg negative                             Anesthesia Physical Anesthesia Plan  ASA: 2 and emergent  Anesthesia Plan: General   Post-op Pain Management:    Induction: Intravenous  PONV Risk Score and Plan: 3 and Ondansetron, Dexamethasone, Midazolam and Treatment may vary due to age or medical condition  Airway Management Planned: Oral ETT  Additional Equipment:   Intra-op Plan:   Post-operative Plan: Extubation in OR  Informed Consent: I have reviewed the patients History and Physical, chart, labs and discussed the procedure including the risks, benefits and alternatives for the proposed anesthesia with the patient or authorized representative who has indicated his/her understanding and acceptance.     Dental advisory given  Plan Discussed with: CRNA  Anesthesia Plan Comments:        Anesthesia Quick Evaluation

## 2022-11-19 NOTE — ED Provider Triage Note (Signed)
Emergency Medicine Provider Triage Evaluation Note  [name] , a 47 y.o. female  was evaluated in triage.  Pt complains of wound infection to where lymph nodes removed after left breast lumpectomy/biopsy 4 days ago.  States that she was doing well postprocedure until this morning when she woke up to a large lump to the left axilla where the incision was made as well as associated pain and nausea.  Started vomiting on arrival to the ED.  No known fever.  Status post previous lumpectomy and radiation therapy in 2021 patient was diagnosed with grade 1 invasive ductal carcinoma.  She was maintained on tamoxifen.  Next follow-up with her surgeon is in mid May.  Review of Systems  Positive: See HPI Negative: See HPI  Physical Exam  BP (!) 137/103 (BP Location: Right Arm)   Pulse (!) 125   Temp 98.1 F (36.7 C)   Resp 20   SpO2 96%  Gen:   Awake, actively vomiting Resp:  Normal effort lungs clear to auscultation MSK:   Moves extremities without difficulty  Other:  Circular fluid collection to left axilla surrounding surgical incision from recent lumpectomy/sentinel node removal, minimal surrounding erythema, no streaking erythema, no drainage; tachycardia with regular rhythm  Medical Decision Making  Medically screening exam initiated at 1:32 PM.  Appropriate orders placed.  [name] was informed that the remainder of the evaluation will be completed by another provider, this initial triage assessment does not replace that evaluation, and the importance of remaining in the ED until their evaluation is complete.     Tonette Lederer, PA-C 11/19/22 1337

## 2022-11-20 ENCOUNTER — Encounter (HOSPITAL_COMMUNITY): Payer: Self-pay | Admitting: Surgery

## 2022-11-20 LAB — CBC
HCT: 29.6 % — ABNORMAL LOW (ref 36.0–46.0)
Hemoglobin: 10.5 g/dL — ABNORMAL LOW (ref 12.0–15.0)
MCH: 41.8 pg — ABNORMAL HIGH (ref 26.0–34.0)
MCHC: 35.5 g/dL (ref 30.0–36.0)
MCV: 117.9 fL — ABNORMAL HIGH (ref 80.0–100.0)
Platelets: 161 10*3/uL (ref 150–400)
RBC: 2.51 MIL/uL — ABNORMAL LOW (ref 3.87–5.11)
RDW: 12.5 % (ref 11.5–15.5)
WBC: 8.3 10*3/uL (ref 4.0–10.5)
nRBC: 0 % (ref 0.0–0.2)

## 2022-11-20 LAB — SURGICAL PATHOLOGY

## 2022-11-20 LAB — LACTIC ACID, PLASMA: Lactic Acid, Venous: 1 mmol/L (ref 0.5–1.9)

## 2022-11-20 LAB — HIV ANTIBODY (ROUTINE TESTING W REFLEX): HIV Screen 4th Generation wRfx: NONREACTIVE

## 2022-11-20 MED ORDER — OXYCODONE HCL 5 MG PO TABS
5.0000 mg | ORAL_TABLET | Freq: Four times a day (QID) | ORAL | 0 refills | Status: DC | PRN
Start: 2022-11-20 — End: 2022-12-10

## 2022-11-20 NOTE — Progress Notes (Signed)
   Progress Note  1 Day Post-Op  Subjective: Having some soreness and incision and drain site   Objective: Vital signs in last 24 hours: Temp:  [97.2 F (36.2 C)-98.4 F (36.9 C)] 98.4 F (36.9 C) (04/16 0518) Pulse Rate:  [74-125] 78 (04/16 0518) Resp:  [10-20] 11 (04/15 2230) BP: (103-137)/(80-103) 109/82 (04/16 0518) SpO2:  [95 %-100 %] 100 % (04/16 0518)    Intake/Output from previous day: 04/15 0701 - 04/16 0700 In: 1900 [P.O.:100; I.V.:800; IV Piggyback:1000] Out: 153 [Drains:103; Blood:50] Intake/Output this shift: No intake/output data recorded.  PE: General: pleasant, WD, female who is laying in bed in NAD Lungs: Respiratory effort nonlabored MSK: all 4 extremities are symmetrical with no cyanosis, clubbing, or edema. Skin: warm and dry. L axillary incision cdi with surgical glue, drain site with bandage cdi, drain with sanguinous output Psych: A&Ox3 with an appropriate affect.    Lab Results:  Recent Labs    11/19/22 1352 11/20/22 0009  WBC 6.4 8.3  HGB 12.3 10.5*  HCT 35.3* 29.6*  PLT 207 161   BMET Recent Labs    11/19/22 1352  NA 135  K 3.2*  CL 99  CO2 23  GLUCOSE 197*  BUN 7  CREATININE 0.63  CALCIUM 9.0   PT/INR No results for input(s): "LABPROT", "INR" in the last 72 hours. CMP     Component Value Date/Time   NA 135 11/19/2022 1352   K 3.2 (L) 11/19/2022 1352   CL 99 11/19/2022 1352   CO2 23 11/19/2022 1352   GLUCOSE 197 (H) 11/19/2022 1352   BUN 7 11/19/2022 1352   CREATININE 0.63 11/19/2022 1352   CREATININE 0.78 07/27/2019 1133   CALCIUM 9.0 11/19/2022 1352   PROT 7.8 11/19/2022 1352   ALBUMIN 4.2 11/19/2022 1352   AST 178 (H) 11/19/2022 1352   AST 89 (H) 07/27/2019 1133   ALT 51 (H) 11/19/2022 1352   ALT 56 (H) 07/27/2019 1133   ALKPHOS 52 11/19/2022 1352   BILITOT 1.7 (H) 11/19/2022 1352   BILITOT 0.6 07/27/2019 1133   GFRNONAA >60 11/19/2022 1352   GFRNONAA >60 07/27/2019 1133   GFRAA >60 04/05/2020 1011    GFRAA >60 07/27/2019 1133   Lipase  No results found for: "LIPASE"     Studies/Results: No results found.  Anti-infectives: Anti-infectives (From admission, onward)    Start     Dose/Rate Route Frequency Ordered Stop   11/20/22 0600  ceFAZolin (ANCEF) IVPB 2g/100 mL premix        2 g 200 mL/hr over 30 Minutes Intravenous On call to O.R. 11/19/22 1735 11/19/22 2138        Assessment/Plan Left axillary hematoma   S/p evacuation of hematoma with drain placement 4/15 Dr. Freida Busman S/p left breast lumpectomy and left axillary sentinel lymph node biopsy on 4/11 by Dr. Lattie Corns output ~100 ml so far, SS. Drain to remain on dc. She needs drain teaching She has follow up with Dr. Luisa Hart on 5/13. She will need sooner follow up for drain which I will arrange. Pain controlled  Dc today     LOS: 0 days   Eric Form, [facility] Surgery 11/20/2022, 7:58 AM Please see Amion for pager number during day hours 7:00am-4:30pm

## 2022-11-20 NOTE — TOC Transition Note (Signed)
Transition of Care [facility]) - CM/SW Discharge Note   Patient Details  Name: [name] MRN: [id number] Date of Birth: 1976-01-21  [facility] CM/SW Contact:  Leone Haven, RN Phone Number: 11/20/2022, 10:35 AM   Clinical Narrative:    Patient is from home with spouse, she states the Staff Nurse educated her on how to empty her drain at home and she feels very comfortable in doing this.  Her spouse is here to transport her home today.  She has no needs.          Patient Goals and CMS Choice      Discharge Placement                         Discharge Plan and Services Additional resources added to the After Visit Summary for                                       Social Determinants of Health (SDOH) Interventions SDOH Screenings   Tobacco Use: Medium Risk (11/20/2022)     Readmission Risk Interventions     No data to display

## 2022-11-20 NOTE — Discharge Instructions (Signed)
Ok to shower with the drain  Record drain output  Call the office for any questions      JP Drain Totals Bring this sheet to all of your post-operative appointments while you have your drains. Please measure your drains by CC's or ML's. Make sure you drain and measure your JP Drains 3 times per day. At the end of each day, add up totals for the left side and add up totals for the right side.    ( 9 am )     ( 3 pm )        ( 9 pm )                Date L  R  L  R  L  R  Total L/R

## 2022-11-20 NOTE — Discharge Summary (Signed)
Physician Discharge Summary  Patient ID: [name] MRN: [id number] DOB/AGE: [id number] 47 y.o.  Admit date: 11/19/2022 Discharge date: 11/20/2022  Admission Diagnoses:  Discharge Diagnoses:  Principal Problem:   Hematoma of left axilla Active Problems:   Invasive ductal carcinoma of breast, female, left   S/P debridement   Discharged Condition: good  Hospital Course: uneventful post op recovery.  Discharged with drain POD#1  Consults: None  Significant Diagnostic Studies:   Treatments: surgery: Evacuation of left axillary hematoma with drain placement  Discharge Exam: Blood pressure (!) 126/90, pulse 70, temperature 98.7 F (37.1 C), temperature source Oral, resp. rate 18, SpO2 100 %. General appearance: alert, cooperative, and no distress Resp: clear to auscultation bilaterally Cardio: regular rate and rhythm, S1, S2 normal, no murmur, click, rub or gallop Incision/Wound: drain serosang, mild ecchymosis in left axilla  Disposition: Discharge disposition: 01-Home or Self Care        Allergies as of 11/20/2022       Reactions   Tamiflu [oseltamivir Phosphate] Other (See Comments)   Seizure   Cipro [ciprofloxacin Hcl] Diarrhea, Nausea And Vomiting   Reglan [metoclopramide] Other (See Comments)   "Lock jaw"   Sulfa Antibiotics Nausea And Vomiting        Medication List     TAKE these medications    acetaminophen 500 MG tablet Commonly known as: TYLENOL Take 1,000 mg by mouth every 6 (six) hours as needed for moderate pain.   ELDERBERRY PO Take 1 tablet by mouth 2 (two) times a week.   estradiol 0.1 MG/GM vaginal cream Commonly known as: ESTRACE VAGINAL Place 1 Applicatorful vaginally at bedtime.   losartan 25 MG tablet Commonly known as: COZAAR Take 1 tablet (25 mg total) by mouth daily. What changed: when to take this   omeprazole 40 MG capsule Commonly known as: PRILOSEC TAKE 1 CAPSULE BY MOUTH TWICE A DAY What changed: when to  take this   oxyCODONE 5 MG immediate release tablet Commonly known as: Oxy IR/ROXICODONE Take 1 tablet (5 mg total) by mouth every 6 (six) hours as needed for severe pain or moderate pain. What changed: reasons to take this   tamoxifen 20 MG tablet Commonly known as: NOLVADEX Take 1 tablet (20 mg total) by mouth daily.   traMADol 50 MG tablet Commonly known as: ULTRAM Take 1 tablet (50 mg total) by mouth every 12 (twelve) hours as needed.   valACYclovir 1000 MG tablet Commonly known as: VALTREX Take 1 tablet (1,000 mg total) by mouth 3 (three) times daily as needed. For flare ups        Follow-up Information     Cornett, Maisie Fus, MD Follow up.   Specialty: General Surgery Why: We are making a follow up appointment for you., Please call to confirm appointment time., Arrive 15 minutes early to complete check in, and bring photo ID and insurance card. Contact information: [address] [place] [address] [phone]                 Signed: Abigail Miyamoto 11/20/2022, 9:34 AM

## 2022-11-20 NOTE — Anesthesia Postprocedure Evaluation (Signed)
Anesthesia Post Note  Patient: [name]  Procedure(s) Performed: IRRIGATION AND DEBRIDEMENT LEFT AXILLA (Left)     Patient location during evaluation: PACU Anesthesia Type: General Level of consciousness: awake Pain management: pain level controlled Vital Signs Assessment: post-procedure vital signs reviewed and stable Respiratory status: spontaneous breathing, nonlabored ventilation and respiratory function stable Cardiovascular status: blood pressure returned to baseline and stable Postop Assessment: no apparent nausea or vomiting Anesthetic complications: no   No notable events documented.  Last Vitals:  Vitals:   11/19/22 2215 11/19/22 2230  BP: (!) 132/95 (!) 124/92  Pulse: 76 74  Resp: 11 11  Temp:  36.4 C  SpO2: 99% 96%    Last Pain:  Vitals:   11/20/22 0012  TempSrc:   PainSc: 2                  Wandalene Abrams P [place]

## 2022-11-21 ENCOUNTER — Inpatient Hospital Stay: Payer: 59 | Attending: Hematology and Oncology

## 2022-11-21 ENCOUNTER — Encounter: Payer: Self-pay | Admitting: Surgery

## 2022-11-21 VITALS — BP 111/77 | HR 89 | Temp 99.6°F | Resp 18

## 2022-11-21 DIAGNOSIS — Z5111 Encounter for antineoplastic chemotherapy: Secondary | ICD-10-CM | POA: Insufficient documentation

## 2022-11-21 DIAGNOSIS — C50312 Malignant neoplasm of lower-inner quadrant of left female breast: Secondary | ICD-10-CM | POA: Insufficient documentation

## 2022-11-21 DIAGNOSIS — C50912 Malignant neoplasm of unspecified site of left female breast: Secondary | ICD-10-CM

## 2022-11-21 MED ORDER — GOSERELIN ACETATE 3.6 MG ~~LOC~~ IMPL
3.6000 mg | DRUG_IMPLANT | Freq: Once | SUBCUTANEOUS | Status: AC
  Administered 2022-11-21: 3.6 mg via SUBCUTANEOUS
  Filled 2022-11-21: qty 3.6

## 2022-11-21 NOTE — Patient Instructions (Signed)
\[id number] Implant What is this medication? GOSERELIN (GOE se rel in) treats prostate cancer and breast cancer. It works by decreasing levels of the hormones testosterone and estrogen in the body. This prevents prostate and breast cancer cells from spreading or growing. It may also be used to treat endometriosis. This is a condition where the tissue that lines the uterus grows outside the uterus. It works by decreasing the amount of estrogen your body makes, which reduces heavy bleeding and pain. It can also be used to help thin the lining of the uterus before a surgery used to prevent or reduce heavy periods. This medicine may be used for other purposes; ask your health care provider or pharmacist if you have questions. COMMON BRAND NAME(S): Zoladex, Zoladex 37-Month What should I tell my care team before I take this medication? They need to know if you have any of these conditions: Bone problems Diabetes Heart disease History of irregular heartbeat or rhythm An unusual or allergic reaction to goserelin, other medications, foods, dyes, or preservatives Pregnant or trying to get pregnant Breastfeeding How should I use this medication? This medication is injected under the skin. It is given by your care team in a hospital or clinic setting. Talk to your care team about the use of this medication in children. Special care may be needed. Overdosage: If you think you have taken too much of this medicine contact a poison control center or emergency room at once. NOTE: This medicine is only for you. Do not share this medicine with others. What if I miss a dose? Keep appointments for follow-up doses. It is important not to miss your dose. Call your care team if you are unable to keep an appointment. What may interact with this medication? Do not take this medication with any of the following: Cisapride Dronedarone Pimozide Thioridazine This medication may also interact with the  following: Other medications that cause heart rhythm changes This list may not describe all possible interactions. Give your health care provider a list of all the medicines, herbs, non-prescription drugs, or dietary supplements you use. Also tell them if you smoke, drink alcohol, or use illegal drugs. Some items may interact with your medicine. What should I watch for while using this medication? Visit your care team for regular checks on your progress. Your symptoms may appear to get worse during the first weeks of this therapy. Tell your care team if your symptoms do not start to get better or if they get worse after this time. Using this medication for a long time may weaken your bones. If you smoke or frequently drink alcohol you may increase your risk of bone loss. A family history of osteoporosis, chronic use of medications for seizures (convulsions), or corticosteroids can also increase your risk of bone loss. The risk of bone fractures may be increased. Talk to your care team about your bone health. This medication may increase blood sugar. The risk may be higher in patients who already have diabetes. Ask your care team what you can do to lower your risk of diabetes while taking this medication. This medication should stop regular monthly menstruation in women. Tell your care team if you continue to menstruate. Talk to your care team if you wish to become pregnant or think you might be pregnant. This medication can cause serious birth defects if taken during pregnancy or for 12 weeks after stopping treatment. Talk to your care team about reliable forms of contraception. Do not breastfeed while taking this  medication. This medication may cause infertility. Talk to your care team if you are concerned about your fertility. What side effects may I notice from receiving this medication? Side effects that you should report to your care team as soon as possible: Allergic reactions--skin rash, itching,  hives, swelling of the face, lips, tongue, or throat Change in the amount of urine Heart attack--pain or tightness in the chest, shoulders, arms, or jaw, nausea, shortness of breath, cold or clammy skin, feeling faint or lightheaded Heart rhythm changes--fast or irregular heartbeat, dizziness, feeling faint or lightheaded, chest pain, trouble breathing High blood sugar (hyperglycemia)--increased thirst or amount of urine, unusual weakness or fatigue, blurry vision High calcium level--increased thirst or amount of urine, nausea, vomiting, confusion, unusual weakness or fatigue, bone pain Pain, redness, irritation, or bruising at the injection site Severe back pain, numbness or weakness of the hands, arms, legs, or feet, loss of coordination, loss of bowel or bladder control Stroke--sudden numbness or weakness of the face, arm, or leg, trouble speaking, confusion, trouble walking, loss of balance or coordination, dizziness, severe headache, change in vision Swelling and pain of the tumor site or lymph nodes Trouble passing urine Side effects that usually do not require medical attention (report to your care team if they continue or are bothersome): Change in sex drive or performance Headache Hot flashes Rapid or extreme change in emotion or mood Sweating Swelling of the ankles, hands, or feet Unusual vaginal discharge, itching, or odor This list may not describe all possible side effects. Call your doctor for medical advice about side effects. You may report side effects to FDA at 1-800-FDA-1088. Where should I keep my medication? This medication is given in a hospital or clinic. It will not be stored at home. NOTE: This sheet is a summary. It may not cover all possible information. If you have questions about this medicine, talk to your doctor, pharmacist, or health care provider.  2023 Elsevier/Gold Standard (2021-12-06 00:00:00)

## 2022-11-22 ENCOUNTER — Encounter: Payer: Self-pay | Admitting: *Deleted

## 2022-11-22 ENCOUNTER — Telehealth: Payer: Self-pay | Admitting: *Deleted

## 2022-11-22 DIAGNOSIS — C50912 Malignant neoplasm of unspecified site of left female breast: Secondary | ICD-10-CM

## 2022-11-22 NOTE — Telephone Encounter (Signed)
Spoke with patient to let her know that we will not send an oncotype due to the small tumor size (0.4cm G1). She will not need chemo and was happy to hear this news. Informed her I would let Dr. Joellen Jersey office know and she would get a call to come in for an appt to discuss xrt.  Patient verbalized understanding.

## 2022-11-26 ENCOUNTER — Telehealth: Payer: Self-pay | Admitting: *Deleted

## 2022-11-26 ENCOUNTER — Encounter: Payer: Self-pay | Admitting: *Deleted

## 2022-11-26 NOTE — Telephone Encounter (Signed)
Spoke with patient to let her know that we can cancel appt with Dr. Al Pimple on 5/13 but to still keep injection appt for that day.  Dr. Al Pimple will see her after xrt complete since we r not doing oncotype.Patient verbalized understanding.

## 2022-11-29 ENCOUNTER — Other Ambulatory Visit: Payer: Self-pay | Admitting: Family Medicine

## 2022-11-29 ENCOUNTER — Other Ambulatory Visit: Payer: Self-pay | Admitting: Gastroenterology

## 2022-11-29 DIAGNOSIS — K22719 Barrett's esophagus with dysplasia, unspecified: Secondary | ICD-10-CM

## 2022-11-29 DIAGNOSIS — I10 Essential (primary) hypertension: Secondary | ICD-10-CM

## 2022-11-29 DIAGNOSIS — K219 Gastro-esophageal reflux disease without esophagitis: Secondary | ICD-10-CM

## 2022-12-06 ENCOUNTER — Other Ambulatory Visit: Payer: Self-pay | Admitting: Family Medicine

## 2022-12-06 ENCOUNTER — Telehealth: Payer: Self-pay | Admitting: Family Medicine

## 2022-12-06 ENCOUNTER — Ambulatory Visit: Payer: Self-pay | Admitting: Rehabilitation

## 2022-12-06 DIAGNOSIS — I10 Essential (primary) hypertension: Secondary | ICD-10-CM

## 2022-12-06 MED ORDER — LOSARTAN POTASSIUM 25 MG PO TABS: 25.0000 mg | ORAL_TABLET | Freq: Every day | ORAL | 0 refills | Status: DC

## 2022-12-06 NOTE — Telephone Encounter (Signed)
Pt has not been seen since 03/2020. Please advise

## 2022-12-06 NOTE — Progress Notes (Signed)
New Breast Cancer Diagnosis: Left Breast  Did patient present with symptoms (if so, please note symptoms) or screening mammography?:Screening Mass    Location and Extent of disease :left breast.  Measured 0.4 cm in greatest dimension. Adenopathy no.  Histology per Pathology Report: grade 1, Invasive Ductal Carcinoma  Receptor Status: ER(positive), PR (positive), Her2-neu (negative), Ki-(5%)   Surgeon and surgical plan, if any:  Dr. Luisa Hart  -Left Breast lumpectomy with radioactive seed and SLN biopsy 11/15/2022. -Irrigation and debridement left axilla 11/19/2022- Dr. Freida Busman  Medical oncologist, treatment if any:   Dr. Al Pimple 10/15/2022 -If she has a final tumor measuring 5 mm or greater then we will proceed with Oncotype testing.  -with regards to antiestrogen therapy she will be a candidate for ovarian suppression with aromatase inhibitor since she is currently on tamoxifen at the time of new cancer growth.     Family History of Breast/Ovarian/Prostate Cancer: Maternal Aunt had breast cancer.  Lymphedema issues, if any: No      Pain issues, if any: Tenderness to surgical site.    SAFETY ISSUES: Prior radiation? Right Breast 7/20-04/14/2019. 50 Gy in 25 fractions, boost 14.4 Gy in 8 fractions. Dr. Mitzi Hansen Pacemaker/ICD? No Possible current pregnancy? IUD removed ~3 weeks ago. Is the patient on methotrexate? 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

## 2022-12-06 NOTE — Telephone Encounter (Signed)
Pt  called to see if she can get a one month refill of the losartan as she is currently going through breast cancer treatment and has so many appts and not PTO for work. She plans to come but right now is hard. Please send Losartan refill to pharmacy.

## 2022-12-07 NOTE — Telephone Encounter (Signed)
Noted. LVM advising patient

## 2022-12-10 NOTE — Progress Notes (Signed)
Radiation Oncology         ([phone]) [phone] ________________________________  Name: [name]        MRN: [id number]  Date of Service: 12/11/2022 DOB: 1976/05/10  WU:[id number] Chase, Grayling Congress, DO  Rachel Moulds, MD     REFERRING PHYSICIAN: Rachel Moulds, MD   DIAGNOSIS: The primary encounter diagnosis was Ductal carcinoma in situ (DCIS) of right breast. A diagnosis of Malignant neoplasm of lower-inner quadrant of left breast in female, estrogen receptor positive (HCC) was also pertinent to this visit.   HISTORY OF PRESENT ILLNESS: [name] is a 47 y.o. 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 She was treated with lumpectomy and adjuvant whole breast radiation by Dr. Mitzi Hansen; this was completed in September 2020. She was being followed in surveillance and was recently found to have a mass in the lower inner left breast with contrasted mammogram measuring 7 mm. An Ultrasound did not show any abnormal lymph nodes in the axilla. Biopsy on 09/24/22 showed a grade 1 invasive ductal carcinoma that was ER/PR positive, HER2 negative with a Ki 67 of 5%. Of note she had genetic testing with [facility] panel which was negative.   She underwent a left lumpectomy and sentinel node biopsy on 11/15/22 that showed a 4 mm grade 1 invasive ductal carcinoma and 4 sampled nodes were negative for disease. Her margins were clear, and no Oncotype Dx is planned due to the small size of her tumor. She had a postoperative hematoma of the axillary site that became significant in size and caused pain and she presented through the ED and was ultimately taken to the OR on 11/19/22 for evacuation and washout which took place on 11/19/22. She's seen to discuss adjuvant radiotherapy to the left breast.     PREVIOUS RADIATION THERAPY:     02/23/19 - 04/14/19: The patient initially received a dose of 50 Gy in 25 fractions to the right breast using whole-breast tangent fields.  This was delivered using a 3-D conformal technique. The patient then received a boost to the seroma. This delivered an additional 14.4 Gy in 8 fractions using a 4-field photon boost. The total dose was 64.4 Gy.   PAST MEDICAL HISTORY:  Past Medical History:  Diagnosis Date   Anemia    Back pain    Bimalleolar ankle fracture    left   Breast cancer (HCC)    right breast- 2020   Breast cancer (HCC) 09/2022   left breast   Breast cancer (HCC) 10/2022   Left   Family history of breast cancer    GERD (gastroesophageal reflux disease)    Hypertension    no meds   Kidney infection    1998- hosp.[facility]   Personal history of radiation therapy    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, more than 10 years ago- no instances since       PAST SURGICAL HISTORY: Past Surgical History:  Procedure Laterality Date   ANTERIOR LUMBAR FUSION  11/14/2011   Procedure: ANTERIOR LUMBAR FUSION 2 LEVELS;  Surgeon: Javier Docker, MD;  Location: [facility];  Service: Orthopedics;  Laterality: N/A;  ALIF L5-S1   BACK SURGERY     BREAST BIOPSY  11/13/2022   MM LT RADIOACTIVE SEED LOC MAMMO GUIDE 11/13/2022 [facility] MAMMOGRAPHY   BREAST LUMPECTOMY Right 01/2019   BREAST LUMPECTOMY WITH RADIOACTIVE SEED AND SENTINEL LYMPH NODE BIOPSY Left 11/15/2022   Procedure: LEFT BREAST  LUMPECTOMY WITH RADIOACTIVE SEED AND SENTINEL LYMPH NODE BIOPSY;  Surgeon: Harriette Bouillon, MD;  Location: [facility] OR;  Service: General;  Laterality: Left;   BREAST LUMPECTOMY WITH RADIOACTIVE SEED LOCALIZATION Right 01/13/2019   Procedure: RIGHT BREAST LUMPECTOMY WITH RADIOACTIVE SEED LOCALIZATION X3;  Surgeon: Harriette Bouillon, MD;  Location: [facility];  Service: General;  Laterality: Right;   CESAREAN SECTION     2007- /w epidural  anesthesia    COLONOSCOPY     IRRIGATION AND DEBRIDEMENT ABSCESS Left 11/19/2022   Procedure: IRRIGATION AND DEBRIDEMENT LEFT AXILLA;  Surgeon: Fritzi Mandes, MD;  Location: [facility] OR;  Service: General;  Laterality:  Left;   LUMBAR LAMINECTOMY/DECOMPRESSION MICRODISCECTOMY Right 12/15/2015   Procedure:  MICO LUMBER DECOMPRESSION L4-5 ON THE RIGHT, ;  Surgeon: Jene Every, MD;  Location: [facility];  Service: Orthopedics;  Laterality: Right;   ORIF ANKLE FRACTURE Left 10/03/2017   Procedure: OPEN REDUCTION INTERNAL FIXATION (ORIF) ANKLE BIMALLEOLAR FRACTURE; possible deltoid ligament repair;  Surgeon: Toni Arthurs, MD;  Location: [facility];  Service: Orthopedics;  Laterality: Left;   SPINE SURGERY     fusion   UPPER GASTROINTESTINAL ENDOSCOPY     WISDOM TOOTH EXTRACTION     young adult     FAMILY HISTORY:  Family History  Problem Relation Age of Onset   Lupus Mother    Breast cancer Maternal Aunt    Anesthesia problems Neg Hx    Colon cancer Neg Hx    Colon polyps Neg Hx    Esophageal cancer Neg Hx    Rectal cancer Neg Hx    Stomach cancer Neg Hx      SOCIAL HISTORY:  reports that she quit smoking about 5 years ago. Her smoking use included cigarettes. She has a 3.75 pack-year smoking history. She has never used smokeless tobacco. She reports current alcohol use of about 1.0 standard drink of alcohol per week. She reports that she does not use drugs. The patient is married and lives in [place]. She has a teenage son. She is not sexually active at this time.    ALLERGIES: Tamiflu [oseltamivir phosphate], Cipro [ciprofloxacin hcl], Reglan [metoclopramide], and Sulfa antibiotics   MEDICATIONS:  Current Outpatient Medications  Medication Sig Dispense Refill   acetaminophen (TYLENOL) 500 MG tablet Take 1,000 mg by mouth every 6 (six) hours as needed for moderate pain.     ELDERBERRY PO Take 1 tablet by mouth 2 (two) times a week.     goserelin (ZOLADEX) 3.6 MG injection Inject 3.6 mg into the skin every 28 (twenty-eight) days.     losartan (COZAAR) 25 MG tablet Take 1 tablet (25 mg total) by mouth daily. 30 tablet 0   omeprazole (PRILOSEC) 40 MG capsule TAKE 1 CAPSULE BY MOUTH  TWICE A DAY 180 capsule 0   valACYclovir (VALTREX) 1000 MG tablet Take 1 tablet (1,000 mg total) by mouth 3 (three) times daily as needed. For flare ups (Patient not taking: Reported on 11/19/2022) 90 tablet 2   No current facility-administered medications for this encounter.     REVIEW OF SYSTEMS: On review of systems, the patient reports that she is doing well overall since her second surgery. She feels like the area has not reaccumulated in the axilla. She reports she is wearing  a compressive bra, and is tender in the area of her prior axillary surgery, but no other complaints are verbalized.      PHYSICAL EXAM:  Wt Readings from Last 3 Encounters:  12/11/22 136 lb 8 oz (61.9 kg)  11/15/22 136 lb (61.7 kg)  11/07/22 136 lb 4.8 oz (61.8 kg)   Temp Readings from Last 3 Encounters:  12/11/22 97.7 F (36.5 C) (Temporal)  11/21/22 99.6 F (37.6 C) (Oral)  11/20/22 98.7 F (37.1 C) (Oral)   BP Readings from Last 3 Encounters:  12/11/22 (!) 145/103  11/21/22 111/77  11/20/22 (!) 126/90   Pulse Readings from Last 3 Encounters:  12/11/22 (!) 108  11/21/22 89  11/20/22 70    In general this is a well appearing caucasian female in no acute distress. She's alert and oriented x4 and appropriate throughout the examination. Cardiopulmonary assessment is negative for acute distress and she exhibits normal effort. Her left  axillary incision  is healing well without erythema, separation, or drainage.    ECOG = 1  0 - Asymptomatic (Fully active, able to carry on all predisease activities without restriction)  1 - Symptomatic but completely ambulatory (Restricted in physically strenuous activity but ambulatory and able to carry out work of a light or sedentary nature. For example, light housework, office work)  2 - Symptomatic, <50% in bed during the day (Ambulatory and capable of all self care but unable to carry out any work activities. Up and about more than 50% of waking hours)  3  - Symptomatic, >50% in bed, but not bedbound (Capable of only limited self-care, confined to bed or chair 50% or more of waking hours)  4 - Bedbound (Completely disabled. Cannot carry on any self-care. Totally confined to bed or chair)  5 - Death   Santiago Glad MM, Creech RH, Tormey DC, et al. [phone]). "Toxicity and response criteria of the [facility]". Am. Evlyn Clines. Oncol. 5 (6): 649-55    LABORATORY DATA:  Lab Results  Component Value Date   WBC 8.3 11/20/2022   HGB 10.5 (L) 11/20/2022   HCT 29.6 (L) 11/20/2022   MCV 117.9 (H) 11/20/2022   PLT 161 11/20/2022   Lab Results  Component Value Date   NA 135 11/19/2022   K 3.2 (L) 11/19/2022   CL 99 11/19/2022   CO2 23 11/19/2022   Lab Results  Component Value Date   ALT 51 (H) 11/19/2022   AST 178 (H) 11/19/2022   ALKPHOS 52 11/19/2022   BILITOT 1.7 (H) 11/19/2022      RADIOGRAPHY: MM Breast Surgical Specimen  Result Date: 11/15/2022 CLINICAL DATA:  Status post left breast lumpectomy EXAM: SPECIMEN RADIOGRAPH OF THE LEFT BREAST COMPARISON:  Previous exam(s). FINDINGS: Status post excision of the left breast. The radioactive seed and biopsy marker clip are present, completely intact, and were marked for pathology. IMPRESSION: Specimen radiograph of the left breast. Electronically Signed   By: Annia Belt M.D.   On: 11/15/2022 14:44  MM LT RADIOACTIVE SEED LOC MAMMO GUIDE  Result Date: 11/13/2022 CLINICAL DATA:  Localization prior to surgery. EXAM: MAMMOGRAPHIC GUIDED RADIOACTIVE SEED LOCALIZATION OF THE LEFT BREAST COMPARISON:  Previous exam(s). FINDINGS: Patient presents for radioactive seed localization prior to surgery. I met with the patient and we discussed the procedure of seed localization including benefits and alternatives. We discussed the high likelihood of a successful procedure. We discussed the risks of the procedure including infection, bleeding, tissue injury and further surgery. We discussed the low  dose of radioactivity involved in the procedure. Informed, written consent was given. The usual time-out protocol was performed immediately prior to the procedure. Using mammographic guidance, sterile technique, 1% lidocaine and  an I-125 radioactive seed, the biopsy clip marking the known malignancy in the inner lower left breast was localized using a medial approach. The follow-up mammogram images confirm the seed in the expected location and were marked for the surgeon. Follow-up survey of the patient confirms presence of the radioactive seed. Order number of I-125 seed:  [id number]. Total activity:  0.241 millicurie reference Date: October 03, 2022 The patient tolerated the procedure well and was [facility] from the [facility]. She was given instructions regarding seed removal. IMPRESSION: Radioactive seed localization left breast. No apparent complications. Electronically Signed   By: Gerome Sam III M.D.   On: 11/13/2022 11:10      IMPRESSION/PLAN: 1. Stage IA, pT1aN0M0, grade 1, ER/PR positive invasive ductal carcinoma of the left breast. Dr. Mitzi Hansen discusses the pathology findings and reviews the nature of early stage breast disease. She has done well since surgery and there are no plans for systemic chemotherapy given the early stage and small tumor size. Dr. Mitzi Hansen would recommend external radiotherapy to the breast  to reduce risks of local recurrence followed by antiestrogen therapy. We discussed the risks, benefits, short, and long term effects of radiotherapy, as well as the curative intent, and the patient is interested in proceeding. Dr. Mitzi Hansen discusses the delivery and logistics of radiotherapy and anticipates a course of 4 or 6 1/2 weeks of radiotherapy to the left breast with deep inspiration breath hold technique. She is motivated for a hypofractionated course of therapy. Written consent is obtained and placed in the chart, a copy was provided to the patient. She will simulate tomorrow.   2. History of High Grade, ER/PR negative DCIS of the right breast. The patient will continue in surveillance while she proceed with treatment and subsequent surveillance of #1.  3. Contraceptive Counseling. The patient is not sexually active and is amenorrheic with her antiestrogen therapy.   In a visit lasting 60 minutes, greater than 50% of the time was spent face to face reviewing her case, as well as in preparation of, discussing, and coordinating the patient's care.  The above documentation reflects my direct findings during this shared patient visit. Please see the separate note by Dr. Mitzi Hansen on this date for the remainder of the patient's plan of care.    Osker Mason, [facility]    **Disclaimer: This note was dictated with voice recognition software. Similar sounding words can inadvertently be transcribed and this note may contain transcription errors which may not have been corrected upon publication of note.**

## 2022-12-11 ENCOUNTER — Encounter: Payer: Self-pay | Admitting: Radiation Oncology

## 2022-12-11 ENCOUNTER — Ambulatory Visit
Admission: RE | Admit: 2022-12-11 | Discharge: 2022-12-11 | Disposition: A | Discharge status: Home / Self Care | Payer: 59 | Source: Ambulatory Visit | Attending: Radiation Oncology | Admitting: Radiation Oncology

## 2022-12-11 VITALS — BP 145/103 | HR 108 | Temp 97.7°F | Resp 18 | Ht 63.0 in | Wt 136.5 lb

## 2022-12-11 DIAGNOSIS — Z171 Estrogen receptor negative status [ER-]: Secondary | ICD-10-CM | POA: Insufficient documentation

## 2022-12-11 DIAGNOSIS — C50312 Malignant neoplasm of lower-inner quadrant of left female breast: Secondary | ICD-10-CM | POA: Insufficient documentation

## 2022-12-11 DIAGNOSIS — D649 Anemia, unspecified: Secondary | ICD-10-CM | POA: Diagnosis not present

## 2022-12-11 DIAGNOSIS — K219 Gastro-esophageal reflux disease without esophagitis: Secondary | ICD-10-CM | POA: Insufficient documentation

## 2022-12-11 DIAGNOSIS — Z923 Personal history of irradiation: Secondary | ICD-10-CM | POA: Diagnosis not present

## 2022-12-11 DIAGNOSIS — Z803 Family history of malignant neoplasm of breast: Secondary | ICD-10-CM | POA: Diagnosis not present

## 2022-12-11 DIAGNOSIS — Z79899 Other long term (current) drug therapy: Secondary | ICD-10-CM | POA: Diagnosis not present

## 2022-12-11 DIAGNOSIS — D0511 Intraductal carcinoma in situ of right breast: Secondary | ICD-10-CM | POA: Diagnosis not present

## 2022-12-11 DIAGNOSIS — Z170 Estrogen receptor positive status [ER+]: Secondary | ICD-10-CM | POA: Diagnosis not present

## 2022-12-11 DIAGNOSIS — I10 Essential (primary) hypertension: Secondary | ICD-10-CM | POA: Diagnosis not present

## 2022-12-11 DIAGNOSIS — Z87891 Personal history of nicotine dependence: Secondary | ICD-10-CM | POA: Diagnosis not present

## 2022-12-12 ENCOUNTER — Other Ambulatory Visit: Payer: Self-pay

## 2022-12-12 ENCOUNTER — Ambulatory Visit
Admission: RE | Admit: 2022-12-12 | Discharge: 2022-12-12 | Disposition: A | Discharge status: Home / Self Care | Payer: 59 | Source: Ambulatory Visit | Attending: Radiation Oncology | Admitting: Radiation Oncology

## 2022-12-12 DIAGNOSIS — Z510 Encounter for antineoplastic radiation therapy: Secondary | ICD-10-CM | POA: Diagnosis present

## 2022-12-12 DIAGNOSIS — C50312 Malignant neoplasm of lower-inner quadrant of left female breast: Secondary | ICD-10-CM | POA: Diagnosis present

## 2022-12-12 NOTE — Addendum Note (Signed)
Encounter addended by: Dorothy Puffer, MD on: 12/12/2022 8:38 AM  Actions taken: Edit attestation on clinical note

## 2022-12-17 ENCOUNTER — Inpatient Hospital Stay: Payer: 59 | Attending: Hematology and Oncology

## 2022-12-17 ENCOUNTER — Other Ambulatory Visit: Payer: Self-pay

## 2022-12-17 ENCOUNTER — Ambulatory Visit: Payer: 59 | Admitting: Hematology and Oncology

## 2022-12-17 VITALS — BP 127/86 | HR 114 | Temp 99.1°F | Resp 18

## 2022-12-17 DIAGNOSIS — C50312 Malignant neoplasm of lower-inner quadrant of left female breast: Secondary | ICD-10-CM | POA: Insufficient documentation

## 2022-12-17 DIAGNOSIS — C50912 Malignant neoplasm of unspecified site of left female breast: Secondary | ICD-10-CM

## 2022-12-17 DIAGNOSIS — Z5111 Encounter for antineoplastic chemotherapy: Secondary | ICD-10-CM | POA: Diagnosis present

## 2022-12-17 MED ORDER — GOSERELIN ACETATE 3.6 MG ~~LOC~~ IMPL
3.6000 mg | DRUG_IMPLANT | Freq: Once | SUBCUTANEOUS | Status: AC
  Administered 2022-12-17: 3.6 mg via SUBCUTANEOUS
  Filled 2022-12-17: qty 3.6

## 2022-12-18 ENCOUNTER — Ambulatory Visit: Payer: 59 | Attending: Hematology and Oncology | Admitting: Rehabilitation

## 2022-12-18 ENCOUNTER — Encounter: Payer: Self-pay | Admitting: Rehabilitation

## 2022-12-18 DIAGNOSIS — C50912 Malignant neoplasm of unspecified site of left female breast: Secondary | ICD-10-CM

## 2022-12-18 DIAGNOSIS — Z9189 Other specified personal risk factors, not elsewhere classified: Secondary | ICD-10-CM

## 2022-12-18 DIAGNOSIS — Z483 Aftercare following surgery for neoplasm: Secondary | ICD-10-CM | POA: Diagnosis present

## 2022-12-18 DIAGNOSIS — Z510 Encounter for antineoplastic radiation therapy: Secondary | ICD-10-CM | POA: Diagnosis not present

## 2022-12-18 NOTE — Therapy (Signed)
OUTPATIENT PHYSICAL THERAPY BREAST CANCER POST OP FOLLOW UP   Patient Name: [name] MRN: [id number] DOB:[birth year], 47 y.o., female Today's Date: 12/18/2022  END OF SESSION:  PT End of Session - 12/18/22 1254     Visit Number 2    Number of Visits 2    Date for PT Re-Evaluation 12/13/22    PT Start Time 1204    PT Stop Time 1234    PT Time Calculation (min) 30 min    Activity Tolerance Patient tolerated treatment well    Behavior During Therapy [facility] for tasks assessed/performed             Past Medical History:  Diagnosis Date   Anemia    Back pain    Bimalleolar ankle fracture    left   Breast cancer (HCC)    right breast- 2020   Breast cancer (HCC) 09/2022   left breast   Breast cancer (HCC) 10/2022   Left   Family history of breast cancer    GERD (gastroesophageal reflux disease)    Hypertension    no meds   Kidney infection    1998- hosp.[facility]   Personal history of radiation therapy    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, more than 10 years ago- no instances since   Past Surgical History:  Procedure Laterality Date   ANTERIOR LUMBAR FUSION  11/14/2011   Procedure: ANTERIOR LUMBAR FUSION 2 LEVELS;  Surgeon: Javier Docker, MD;  Location: [facility];  Service: Orthopedics;  Laterality: N/A;  ALIF L5-S1   BACK SURGERY     BREAST BIOPSY  11/13/2022   MM LT RADIOACTIVE SEED LOC MAMMO GUIDE 11/13/2022 [facility] MAMMOGRAPHY   BREAST LUMPECTOMY Right 01/2019   BREAST LUMPECTOMY WITH RADIOACTIVE SEED AND SENTINEL LYMPH NODE BIOPSY Left 11/15/2022   Procedure: LEFT BREAST LUMPECTOMY WITH RADIOACTIVE SEED AND SENTINEL LYMPH NODE BIOPSY;  Surgeon: Harriette Bouillon, MD;  Location: [facility];  Service: General;  Laterality: Left;   BREAST LUMPECTOMY WITH RADIOACTIVE SEED LOCALIZATION Right 01/13/2019   Procedure: RIGHT BREAST LUMPECTOMY WITH RADIOACTIVE SEED LOCALIZATION X3;  Surgeon: Harriette Bouillon, MD;  Location: [facility];  Service: General;  Laterality:  Right;   CESAREAN SECTION     2007- /w epidural  anesthesia    COLONOSCOPY     IRRIGATION AND DEBRIDEMENT ABSCESS Left 11/19/2022   Procedure: IRRIGATION AND DEBRIDEMENT LEFT AXILLA;  Surgeon: Fritzi Mandes, MD;  Location: [facility] OR;  Service: General;  Laterality: Left;   LUMBAR LAMINECTOMY/DECOMPRESSION MICRODISCECTOMY Right 12/15/2015   Procedure:  MICO LUMBER DECOMPRESSION L4-5 ON THE RIGHT, ;  Surgeon: Jene Every, MD;  Location: [facility];  Service: Orthopedics;  Laterality: Right;   ORIF ANKLE FRACTURE Left 10/03/2017   Procedure: OPEN REDUCTION INTERNAL FIXATION (ORIF) ANKLE BIMALLEOLAR FRACTURE; possible deltoid ligament repair;  Surgeon: Toni Arthurs, MD;  Location:  SURGERY CENTER;  Service: Orthopedics;  Laterality: Left;   SPINE SURGERY     fusion   UPPER GASTROINTESTINAL ENDOSCOPY     WISDOM TOOTH EXTRACTION     young adult   Patient Active Problem List   Diagnosis Date Noted   Hematoma of left axilla 11/19/2022   S/P debridement 11/19/2022   Invasive ductal carcinoma of breast, female, left (HCC) 10/15/2022   Fever, unspecified 03/23/2019   Essential hypertension 03/13/2019   Elevated liver enzymes 11/06/2018   Genetic testing 10/06/2018   Family history of breast cancer    Ductal carcinoma in situ (  DCIS) of right breast 09/19/2018   Dry eye 09/02/2017   Rectal bleed 05/20/2017   Elevated BP without diagnosis of hypertension 05/20/2017   Other dysphagia 02/15/2016   HNP (herniated nucleus pulposus), lumbar 12/15/2015   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 Zola Button   REFERRING PROVIDER: Dr. Al Pimple   REFERRING DIAG: left breast cancer  THERAPY DIAG:  Invasive ductal carcinoma of breast, female, left [facility])  Aftercare following surgery for neoplasm  At risk for lymphedema  Rationale for Evaluation and Treatment: Rehabilitation  ONSET DATE: 10/14/22  SUBJECTIVE:                                                                                                                                                                                           SUBJECTIVE STATEMENT: I had to get a drain line put in due to axillary edema.  Back to activities.  Radiation sim was fine.  Not wearing compression.   PERTINENT HISTORY:  Hx of Rt lumpectomy in 2020 for DCIS grade 3, radiation to the Rt breast. No nodes removed.  Recent Lt IDC ER/PR positive size of 5mm.  Lt lumpectomy and SLNB on 11/15/22 with 4 negative nodes removed and then irrigation and debridement of the axilla with drain placement on 11/19/22. Hx of lumbar fusion and disc removal.    PATIENT GOALS:  Reassess how my recovery is going related to arm function, pain, and swelling.  PAIN:  Are you having pain? 1/10 in the axilla   PRECAUTIONS: Recent Surgery, left UE Lymphedema risk  ACTIVITY LEVEL / LEISURE: back to work and all activities    OBJECTIVE:   PATIENT SURVEYS:  QUICK DASH: 11 % from 9%   OBSERVATIONS: Still some scabbing present on incisions but healing well. Cord palpated in the axilla but popped x 2 with gentle manual pressure and then it was not palpable.   POSTURE:  WNL  LYMPHEDEMA ASSESSMENT:  UPPER EXTREMITY AROM/PROM:   A/PROM RIGHT   eval    Shoulder extension 40  Shoulder flexion 162  Shoulder abduction 160  Shoulder internal rotation     Shoulder external rotation 96                          (Blank rows = not tested)   A/PROM LEFT   eval 12/18/22  Shoulder extension 42 50  Shoulder flexion 165 165  Shoulder abduction 165 160  Shoulder internal rotation     Shoulder external rotation 90 90                          (Blank rows = not tested)   UPPER EXTREMITY STRENGTH: 5/5 bil    LYMPHEDEMA ASSESSMENTS:    LANDMARK RIGHT   eval  10 cm proximal to olecranon process 25.2  Olecranon process 23  10 cm proximal to ulnar styloid process 19.5  Just proximal to ulnar styloid process 14.7  Across hand at thumb web space 17.8  At base of 2nd digit 6.2  (Blank rows = not tested)   LANDMARK LEFT   eval  10 cm proximal to olecranon process 25.2  Olecranon process 23  10 cm proximal to ulnar styloid process 18.8  Just proximal to ulnar styloid process 14.7  Across hand at thumb web space 18  At base of 2nd digit 6.3  (Blank rows = not tested)   PATIENT EDUCATION:  Education details: ABC class material as pt is unable to do due to work schedule, post op instruction below Person educated: Patient Education method: Programmer, multimedia, Facilities manager, and Handouts Education comprehension: verbalized understanding  HOME EXERCISE PROGRAM: Reviewed previously given post op HEP  ASSESSMENT:  CLINICAL IMPRESSION: Pt is doing well after lumpectomy complication needing drain placement.  Cording was noted x 1 in the axilla but this popped with gentle skin traction x 2 and then was not palpable.  She was educated on cording and when to return if this returns.  ABC class performed in person due to schedule.   Pt will benefit from skilled therapeutic intervention to improve on the following deficits: Decreased knowledge of precautions, impaired UE functional use, pain, decreased ROM, postural dysfunction.   PT treatment/interventions: ADL/Self care home management, Therapeutic exercises, Patient/Family education, Manual therapy, and  Re-evaluation   GOALS: Goals reviewed with patient? Yes  LONG TERM GOALS:  (STG=LTG)  GOALS Name Target Date  Goal status  1 Pt will demonstrate she has regained full shoulder ROM and function post operatively compared to baselines.  Baseline: 12/18/22 MET                    PLAN:  PT FREQUENCY/DURATION: SOZO only  PLAN FOR NEXT SESSION: SOZO only   [facility]  [address], [address]  [place] [address]  [phone]  After Breast Cancer Class It is recommended you attend the ABC class to be educated on lymphedema risk reduction. This class is free of charge and lasts for 1 hour. It is a 1-time class. You will need to download the TEAMS app either on your phone or computer. We will send you a link the night before or the morning of the class. You should be able to click on that link to join the class. This is not a confidential class. You don't have to turn your camera on, but other participants may be able to see your email address.  Scar massage You can begin gentle scar massage to you incision sites.  Gently place one hand on the incision and move the skin (without sliding on the skin) in various directions. Do this for a few minutes and then you can gently massage either coconut oil or vitamin E cream into the scars.  Compression garment You should continue wearing your compression bra until you feel like you no longer have swelling.  Home exercise Program Continue doing the exercises you were given until you feel like you can do them without feeling any tightness at the end.   Walking Program Studies show that 30 minutes of walking per day (fast enough to elevate your heart rate) can significantly reduce the risk of a cancer recurrence. If you can't walk due to other medical reasons, we encourage you to find another activity you could do (like a stationary bike or water exercise).  Posture After breast cancer surgery, people frequently sit  with rounded shoulders posture because it puts their incisions on slack and feels better. If you sit like this and scar tissue forms in that position, you can become very tight and have pain sitting or standing with good posture. Try to be aware of your posture and sit and stand up tall to heal properly.  Follow up PT: It is recommended you return every 3 months for the first 3 years following surgery to be assessed on the SOZO machine for an L-Dex score. This helps prevent clinically significant lymphedema in 95% of patients. These follow up screens are 10 minute appointments that you are not billed for.  Idamae Lusher, PT 12/18/2022, 12:55 PM

## 2022-12-18 NOTE — Patient Instructions (Signed)

## 2022-12-19 ENCOUNTER — Inpatient Hospital Stay: Payer: 59

## 2022-12-19 ENCOUNTER — Telehealth: Payer: Self-pay | Admitting: Hematology and Oncology

## 2022-12-19 ENCOUNTER — Encounter: Payer: Self-pay | Admitting: *Deleted

## 2022-12-19 DIAGNOSIS — C50912 Malignant neoplasm of unspecified site of left female breast: Secondary | ICD-10-CM

## 2022-12-19 NOTE — Telephone Encounter (Signed)
Spoke with patient confirming upcoming appointment  

## 2022-12-26 ENCOUNTER — Other Ambulatory Visit: Payer: Self-pay

## 2022-12-26 ENCOUNTER — Ambulatory Visit
Admission: RE | Admit: 2022-12-26 | Discharge: 2022-12-26 | Disposition: A | Discharge status: Home / Self Care | Payer: 59 | Source: Ambulatory Visit | Attending: Radiation Oncology | Admitting: Radiation Oncology

## 2022-12-26 DIAGNOSIS — Z510 Encounter for antineoplastic radiation therapy: Secondary | ICD-10-CM | POA: Diagnosis not present

## 2022-12-26 LAB — RAD ONC ARIA SESSION SUMMARY
Course Elapsed Days: 0
Plan Fractions Treated to Date: 1
Plan Prescribed Dose Per Fraction: 2.66 Gy
Plan Total Fractions Prescribed: 16
Plan Total Prescribed Dose: 42.56 Gy
Reference Point Dosage Given to Date: 2.66 Gy
Reference Point Session Dosage Given: 2.66 Gy
Session Number: 1

## 2022-12-27 ENCOUNTER — Other Ambulatory Visit: Payer: Self-pay

## 2022-12-27 ENCOUNTER — Ambulatory Visit
Admission: RE | Admit: 2022-12-27 | Discharge: 2022-12-27 | Disposition: A | Discharge status: Home / Self Care | Payer: 59 | Source: Ambulatory Visit | Attending: Radiation Oncology | Admitting: Radiation Oncology

## 2022-12-27 DIAGNOSIS — Z510 Encounter for antineoplastic radiation therapy: Secondary | ICD-10-CM | POA: Diagnosis not present

## 2022-12-27 LAB — RAD ONC ARIA SESSION SUMMARY
Course Elapsed Days: 1
Plan Fractions Treated to Date: 2
Plan Prescribed Dose Per Fraction: 2.66 Gy
Plan Total Fractions Prescribed: 16
Plan Total Prescribed Dose: 42.56 Gy
Reference Point Dosage Given to Date: 5.32 Gy
Reference Point Session Dosage Given: 2.66 Gy
Session Number: 2

## 2022-12-28 ENCOUNTER — Other Ambulatory Visit: Payer: Self-pay

## 2022-12-28 ENCOUNTER — Ambulatory Visit
Admission: RE | Admit: 2022-12-28 | Discharge: 2022-12-28 | Disposition: A | Discharge status: Home / Self Care | Payer: 59 | Source: Ambulatory Visit | Attending: Radiation Oncology | Admitting: Radiation Oncology

## 2022-12-28 DIAGNOSIS — Z510 Encounter for antineoplastic radiation therapy: Secondary | ICD-10-CM | POA: Diagnosis not present

## 2022-12-28 DIAGNOSIS — D0511 Intraductal carcinoma in situ of right breast: Secondary | ICD-10-CM

## 2022-12-28 LAB — RAD ONC ARIA SESSION SUMMARY
Course Elapsed Days: 2
Plan Fractions Treated to Date: 3
Plan Prescribed Dose Per Fraction: 2.66 Gy
Plan Total Fractions Prescribed: 16
Plan Total Prescribed Dose: 42.56 Gy
Reference Point Dosage Given to Date: 7.98 Gy
Reference Point Session Dosage Given: 2.66 Gy
Session Number: 3

## 2022-12-28 MED ORDER — RADIAPLEXRX EX GEL: Freq: Once | CUTANEOUS | Status: AC

## 2023-01-01 ENCOUNTER — Other Ambulatory Visit: Payer: Self-pay

## 2023-01-01 ENCOUNTER — Ambulatory Visit
Admission: RE | Admit: 2023-01-01 | Discharge: 2023-01-01 | Disposition: A | Discharge status: Home / Self Care | Payer: 59 | Source: Ambulatory Visit | Attending: Radiation Oncology | Admitting: Radiation Oncology

## 2023-01-01 DIAGNOSIS — Z510 Encounter for antineoplastic radiation therapy: Secondary | ICD-10-CM | POA: Diagnosis not present

## 2023-01-01 LAB — RAD ONC ARIA SESSION SUMMARY
Course Elapsed Days: 6
Plan Fractions Treated to Date: 4
Plan Prescribed Dose Per Fraction: 2.66 Gy
Plan Total Fractions Prescribed: 16
Plan Total Prescribed Dose: 42.56 Gy
Reference Point Dosage Given to Date: 10.64 Gy
Reference Point Session Dosage Given: 2.66 Gy
Session Number: 4

## 2023-01-02 ENCOUNTER — Other Ambulatory Visit: Payer: Self-pay

## 2023-01-02 ENCOUNTER — Ambulatory Visit
Admission: RE | Admit: 2023-01-02 | Discharge: 2023-01-02 | Disposition: A | Discharge status: Home / Self Care | Payer: 59 | Source: Ambulatory Visit | Attending: Radiation Oncology | Admitting: Radiation Oncology

## 2023-01-02 DIAGNOSIS — Z510 Encounter for antineoplastic radiation therapy: Secondary | ICD-10-CM | POA: Diagnosis not present

## 2023-01-02 LAB — RAD ONC ARIA SESSION SUMMARY
Course Elapsed Days: 7
Plan Fractions Treated to Date: 5
Plan Prescribed Dose Per Fraction: 2.66 Gy
Plan Total Fractions Prescribed: 16
Plan Total Prescribed Dose: 42.56 Gy
Reference Point Dosage Given to Date: 13.3 Gy
Reference Point Session Dosage Given: 2.66 Gy
Session Number: 5

## 2023-01-03 ENCOUNTER — Other Ambulatory Visit: Payer: Self-pay

## 2023-01-03 ENCOUNTER — Ambulatory Visit
Admission: RE | Admit: 2023-01-03 | Discharge: 2023-01-03 | Disposition: A | Discharge status: Home / Self Care | Payer: 59 | Source: Ambulatory Visit | Attending: Radiation Oncology | Admitting: Radiation Oncology

## 2023-01-03 DIAGNOSIS — Z510 Encounter for antineoplastic radiation therapy: Secondary | ICD-10-CM | POA: Diagnosis not present

## 2023-01-03 LAB — RAD ONC ARIA SESSION SUMMARY
Course Elapsed Days: 8
Plan Fractions Treated to Date: 6
Plan Prescribed Dose Per Fraction: 2.66 Gy
Plan Total Fractions Prescribed: 16
Plan Total Prescribed Dose: 42.56 Gy
Reference Point Dosage Given to Date: 15.96 Gy
Reference Point Session Dosage Given: 2.66 Gy
Session Number: 6

## 2023-01-04 ENCOUNTER — Other Ambulatory Visit: Payer: Self-pay

## 2023-01-04 ENCOUNTER — Ambulatory Visit
Admission: RE | Admit: 2023-01-04 | Discharge: 2023-01-04 | Disposition: A | Discharge status: Home / Self Care | Payer: 59 | Source: Ambulatory Visit | Attending: Radiation Oncology | Admitting: Radiation Oncology

## 2023-01-04 ENCOUNTER — Telehealth: Payer: Self-pay | Admitting: *Deleted

## 2023-01-04 DIAGNOSIS — Z510 Encounter for antineoplastic radiation therapy: Secondary | ICD-10-CM | POA: Diagnosis not present

## 2023-01-04 LAB — RAD ONC ARIA SESSION SUMMARY
Course Elapsed Days: 9
Plan Fractions Treated to Date: 7
Plan Prescribed Dose Per Fraction: 2.66 Gy
Plan Total Fractions Prescribed: 16
Plan Total Prescribed Dose: 42.56 Gy
Reference Point Dosage Given to Date: 18.62 Gy
Reference Point Session Dosage Given: 2.66 Gy
Session Number: 7

## 2023-01-04 NOTE — Telephone Encounter (Signed)
Pt left VM stating " I thought the Dr wanted to start me on a new anti estrogen " verified with MD- she is not to start new antiestrogen until after she completes radiation- and Dr Al Pimple will discuss and order it at her visit on 6/20.  Called and informed the patient.

## 2023-01-07 ENCOUNTER — Other Ambulatory Visit: Payer: Self-pay

## 2023-01-07 ENCOUNTER — Ambulatory Visit
Admission: RE | Admit: 2023-01-07 | Discharge: 2023-01-07 | Disposition: A | Discharge status: Home / Self Care | Payer: 59 | Source: Ambulatory Visit | Attending: Radiation Oncology | Admitting: Radiation Oncology

## 2023-01-07 DIAGNOSIS — C50312 Malignant neoplasm of lower-inner quadrant of left female breast: Secondary | ICD-10-CM | POA: Insufficient documentation

## 2023-01-07 DIAGNOSIS — Z5111 Encounter for antineoplastic chemotherapy: Secondary | ICD-10-CM | POA: Diagnosis present

## 2023-01-07 DIAGNOSIS — M8580 Other specified disorders of bone density and structure, unspecified site: Secondary | ICD-10-CM | POA: Diagnosis not present

## 2023-01-07 DIAGNOSIS — Z510 Encounter for antineoplastic radiation therapy: Secondary | ICD-10-CM | POA: Insufficient documentation

## 2023-01-07 DIAGNOSIS — Z170 Estrogen receptor positive status [ER+]: Secondary | ICD-10-CM | POA: Diagnosis not present

## 2023-01-07 LAB — RAD ONC ARIA SESSION SUMMARY
Course Elapsed Days: 12
Plan Fractions Treated to Date: 8
Plan Prescribed Dose Per Fraction: 2.66 Gy
Plan Total Fractions Prescribed: 16
Plan Total Prescribed Dose: 42.56 Gy
Reference Point Dosage Given to Date: 21.28 Gy
Reference Point Session Dosage Given: 2.66 Gy
Session Number: 8

## 2023-01-08 ENCOUNTER — Other Ambulatory Visit: Payer: Self-pay

## 2023-01-08 ENCOUNTER — Ambulatory Visit
Admission: RE | Admit: 2023-01-08 | Discharge: 2023-01-08 | Disposition: A | Discharge status: Home / Self Care | Payer: 59 | Source: Ambulatory Visit | Attending: Radiation Oncology | Admitting: Radiation Oncology

## 2023-01-08 ENCOUNTER — Inpatient Hospital Stay: Payer: 59 | Attending: Hematology and Oncology | Admitting: Licensed Clinical Social Worker

## 2023-01-08 DIAGNOSIS — C50312 Malignant neoplasm of lower-inner quadrant of left female breast: Secondary | ICD-10-CM | POA: Insufficient documentation

## 2023-01-08 DIAGNOSIS — M8580 Other specified disorders of bone density and structure, unspecified site: Secondary | ICD-10-CM | POA: Insufficient documentation

## 2023-01-08 DIAGNOSIS — Z5111 Encounter for antineoplastic chemotherapy: Secondary | ICD-10-CM | POA: Insufficient documentation

## 2023-01-08 DIAGNOSIS — Z510 Encounter for antineoplastic radiation therapy: Secondary | ICD-10-CM | POA: Insufficient documentation

## 2023-01-08 DIAGNOSIS — Z170 Estrogen receptor positive status [ER+]: Secondary | ICD-10-CM | POA: Insufficient documentation

## 2023-01-08 LAB — RAD ONC ARIA SESSION SUMMARY
Course Elapsed Days: 13
Plan Fractions Treated to Date: 9
Plan Prescribed Dose Per Fraction: 2.66 Gy
Plan Total Fractions Prescribed: 16
Plan Total Prescribed Dose: 42.56 Gy
Reference Point Dosage Given to Date: 23.94 Gy
Reference Point Session Dosage Given: 2.66 Gy
Session Number: 9

## 2023-01-08 NOTE — Progress Notes (Signed)
[facility] Clinical Social Work  Clinical Social Work was referred by  Building control surveyor  for assessment of psychosocial needs related to job loss.  Clinical Social Worker contacted patient by phone to offer support and assess for needs.  Patient has been let go from her job and is most concerned about insurance coverage as she is not sure when her coverage will end. She has not received any written information yet and has not yet heard back from HR (reached out this morning).   CSW and pt discussed options of looking into [place] Medicaid (CSW referring to [employer]. today for further information) and insurance through the marketplace.  E-mailed information to pt's husband per pt's request.  CSW also informed pt of resources through Triage Cancer and Cancer and Careers.  CSW provided direct contact information and encouraged pt to reach out with questions or further resource or support needs.     Thiago Ragsdale E Apollonia Amini, LCSW  Clinical Social Worker [facility]

## 2023-01-09 ENCOUNTER — Ambulatory Visit
Admission: RE | Admit: 2023-01-09 | Discharge: 2023-01-09 | Disposition: A | Discharge status: Home / Self Care | Payer: 59 | Source: Ambulatory Visit | Attending: Radiation Oncology | Admitting: Radiation Oncology

## 2023-01-09 ENCOUNTER — Telehealth: Payer: Self-pay

## 2023-01-09 ENCOUNTER — Other Ambulatory Visit: Payer: Self-pay

## 2023-01-09 DIAGNOSIS — Z510 Encounter for antineoplastic radiation therapy: Secondary | ICD-10-CM | POA: Diagnosis not present

## 2023-01-09 LAB — RAD ONC ARIA SESSION SUMMARY
Course Elapsed Days: 14
Plan Fractions Treated to Date: 10
Plan Prescribed Dose Per Fraction: 2.66 Gy
Plan Total Fractions Prescribed: 16
Plan Total Prescribed Dose: 42.56 Gy
Reference Point Dosage Given to Date: 26.6 Gy
Reference Point Session Dosage Given: 2.66 Gy
Session Number: 10

## 2023-01-09 NOTE — Telephone Encounter (Signed)
Attempted to contact patient regarding completing [place] Medicaid Application. Left message on voicemail requesting a return call.

## 2023-01-10 ENCOUNTER — Ambulatory Visit
Admission: RE | Admit: 2023-01-10 | Discharge: 2023-01-10 | Disposition: A | Discharge status: Home / Self Care | Payer: 59 | Source: Ambulatory Visit | Attending: Radiation Oncology | Admitting: Radiation Oncology

## 2023-01-10 ENCOUNTER — Other Ambulatory Visit: Payer: Self-pay

## 2023-01-10 DIAGNOSIS — Z510 Encounter for antineoplastic radiation therapy: Secondary | ICD-10-CM | POA: Diagnosis not present

## 2023-01-10 LAB — RAD ONC ARIA SESSION SUMMARY
Course Elapsed Days: 15
Plan Fractions Treated to Date: 11
Plan Prescribed Dose Per Fraction: 2.66 Gy
Plan Total Fractions Prescribed: 16
Plan Total Prescribed Dose: 42.56 Gy
Reference Point Dosage Given to Date: 29.26 Gy
Reference Point Session Dosage Given: 2.66 Gy
Session Number: 11

## 2023-01-11 ENCOUNTER — Other Ambulatory Visit: Payer: Self-pay

## 2023-01-11 ENCOUNTER — Ambulatory Visit
Admission: RE | Admit: 2023-01-11 | Discharge: 2023-01-11 | Disposition: A | Discharge status: Home / Self Care | Payer: 59 | Source: Ambulatory Visit | Attending: Radiation Oncology | Admitting: Radiation Oncology

## 2023-01-11 ENCOUNTER — Ambulatory Visit: Payer: 59 | Admitting: Radiation Oncology

## 2023-01-11 DIAGNOSIS — Z510 Encounter for antineoplastic radiation therapy: Secondary | ICD-10-CM | POA: Diagnosis not present

## 2023-01-11 LAB — RAD ONC ARIA SESSION SUMMARY
Course Elapsed Days: 16
Plan Fractions Treated to Date: 12
Plan Prescribed Dose Per Fraction: 2.66 Gy
Plan Total Fractions Prescribed: 16
Plan Total Prescribed Dose: 42.56 Gy
Reference Point Dosage Given to Date: 31.92 Gy
Reference Point Session Dosage Given: 2.66 Gy
Session Number: 12

## 2023-01-14 ENCOUNTER — Ambulatory Visit
Admission: RE | Admit: 2023-01-14 | Discharge: 2023-01-14 | Disposition: A | Discharge status: Home / Self Care | Payer: 59 | Source: Ambulatory Visit | Attending: Radiation Oncology | Admitting: Radiation Oncology

## 2023-01-14 ENCOUNTER — Other Ambulatory Visit: Payer: Self-pay

## 2023-01-14 DIAGNOSIS — Z510 Encounter for antineoplastic radiation therapy: Secondary | ICD-10-CM | POA: Diagnosis not present

## 2023-01-14 LAB — RAD ONC ARIA SESSION SUMMARY
Course Elapsed Days: 19
Plan Fractions Treated to Date: 13
Plan Prescribed Dose Per Fraction: 2.66 Gy
Plan Total Fractions Prescribed: 16
Plan Total Prescribed Dose: 42.56 Gy
Reference Point Dosage Given to Date: 34.58 Gy
Reference Point Session Dosage Given: 2.66 Gy
Session Number: 13

## 2023-01-15 ENCOUNTER — Ambulatory Visit
Admission: RE | Admit: 2023-01-15 | Discharge: 2023-01-15 | Disposition: A | Discharge status: Home / Self Care | Payer: 59 | Source: Ambulatory Visit | Attending: Radiation Oncology | Admitting: Radiation Oncology

## 2023-01-15 ENCOUNTER — Other Ambulatory Visit: Payer: Self-pay

## 2023-01-15 DIAGNOSIS — Z510 Encounter for antineoplastic radiation therapy: Secondary | ICD-10-CM | POA: Diagnosis not present

## 2023-01-15 LAB — RAD ONC ARIA SESSION SUMMARY
Course Elapsed Days: 20
Plan Fractions Treated to Date: 14
Plan Prescribed Dose Per Fraction: 2.66 Gy
Plan Total Fractions Prescribed: 16
Plan Total Prescribed Dose: 42.56 Gy
Reference Point Dosage Given to Date: 37.24 Gy
Reference Point Session Dosage Given: 2.66 Gy
Session Number: 14

## 2023-01-16 ENCOUNTER — Inpatient Hospital Stay: Payer: 59

## 2023-01-16 ENCOUNTER — Other Ambulatory Visit: Payer: Self-pay

## 2023-01-16 ENCOUNTER — Ambulatory Visit
Admission: RE | Admit: 2023-01-16 | Discharge: 2023-01-16 | Disposition: A | Discharge status: Home / Self Care | Payer: 59 | Source: Ambulatory Visit | Attending: Radiation Oncology | Admitting: Radiation Oncology

## 2023-01-16 DIAGNOSIS — Z510 Encounter for antineoplastic radiation therapy: Secondary | ICD-10-CM | POA: Diagnosis not present

## 2023-01-16 LAB — RAD ONC ARIA SESSION SUMMARY
Course Elapsed Days: 21
Plan Fractions Treated to Date: 15
Plan Prescribed Dose Per Fraction: 2.66 Gy
Plan Total Fractions Prescribed: 16
Plan Total Prescribed Dose: 42.56 Gy
Reference Point Dosage Given to Date: 39.9 Gy
Reference Point Session Dosage Given: 2.66 Gy
Session Number: 15

## 2023-01-17 ENCOUNTER — Other Ambulatory Visit: Payer: Self-pay

## 2023-01-17 ENCOUNTER — Ambulatory Visit
Admission: RE | Admit: 2023-01-17 | Discharge: 2023-01-17 | Disposition: A | Discharge status: Home / Self Care | Payer: 59 | Source: Ambulatory Visit | Attending: Radiation Oncology | Admitting: Radiation Oncology

## 2023-01-17 DIAGNOSIS — Z510 Encounter for antineoplastic radiation therapy: Secondary | ICD-10-CM | POA: Diagnosis not present

## 2023-01-17 LAB — RAD ONC ARIA SESSION SUMMARY
Course Elapsed Days: 22
Plan Fractions Treated to Date: 16
Plan Prescribed Dose Per Fraction: 2.66 Gy
Plan Total Fractions Prescribed: 16
Plan Total Prescribed Dose: 42.56 Gy
Reference Point Dosage Given to Date: 42.56 Gy
Reference Point Session Dosage Given: 2.66 Gy
Session Number: 16

## 2023-01-18 ENCOUNTER — Ambulatory Visit
Admission: RE | Admit: 2023-01-18 | Discharge: 2023-01-18 | Disposition: A | Discharge status: Home / Self Care | Payer: 59 | Source: Ambulatory Visit | Attending: Radiation Oncology | Admitting: Radiation Oncology

## 2023-01-18 ENCOUNTER — Telehealth: Payer: Self-pay | Admitting: Hematology and Oncology

## 2023-01-18 ENCOUNTER — Other Ambulatory Visit: Payer: Self-pay

## 2023-01-18 DIAGNOSIS — Z510 Encounter for antineoplastic radiation therapy: Secondary | ICD-10-CM | POA: Diagnosis not present

## 2023-01-18 LAB — RAD ONC ARIA SESSION SUMMARY
Course Elapsed Days: 23
Plan Fractions Treated to Date: 1
Plan Prescribed Dose Per Fraction: 2 Gy
Plan Total Fractions Prescribed: 4
Plan Total Prescribed Dose: 8 Gy
Reference Point Dosage Given to Date: 2 Gy
Reference Point Session Dosage Given: 2 Gy
Session Number: 17

## 2023-01-18 NOTE — Telephone Encounter (Signed)
Called patient twice, left a message regarding new appointment time/date

## 2023-01-21 ENCOUNTER — Other Ambulatory Visit: Payer: Self-pay

## 2023-01-21 ENCOUNTER — Ambulatory Visit
Admission: RE | Admit: 2023-01-21 | Discharge: 2023-01-21 | Disposition: A | Discharge status: Home / Self Care | Payer: 59 | Source: Ambulatory Visit | Attending: Radiation Oncology | Admitting: Radiation Oncology

## 2023-01-21 ENCOUNTER — Inpatient Hospital Stay: Payer: 59

## 2023-01-21 VITALS — BP 119/88 | HR 107 | Temp 99.6°F | Resp 18

## 2023-01-21 DIAGNOSIS — Z510 Encounter for antineoplastic radiation therapy: Secondary | ICD-10-CM | POA: Diagnosis not present

## 2023-01-21 DIAGNOSIS — C50912 Malignant neoplasm of unspecified site of left female breast: Secondary | ICD-10-CM

## 2023-01-21 LAB — RAD ONC ARIA SESSION SUMMARY
Course Elapsed Days: 26
Plan Fractions Treated to Date: 2
Plan Prescribed Dose Per Fraction: 2 Gy
Plan Total Fractions Prescribed: 4
Plan Total Prescribed Dose: 8 Gy
Reference Point Dosage Given to Date: 4 Gy
Reference Point Session Dosage Given: 2 Gy
Session Number: 18

## 2023-01-21 MED ORDER — GOSERELIN ACETATE 3.6 MG ~~LOC~~ IMPL
3.6000 mg | DRUG_IMPLANT | Freq: Once | SUBCUTANEOUS | Status: AC
  Administered 2023-01-21: 3.6 mg via SUBCUTANEOUS
  Filled 2023-01-21: qty 3.6

## 2023-01-22 ENCOUNTER — Other Ambulatory Visit: Payer: Self-pay

## 2023-01-22 ENCOUNTER — Ambulatory Visit
Admission: RE | Admit: 2023-01-22 | Discharge: 2023-01-22 | Disposition: A | Discharge status: Home / Self Care | Payer: 59 | Source: Ambulatory Visit | Attending: Radiation Oncology | Admitting: Radiation Oncology

## 2023-01-22 DIAGNOSIS — Z510 Encounter for antineoplastic radiation therapy: Secondary | ICD-10-CM | POA: Diagnosis not present

## 2023-01-22 LAB — RAD ONC ARIA SESSION SUMMARY
Course Elapsed Days: 27
Plan Fractions Treated to Date: 3
Plan Prescribed Dose Per Fraction: 2 Gy
Plan Total Fractions Prescribed: 4
Plan Total Prescribed Dose: 8 Gy
Reference Point Dosage Given to Date: 6 Gy
Reference Point Session Dosage Given: 2 Gy
Session Number: 19

## 2023-01-23 ENCOUNTER — Ambulatory Visit
Admission: RE | Admit: 2023-01-23 | Discharge: 2023-01-23 | Disposition: A | Discharge status: Home / Self Care | Payer: 59 | Source: Ambulatory Visit | Attending: Radiation Oncology | Admitting: Radiation Oncology

## 2023-01-23 ENCOUNTER — Other Ambulatory Visit: Payer: Self-pay

## 2023-01-23 DIAGNOSIS — Z510 Encounter for antineoplastic radiation therapy: Secondary | ICD-10-CM | POA: Diagnosis not present

## 2023-01-23 LAB — RAD ONC ARIA SESSION SUMMARY
Course Elapsed Days: 28
Plan Fractions Treated to Date: 4
Plan Prescribed Dose Per Fraction: 2 Gy
Plan Total Fractions Prescribed: 4
Plan Total Prescribed Dose: 8 Gy
Reference Point Dosage Given to Date: 8 Gy
Reference Point Session Dosage Given: 2 Gy
Session Number: 20

## 2023-01-24 ENCOUNTER — Inpatient Hospital Stay (HOSPITAL_BASED_OUTPATIENT_CLINIC_OR_DEPARTMENT_OTHER): Payer: 59 | Admitting: Hematology and Oncology

## 2023-01-24 VITALS — BP 141/109 | HR 105 | Temp 97.9°F | Resp 18 | Wt 138.0 lb

## 2023-01-24 DIAGNOSIS — D539 Nutritional anemia, unspecified: Secondary | ICD-10-CM | POA: Diagnosis not present

## 2023-01-24 DIAGNOSIS — C50912 Malignant neoplasm of unspecified site of left female breast: Secondary | ICD-10-CM | POA: Diagnosis not present

## 2023-01-24 DIAGNOSIS — Z510 Encounter for antineoplastic radiation therapy: Secondary | ICD-10-CM | POA: Diagnosis not present

## 2023-01-24 MED ORDER — ANASTROZOLE 1 MG PO TABS: 1.0000 mg | ORAL_TABLET | Freq: Every day | ORAL | 3 refills | Status: DC

## 2023-01-24 NOTE — Radiation Completion Notes (Addendum)
  Radiation Oncology         [phone] ________________________________  Name: [name] MRN: [id number]  Date of Service: 01/23/2023  DOB: January 15, 1976  End of Treatment Note  Diagnosis:  Stage IA, [id number], grade 1, ER/PR positive invasive ductal carcinoma of the left breast.   Intent: Curative     ==========DELIVERED PLANS==========  First Treatment Date: 2022-12-26 - Last Treatment Date: 2023-01-23   Plan Name: Breast_L_BH Site: Breast, Left Technique: 3D Mode: Photon Dose Per Fraction: 2.66 Gy Prescribed Dose (Delivered / Prescribed): 42.56 Gy / 42.56 Gy Prescribed Fxs (Delivered / Prescribed): 16 / 16   Plan Name: Brst_L_Bst_BH:1 Site: Breast, Left Technique: 3D Mode: Photon Dose Per Fraction: 2 Gy Prescribed Dose (Delivered / Prescribed): 8 Gy / 8 Gy Prescribed Fxs (Delivered / Prescribed): 4 / 4     ==========ON TREATMENT VISIT DATES========== 2022-12-28, 2023-01-04, 2023-01-11, 2023-01-18, 2023-01-23    See weekly On Treatment Notes in Epic for details.  The patient tolerated radiation. She developed fatigue and anticipated skin changes in the treatment field.   The patient will receive a call in about one month from the radiation oncology department. She will continue follow up with Dr. Al Pimple as well.      Osker Mason, PAC

## 2023-01-24 NOTE — Progress Notes (Signed)
[facility]  [phone] [phone]) [phone]    ID: Rozan Tateishi Munce DOB: [birth year]  MR#: [id number]  JYN#:[id number]  Patient Care Team: Zola Button, Grayling Congress, DO as PCP - General Jene Every, MD (Orthopedic Surgery) Donnelly Angelica, RN as Oncology Nurse Navigator Harriette Bouillon, MD as Consulting Physician (General Surgery) Dorothy Puffer, MD as Consulting Physician (Radiation Oncology) Harold Hedge, MD as Consulting Physician (Obstetrics and Gynecology) Toni Arthurs, MD as Consulting Physician (Orthopedic Surgery) Pershing Proud, RN as Oncology Nurse Navigator Rachel Moulds, MD as Consulting Physician (Hematology and Oncology) OTHER MD:   CHIEF COMPLAINT: IDC  CURRENT TREATMENT: Zoladex and anastrozole  INTERVAL HISTORY:  Ms. Foxworthy is here for follow-up after her adjuvant radiation.  She understandably feels tired from the radiation.  Her skin is very raw on the left side and she has been feeling a lot of pain and discomfort.  She has completed her radiation today.  She is here to initiate antiestrogen therapy. She has been having some hot flashes and vaginal dryness already.  Her dad was on the phone for the initial part of the conversation.  REVIEW OF SYSTEMS: Rest of the pertinent 10 point ROS reviewed and negative  HISTORY OF CURRENT ILLNESS: From the original intake note:  "[name]" Orenda Nie underwent bilateral diagnostic mammography with tomography at [facility] on 09/12/2018 showing: Breast Density Category C.   In the right breast, magnified views demonstrate a group of fine pleomorphic calcifications in the lower outer quadrant of the right breast spanning 2.8 x 1.7 x 5.0 cm. Calcifications are not typical for fat necrosis.   Accordingly on 09/17/2018 she proceeded to biopsy of the right breast area in question. The pathology from this procedure showed ([id number]): ductal carcinoma in situ with  calcifications, high grade. Prognostic indicators significant for: estrogen receptor, 0% negative and progesterone receptor, 0% negative.   In the left breast, magnified views of the left breast calcifications demonstrate a group of faint pleomorphic calcifications in the upper-outer quadrant measuring approximately 0.8 cm in diameter. Calcifications are not typical for fat necrosis.   Accordingly, she underwent a biopsy of the right breast area in question on 09/17/2018. The pathology from this procedure showed ([id number]): fibrocystic changes with adenosis and calcifications. There is no evidence of malignancy.    The patient's subsequent history is as detailed below.   PAST MEDICAL HISTORY: Past Medical History:  Diagnosis Date   Anemia    Back pain    Bimalleolar ankle fracture    left   Breast cancer (HCC)    right breast- 2020   Breast cancer (HCC) 09/2022   left breast   Breast cancer (HCC) 10/2022   Left   Family history of breast cancer    GERD (gastroesophageal reflux disease)    Hypertension    no meds   Kidney infection    1998- hosp[facility]   Personal history of radiation therapy    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, more than 10 years ago- no instances since    PAST SURGICAL HISTORY: Past Surgical History:  Procedure Laterality Date   ANTERIOR LUMBAR FUSION  11/14/2011   Procedure: ANTERIOR LUMBAR FUSION 2 LEVELS;  Surgeon: Javier Docker, MD;  Location: [facility] OR;  Service: Orthopedics;  Laterality: N/A;  ALIF L5-S1   BACK SURGERY     BREAST BIOPSY  11/13/2022   MM LT RADIOACTIVE SEED LOC MAMMO GUIDE 11/13/2022 [facility] MAMMOGRAPHY   BREAST LUMPECTOMY Right  01/2019   BREAST LUMPECTOMY WITH RADIOACTIVE SEED AND SENTINEL LYMPH NODE BIOPSY Left 11/15/2022   Procedure: LEFT BREAST LUMPECTOMY WITH RADIOACTIVE SEED AND SENTINEL LYMPH NODE BIOPSY;  Surgeon: Harriette Bouillon, MD;  Location: [facility] OR;  Service: General;  Laterality: Left;   BREAST LUMPECTOMY WITH RADIOACTIVE SEED  LOCALIZATION Right 01/13/2019   Procedure: RIGHT BREAST LUMPECTOMY WITH RADIOACTIVE SEED LOCALIZATION X3;  Surgeon: Harriette Bouillon, MD;  Location: [facility];  Service: General;  Laterality: Right;   CESAREAN SECTION     2007- /w epidural  anesthesia    COLONOSCOPY     IRRIGATION AND DEBRIDEMENT ABSCESS Left 11/19/2022   Procedure: IRRIGATION AND DEBRIDEMENT LEFT AXILLA;  Surgeon: Fritzi Mandes, MD;  Location: [facility] OR;  Service: General;  Laterality: Left;   LUMBAR LAMINECTOMY/DECOMPRESSION MICRODISCECTOMY Right 12/15/2015   Procedure:  MICO LUMBER DECOMPRESSION L4-5 ON THE RIGHT, ;  Surgeon: Jene Every, MD;  Location: [facility];  Service: Orthopedics;  Laterality: Right;   ORIF ANKLE FRACTURE Left 10/03/2017   Procedure: OPEN REDUCTION INTERNAL FIXATION (ORIF) ANKLE BIMALLEOLAR FRACTURE; possible deltoid ligament repair;  Surgeon: Toni Arthurs, MD;  Location: [facility];  Service: Orthopedics;  Laterality: Left;   SPINE SURGERY     fusion   UPPER GASTROINTESTINAL ENDOSCOPY     WISDOM TOOTH EXTRACTION     young adult    FAMILY HISTORY: Family History  Problem Relation Age of Onset   Lupus Mother    Breast cancer Maternal Aunt    Anesthesia problems Neg Hx    Colon cancer Neg Hx    Colon polyps Neg Hx    Esophageal cancer Neg Hx    Rectal cancer Neg Hx    Stomach cancer Neg Hx   [name] are both 52 as of 09/2018. The patient has 2 sisters. [name] paternal aunt was diagnosed with breast cancer at 95. Patient denies anyone in her family having breast, ovarian, prostate, or pancreatic cancer.    GYNECOLOGIC HISTORY:  No LMP recorded. (Menstrual status: IUD). 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 Their son, Danaysha Wais, is 9 years old as of December 2022. [name], Dorene Sorrow, is a retired Stage manager.    ADVANCED DIRECTIVES: Her husband, BJ, is automatically her healthcare power of attorney.     HEALTH MAINTENANCE: Social History   Tobacco Use   Smoking status: Former    Packs/day: 0.25    Years: 15.00    Additional pack years: 0.00    Total pack years: 3.75    Types: Cigarettes    Quit date: 04/01/2017    Years since quitting: 5.8   Smokeless tobacco: Never   Tobacco comments:    vapes daily  Vaping Use   Vaping Use: Every day   Substances: Nicotine  Substance Use Topics   Alcohol use: Yes    Alcohol/week: 1.0 standard drink of alcohol    Types: 1 Glasses of wine per week    Comment: maybe 1 glass of wine per week, if that   Drug use: No    Colonoscopy: yes, 2005  PAP: 09/12/2018  Bone density: no   Allergies  Allergen Reactions   Tamiflu [Oseltamivir Phosphate]  Other (See Comments)    Seizure   Cipro [Ciprofloxacin Hcl] Diarrhea and Nausea And Vomiting   Reglan [Metoclopramide] Other (See Comments)    "Lock jaw"   Sulfa Antibiotics Nausea And Vomiting    Current Outpatient Medications  Medication Sig Dispense Refill   acetaminophen (TYLENOL) 500 MG tablet Take 1,000 mg by mouth every 6 (six) hours as needed for moderate pain.     ELDERBERRY PO Take 1 tablet by mouth 2 (two) times a week.     goserelin (ZOLADEX) 3.6 MG injection Inject 3.6 mg into the skin every 28 (twenty-eight) days.     losartan (COZAAR) 25 MG tablet Take 1 tablet (25 mg total) by mouth daily. 30 tablet 0   omeprazole (PRILOSEC) 40 MG capsule TAKE 1 CAPSULE BY MOUTH TWICE A DAY 180 capsule 0   valACYclovir (VALTREX) 1000 MG tablet Take 1 tablet (1,000 mg total) by mouth 3 (three) times daily as needed. For flare ups (Patient not taking: Reported on 11/19/2022) 90 tablet 2   No current facility-administered medications for this visit.    OBJECTIVE: white  woman in no acute distress  There were no vitals filed for this visit.     There is no height or weight on file to calculate BMI.   Wt Readings from Last 3 Encounters:  12/11/22 136 lb 8 oz (61.9 kg)  11/15/22 136 lb (61.7 kg)  11/07/22 136 lb 4.8 oz (61.8 kg)     ECOG FS:1 - Symptomatic but completely ambulatory   Physical Exam Constitutional:      Appearance: Normal appearance.  Chest:     Comments: Left breast with erythema and desquamation from recent radiation Neurological:     Mental Status: She is alert.      LAB RESULTS:  CMP     Component Value Date/Time   NA 135 11/19/2022 1352   K 3.2 (L) 11/19/2022 1352   CL 99 11/19/2022 1352   CO2 23 11/19/2022 1352   GLUCOSE 197 (H) 11/19/2022 1352   BUN 7 11/19/2022 1352   CREATININE 0.63 11/19/2022 1352   CREATININE 0.78 07/27/2019 1133   CALCIUM 9.0 11/19/2022 1352   PROT 7.8 11/19/2022 1352   ALBUMIN 4.2 11/19/2022 1352   AST 178 (H) 11/19/2022 1352   AST 89 (H) 07/27/2019 1133   ALT 51 (H) 11/19/2022 1352   ALT 56 (H) 07/27/2019 1133   ALKPHOS 52 11/19/2022 1352   BILITOT 1.7 (H) 11/19/2022 1352   BILITOT 0.6 07/27/2019 1133   GFRNONAA >60 11/19/2022 1352   GFRNONAA >60 07/27/2019 1133   GFRAA >60 04/05/2020 1011   GFRAA >60 07/27/2019 1133    No results found for: "TOTALPROTELP", "ALBUMINELP", "A1GS", "A2GS", "BETS", "BETA2SER", "GAMS", "MSPIKE", "SPEI"  No results found for: "KPAFRELGTCHN", "LAMBDASER", "KAPLAMBRATIO"  Lab Results  Component Value Date   WBC 8.3 11/20/2022   NEUTROABS 5.1 11/19/2022   HGB 10.5 (L) 11/20/2022   HCT 29.6 (L) 11/20/2022   MCV 117.9 (H) 11/20/2022   PLT 161 11/20/2022   No results found for: "LABCA2"  No components found for: "WUJWJX914"  No results for input(s): "INR" in the last 168 hours.  No results found for: "LABCA2"  No results found for: "NWG956"  No results found for: "CAN125"  No results found for: "CAN153"  No results found for:  "CA2729"  No components found for: "HGQUANT"  No results found for: "CEA1", "CEA" / No results found for: "CEA1", "CEA"   No results found for: "AFPTUMOR"  No results found for: "CHROMOGRNA"  No results found for: "TOTALPROTELP", "ALBUMINELP", "A1GS", "A2GS", "BETS", "BETA2SER", "GAMS", "MSPIKE", "SPEI" (this displays SPEP labs)  No results found for: "KPAFRELGTCHN", "LAMBDASER", "KAPLAMBRATIO" (kappa/lambda light chains)  No results found for: "HGBA", "HGBA2QUANT", "HGBFQUANT", "HGBSQUAN" (Hemoglobinopathy evaluation)   No results found for: "LDH"  No results found for: "IRON", "TIBC", "IRONPCTSAT" (Iron and TIBC)  No results found for: "FERRITIN"  Urinalysis    Component Value Date/Time   COLORURINE YELLOW 11/08/2011 0912   APPEARANCEUR CLEAR 11/08/2011 0912   LABSPEC 1.023 11/08/2011 0912   PHURINE 5.0 11/08/2011 0912   GLUCOSEU NEGATIVE 11/08/2011 0912   GLUCOSEU NEG mg/dL 21/30/8657 8469   HGBUR NEGATIVE 11/08/2011 0912   HGBUR large 06/01/2009 1449   BILIRUBINUR small (A) 03/20/2019 1142   BILIRUBINUR neg 01/20/2018 1751   KETONESUR negative 03/20/2019 1142   KETONESUR NEGATIVE 11/08/2011 0912   PROTEINUR =30 (A) 03/20/2019 1142   PROTEINUR Positive (A) 01/20/2018 1751   PROTEINUR NEGATIVE 11/08/2011 0912   UROBILINOGEN 2.0 (A) 03/20/2019 1142   UROBILINOGEN 0.2 11/08/2011 0912   NITRITE Positive (A) 03/20/2019 1142   NITRITE neg 01/20/2018 1751   NITRITE NEGATIVE 11/08/2011 0912   LEUKOCYTESUR Large (3+) (A) 03/20/2019 1142    STUDIES:  No results found.   ELIGIBLE FOR AVAILABLE RESEARCH PROTOCOL: no   ASSESSMENT: 47 y.o. [place], [place] woman that is post right breast biopsy 09/17/2018 for a ductal carcinoma in situ, grade 3, estrogen and progesterone receptor negative  (a) biopsy of a suspicious area in the left breast 09/17/2018 was benign  (b) additional right breast biopsies obtained 10/14/2018, both from the lower outer quadrant, showed ductal  carcinoma in situ, high to intermediate grade, 1 of which was estrogen and progesterone receptor negative, the other one strongly estrogen and progesterone receptor positive  (1) genetic testing on 09/24/2018 through [facility]'s Breast Cancer STAT Panel + Common Hereditary Cancers Panel showed no pathogenic mutations. The STAT Breast cancer panel offered by [facility] includes sequencing and rearrangement analysis for the following 9 genes:  ATM, BRCA1, BRCA2, CDH1, CHEK2, PALB2, PTEN, STK11 and TP53. The Common Hereditary Cancers Panel offered by [facility] includes sequencing and/or deletion duplication testing of the following 47 genes: APC, ATM, AXIN2, BARD1, BMPR1A, BRCA1, BRCA2, BRIP1, CDH1, CDKN2A (p14ARF), CDKN2A (p16INK4a), CKD4, CHEK2, CTNNA1, DICER1, EPCAM (Deletion/duplication testing only), GREM1 (promoter region deletion/duplication testing only), KIT, MEN1, MLH1, MSH2, MSH3, MSH6, MUTYH, NBN, NF1, NHTL1, PALB2, PDGFRA, PMS2, POLD1, POLE, PTEN, RAD50, RAD51C, RAD51D, SDHB, SDHC, SDHD, SMAD4, SMARCA4. STK11, TP53, TSC1, TSC2, and VHL.  The following genes were evaluated for sequence changes only: SDHA and HOXB13 c.251G>A variant only.  (2) right lumpectomy 01/13/2019 showed ductal carcinoma in situ, grade 3, measuring 4 cm, with negative resection margins.  (3) adjuvant radiation 02/23/2019 - 04/14/2019  (a) right breast / 50 Gy in 25 fractions  (b) seroma boost / 14.4 Gy in 8 fractions  (4) started tamoxifen 05/07/2019  (5) She recent had left breast needle biopsy.  Pathology showed a grade 1 invasive ductal carcinoma, ER/PR positive HER2 2+ by IHC FISH negative. Surgery confirmed a 4 mm tumor, Oncotype not done given the small size of the tumor.  She is now post adjuvant radiation.  She is here to initiate antiestrogen therapy.  Since she was on tamoxifen at the time of her diagnosis or recurrence we will start her on aromatase inhibitors along with ovarian suppression.  I once again discussed  today about the mechanism of action of aromatase  inhibitors, adverse effects including but not limited to postmenopausal symptoms such as vaginal dryness, hot flashes, mood swings, bone density loss.  We have discussed about weightbearing exercises to reduce incidence of osteopenia.  She will have a baseline bone density scan.  She can start the anastrozole in the next couple weeks to allow some healing in between treatments.  She will return to clinic in 3 months or sooner as needed  Total encounter time: 30 min  *Total Encounter Time as defined by the Centers for Medicare and Medicaid Services includes, in addition to the face-to-face time of a patient visit (documented in the note above) non-face-to-face time: obtaining and reviewing outside history, ordering and reviewing medications, tests or procedures, care coordination (communications with other health care professionals or caregivers) and documentation in the medical record.

## 2023-02-08 ENCOUNTER — Telehealth: Payer: Self-pay | Admitting: Adult Health

## 2023-02-08 ENCOUNTER — Encounter: Payer: Self-pay | Admitting: Hematology and Oncology

## 2023-02-08 ENCOUNTER — Other Ambulatory Visit: Payer: Self-pay | Admitting: *Deleted

## 2023-02-08 NOTE — Telephone Encounter (Signed)
Scheduled appointments per scheduling message. Patient is aware of the made appointments.  

## 2023-02-10 ENCOUNTER — Other Ambulatory Visit: Payer: Self-pay | Admitting: Family Medicine

## 2023-02-10 DIAGNOSIS — I10 Essential (primary) hypertension: Secondary | ICD-10-CM

## 2023-02-11 ENCOUNTER — Encounter: Payer: Self-pay | Admitting: Hematology and Oncology

## 2023-02-18 ENCOUNTER — Other Ambulatory Visit: Payer: Self-pay

## 2023-02-18 ENCOUNTER — Inpatient Hospital Stay: Payer: 59 | Attending: Hematology and Oncology

## 2023-02-18 VITALS — BP 127/97 | HR 110 | Temp 99.3°F | Resp 18

## 2023-02-18 DIAGNOSIS — C50312 Malignant neoplasm of lower-inner quadrant of left female breast: Secondary | ICD-10-CM | POA: Diagnosis not present

## 2023-02-18 DIAGNOSIS — C50912 Malignant neoplasm of unspecified site of left female breast: Secondary | ICD-10-CM

## 2023-02-18 DIAGNOSIS — Z5111 Encounter for antineoplastic chemotherapy: Secondary | ICD-10-CM | POA: Insufficient documentation

## 2023-02-18 MED ORDER — GOSERELIN ACETATE 3.6 MG ~~LOC~~ IMPL
3.6000 mg | DRUG_IMPLANT | Freq: Once | SUBCUTANEOUS | Status: AC
  Administered 2023-02-18: 3.6 mg via SUBCUTANEOUS
  Filled 2023-02-18: qty 3.6

## 2023-02-19 ENCOUNTER — Encounter: Payer: Self-pay | Admitting: Hematology and Oncology

## 2023-02-19 ENCOUNTER — Ambulatory Visit (INDEPENDENT_AMBULATORY_CARE_PROVIDER_SITE_OTHER): Payer: 59 | Admitting: Family Medicine

## 2023-02-19 ENCOUNTER — Encounter: Payer: Self-pay | Admitting: Family Medicine

## 2023-02-19 VITALS — BP 128/90 | HR 114 | Temp 99.6°F | Resp 18 | Ht 63.0 in | Wt 139.8 lb

## 2023-02-19 DIAGNOSIS — B359 Dermatophytosis, unspecified: Secondary | ICD-10-CM

## 2023-02-19 DIAGNOSIS — R251 Tremor, unspecified: Secondary | ICD-10-CM

## 2023-02-19 DIAGNOSIS — I10 Essential (primary) hypertension: Secondary | ICD-10-CM | POA: Diagnosis not present

## 2023-02-19 DIAGNOSIS — C50912 Malignant neoplasm of unspecified site of left female breast: Secondary | ICD-10-CM | POA: Diagnosis not present

## 2023-02-19 MED ORDER — METOPROLOL SUCCINATE ER 50 MG PO TB24: 50.0000 mg | ORAL_TABLET | Freq: Every day | ORAL | 3 refills | Status: DC

## 2023-02-19 MED ORDER — LOSARTAN POTASSIUM 25 MG PO TABS: 25.0000 mg | ORAL_TABLET | Freq: Every day | ORAL | 1 refills | Status: DC

## 2023-02-19 MED ORDER — NYSTATIN 100000 UNIT/GM EX POWD: 1.0000 | Freq: Three times a day (TID) | CUTANEOUS | 0 refills | Status: DC

## 2023-02-19 MED ORDER — ANASTROZOLE 1 MG PO TABS: 1.0000 mg | ORAL_TABLET | Freq: Every day | ORAL | 3 refills | Status: DC

## 2023-02-19 NOTE — Patient Instructions (Signed)
 Tremor A tremor is trembling or shaking that you cannot control. Most tremors affect the hands or arms. Tremors can also affect the head, vocal cords, face, and other parts of the body. There are many types of tremors. Common types include: Essential tremor. These usually occur in people older than 40. This type of tremor may run in families and can happen in otherwise healthy people. Resting tremor. These occur when the muscles are at rest, such as when your hands are resting in your lap. People with Parkinson's disease often have resting tremors. Postural tremor. These occur when you try to hold a pose, such as keeping your hands outstretched. Kinetic tremor. These occur during purposeful movement, such as trying to touch a finger to your nose. Task-specific tremor. These may occur when you do certain tasks such as writing, speaking, or standing. Psychogenic tremor. These are greatly reduced or go away when you are distracted. These tremors happen due to underlying stress or psychiatric disease. They can happen in people of all ages. Some types of tremors have no known cause. Tremors can also be a symptom of nervous system problems (neurological disorders) that may occur with aging. Some tremors go away with treatment, while others do not. Follow these instructions at home: Lifestyle     If you drink alcohol: Limit how much you have to: 0-1 drink a day for women who are not pregnant. 0-2 drinks a day for men. Know how much alcohol is in a drink. In the [place] one drink equals one 12 oz bottle of beer (355 mL), one 5 oz glass of wine (148 mL), or one 1 oz glass of hard liquor (44 mL). Do not use any products that contain nicotine or tobacco. These products include cigarettes, chewing tobacco, and vaping devices, such as e-cigarettes. If you need help quitting, ask your health care provider. Avoid extreme heat and extreme cold. Limit your caffeine intake, as told by your health care  provider. Try to get 8 hours of sleep each night. Find ways to manage your stress, such as meditation or yoga. General instructions Take over-the-counter and prescription medicines only as told by your health care provider. Keep all follow-up visits. This is important. Contact a health care provider if: You develop a tremor after starting a new medicine. You have a tremor along with other symptoms such as: Numbness. Tingling. Pain. Weakness. Your tremor gets worse. Your tremor interferes with your day-to-day life. Summary A tremor is trembling or shaking that you cannot control. Most tremors affect the hands or arms. Some types of tremors have no known cause. Others may be a symptom of nervous system problems (neurological disorders). Make sure you discuss any tremors you have with your health care provider. This information is not intended to replace advice given to you by your health care provider. Make sure you discuss any questions you have with your health care provider. Document Revised: 05/12/2021 Document Reviewed: 05/12/2021 Elsevier Patient Education  2024 [employer].

## 2023-02-19 NOTE — Progress Notes (Signed)
Established Patient Office Visit  Subjective   Patient ID: [name], female    DOB: 1975-10-19  Age: 47 y.o. MRN: [id number]  Chief Complaint  Patient presents with   Hypertension   Follow-up    HPI Discussed the use of AI scribe software for clinical note transcription with the patient, who gave verbal consent to proceed.  History of Present Illness   The patient, with a history of cancer, presents with tremors and a high pulse rate. She is currently receiving monthly injections for cancer treatment, which she expects to continue for years. She also reports puffiness, which she attributes to medication. She has been experiencing hot flashes and breast pain, which she manages with Tylenol as needed. She also reports a rash under her breast, which she has been treating with over-the-counter remedies. She has been prescribed an estrogen pill and a blood pressure medication, losartan, but she has not yet started taking the estrogen pill. She has noticed blurry vision since starting the blood pressure medication.      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   Dry eye 09/02/2017   Rectal bleed 05/20/2017   Elevated BP without diagnosis of hypertension 05/20/2017   Other dysphagia 02/15/2016   HNP (herniated nucleus pulposus), lumbar 12/15/2015   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   URI (upper respiratory infection) 10/17/2011   Displacement of intervertebral disc, site unspecified, without myelopathy 09/04/2011   Herpes genitalis in women 03/05/2011    Constipation 01/16/2011   WRIST PAIN, LEFT 10/17/2010   CERVICAL LYMPHADENOPATHY 08/15/2010   HEMATURIA UNSPECIFIED 06/01/2009   LOW BACK PAIN, CHRONIC 06/01/2009   MOLE 11/19/2008   CELLULITIS/ABSCESS NOS 09/30/2008   GENITAL HERPES, HX OF 11/18/2007   DEGENERATIVE DISC DISEASE 10/08/2007   GASTRITIS 06/25/2007   ABSENCE OF MENSTRUATION 06/25/2007   BACK PAIN 06/25/2007   GERD 09/18/2006   Past Medical History:  Diagnosis Date   Anemia    Back pain    Bimalleolar ankle fracture    left   Breast cancer (HCC)    right breast- 2020   Breast cancer (HCC) 09/2022   left breast   Breast cancer (HCC) 10/2022   Left   Family history of breast cancer    GERD (gastroesophageal reflux disease)    Hypertension    no meds   Kidney infection    1998- hosp.[facility]   Personal history of radiation therapy    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, more than 10 years ago- no instances since   Past Surgical History:  Procedure Laterality Date   ANTERIOR LUMBAR FUSION  11/14/2011   Procedure: ANTERIOR LUMBAR FUSION 2 LEVELS;  Surgeon: Javier Docker, MD;  Location: [facility] OR;  Service: Orthopedics;  Laterality: N/A;  ALIF L5-S1   BACK SURGERY     BREAST BIOPSY  11/13/2022   MM LT RADIOACTIVE SEED LOC MAMMO GUIDE 11/13/2022 [facility] MAMMOGRAPHY   BREAST LUMPECTOMY Right 01/2019   BREAST LUMPECTOMY WITH RADIOACTIVE SEED AND SENTINEL LYMPH NODE BIOPSY Left 11/15/2022   Procedure: LEFT BREAST LUMPECTOMY WITH  RADIOACTIVE SEED AND SENTINEL LYMPH NODE BIOPSY;  Surgeon: Harriette Bouillon, MD;  Location: [facility] OR;  Service: General;  Laterality: Left;   BREAST LUMPECTOMY WITH RADIOACTIVE SEED LOCALIZATION Right 01/13/2019   Procedure: RIGHT BREAST LUMPECTOMY WITH RADIOACTIVE SEED LOCALIZATION X3;  Surgeon: Harriette Bouillon, MD;  Location: [facility];  Service: General;  Laterality: Right;   CESAREAN SECTION     2007- /w epidural  anesthesia    COLONOSCOPY     IRRIGATION AND DEBRIDEMENT ABSCESS Left  11/19/2022   Procedure: IRRIGATION AND DEBRIDEMENT LEFT AXILLA;  Surgeon: Fritzi Mandes, MD;  Location: [facility] OR;  Service: General;  Laterality: Left;   LUMBAR LAMINECTOMY/DECOMPRESSION MICRODISCECTOMY Right 12/15/2015   Procedure:  MICO LUMBER DECOMPRESSION L4-5 ON THE RIGHT, ;  Surgeon: Jene Every, MD;  Location: [facility];  Service: Orthopedics;  Laterality: Right;   ORIF ANKLE FRACTURE Left 10/03/2017   Procedure: OPEN REDUCTION INTERNAL FIXATION (ORIF) ANKLE BIMALLEOLAR FRACTURE; possible deltoid ligament repair;  Surgeon: Toni Arthurs, MD;  Location: [facility];  Service: Orthopedics;  Laterality: Left;   SPINE SURGERY     fusion   UPPER GASTROINTESTINAL ENDOSCOPY     WISDOM TOOTH EXTRACTION     young adult   Social History   Tobacco Use   Smoking status: Former    Current packs/day: 0.00    Average packs/day: 0.3 packs/day for 15.0 years (3.8 ttl pk-yrs)    Types: Cigarettes    Start date: 04/01/2002    Quit date: 04/01/2017    Years since quitting: 5.8   Smokeless tobacco: Never   Tobacco comments:    vapes daily  Vaping Use   Vaping status: Every Day   Substances: Nicotine  Substance Use Topics   Alcohol use: Yes    Alcohol/week: 1.0 standard drink of alcohol    Types: 1 Glasses of wine per week    Comment: maybe 1 glass of wine per week, if that   Drug use: No   Social History   Socioeconomic History   Marital status: Married    Spouse name: Not on file   Number of children: 1   Years of education: Not on file   Highest education level: Not on file  Occupational History    Comment: working full time  Tobacco Use   Smoking status: Former    Current packs/day: 0.00    Average packs/day: 0.3 packs/day for 15.0 years (3.8 ttl pk-yrs)    Types: Cigarettes    Start date: 04/01/2002    Quit date: 04/01/2017    Years since quitting: 5.8   Smokeless tobacco: Never   Tobacco comments:    vapes daily  Vaping Use   Vaping status: Every Day   Substances:  Nicotine  Substance and Sexual Activity   Alcohol use: Yes    Alcohol/week: 1.0 standard drink of alcohol    Types: 1 Glasses of wine per week    Comment: maybe 1 glass of wine per week, if that   Drug use: No   Sexual activity: Not on file  Other Topics Concern   Not on file  Social History Narrative   Not on file   Social Determinants of Health   Financial Resource Strain: Not on file  Food Insecurity: Not on file  Transportation Needs: Not on file  Physical Activity: Not on file  Stress: Not on file  Social Connections: Not on file  Intimate Partner Violence: Not on file   Family Status  Relation Name Status   Mother  Alive   Father  Alive   Mat Aunt  (Not Specified)   Neg Hx  (Not Specified)  No partnership data on file   Family History  Problem Relation Age of Onset   Lupus Mother    Breast cancer Maternal Aunt    Anesthesia problems Neg Hx    Colon cancer Neg Hx    Colon polyps Neg Hx    Esophageal cancer Neg Hx    Rectal cancer Neg Hx    Stomach cancer Neg Hx    Allergies  Allergen Reactions   Tamiflu [Oseltamivir Phosphate] Other (See Comments)    Seizure   Cipro [Ciprofloxacin Hcl] Diarrhea and Nausea And Vomiting   Reglan [Metoclopramide] Other (See Comments)    "Lock jaw"   Sulfa Antibiotics Nausea And Vomiting      Review of Systems  Constitutional:  Negative for fever and malaise/fatigue.  HENT:  Negative for congestion.   Eyes:  Negative for blurred vision.  Respiratory:  Negative for shortness of breath.   Cardiovascular:  Negative for chest pain, palpitations and leg swelling.  Gastrointestinal:  Negative for abdominal pain, blood in stool and nausea.  Genitourinary:  Negative for dysuria and frequency.  Musculoskeletal:  Negative for falls.  Skin:  Negative for rash.  Neurological:  Negative for dizziness, loss of consciousness and headaches.  Endo/Heme/Allergies:  Negative for environmental allergies.  Psychiatric/Behavioral:   Negative for depression. The patient is not nervous/anxious.       Objective:     BP (!) 128/90 (BP Location: Right Arm, Patient Position: Sitting, Cuff Size: Normal)   Pulse (!) 114   Temp 99.6 F (37.6 C) (Oral)   Resp 18   Ht 5\' 3"  (1.6 m)   Wt 139 lb 12.8 oz (63.4 kg)   SpO2 98%   BMI 24.76 kg/m  BP Readings from Last 3 Encounters:  02/19/23 (!) 128/90  02/18/23 (!) 127/97  01/24/23 (!) 141/109   Wt Readings from Last 3 Encounters:  02/19/23 139 lb 12.8 oz (63.4 kg)  01/24/23 138 lb (62.6 kg)  12/11/22 136 lb 8 oz (61.9 kg)   SpO2 Readings from Last 3 Encounters:  02/19/23 98%  02/18/23 99%  01/24/23 98%      Physical Exam Vitals and nursing note reviewed.  Constitutional:      General: She is not in acute distress.    Appearance: Normal appearance. She is well-developed.  HENT:     Head: Normocephalic and atraumatic.  Eyes:     General: No scleral icterus.       Right eye: No discharge.        Left eye: No discharge.  Cardiovascular:     Rate and Rhythm: Normal rate and regular rhythm.     Heart sounds: No murmur heard. Pulmonary:     Effort: Pulmonary effort is normal. No respiratory distress.     Breath sounds: Normal breath sounds.  Musculoskeletal:        General: Normal range of motion.     Cervical back: Normal range of motion and neck supple.     Right lower leg: No edema.     Left lower leg: No edema.  Skin:    General: Skin is warm and dry.     Findings: Erythema present.          Comments: Hyperpigmentation under L breast----  c/w tinea   Neurological:     Mental Status: She is  alert and oriented to person, place, and time.  Psychiatric:        Mood and Affect: Mood normal.        Behavior: Behavior normal.        Thought Content: Thought content normal.        Judgment: Judgment normal.      No results found for any visits on 02/19/23.  Last CBC Lab Results  Component Value Date   WBC 8.3 11/20/2022   HGB 10.5 (L)  11/20/2022   HCT 29.6 (L) 11/20/2022   MCV 117.9 (H) 11/20/2022   MCH 41.8 (H) 11/20/2022   RDW 12.5 11/20/2022   PLT 161 11/20/2022   Last metabolic panel Lab Results  Component Value Date   GLUCOSE 197 (H) 11/19/2022   NA 135 11/19/2022   K 3.2 (L) 11/19/2022   CL 99 11/19/2022   CO2 23 11/19/2022   BUN 7 11/19/2022   CREATININE 0.63 11/19/2022   GFRNONAA >60 11/19/2022   CALCIUM 9.0 11/19/2022   PROT 7.8 11/19/2022   ALBUMIN 4.2 11/19/2022   BILITOT 1.7 (H) 11/19/2022   ALKPHOS 52 11/19/2022   AST 178 (H) 11/19/2022   ALT 51 (H) 11/19/2022   ANIONGAP 13 11/19/2022   Last lipids Lab Results  Component Value Date   CHOL 202 (H) 04/05/2020   HDL 53 04/05/2020   LDLCALC 109 (H) 04/05/2020   TRIG 199 (H) 04/05/2020   CHOLHDL 3.8 04/05/2020   Last hemoglobin A1c Lab Results  Component Value Date   HGBA1C 5.8 09/02/2017   Last thyroid functions No results found for: "TSH", "T3TOTAL", "T4TOTAL", "THYROIDAB" Last vitamin D No results found for: "25OHVITD2", "25OHVITD3", "VD25OH" Last vitamin B12 and Folate No results found for: "VITAMINB12", "FOLATE"    The 10-year ASCVD risk score (Arnett DK, et al., 2019) is: 4.2%    Assessment & Plan:   Problem List Items Addressed This Visit       Unprioritized   Essential hypertension   Relevant Medications   metoprolol succinate (TOPROL-XL) 50 MG 24 hr tablet   Other Relevant Orders   CBC with Differential/Platelet   Comprehensive metabolic panel   Lipid panel   Invasive ductal carcinoma of breast, female, left (HCC)   Relevant Medications   anastrozole (ARIMIDEX) 1 MG tablet   Tremor - Primary   Relevant Orders   CBC with Differential/Platelet   Comprehensive metabolic panel   Lipid panel   TSH   Vitamin B12   VITAMIN D 25 Hydroxy (Vit-D Deficiency, Fractures)   Other Visit Diagnoses     Tinea       Relevant Medications   nystatin (MYCOSTATIN/NYSTOP) powder     Assessment and Plan    Breast  Cancer: Receiving monthly Zoladex injections for an unspecified duration. Noted side effects include hot flashes and breast pain. -Continue Zoladex injections as prescribed by oncologist.  Hypertension: Elevated diastolic blood pressure and tachycardia noted on current regimen of Losartan 25mg . Patient reports intermittent dizziness and tremors. -Change antihypertensive medication to Toprolol to manage blood pressure and heart rate. -Monitor blood pressure and heart rate at home and report any abnormalities.  Estrogen Deprivation: Patient is due to start Anastrozole (Remedex) for estrogen deprivation therapy post-radiation. -Pick up Anastrozole prescription from [facility] and start as directed by oncologist.  Fungal Rash: Patient reports a resolved rash under the breast that was self-treated with over-the-counter antifungal powder. -Prescribe antifungal powder for any recurrence. -Advise to add over-the-counter cortisone cream if needed.  Follow-up in 6 months or sooner if any issues arise.        No follow-ups on file.    Donato Schultz, DO

## 2023-02-20 ENCOUNTER — Other Ambulatory Visit: Payer: Self-pay | Admitting: Family Medicine

## 2023-02-20 DIAGNOSIS — R748 Abnormal levels of other serum enzymes: Secondary | ICD-10-CM

## 2023-02-20 LAB — COMPREHENSIVE METABOLIC PANEL
ALT: 50 U/L — ABNORMAL HIGH (ref 0–35)
AST: 156 U/L — ABNORMAL HIGH (ref 0–37)
Albumin: 4.8 g/dL (ref 3.5–5.2)
Alkaline Phosphatase: 48 U/L (ref 39–117)
BUN: 11 mg/dL (ref 6–23)
CO2: 27 mEq/L (ref 19–32)
Calcium: 9.5 mg/dL (ref 8.4–10.5)
Chloride: 96 mEq/L (ref 96–112)
Creatinine, Ser: 0.53 mg/dL (ref 0.40–1.20)
GFR: 110.61 mL/min (ref >60.00)
Glucose, Bld: 125 mg/dL — ABNORMAL HIGH (ref 70–99)
Potassium: 3.7 mEq/L (ref 3.5–5.1)
Sodium: 137 mEq/L (ref 135–145)
Total Bilirubin: 0.6 mg/dL (ref 0.2–1.2)
Total Protein: 8.2 g/dL (ref 6.0–8.3)

## 2023-02-20 LAB — TSH: TSH: 1.09 u[IU]/mL (ref 0.35–5.50)

## 2023-02-20 LAB — LIPID PANEL
Cholesterol: 262 mg/dL — ABNORMAL HIGH (ref 0–200)
HDL: 116.3 mg/dL (ref >39.00)
LDL Cholesterol: 129 mg/dL — ABNORMAL HIGH (ref 0–99)
NonHDL: 145.94
Total CHOL/HDL Ratio: 2
Triglycerides: 85 mg/dL (ref 0.0–149.0)
VLDL: 17 mg/dL (ref 0.0–40.0)

## 2023-02-20 LAB — CBC WITH DIFFERENTIAL/PLATELET
Basophils Absolute: 0 10*3/uL (ref 0.0–0.1)
Basophils Relative: 0.3 % (ref 0.0–3.0)
Eosinophils Absolute: 0 10*3/uL (ref 0.0–0.7)
Eosinophils Relative: 0.6 % (ref 0.0–5.0)
HCT: 37.9 % (ref 36.0–46.0)
Hemoglobin: 12.5 g/dL (ref 12.0–15.0)
Lymphocytes Relative: 17.1 % (ref 12.0–46.0)
Lymphs Abs: 0.7 10*3/uL (ref 0.7–4.0)
MCHC: 32.9 g/dL (ref 30.0–36.0)
MCV: 99.6 fl (ref 78.0–100.0)
Monocytes Absolute: 0.5 10*3/uL (ref 0.1–1.0)
Monocytes Relative: 12.9 % — ABNORMAL HIGH (ref 3.0–12.0)
Neutro Abs: 2.7 10*3/uL (ref 1.4–7.7)
Neutrophils Relative %: 69.1 % (ref 43.0–77.0)
Platelets: 148 10*3/uL — ABNORMAL LOW (ref 150.0–400.0)
RBC: 3.81 Mil/uL — ABNORMAL LOW (ref 3.87–5.11)
RDW: 14.7 % (ref 11.5–15.5)
WBC: 3.9 10*3/uL — ABNORMAL LOW (ref 4.0–10.5)

## 2023-02-20 LAB — VITAMIN B12: Vitamin B-12: 235 pg/mL (ref 211–911)

## 2023-02-20 LAB — VITAMIN D 25 HYDROXY (VIT D DEFICIENCY, FRACTURES): VITD: 17.82 ng/mL — ABNORMAL LOW (ref 30.00–100.00)

## 2023-02-22 ENCOUNTER — Telehealth (HOSPITAL_BASED_OUTPATIENT_CLINIC_OR_DEPARTMENT_OTHER): Payer: Self-pay

## 2023-02-22 ENCOUNTER — Other Ambulatory Visit: Payer: Self-pay

## 2023-02-22 MED ORDER — VITAMIN D (ERGOCALCIFEROL) 1.25 MG (50000 UNIT) PO CAPS: 50000.0000 [IU] | ORAL_CAPSULE | ORAL | 1 refills | Status: DC

## 2023-03-01 ENCOUNTER — Other Ambulatory Visit: Payer: Self-pay

## 2023-03-01 ENCOUNTER — Telehealth: Payer: Self-pay

## 2023-03-01 NOTE — Telephone Encounter (Signed)
Patient informed [employer] approved 10/05/2022- 10/04/2023, MID# [id number]. Patient stated she did received her card, will review package.

## 2023-03-11 ENCOUNTER — Ambulatory Visit: Payer: 59 | Attending: Hematology and Oncology

## 2023-03-11 VITALS — Wt 143.0 lb

## 2023-03-11 DIAGNOSIS — Z483 Aftercare following surgery for neoplasm: Secondary | ICD-10-CM | POA: Insufficient documentation

## 2023-03-11 NOTE — Therapy (Signed)
OUTPATIENT PHYSICAL THERAPY SOZO SCREENING NOTE   Patient Name: [name] MRN: [id number] DOB:[birth year], 47 y.o., female Today's Date: 03/11/2023  PCP: Donato Schultz, DO REFERRING PROVIDER: Rachel Moulds, MD   PT End of Session - 03/11/23 1701     Visit Number 2   # unchanged due to screen only   PT Start Time 1659    PT Stop Time 1703    PT Time Calculation (min) 4 min    Activity Tolerance Patient tolerated treatment well    Behavior During Therapy [facility] for tasks assessed/performed             Past Medical History:  Diagnosis Date   Anemia    Back pain    Bimalleolar ankle fracture    left   Breast cancer (HCC)    right breast- 2020   Breast cancer (HCC) 09/2022   left breast   Breast cancer (HCC) 10/2022   Left   Family history of breast cancer    GERD (gastroesophageal reflux disease)    Hypertension    no meds   Kidney infection    1998- hosp.[facility]   Personal history of radiation therapy    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, more than 10 years ago- no instances since   Past Surgical History:  Procedure Laterality Date   ANTERIOR LUMBAR FUSION  11/14/2011   Procedure: ANTERIOR LUMBAR FUSION 2 LEVELS;  Surgeon: Javier Docker, MD;  Location: [facility] OR;  Service: Orthopedics;  Laterality: N/A;  ALIF L5-S1   BACK SURGERY     BREAST BIOPSY  11/13/2022   MM LT RADIOACTIVE SEED LOC MAMMO GUIDE 11/13/2022 [facility] MAMMOGRAPHY   BREAST LUMPECTOMY Right 01/2019   BREAST LUMPECTOMY WITH RADIOACTIVE SEED AND SENTINEL LYMPH NODE BIOPSY Left 11/15/2022   Procedure: LEFT BREAST LUMPECTOMY WITH RADIOACTIVE SEED AND SENTINEL LYMPH NODE BIOPSY;  Surgeon: Harriette Bouillon, MD;  Location: [facility];  Service: General;  Laterality: Left;   BREAST LUMPECTOMY WITH RADIOACTIVE SEED LOCALIZATION Right 01/13/2019   Procedure: RIGHT BREAST LUMPECTOMY WITH RADIOACTIVE SEED LOCALIZATION X3;  Surgeon: Harriette Bouillon, MD;  Location: [facility];  Service: General;   Laterality: Right;   CESAREAN SECTION     2007- /w epidural  anesthesia    COLONOSCOPY     IRRIGATION AND DEBRIDEMENT ABSCESS Left 11/19/2022   Procedure: IRRIGATION AND DEBRIDEMENT LEFT AXILLA;  Surgeon: Fritzi Mandes, MD;  Location: [facility] OR;  Service: General;  Laterality: Left;   LUMBAR LAMINECTOMY/DECOMPRESSION MICRODISCECTOMY Right 12/15/2015   Procedure:  MICO LUMBER DECOMPRESSION L4-5 ON THE RIGHT, ;  Surgeon: Jene Every, MD;  Location: [facility];  Service: Orthopedics;  Laterality: Right;   ORIF ANKLE FRACTURE Left 10/03/2017   Procedure: OPEN REDUCTION INTERNAL FIXATION (ORIF) ANKLE BIMALLEOLAR FRACTURE; possible deltoid ligament repair;  Surgeon: Toni Arthurs, MD;  Location: [facility];  Service: Orthopedics;  Laterality: Left;   SPINE SURGERY     fusion   UPPER GASTROINTESTINAL ENDOSCOPY     WISDOM TOOTH EXTRACTION     young adult   Patient Active Problem List   Diagnosis Date Noted   Tremor 02/19/2023   Hematoma of left axilla 11/19/2022   S/P debridement 11/19/2022   Invasive ductal carcinoma of breast, female, left (HCC) 10/15/2022   Fever, unspecified 03/23/2019   Essential hypertension 03/13/2019   Elevated liver enzymes 11/06/2018   Genetic testing 10/06/2018   Family history of breast cancer    Ductal carcinoma  in situ (DCIS) of right breast 09/19/2018   Dry eye 09/02/2017   Rectal bleed 05/20/2017   Elevated BP without diagnosis of hypertension 05/20/2017   Other dysphagia 02/15/2016   HNP (herniated nucleus pulposus), lumbar 12/15/2015   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   URI (upper respiratory infection) 10/17/2011   Displacement of intervertebral disc, site unspecified, without myelopathy 09/04/2011   Herpes genitalis in women 03/05/2011   Constipation 01/16/2011   WRIST PAIN, LEFT 10/17/2010   CERVICAL LYMPHADENOPATHY 08/15/2010   HEMATURIA UNSPECIFIED 06/01/2009   LOW BACK PAIN, CHRONIC 06/01/2009   MOLE  11/19/2008   CELLULITIS/ABSCESS NOS 09/30/2008   GENITAL HERPES, HX OF 11/18/2007   DEGENERATIVE DISC DISEASE 10/08/2007   GASTRITIS 06/25/2007   ABSENCE OF MENSTRUATION 06/25/2007   BACK PAIN 06/25/2007   GERD 09/18/2006    REFERRING DIAG: right breast cancer at risk for lymphedema  THERAPY DIAG:  Aftercare following surgery for neoplasm  PERTINENT HISTORY: Hx of Rt lumpectomy in 2020 for DCIS grade 3, radiation to the Rt breast. No nodes removed.  Recent Lt IDC ER/PR positive size of 5mm.  Lt lumpectomy and SLNB on 11/15/22 with 4 negative nodes removed and then irrigation and debridement of the axilla with drain placement on 11/19/22. Hx of lumbar fusion and disc removal.   PRECAUTIONS: right UE Lymphedema risk, None  SUBJECTIVE: Pt returns for her first 3 month L-Dex screen.   PAIN:  Are you having pain? No  SOZO SCREENING: Patient was assessed today using the SOZO machine to determine the lymphedema index score. This was compared to her baseline score. It was determined that she is within the recommended range when compared to her baseline and no further action is needed at this time. She will continue SOZO screenings. These are done every 3 months for 2 years post operatively followed by every 6 months for 2 years, and then annually.   L-DEX FLOWSHEETS - 03/11/23 1700       L-DEX LYMPHEDEMA SCREENING   Measurement Type Unilateral    L-DEX MEASUREMENT EXTREMITY Upper Extremity    POSITION  Standing    DOMINANT SIDE Right    At Risk Side Left    BASELINE SCORE (UNILATERAL) 3.8    L-DEX SCORE (UNILATERAL) -0.2    VALUE CHANGE (UNILAT) -4               Hermenia Bers, PTA 03/11/2023, 5:03 PM

## 2023-03-18 ENCOUNTER — Inpatient Hospital Stay: Payer: Medicaid Other | Attending: Hematology and Oncology

## 2023-03-18 DIAGNOSIS — Z5111 Encounter for antineoplastic chemotherapy: Secondary | ICD-10-CM | POA: Insufficient documentation

## 2023-03-18 DIAGNOSIS — C50312 Malignant neoplasm of lower-inner quadrant of left female breast: Secondary | ICD-10-CM | POA: Insufficient documentation

## 2023-03-21 ENCOUNTER — Other Ambulatory Visit: Payer: Self-pay

## 2023-03-21 ENCOUNTER — Inpatient Hospital Stay: Payer: Medicaid Other

## 2023-03-21 VITALS — BP 142/97 | HR 108 | Temp 99.6°F | Resp 18

## 2023-03-21 DIAGNOSIS — Z5111 Encounter for antineoplastic chemotherapy: Secondary | ICD-10-CM | POA: Diagnosis not present

## 2023-03-21 DIAGNOSIS — C50312 Malignant neoplasm of lower-inner quadrant of left female breast: Secondary | ICD-10-CM | POA: Diagnosis present

## 2023-03-21 DIAGNOSIS — C50912 Malignant neoplasm of unspecified site of left female breast: Secondary | ICD-10-CM

## 2023-03-21 MED ORDER — GOSERELIN ACETATE 3.6 MG ~~LOC~~ IMPL
3.6000 mg | DRUG_IMPLANT | Freq: Once | SUBCUTANEOUS | Status: AC
  Administered 2023-03-21: 3.6 mg via SUBCUTANEOUS
  Filled 2023-03-21: qty 3.6

## 2023-04-01 ENCOUNTER — Ambulatory Visit
Admission: RE | Admit: 2023-04-01 | Discharge: 2023-04-01 | Disposition: A | Discharge status: Home / Self Care | Payer: 59 | Source: Ambulatory Visit | Attending: Hematology and Oncology | Admitting: Hematology and Oncology

## 2023-04-01 ENCOUNTER — Ambulatory Visit: Admission: RE | Admit: 2023-04-01 | Payer: 59 | Source: Ambulatory Visit

## 2023-04-01 NOTE — Progress Notes (Signed)
  Radiation Oncology         [phone]) [phone] ________________________________  Name: [name] MRN: [id number]  Date of Service: 04/01/2023  DOB: April 18, 1976  Post Treatment Telephone Note  Diagnosis:  Stage IA, [id number], grade 1, ER/PR positive invasive ductal carcinoma of the left breast. (as documented in provider EOT note)   The patient was available for call today.   Symptoms of fatigue have improved since completing therapy.  Symptoms of skin changes have improved since completing therapy.  The patient was encouraged to avoid sun exposure in the area of prior treatment for up to one year following radiation with either sunscreen or by the style of clothing worn in the sun.  The patient has scheduled follow up with her medical oncologist Dr. Al Pimple for ongoing surveillance, and was encouraged to call if she develops concerns or questions regarding radiation.   This concludes the interaction.  Ruel Favors, LPN

## 2023-04-15 ENCOUNTER — Inpatient Hospital Stay: Payer: Medicaid Other | Attending: Hematology and Oncology

## 2023-04-15 DIAGNOSIS — Z5111 Encounter for antineoplastic chemotherapy: Secondary | ICD-10-CM | POA: Insufficient documentation

## 2023-04-15 DIAGNOSIS — C50312 Malignant neoplasm of lower-inner quadrant of left female breast: Secondary | ICD-10-CM | POA: Insufficient documentation

## 2023-04-25 ENCOUNTER — Telehealth: Payer: Self-pay

## 2023-04-25 ENCOUNTER — Telehealth: Payer: Self-pay | Admitting: Hematology and Oncology

## 2023-04-25 NOTE — Telephone Encounter (Signed)
Pt LVM to reschedule injection appointment. This RN called back and LVM stating that a message has been sent to scheduling to reschedule as soon as possible and to expect a phone call from them.

## 2023-04-26 ENCOUNTER — Inpatient Hospital Stay: Payer: Medicaid Other

## 2023-04-26 VITALS — BP 129/89 | HR 101 | Temp 99.4°F | Resp 18

## 2023-04-26 DIAGNOSIS — C50912 Malignant neoplasm of unspecified site of left female breast: Secondary | ICD-10-CM

## 2023-04-26 DIAGNOSIS — Z5111 Encounter for antineoplastic chemotherapy: Secondary | ICD-10-CM | POA: Diagnosis present

## 2023-04-26 DIAGNOSIS — C50312 Malignant neoplasm of lower-inner quadrant of left female breast: Secondary | ICD-10-CM | POA: Diagnosis present

## 2023-04-26 MED ORDER — GOSERELIN ACETATE 3.6 MG ~~LOC~~ IMPL
3.6000 mg | DRUG_IMPLANT | Freq: Once | SUBCUTANEOUS | Status: AC
  Administered 2023-04-26: 3.6 mg via SUBCUTANEOUS
  Filled 2023-04-26: qty 3.6

## 2023-05-21 ENCOUNTER — Inpatient Hospital Stay: Payer: Medicaid Other

## 2023-05-21 ENCOUNTER — Other Ambulatory Visit: Payer: Self-pay | Admitting: Gastroenterology

## 2023-05-21 ENCOUNTER — Encounter: Payer: 59 | Admitting: Adult Health

## 2023-05-21 DIAGNOSIS — K219 Gastro-esophageal reflux disease without esophagitis: Secondary | ICD-10-CM

## 2023-05-21 DIAGNOSIS — K22719 Barrett's esophagus with dysplasia, unspecified: Secondary | ICD-10-CM

## 2023-05-24 ENCOUNTER — Inpatient Hospital Stay: Payer: Medicaid Other

## 2023-05-24 ENCOUNTER — Inpatient Hospital Stay: Payer: Medicaid Other | Attending: Hematology and Oncology | Admitting: Adult Health

## 2023-05-24 ENCOUNTER — Encounter: Payer: Self-pay | Admitting: *Deleted

## 2023-05-24 DIAGNOSIS — C50312 Malignant neoplasm of lower-inner quadrant of left female breast: Secondary | ICD-10-CM | POA: Insufficient documentation

## 2023-05-24 DIAGNOSIS — Z5111 Encounter for antineoplastic chemotherapy: Secondary | ICD-10-CM | POA: Insufficient documentation

## 2023-05-30 ENCOUNTER — Inpatient Hospital Stay: Payer: Medicaid Other

## 2023-05-30 ENCOUNTER — Other Ambulatory Visit: Payer: Self-pay

## 2023-05-30 VITALS — BP 115/83 | HR 94 | Temp 99.2°F | Resp 18

## 2023-05-30 DIAGNOSIS — C50912 Malignant neoplasm of unspecified site of left female breast: Secondary | ICD-10-CM

## 2023-05-30 DIAGNOSIS — Z5111 Encounter for antineoplastic chemotherapy: Secondary | ICD-10-CM | POA: Diagnosis present

## 2023-05-30 DIAGNOSIS — C50312 Malignant neoplasm of lower-inner quadrant of left female breast: Secondary | ICD-10-CM | POA: Diagnosis present

## 2023-05-30 MED ORDER — GOSERELIN ACETATE 3.6 MG ~~LOC~~ IMPL
3.6000 mg | DRUG_IMPLANT | Freq: Once | SUBCUTANEOUS | Status: AC
  Administered 2023-05-30: 3.6 mg via SUBCUTANEOUS
  Filled 2023-05-30: qty 3.6

## 2023-06-06 ENCOUNTER — Inpatient Hospital Stay (HOSPITAL_BASED_OUTPATIENT_CLINIC_OR_DEPARTMENT_OTHER): Payer: Medicaid Other | Admitting: Hematology and Oncology

## 2023-06-06 VITALS — BP 123/86 | HR 90 | Temp 97.7°F | Resp 18 | Wt 146.7 lb

## 2023-06-06 DIAGNOSIS — C50912 Malignant neoplasm of unspecified site of left female breast: Secondary | ICD-10-CM

## 2023-06-06 DIAGNOSIS — Z5111 Encounter for antineoplastic chemotherapy: Secondary | ICD-10-CM | POA: Diagnosis not present

## 2023-06-06 NOTE — Progress Notes (Signed)
[facility]  [phone] [phone]    ID: [name] DOB: May 16, 1976  MR#: [id number]  JYN#:[id number]  Patient Care Team: Zola Button, Grayling Congress, DO as PCP - General Jene Every, MD (Orthopedic Surgery) Donnelly Angelica, RN as Oncology Nurse Navigator Harriette Bouillon, MD as Consulting Physician (General Surgery) Dorothy Puffer, MD as Consulting Physician (Radiation Oncology) Harold Hedge, MD as Consulting Physician (Obstetrics and Gynecology) Toni Arthurs, MD as Consulting Physician (Orthopedic Surgery) Pershing Proud, RN as Oncology Nurse Navigator Rachel Moulds, MD as Consulting Physician (Hematology and Oncology) OTHER MD:   CHIEF COMPLAINT: IDC  CURRENT TREATMENT: Zoladex and anastrozole  INTERVAL HISTORY:  Discussed the use of AI scribe software for clinical note transcription with the patient, who gave verbal consent to proceed.  History of Present Illness   The patient, with a history of breast cancer, presents for a follow-up visit after undergoing radiation therapy. She reports persistent changes in the pigmentation and texture of her breast, which she describes as "a little brown" and "tougher." She also notes that her breast is "a little tough." She is currently wearing a sports bra for compression, but is considering a medical compression bra.  In addition to the physical changes, the patient is experiencing hot flashes and mood swings, which she attributes to her current medications, anastrozole and Zoladex. She describes the hot flashes as lasting for "five minutes" and then subsiding. She also reports occasional irritability.  The patient also mentions a vitamin D deficiency, for which she has been prescribed a high-dose supplement. However, she has not been taking the supplement consistently. She reports trying to incorporate it into her weekly medication schedule, but has not yet established a routine.  The  patient's father, physician by occupation is present during the visit and participates in the conversation, asking questions about the patient's medications and treatment plan.       REVIEW OF SYSTEMS: Rest of the pertinent 10 point ROS reviewed and negative  HISTORY OF CURRENT ILLNESS: From the original intake note:  "[name]" [name] underwent bilateral diagnostic mammography with tomography at [facility] on 09/12/2018 showing: Breast Density Category C.   In the right breast, magnified views demonstrate a group of fine pleomorphic calcifications in the lower outer quadrant of the right breast spanning 2.8 x 1.7 x 5.0 cm. Calcifications are not typical for fat necrosis.   Accordingly on 09/17/2018 she proceeded to biopsy of the right breast area in question. The pathology from this procedure showed ([id number]): ductal carcinoma in situ with calcifications, high grade. Prognostic indicators significant for: estrogen receptor, 0% negative and progesterone receptor, 0% negative.   In the left breast, magnified views of the left breast calcifications demonstrate a group of faint pleomorphic calcifications in the upper-outer quadrant measuring approximately 0.8 cm in diameter. Calcifications are not typical for fat necrosis.   Accordingly, she underwent a biopsy of the right breast area in question on 09/17/2018. The pathology from this procedure showed ([id number]): fibrocystic changes with adenosis and calcifications. There is no evidence of malignancy.    The patient's subsequent history is as detailed below.   PAST MEDICAL HISTORY: Past Medical History:  Diagnosis Date   Anemia    Back pain    Bimalleolar ankle fracture    left   Breast cancer (HCC)    right breast- 2020   Breast cancer (HCC) 09/2022   left breast   Breast cancer (HCC) 10/2022   Left  Family history of breast cancer    GERD (gastroesophageal reflux disease)    Hypertension    no meds    Kidney infection    1998- hosp.[facility]   Personal history of radiation therapy    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, more than 10 years ago- no instances since    PAST SURGICAL HISTORY: Past Surgical History:  Procedure Laterality Date   ANTERIOR LUMBAR FUSION  11/14/2011   Procedure: ANTERIOR LUMBAR FUSION 2 LEVELS;  Surgeon: Javier Docker, MD;  Location: [facility] OR;  Service: Orthopedics;  Laterality: N/A;  ALIF L5-S1   BACK SURGERY     BREAST BIOPSY  11/13/2022   MM LT RADIOACTIVE SEED LOC MAMMO GUIDE 11/13/2022 [facility] MAMMO[facility]APHY   BREAST LUMPECTOMY Right 01/2019   BREAST LUMPECTOMY WITH RADIOACTIVE SEED AND SENTINEL LYMPH NODE BIOPSY Left 11/15/2022   Procedure: LEFT BREAST LUMPECTOMY WITH RADIOACTIVE SEED AND SENTINEL LYMPH NODE BIOPSY;  Surgeon: Harriette Bouillon, MD;  Location: [facility] OR;  Service: General;  Laterality: Left;   BREAST LUMPECTOMY WITH RADIOACTIVE SEED LOCALIZATION Right 01/13/2019   Procedure: RIGHT BREAST LUMPECTOMY WITH RADIOACTIVE SEED LOCALIZATION X3;  Surgeon: Harriette Bouillon, MD;  Location: [facility];  Service: General;  Laterality: Right;   CESAREAN SECTION     2007- /w epidural  anesthesia    COLONOSCOPY     IRRIGATION AND DEBRIDEMENT ABSCESS Left 11/19/2022   Procedure: IRRIGATION AND DEBRIDEMENT LEFT AXILLA;  Surgeon: Fritzi Mandes, MD;  Location[facility] OR;  Service: General;  Laterality: Left;   LUMBAR LAMINECTOMY/DECOMPRESSION MICRODISCECTOMY Right 12/15/2015   Procedure:  MICO LUMBER DECOMPRESSION L4-5 ON THE RIGHT, ;  Surgeon: Jene Every, MD;  Location: [facility];  Service: Orthopedics;  Laterality: Right;   ORIF ANKLE FRACTURE Left 10/03/2017   Procedure: OPEN REDUCTION INTERNAL FIXATION (ORIF) ANKLE BIMALLEOLAR FRACTURE; possible deltoid ligament repair;  Surgeon: Toni Arthurs, MD;  Location: [facility];  Service: Orthopedics;  Laterality: Left;   SPINE SURGERY     fusion   UPPER GASTROINTESTINAL ENDOSCOPY     WISDOM TOOTH EXTRACTION      young adult    FAMILY HISTORY: Family History  Problem Relation Age of Onset   Lupus Mother    Breast cancer Maternal Aunt    Anesthesia problems Neg Hx    Colon cancer Neg Hx    Colon polyps Neg Hx    Esophageal cancer Neg Hx    Rectal cancer Neg Hx    Stomach cancer Neg Hx   [name] are both 79 as of 09/2018. The patient has 2 sisters. [name] paternal aunt was diagnosed with breast cancer at 41. Patient denies anyone in her family having breast, ovarian, prostate, or pancreatic cancer.    GYNECOLOGIC HISTORY:  No LMP recorded. (Menstrual status: IUD). 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 Their son, Lynice Heldman, is 43 years old as of December 2022. [name], Dorene Sorrow, is a retired Stage manager.    ADVANCED DIRECTIVES: Her husband, BJ, is automatically her healthcare power of attorney.     HEALTH MAINTENANCE: Social History   Tobacco Use   Smoking status: Former    Current packs/day: 0.00  Average packs/day: 0.3 packs/day for 15.0 years (3.8 ttl pk-yrs)    Types: Cigarettes    Start date: 04/01/2002    Quit date: 04/01/2017    Years since quitting: 6.1   Smokeless tobacco: Never   Tobacco comments:    vapes daily  Vaping Use   Vaping status: Every Day   Substances: Nicotine  Substance Use Topics   Alcohol use: Yes    Alcohol/week: 1.0 standard drink of alcohol    Types: 1 Glasses of wine per week    Comment: maybe 1 glass of wine per week, if that   Drug use: No    Colonoscopy: yes, 2005  PAP: 09/12/2018  Bone density: no   Allergies  Allergen Reactions   Tamiflu [Oseltamivir Phosphate] Other (See Comments)    Seizure   Cipro [Ciprofloxacin Hcl] Diarrhea and Nausea And  Vomiting   Reglan [Metoclopramide] Other (See Comments)    "Lock jaw"   Sulfa Antibiotics Nausea And Vomiting    Current Outpatient Medications  Medication Sig Dispense Refill   acetaminophen (TYLENOL) 500 MG tablet Take 1,000 mg by mouth every 6 (six) hours as needed for moderate pain.     anastrozole (ARIMIDEX) 1 MG tablet Take 1 tablet (1 mg total) by mouth daily. 90 tablet 3   ELDERBERRY PO Take 1 tablet by mouth 2 (two) times a week.     goserelin (ZOLADEX) 3.6 MG injection Inject 3.6 mg into the skin every 28 (twenty-eight) days.     metoprolol succinate (TOPROL-XL) 50 MG 24 hr tablet Take 1 tablet (50 mg total) by mouth daily. Take with or immediately following a meal. 90 tablet 3   nystatin (MYCOSTATIN/NYSTOP) powder Apply 1 Application topically 3 (three) times daily. 15 g 0   omeprazole (PRILOSEC) 40 MG capsule TAKE 1 CAPSULE BY MOUTH TWICE A DAY 60 capsule 0   valACYclovir (VALTREX) 1000 MG tablet Take 1 tablet (1,000 mg total) by mouth 3 (three) times daily as needed. For flare ups (Patient not taking: Reported on 11/19/2022) 90 tablet 2   Vitamin D, Ergocalciferol, (DRISDOL) 1.25 MG (50000 UNIT) CAPS capsule Take 1 capsule (50,000 Units total) by mouth every 7 (seven) days. 12 capsule 1   No current facility-administered medications for this visit.    OBJECTIVE: white woman in no acute distress  Vitals:   06/06/23 0908  BP: 123/86  Pulse: 90  Resp: 18  Temp: 97.7 F (36.5 C)  SpO2: 97%       Body mass index is 25.99 kg/m.   Wt Readings from Last 3 Encounters:  06/06/23 146 lb 11.2 oz (66.5 kg)  03/11/23 143 lb (64.9 kg)  02/19/23 139 lb 12.8 oz (63.4 kg)     ECOG FS:1 - Symptomatic but completely ambulatory   NECK: No lymphadenopathy. BREAST: Radiation-induced pigmentation in right breast, increased firmness, altered texture, and surgical scarring noted on inspection and palpation. No lymphadenopathy palpated. Both breasts without any concerns for  malignancy. EXTREMITIES: No edema. SKIN: Radiation-induced pigmentation on breast.  LAB RESULTS:  CMP     Component Value Date/Time   NA 137 02/19/2023 1626   K 3.7 02/19/2023 1626   CL 96 02/19/2023 1626   CO2 27 02/19/2023 1626   GLUCOSE 125 (H) 02/19/2023 1626   BUN 11 02/19/2023 1626   CREATININE 0.53 02/19/2023 1626   CREATININE 0.78 07/27/2019 1133   CALCIUM 9.5 02/19/2023 1626   PROT 8.2 02/19/2023 1626   ALBUMIN 4.8 02/19/2023 1626   AST 156 (  H) 02/19/2023 1626   AST 89 (H) 07/27/2019 1133   ALT 50 (H) 02/19/2023 1626   ALT 56 (H) 07/27/2019 1133   ALKPHOS 48 02/19/2023 1626   BILITOT 0.6 02/19/2023 1626   BILITOT 0.6 07/27/2019 1133   GFRNONAA >60 11/19/2022 1352   GFRNONAA >60 07/27/2019 1133   GFRAA >60 04/05/2020 1011   GFRAA >60 07/27/2019 1133    No results found for: "TOTALPROTELP", "ALBUMINELP", "A1GS", "A2GS", "BETS", "BETA2SER", "GAMS", "MSPIKE", "SPEI"  No results found for: "KPAFRELGTCHN", "LAMBDASER", "KAPLAMBRATIO"  Lab Results  Component Value Date   WBC 3.9 (L) 02/19/2023   NEUTROABS 2.7 02/19/2023   HGB 12.5 02/19/2023   HCT 37.9 02/19/2023   MCV 99.6 02/19/2023   PLT 148.0 (L) 02/19/2023   No results found for: "LABCA2"  No components found for: "DDUKGU542"  No results for input(s): "INR" in the last 168 hours.  No results found for: "LABCA2"  No results found for: "HCW237"  No results found for: "CAN125"  No results found for: "CAN153"  No results found for: "CA2729"  No components found for: "HGQUANT"  No results found for: "CEA1", "CEA" / No results found for: "CEA1", "CEA"   No results found for: "AFPTUMOR"  No results found for: "CHROMOGRNA"  No results found for: "TOTALPROTELP", "ALBUMINELP", "A1GS", "A2GS", "BETS", "BETA2SER", "GAMS", "MSPIKE", "SPEI" (this displays SPEP labs)  No results found for: "KPAFRELGTCHN", "LAMBDASER", "KAPLAMBRATIO" (kappa/lambda light chains)  No results found for: "HGBA",  "HGBA2QUANT", "HGBFQUANT", "HGBSQUAN" (Hemoglobinopathy evaluation)   No results found for: "LDH"  No results found for: "IRON", "TIBC", "IRONPCTSAT" (Iron and TIBC)  No results found for: "FERRITIN"  Urinalysis    Component Value Date/Time   COLORURINE YELLOW 11/08/2011 0912   APPEARANCEUR CLEAR 11/08/2011 0912   LABSPEC 1.023 11/08/2011 0912   PHURINE 5.0 11/08/2011 0912   GLUCOSEU NEGATIVE 11/08/2011 0912   GLUCOSEU NEG mg/dL 62/83/1517 6160   HGBUR NEGATIVE 11/08/2011 0912   HGBUR large 06/01/2009 1449   BILIRUBINUR small (A) 03/20/2019 1142   BILIRUBINUR neg 01/20/2018 1751   KETONESUR negative 03/20/2019 1142   KETONESUR NEGATIVE 11/08/2011 0912   PROTEINUR =30 (A) 03/20/2019 1142   PROTEINUR Positive (A) 01/20/2018 1751   PROTEINUR NEGATIVE 11/08/2011 0912   UROBILINOGEN 2.0 (A) 03/20/2019 1142   UROBILINOGEN 0.2 11/08/2011 0912   NITRITE Positive (A) 03/20/2019 1142   NITRITE neg 01/20/2018 1751   NITRITE NEGATIVE 11/08/2011 0912   LEUKOCYTESUR Large (3+) (A) 03/20/2019 1142    STUDIES:  No results found.   ELIGIBLE FOR AVAILABLE RESEARCH PROTOCOL: no   ASSESSMENT: 47 y.o. [place], [place] woman that is post right breast biopsy 09/17/2018 for a ductal carcinoma in situ, grade 3, estrogen and progesterone receptor negative  (a) biopsy of a suspicious area in the left breast 09/17/2018 was benign  (b) additional right breast biopsies obtained 10/14/2018, both from the lower outer quadrant, showed ductal carcinoma in situ, high to intermediate grade, 1 of which was estrogen and progesterone receptor negative, the other one strongly estrogen and progesterone receptor positive  (1) genetic testing on 09/24/2018 through [facility]'s Breast Cancer STAT Panel + Common Hereditary Cancers Panel showed no pathogenic mutations. The STAT Breast cancer panel offered by [facility] includes sequencing and rearrangement analysis for the following 9 genes:  ATM, BRCA1, BRCA2, CDH1,  CHEK2, PALB2, PTEN, STK11 and TP53. The Common Hereditary Cancers Panel offered by [facility] includes sequencing and/or deletion duplication testing of the following 47 genes: APC, ATM, AXIN2, BARD1, BMPR1A, BRCA1, BRCA2, BRIP1,  CDH1, CDKN2A (p14ARF), CDKN2A (p16INK4a), CKD4, CHEK2, CTNNA1, DICER1, EPCAM (Deletion/duplication testing only), GREM1 (promoter region deletion/duplication testing only), KIT, MEN1, MLH1, MSH2, MSH3, MSH6, MUTYH, NBN, NF1, NHTL1, PALB2, PDGFRA, PMS2, POLD1, POLE, PTEN, RAD50, RAD51C, RAD51D, SDHB, SDHC, SDHD, SMAD4, SMARCA4. STK11, TP53, TSC1, TSC2, and VHL.  The following genes were evaluated for sequence changes only: SDHA and HOXB13 c.251G>A variant only.  (2) right lumpectomy 01/13/2019 showed ductal carcinoma in situ, grade 3, measuring 4 cm, with negative resection margins.  (3) adjuvant radiation 02/23/2019 - 04/14/2019  (a) right breast / 50 Gy in 25 fractions  (b) seroma boost / 14.4 Gy in 8 fractions  (4) started tamoxifen 05/07/2019  (5) She recent had left breast needle biopsy.  Pathology showed a grade 1 invasive ductal carcinoma, ER/PR positive HER2 2+ by IHC FISH negative. Surgery confirmed a 4 mm tumor, Oncotype not done given the small size of the tumor.   PLAN  Post-Radiation Changes Noted persistent pigmentation and texture changes in the breast post-radiation. Possible lymphedema contributing to texture changes. -Continue use of sports compression bras or medical compression bras. -Send prescription for medical compression bra to [facility].  Hormone Therapy for Breast Cancer Patient on Anastrozole and Goserelin with reported side effects of hot flashes and mood swings. Discussed the possibility of switching to Tamoxifen if side effects become intolerable, but noted that Anastrozole is preferred due to progression on Tamoxifen. -Continue Anastrozole and Goserelin.  Bone Health Risk of decreased bone density due to Anastrozole and Goserelin  therapy. Patient not currently engaging in regular exercise. -Encourage weight-bearing exercise for 30 minutes a day, 5 times a week. -Complete course of high-dose Vitamin D once a week for 7 weeks, then switch to over-the-counter Vitamin D supplements. Bone density scheduled.  Breast Cancer Surveillance Last mammogram and MRI were in February 2024. Discussed alternating mammogram and MRI every 6 months for surveillance. -Order MRI for February 2025. -Schedule mammogram for August 2025, to be ordered.  Follow-up Plans -Schedule survivorship visit with Lillia Abed, Nurse Practitioner, in 3 months. -Schedule follow-up visit with oncologist in 6 months.   Total encounter time: 30 min  *Total Encounter Time as defined by the Centers for Medicare and Medicaid Services includes, in addition to the face-to-face time of a patient visit (documented in the note above) non-face-to-face time: obtaining and reviewing outside history, ordering and reviewing medications, tests or procedures, care coordination (communications with other health care professionals or caregivers) and documentation in the medical record.

## 2023-06-13 ENCOUNTER — Telehealth: Payer: Self-pay | Admitting: Family Medicine

## 2023-06-13 NOTE — Telephone Encounter (Signed)
Justin Mend (father [facility] [place]) called stating that he was wondering if we could call the pt to urge the importance of pt getting the [place] ordered for her back in July. Justin Mend also asked that his name not be mentioned in this as he has already tried to stress its importance with her with not having her schedule it.

## 2023-06-14 ENCOUNTER — Encounter: Payer: Self-pay | Admitting: Hematology and Oncology

## 2023-06-14 NOTE — Telephone Encounter (Signed)
Please advise if you're aware of this situation.

## 2023-06-17 ENCOUNTER — Ambulatory Visit: Payer: Medicaid Other | Attending: Hematology and Oncology

## 2023-06-17 DIAGNOSIS — Z483 Aftercare following surgery for neoplasm: Secondary | ICD-10-CM | POA: Insufficient documentation

## 2023-06-18 NOTE — Telephone Encounter (Signed)
Spoke with patient. Pt states she is going to get it done. She has been really busy. Pt given imaging phone number.

## 2023-06-24 ENCOUNTER — Other Ambulatory Visit: Payer: Self-pay | Admitting: Gastroenterology

## 2023-06-24 DIAGNOSIS — K219 Gastro-esophageal reflux disease without esophagitis: Secondary | ICD-10-CM

## 2023-06-24 DIAGNOSIS — K22719 Barrett's esophagus with dysplasia, unspecified: Secondary | ICD-10-CM

## 2023-06-27 ENCOUNTER — Inpatient Hospital Stay: Payer: Medicaid Other | Attending: Hematology and Oncology

## 2023-06-27 VITALS — BP 124/93 | HR 100 | Temp 98.0°F | Resp 18

## 2023-06-27 DIAGNOSIS — C50912 Malignant neoplasm of unspecified site of left female breast: Secondary | ICD-10-CM

## 2023-06-27 DIAGNOSIS — Z5111 Encounter for antineoplastic chemotherapy: Secondary | ICD-10-CM | POA: Insufficient documentation

## 2023-06-27 DIAGNOSIS — C50312 Malignant neoplasm of lower-inner quadrant of left female breast: Secondary | ICD-10-CM | POA: Insufficient documentation

## 2023-06-27 MED ORDER — GOSERELIN ACETATE 3.6 MG ~~LOC~~ IMPL
3.6000 mg | DRUG_IMPLANT | Freq: Once | SUBCUTANEOUS | Status: AC
  Administered 2023-06-27: 3.6 mg via SUBCUTANEOUS
  Filled 2023-06-27: qty 3.6

## 2023-07-05 ENCOUNTER — Other Ambulatory Visit: Payer: Self-pay | Admitting: Family Medicine

## 2023-07-05 DIAGNOSIS — I10 Essential (primary) hypertension: Secondary | ICD-10-CM

## 2023-07-16 ENCOUNTER — Telehealth: Payer: Self-pay | Admitting: Family Medicine

## 2023-07-16 MED ORDER — ANASTROZOLE 1 MG PO TABS: 1.0000 mg | ORAL_TABLET | Freq: Every day | ORAL | 1 refills | Status: DC

## 2023-07-16 NOTE — Telephone Encounter (Signed)
Prescription Request  07/16/2023  Is this a "Controlled Substance" medicine? Yes  LOV: 02/19/2023 --- pt is out // pharmacy is telling her no more refills available  What is the name of the medication or equipment?  anastrozole (ARIMIDEX) 1 MG tablet  Have you contacted your pharmacy to request a refill? No   Which pharmacy would you like this sent to?  [id number] [place], [place] - [address] [place] [address] Phone: [phone] Fax: [phone]    Patient notified that their request is being sent to the clinical staff for review and that they should receive a response within 2 business days.   Please advise at [facility] [phone]

## 2023-07-16 NOTE — Telephone Encounter (Signed)
Refill sent.

## 2023-07-16 NOTE — Addendum Note (Signed)
Addended by: Roxanne Gates on: 07/16/2023 02:35 PM   Modules accepted: Orders

## 2023-07-25 ENCOUNTER — Inpatient Hospital Stay: Payer: Medicaid Other | Attending: Hematology and Oncology

## 2023-07-25 DIAGNOSIS — Z5111 Encounter for antineoplastic chemotherapy: Secondary | ICD-10-CM | POA: Insufficient documentation

## 2023-07-25 DIAGNOSIS — C50312 Malignant neoplasm of lower-inner quadrant of left female breast: Secondary | ICD-10-CM | POA: Insufficient documentation

## 2023-07-26 ENCOUNTER — Inpatient Hospital Stay: Payer: Medicaid Other

## 2023-07-26 VITALS — BP 129/96 | HR 108 | Resp 18

## 2023-07-26 DIAGNOSIS — C50912 Malignant neoplasm of unspecified site of left female breast: Secondary | ICD-10-CM

## 2023-07-26 DIAGNOSIS — C50312 Malignant neoplasm of lower-inner quadrant of left female breast: Secondary | ICD-10-CM | POA: Diagnosis present

## 2023-07-26 DIAGNOSIS — Z5111 Encounter for antineoplastic chemotherapy: Secondary | ICD-10-CM | POA: Diagnosis present

## 2023-07-26 MED ORDER — GOSERELIN ACETATE 3.6 MG ~~LOC~~ IMPL
3.6000 mg | DRUG_IMPLANT | Freq: Once | SUBCUTANEOUS | Status: AC
Start: 2023-07-26 — End: 2023-07-26
  Administered 2023-07-26: 3.6 mg via SUBCUTANEOUS
  Filled 2023-07-26: qty 3.6

## 2023-08-12 ENCOUNTER — Ambulatory Visit (HOSPITAL_BASED_OUTPATIENT_CLINIC_OR_DEPARTMENT_OTHER): Payer: Medicaid Other

## 2023-08-15 ENCOUNTER — Ambulatory Visit (HOSPITAL_BASED_OUTPATIENT_CLINIC_OR_DEPARTMENT_OTHER): Payer: Medicaid Other

## 2023-08-22 ENCOUNTER — Ambulatory Visit: Payer: Self-pay

## 2023-08-22 ENCOUNTER — Encounter: Payer: Self-pay | Admitting: Adult Health

## 2023-08-22 ENCOUNTER — Inpatient Hospital Stay: Payer: Medicaid Other

## 2023-08-22 ENCOUNTER — Inpatient Hospital Stay: Payer: Medicaid Other | Attending: Hematology and Oncology | Admitting: Adult Health

## 2023-08-22 VITALS — BP 141/96 | HR 92 | Temp 100.0°F | Resp 18 | Ht 63.0 in | Wt 150.6 lb

## 2023-08-22 DIAGNOSIS — Z79811 Long term (current) use of aromatase inhibitors: Secondary | ICD-10-CM | POA: Insufficient documentation

## 2023-08-22 DIAGNOSIS — Z5111 Encounter for antineoplastic chemotherapy: Secondary | ICD-10-CM | POA: Insufficient documentation

## 2023-08-22 DIAGNOSIS — C50912 Malignant neoplasm of unspecified site of left female breast: Secondary | ICD-10-CM | POA: Diagnosis not present

## 2023-08-22 DIAGNOSIS — D0511 Intraductal carcinoma in situ of right breast: Secondary | ICD-10-CM

## 2023-08-22 DIAGNOSIS — Z170 Estrogen receptor positive status [ER+]: Secondary | ICD-10-CM | POA: Diagnosis not present

## 2023-08-22 DIAGNOSIS — C50312 Malignant neoplasm of lower-inner quadrant of left female breast: Secondary | ICD-10-CM | POA: Diagnosis present

## 2023-08-22 MED ORDER — GOSERELIN ACETATE 3.6 MG ~~LOC~~ IMPL
3.6000 mg | DRUG_IMPLANT | Freq: Once | SUBCUTANEOUS | Status: AC
Start: 2023-08-22 — End: 2023-08-22
  Administered 2023-08-22: 3.6 mg via SUBCUTANEOUS
  Filled 2023-08-22: qty 3.6

## 2023-08-22 NOTE — Progress Notes (Signed)
SURVIVORSHIP VISIT:  BRIEF ONCOLOGIC HISTORY:  Oncology History  Invasive ductal carcinoma of breast, female, left (HCC)  10/01/2018 Genetic Testing   Genetic testing performed through [facility]'s Breast Cancer STAT Panel + Common Hereditary Cancers Panel showed no pathogenic mutations.    The STAT Breast cancer panel offered by [facility] includes sequencing and rearrangement analysis for the following 9 genes:  ATM, BRCA1, BRCA2, CDH1, CHEK2, PALB2, PTEN, STK11 and TP53.     The Common Hereditary Cancers Panel offered by [facility] includes sequencing and/or deletion duplication testing of the following 47 genes: APC, ATM, AXIN2, BARD1, BMPR1A, BRCA1, BRCA2, BRIP1, CDH1, CDKN2A (p14ARF), CDKN2A (p16INK4a), CKD4, CHEK2, CTNNA1, DICER1, EPCAM (Deletion/duplication testing only), GREM1 (promoter region deletion/duplication testing only), KIT, MEN1, MLH1, MSH2, MSH3, MSH6, MUTYH, NBN, NF1, NHTL1, PALB2, PDGFRA, PMS2, POLD1, POLE, PTEN, RAD50, RAD51C, RAD51D, SDHB, SDHC, SDHD, SMAD4, SMARCA4. STK11, TP53, TSC1, TSC2, and VHL.  The following genes were evaluated for sequence changes only: SDHA and HOXB13 c.251G>A variant only..   05/07/2019 - 12/10/2022 Anti-estrogen oral therapy   Tamoxifen    10/15/2022 Initial Diagnosis   Invasive ductal carcinoma of breast, female, left (HCC)   11/15/2022 Surgery   BREAST, LEFT, LUMPECTOMY:      Invasive ductal carcinoma / Invasive carcinoma of no special type      Tumor size: 0.4 cm in maximal dimension      Histologic Grade: Grade 1 Margins, invasive:  Negative           Closest, invasive:  4 mm from anterior margin Margins, DCIS:  Not identified           Closest, DCIS: NA Lymphovascular invasion:  Not identified Prognostic markers: ER: 100%, Positive, moderate staining intensity PR: 30%, Positive, moderate staining intensity Her2: Negative (By FISH) Ki-67: 5%   12/26/2022 - 01/23/2023 Radiation Therapy   Plan Name: Breast_L_BH Site: Breast, Left Technique:  3D Mode: Photon Dose Per Fraction: 2.66 Gy Prescribed Dose (Delivered / Prescribed): 42.56 Gy / 42.56 Gy Prescribed Fxs (Delivered / Prescribed): 16 / 16   Plan Name: Brst_L_Bst_BH:1 Site: Breast, Left Technique: 3D Mode: Photon Dose Per Fraction: 2 Gy Prescribed Dose (Delivered / Prescribed): 8 Gy / 8 Gy Prescribed Fxs (Delivered / Prescribed): 4 / 4   02/2023 -  Anti-estrogen oral therapy   Anastrozole     INTERVAL HISTORY:  [name] to review her survivorship care plan detailing her treatment course for breast cancer, as well as monitoring long-term side effects of that treatment, education regarding health maintenance, screening, and overall wellness and health promotion.     Overall, [name] reports feeling quite well.  She is taking anastrozole daily.  She experiences hot flashes.    REVIEW OF SYSTEMS:  Review of Systems  Constitutional:  Negative for appetite change, chills, fatigue, fever and unexpected weight change.  HENT:   Negative for hearing loss, lump/mass and trouble swallowing.   Eyes:  Negative for eye problems and icterus.  Respiratory:  Negative for chest tightness, cough and shortness of breath.   Cardiovascular:  Negative for chest pain, leg swelling and palpitations.  Gastrointestinal:  Negative for abdominal distention, abdominal pain, constipation, diarrhea, nausea and vomiting.  Endocrine: Positive for hot flashes.  Genitourinary:  Negative for difficulty urinating.   Musculoskeletal:  Negative for arthralgias.  Skin:  Negative for itching and rash.  Neurological:  Negative for dizziness, extremity weakness, headaches and numbness.  Hematological:  Negative for adenopathy. Does not bruise/bleed easily.  Psychiatric/Behavioral:  Negative for depression.  The patient is not nervous/anxious.   Breast: Denies any new nodularity, masses, tenderness, nipple changes, or nipple discharge.       PAST MEDICAL/SURGICAL HISTORY:  Past Medical History:   Diagnosis Date   Anemia    Back pain    Bimalleolar ankle fracture    left   Breast cancer (HCC)    right breast- 2020   Breast cancer (HCC) 09/2022   left breast   Breast cancer (HCC) 10/2022   Left   Family history of breast cancer    GERD (gastroesophageal reflux disease)    Hypertension    no meds   Kidney infection    1998- hosp[facility]   Personal history of radiation therapy    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, more than 10 years ago- no instances since   Past Surgical History:  Procedure Laterality Date   ANTERIOR LUMBAR FUSION  11/14/2011   Procedure: ANTERIOR LUMBAR FUSION 2 LEVELS;  Surgeon: Javier Docker, MD;  Location: [facility] OR;  Service: Orthopedics;  Laterality: N/A;  ALIF L5-S1   BACK SURGERY     BREAST BIOPSY  11/13/2022   MM LT RADIOACTIVE SEED LOC MAMMO GUIDE 11/13/2022 [facility] MAMMOGRAP[facility]   BREAST LUMPECTOMY Right 01/2019   BREAST LUMPECTOMY WITH RADIOACTIVE SEED AND SENTINEL LYMPH NODE BIOPSY Left 11/15/2022   Procedure: LEFT BREAST LUMPECTOMY WITH RADIOACTIVE SEED AND SENTINEL LYMPH NODE BIOPSY;  Surgeon: Harriette Bouillon, MD;  Location: [facility] OR;  Service: General;  Laterality: Left;   BREAST LUMPECTOMY WITH RADIOACTIVE SEED LOCALIZATION Right 01/13/2019   Procedure: RIGHT BREAST LUMPECTOMY WITH RADIOACTIVE SEED LOCALIZATION X3;  Surgeon: Harriette Bouillon, MD;  Location: [facility];  Service: General;  Laterality: Right;   CESAREAN SECTION     2007- /w epidural  anesthesia    COLONOSCOPY     IRRIGATION AND DEBRIDEMENT ABSCESS Left 11/19/2022   Procedure: IRRIGATION AND DEBRIDEMENT LEFT AXILLA;  Surgeon: Fritzi Mandes, MD;  Location: [facility] OR;  Service: General;  Laterality: Left;   LUMBAR LAMINECTOMY/DECOMPRESSION MICRODISCECTOMY Right 12/15/2015   Procedure:  MICO LUMBER DECOMPRESSION L4-5 ON THE RIGHT, ;  Surgeon: Jene Every, MD;  Location: [facility]S;  Service: Orthopedics;  Laterality: Right;   ORIF ANKLE FRACTURE Left 10/03/2017   Procedure: OPEN REDUCTION  INTERNAL FIXATION (ORIF) ANKLE BIMALLEOLAR FRACTURE; possible deltoid ligament repair;  Surgeon: Toni Arthurs, MD;  Location: [facility];  Service: Orthopedics;  Laterality: Left;   SPINE SURGERY     fusion   UPPER GASTROINTESTINAL ENDOSCOPY     WISDOM TOOTH EXTRACTION     young adult     ALLERGIES:  Allergies  Allergen Reactions   Tamiflu [Oseltamivir Phosphate] Other (See Comments)    Seizure   Cipro [Ciprofloxacin Hcl] Diarrhea and Nausea And Vomiting   Reglan [Metoclopramide] Other (See Comments)    "Lock jaw"   Sulfa Antibiotics Nausea And Vomiting     CURRENT MEDICATIONS:  Outpatient Encounter Medications as of 08/22/2023  Medication Sig   acetaminophen (TYLENOL) 500 MG tablet Take 1,000 mg by mouth every 6 (six) hours as needed for moderate pain.   anastrozole (ARIMIDEX) 1 MG tablet Take 1 tablet (1 mg total) by mouth daily.   ELDERBERRY PO Take 1 tablet by mouth 2 (two) times a week.   goserelin (ZOLADEX) 3.6 MG injection Inject 3.6 mg into the skin every 28 (twenty-eight) days.   metoprolol succinate (TOPROL-XL) 50 MG 24 hr tablet Take 1 tablet (50 mg total) by  mouth daily. Take with or immediately following a meal.   nystatin (MYCOSTATIN/NYSTOP) powder Apply 1 Application topically 3 (three) times daily.   omeprazole (PRILOSEC) 40 MG capsule TAKE 1 CAPSULE BY MOUTH TWICE A DAY   valACYclovir (VALTREX) 1000 MG tablet Take 1 tablet (1,000 mg total) by mouth 3 (three) times daily as needed. For flare ups (Patient not taking: Reported on 11/19/2022)   Vitamin D, Ergocalciferol, (DRISDOL) 1.25 MG (50000 UNIT) CAPS capsule Take 1 capsule (50,000 Units total) by mouth every 7 (seven) days.   [DISCONTINUED] Norgestim-Eth Charlott Holler Triphasic (ORTHO TRI-CYCLEN, 28, PO) Take 0.035 mg by mouth daily.    No facility-administered encounter medications on file as of 08/22/2023.     ONCOLOGIC FAMILY HISTORY:  Family History  Problem Relation Age of Onset   Lupus Mother     Breast cancer Maternal Aunt    Anesthesia problems Neg Hx    Colon cancer Neg Hx    Colon polyps Neg Hx    Esophageal cancer Neg Hx    Rectal cancer Neg Hx    Stomach cancer Neg Hx      SOCIAL HISTORY:  Social History   Socioeconomic History   Marital status: Married    Spouse name: Not on file   Number of children: 1   Years of education: Not on file   Highest education level: Not on file  Occupational History    Comment: working full time  Tobacco Use   Smoking status: Former    Current packs/day: 0.00    Average packs/day: 0.3 packs/day for 15.0 years (3.8 ttl pk-yrs)    Types: Cigarettes    Start date: 04/01/2002    Quit date: 04/01/2017    Years since quitting: 6.3   Smokeless tobacco: Never   Tobacco comments:    vapes daily  Vaping Use   Vaping status: Every Day   Substances: Nicotine  Substance and Sexual Activity   Alcohol use: Yes    Alcohol/week: 1.0 standard drink of alcohol    Types: 1 Glasses of wine per week    Comment: maybe 1 glass of wine per week, if that   Drug use: No   Sexual activity: Not on file  Other Topics Concern   Not on file  Social History Narrative   Not on file   Social Drivers of Health   Financial Resource Strain: Not on file  Food Insecurity: Not on file  Transportation Needs: Not on file  Physical Activity: Not on file  Stress: Not on file  Social Connections: Not on file  Intimate Partner Violence: Not on file     OBSERVATIONS/OBJECTIVE:  BP (!) 141/96 (BP Location: Right Wrist, Patient Position: Sitting) Comment: 129/95 RW  Pulse 92   Temp 100 F (37.8 C) (Tympanic)   Resp 18   Ht 5\' 3"  (1.6 m)   Wt 150 lb 9.6 oz (68.3 kg)   SpO2 96%   BMI 26.68 kg/m  GENERAL: Patient is a well appearing female in no acute distress HEENT:  Sclerae anicteric.  Oropharynx clear and moist. No ulcerations or evidence of oropharyngeal candidiasis. Neck is supple.  NODES:  No cervical, supraclavicular, or axillary  lymphadenopathy palpated.  BREAST EXAM: Left breast status postlumpectomy and radiation no sign of local recurrence right breast is benign. LUNGS:  Clear to auscultation bilaterally.  No wheezes or rhonchi. HEART:  Regular rate and rhythm. No murmur appreciated. ABDOMEN:  Soft, nontender.  Positive, normoactive bowel sounds. No organomegaly palpated. MSK:  No focal spinal tenderness to palpation. Full range of motion bilaterally in the upper extremities. EXTREMITIES:  No peripheral edema.   SKIN:  Clear with no obvious rashes or skin changes. No nail dyscrasia. NEURO:  Nonfocal. Well oriented.  Appropriate affect.   LABORATORY DATA:  None for this visit.  DIAGNOSTIC IMAGING:  None for this visit.      ASSESSMENT AND PLAN:  Ms.. Kelly is a pleasant 48 y.o. female with Stage 1A left breast invasive ductal carcinoma, ER+/PR+/HER2-, diagnosed in April 2024, treated with lumpectomy, adjuvant radiation therapy, and anti-estrogen therapy with anastrozole beginning in 02/2023 with Zoladex given every 4 weeks.  She presents to the Survivorship Clinic for our initial meeting and routine follow-up post-completion of treatment for breast cancer.    1. Stage 1A left breast cancer:  [name] is continuing to recover from definitive treatment for breast cancer. She will follow-up with her medical oncologist, Dr.  Al Pimple in 12 weeks with history and physical exam per surveillance protocol.  She will continue her anti-estrogen therapy with Anastrozole/Zoladex. Thus far, she is tolerating the treatment well, with minimal side effects. Her mammogram is due 09/2023.   Today, a comprehensive survivorship care plan and treatment summary was reviewed with the patient today detailing her breast cancer diagnosis, treatment course, potential late/long-term effects of treatment, appropriate follow-up care with recommendations for the future, and patient education resources.  A copy of this summary, along with a letter  will be sent to the patient's primary care provider via mail/fax/In Basket message after today's visit.    2. Bone health:  Given [name]'s age/history of breast cancer and her current treatment regimen including anti-estrogen therapy with Anastrozole, she is at risk for bone demineralization.  He is scheduled for bone density testing March 2025.  She was given education on specific activities to promote bone health.  3. Cancer screening:  Due to [name]'s history and her age, she should receive screening for skin cancers, colon cancer, and gynecologic cancers.  The information and recommendations are listed on the patient's comprehensive care plan/treatment summary and were reviewed in detail with the patient.    4. Health maintenance and wellness promotion: [name] was encouraged to consume 5-7 servings of fruits and vegetables per day. We reviewed the "Nutrition Rainbow" handout.  She was also encouraged to engage in moderate to vigorous exercise for 30 minutes per day most days of the week.  She was instructed to limit her alcohol consumption and continue to abstain from tobacco use, and consider quitting vaping.     5. Support services/counseling: It is not uncommon for this period of the patient's cancer care trajectory to be one of many emotions and stressors.   She was given information regarding our available services and encouraged to contact me with any questions or for help enrolling in any of our support group/programs.    Follow up instructions:    -Return to cancer center for f/u with Dr. Al Pimple in April, 2025  -Mammogram due in 05/2024 -breast MRI 11/2023 -She is welcome to return back to the Survivorship Clinic at any time; no additional follow-up needed at this time.  -Consider referral back to survivorship as a long-term survivor for continued surveillance  The patient was provided an opportunity to ask questions and all were answered. The patient agreed with the plan and  demonstrated an understanding of the instructions.   Total encounter time:40 minutes*in face-to-face visit time, chart review, lab review, care coordination, order  entry, and documentation of the encounter time.  Lillard Anes, NP 08/22/23 2:02 PM Medical Oncology and Hematology [facility] [address] [place], [place] [address] Tel. [phone]    Fax. [phone]  *Total Encounter Time as defined by the Centers for Medicare and Medicaid Services includes, in addition to the face-to-face time of a patient visit (documented in the note above) non-face-to-face time: obtaining and reviewing outside history, ordering and reviewing medications, tests or procedures, care coordination (communications with other health care professionals or caregivers) and documentation in the medical record.

## 2023-08-23 ENCOUNTER — Encounter: Payer: Self-pay | Admitting: Hematology and Oncology

## 2023-09-02 ENCOUNTER — Telehealth: Payer: Self-pay | Admitting: *Deleted

## 2023-09-02 NOTE — Telephone Encounter (Signed)
This RN spoke with pt per call stating she had her zoladex shot a little over a week ago.  The area of injection now is "just sore and bruised and there is a hard knot under it".  [name] states "usually it doesn't hurt but that day I left crying"  She denies any redness or swelling at site.  This RN discussed above issues of a large sub que shot and management of discomfort.  She will monitor the site and call if area worsens or does not improve.  She is asking to not have the nurse who gave her this shot  again.  This note will be forwarded to management for follow up per pt's request.

## 2023-09-04 ENCOUNTER — Encounter: Payer: Self-pay | Admitting: Hematology and Oncology

## 2023-09-18 ENCOUNTER — Other Ambulatory Visit: Payer: Self-pay | Admitting: *Deleted

## 2023-09-19 ENCOUNTER — Inpatient Hospital Stay: Payer: Medicaid Other | Attending: Hematology and Oncology

## 2023-09-19 VITALS — BP 132/96 | HR 105 | Temp 100.3°F | Resp 18

## 2023-09-19 DIAGNOSIS — C50912 Malignant neoplasm of unspecified site of left female breast: Secondary | ICD-10-CM

## 2023-09-19 DIAGNOSIS — Z5111 Encounter for antineoplastic chemotherapy: Secondary | ICD-10-CM | POA: Diagnosis present

## 2023-09-19 DIAGNOSIS — C50312 Malignant neoplasm of lower-inner quadrant of left female breast: Secondary | ICD-10-CM | POA: Diagnosis present

## 2023-09-19 MED ORDER — GOSERELIN ACETATE 3.6 MG ~~LOC~~ IMPL
3.6000 mg | DRUG_IMPLANT | Freq: Once | SUBCUTANEOUS | Status: AC
  Administered 2023-09-19: 3.6 mg via SUBCUTANEOUS
  Filled 2023-09-19: qty 3.6

## 2023-09-20 ENCOUNTER — Encounter: Payer: Self-pay | Admitting: Hematology and Oncology

## 2023-09-28 ENCOUNTER — Other Ambulatory Visit: Payer: Self-pay | Admitting: Family Medicine

## 2023-09-28 DIAGNOSIS — I10 Essential (primary) hypertension: Secondary | ICD-10-CM

## 2023-10-10 ENCOUNTER — Other Ambulatory Visit: Payer: Self-pay | Admitting: Family Medicine

## 2023-10-10 MED ORDER — LOSARTAN POTASSIUM 25 MG PO TABS: 25.0000 mg | ORAL_TABLET | Freq: Every day | ORAL | 0 refills | Status: DC

## 2023-10-10 NOTE — Telephone Encounter (Signed)
 Copied from CRM [id number]. Topic: Clinical - Medication Refill >> Oct 10, 2023  8:30 AM Marica Otter wrote: Most Recent Primary Care Visit:  Provider: Seabron Spates R  Department: [facility]  Visit Type: OFFICE VISIT  Date: 02/19/2023  Medication: losartan (COZAAR) 25 MG tablet  Has the patient contacted their pharmacy? Yes, [facility] stated office wouldn't refill (Agent: If no, request that the patient contact the pharmacy for the refill. If patient does not wish to contact the pharmacy document the reason why and proceed with request.) (Agent: If yes, when and what did the pharmacy advise?)  Is this the correct pharmacy for this prescription? Yes If no, delete pharmacy and type the correct one.  This is the patient's preferred pharmacy:  [facility] [phone] [place] Otto, [place] - [address] [place] [address] Phone: [phone] Fax: [phone]   Has the prescription been filled recently? No  Is the patient out of the medication? Yes  Has the patient been seen for an appointment in the last year OR does the patient have an upcoming appointment? Yes  Can we respond through MyChart? Yes  Agent: Please be advised that Rx refills may take up to 3 business days. We ask that you follow-up with your pharmacy.

## 2023-10-17 ENCOUNTER — Other Ambulatory Visit: Payer: 59

## 2023-10-17 ENCOUNTER — Inpatient Hospital Stay: Payer: 59

## 2023-10-18 ENCOUNTER — Ambulatory Visit
Admission: RE | Admit: 2023-10-18 | Discharge: 2023-10-18 | Disposition: A | Discharge status: Home / Self Care | Source: Ambulatory Visit | Attending: Hematology and Oncology | Admitting: Hematology and Oncology

## 2023-10-18 ENCOUNTER — Inpatient Hospital Stay: Attending: Hematology and Oncology

## 2023-10-18 VITALS — BP 124/95 | HR 73 | Temp 99.0°F | Resp 17

## 2023-10-18 DIAGNOSIS — C50312 Malignant neoplasm of lower-inner quadrant of left female breast: Secondary | ICD-10-CM | POA: Insufficient documentation

## 2023-10-18 DIAGNOSIS — N958 Other specified menopausal and perimenopausal disorders: Secondary | ICD-10-CM | POA: Diagnosis not present

## 2023-10-18 DIAGNOSIS — C50912 Malignant neoplasm of unspecified site of left female breast: Secondary | ICD-10-CM

## 2023-10-18 DIAGNOSIS — D539 Nutritional anemia, unspecified: Secondary | ICD-10-CM

## 2023-10-18 DIAGNOSIS — Z5111 Encounter for antineoplastic chemotherapy: Secondary | ICD-10-CM | POA: Insufficient documentation

## 2023-10-18 DIAGNOSIS — M818 Other osteoporosis without current pathological fracture: Secondary | ICD-10-CM | POA: Diagnosis not present

## 2023-10-18 DIAGNOSIS — E2839 Other primary ovarian failure: Secondary | ICD-10-CM | POA: Diagnosis not present

## 2023-10-18 MED ORDER — GOSERELIN ACETATE 3.6 MG ~~LOC~~ IMPL
3.6000 mg | DRUG_IMPLANT | Freq: Once | SUBCUTANEOUS | Status: AC
Start: 2023-10-18 — End: 2023-10-18
  Administered 2023-10-18: 3.6 mg via SUBCUTANEOUS
  Filled 2023-10-18: qty 3.6

## 2023-10-21 ENCOUNTER — Ambulatory Visit (HOSPITAL_BASED_OUTPATIENT_CLINIC_OR_DEPARTMENT_OTHER)
Admission: RE | Admit: 2023-10-21 | Discharge: 2023-10-21 | Disposition: A | Discharge status: Home / Self Care | Source: Ambulatory Visit | Attending: Family Medicine | Admitting: Family Medicine

## 2023-10-21 DIAGNOSIS — R748 Abnormal levels of other serum enzymes: Secondary | ICD-10-CM | POA: Insufficient documentation

## 2023-11-12 NOTE — Progress Notes (Unsigned)
 BRIEF ONCOLOGIC HISTORY:  Oncology History  Invasive ductal carcinoma of breast, female, left (HCC)  10/01/2018 Genetic Testing   Genetic testing performed through [facility]'s Breast Cancer STAT Panel + Common Hereditary Cancers Panel showed no pathogenic mutations.    The STAT Breast cancer panel offered by [facility] includes sequencing and rearrangement analysis for the following 9 genes:  ATM, BRCA1, BRCA2, CDH1, CHEK2, PALB2, PTEN, STK11 and TP53.     The Common Hereditary Cancers Panel offered by [facility] includes sequencing and/or deletion duplication testing of the following 47 genes: APC, ATM, AXIN2, BARD1, BMPR1A, BRCA1, BRCA2, BRIP1, CDH1, CDKN2A (p14ARF), CDKN2A (p16INK4a), CKD4, CHEK2, CTNNA1, DICER1, EPCAM (Deletion/duplication testing only), GREM1 (promoter region deletion/duplication testing only), KIT, MEN1, MLH1, MSH2, MSH3, MSH6, MUTYH, NBN, NF1, NHTL1, PALB2, PDGFRA, PMS2, POLD1, POLE, PTEN, RAD50, RAD51C, RAD51D, SDHB, SDHC, SDHD, SMAD4, SMARCA4. STK11, TP53, TSC1, TSC2, and VHL.  The following genes were evaluated for sequence changes only: SDHA and HOXB13 c.251G>A variant only..   05/07/2019 - 12/10/2022 Anti-estrogen oral therapy   Tamoxifen    10/15/2022 Initial Diagnosis   Invasive ductal carcinoma of breast, female, left (HCC)   11/15/2022 Surgery   BREAST, LEFT, LUMPECTOMY:      Invasive ductal carcinoma / Invasive carcinoma of no special type      Tumor size: 0.4 cm in maximal dimension      Histologic Grade: Grade 1 Margins, invasive:  Negative           Closest, invasive:  4 mm from anterior margin Margins, DCIS:  Not identified           Closest, DCIS: NA Lymphovascular invasion:  Not identified Prognostic markers: ER: 100%, Positive, moderate staining intensity PR: 30%, Positive, moderate staining intensity Her2: Negative (By FISH) Ki-67: 5%   12/26/2022 - 01/23/2023 Radiation Therapy   Plan Name: Breast_L_BH Site: Breast, Left Technique: 3D Mode: Photon Dose  Per Fraction: 2.66 Gy Prescribed Dose (Delivered / Prescribed): 42.56 Gy / 42.56 Gy Prescribed Fxs (Delivered / Prescribed): 16 / 16   Plan Name: Brst_L_Bst_BH:1 Site: Breast, Left Technique: 3D Mode: Photon Dose Per Fraction: 2 Gy Prescribed Dose (Delivered / Prescribed): 8 Gy / 8 Gy Prescribed Fxs (Delivered / Prescribed): 4 / 4   02/2023 -  Anti-estrogen oral therapy   Anastrozole   08/22/2023 Cancer Staging   Staging form: Breast, AJCC 8th Edition - Pathologic: Stage IA (pT1a, pN0, cM0, G1, ER+, PR+, HER2-) - Signed by Loa Socks, NP on 08/22/2023 Histologic grading system: 3 grade system     INTERVAL HISTORY:   Discussed the use of AI scribe software for clinical note transcription with the patient, who gave verbal consent to proceed.  History of Present Illness [name] "[name]" is a 48 year old female with breast cancer who presents for follow-up regarding her treatment and medication management.  She is undergoing treatment for breast cancer with anastrozole and monthly Zoladex injections. She experiences significant hot flashes, particularly in the afternoon and night, lasting up to ten minutes and occurring every two hours, disrupting her sleep. She is hesitant to start any new medication for the hot flashes due to concerns about side effects. No new side effects from her current medications aside from the hot flashes. No palpable masses in her breasts and no new symptoms related to her breast cancer treatment.  She has been on anastrozole since July 2024 and notes that her blood pressure has been elevated since starting this medication, with home monitoring showing the  diastolic number usually around 96. She attributes some of the elevated blood pressure to stress, exacerbated by a recent job layoff.  She receives monthly Zoladex injections, with the last one administered on October 18, 2023. She mentions that the timing of her injections feels  early, as they are typically scheduled mid-month. She is aware of the high cost of Zoladex and is considering insurance options as her Medicaid coverage will end soon. She is exploring the possibility of switching to a three-month dosing schedule or considering an oophorectomy to eliminate the need for injections.  She has a history of sciatica and previously took gabapentin for it but prefers to avoid medications unless necessary. She reports some residual issues with sciatica but manages without medication.  She was recently laid off from work, impacting her mental state and appetite. She is incorporating more fruits and vegetables into her diet as part of a Lent[place] commitment and walks regularly with her son, who is interested in welding.  Rest of the pertinent 10 point ROS reviewed and neg.    PAST MEDICAL/SURGICAL HISTORY:  Past Medical History:  Diagnosis Date   Anemia    Back pain    Bimalleolar ankle fracture    left   Breast cancer (HCC)    right breast- 2020   Breast cancer (HCC) 09/2022   left breast   Breast cancer (HCC) 10/2022   Left   Family history of breast cancer    GERD (gastroesophageal reflux disease)    Hypertension    no meds   Kidney infection    1998- [facility]   Personal history of radiation therapy    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, more than 10 years ago- no instances since   Past Surgical History:  Procedure Laterality Date   ANTERIOR LUMBAR FUSION  11/14/2011   Procedure: ANTERIOR LUMBAR FUSION 2 LEVELS;  Surgeon: Javier Docker, MD;  Location: [facility] OR;  Service: Orthopedics;  Laterality: N/A;  ALIF L5-S1   BACK SURGERY     BREAST BIOPSY  11/13/2022   MM LT RADIOACTIVE SEED LOC MAMMO GUIDE 11/13/2022 [facility] MAMMOGRAPHY   BREAST LUMPECTOMY Right 01/2019   BREAST LUMPECTOMY WITH RADIOACTIVE SEED AND SENTINEL LYMPH NODE BIOPSY Left 11/15/2022   Procedure: LEFT BREAST LUMPECTOMY WITH RADIOACTIVE SEED AND SENTINEL LYMPH NODE BIOPSY;  Surgeon: Harriette Bouillon, MD;  Location: [facility] OR;  Service: General;  Laterality: Left;   BREAST LUMPECTOMY WITH RADIOACTIVE SEED LOCALIZATION Right 01/13/2019   Procedure: RIGHT BREAST LUMPECTOMY WITH RADIOACTIVE SEED LOCALIZATION X3;  Surgeon: Harriette Bouillon, MD;  Location: [facility];  Service: General;  Laterality: Right;   CESAREAN SECTION     2007- /w epidural  anesthesia    COLONOSCOPY     IRRIGATION AND DEBRIDEMENT ABSCESS Left 11/19/2022   Procedure: IRRIGATION AND DEBRIDEMENT LEFT AXILLA;  Surgeon: Fritzi Mandes, MD;  Location: [facility] OR;  Service: General;  Laterality: Left;   LUMBAR LAMINECTOMY/DECOMPRESSION MICRODISCECTOMY Right 12/15/2015   Procedure:  MICO LUMBER DECOMPRESSION L4-5 ON THE RIGHT, ;  Surgeon: Jene Every, MD;  Location: [facility];  Service: Orthopedics;  Laterality: Right;   ORIF ANKLE FRACTURE Left 10/03/2017   Procedure: OPEN REDUCTION INTERNAL FIXATION (ORIF) ANKLE BIMALLEOLAR FRACTURE; possible deltoid ligament repair;  Surgeon: Toni Arthurs, MD;  Location: [facility];  Service: Orthopedics;  Laterality: Left;   SPINE SURGERY     fusion   UPPER GASTROINTESTINAL ENDOSCOPY     WISDOM TOOTH EXTRACTION  young adult     ALLERGIES:  Allergies  Allergen Reactions   Tamiflu [Oseltamivir Phosphate] Other (See Comments)    Seizure   Cipro [Ciprofloxacin Hcl] Diarrhea and Nausea And Vomiting   Reglan [Metoclopramide] Other (See Comments)    "Lock jaw"   Sulfa Antibiotics Nausea And Vomiting     CURRENT MEDICATIONS:  Outpatient Encounter Medications as of 11/13/2023  Medication Sig   acetaminophen (TYLENOL) 500 MG tablet Take 1,000 mg by mouth every 6 (six) hours as needed for moderate pain.   anastrozole (ARIMIDEX) 1 MG tablet Take 1 tablet (1 mg total) by mouth daily.   ELDERBERRY PO Take 1 tablet by mouth 2 (two) times a week.   goserelin (ZOLADEX) 3.6 MG injection Inject 3.6 mg into the skin every 28 (twenty-eight) days.   losartan (COZAAR) 25 MG  tablet Take 1 tablet (25 mg total) by mouth daily.   metoprolol succinate (TOPROL-XL) 50 MG 24 hr tablet Take 1 tablet (50 mg total) by mouth daily. Take with or immediately following a meal.   omeprazole (PRILOSEC) 40 MG capsule TAKE 1 CAPSULE BY MOUTH TWICE A DAY   valACYclovir (VALTREX) 1000 MG tablet Take 1 tablet (1,000 mg total) by mouth 3 (three) times daily as needed. For flare ups   Vitamin D, Ergocalciferol, (DRISDOL) 1.25 MG (50000 UNIT) CAPS capsule Take 1 capsule (50,000 Units total) by mouth every 7 (seven) days.   [DISCONTINUED] Norgestim-Eth Charlott Holler Triphasic (ORTHO TRI-CYCLEN, 28, PO) Take 0.035 mg by mouth daily.    No facility-administered encounter medications on file as of 11/13/2023.     ONCOLOGIC FAMILY HISTORY:  Family History  Problem Relation Age of Onset   Lupus Mother    Breast cancer Maternal Aunt    Anesthesia problems Neg Hx    Colon cancer Neg Hx    Colon polyps Neg Hx    Esophageal cancer Neg Hx    Rectal cancer Neg Hx    Stomach cancer Neg Hx      SOCIAL HISTORY:  Social History   Socioeconomic History   Marital status: Married    Spouse name: Not on file   Number of children: 1   Years of education: Not on file   Highest education level: Not on file  Occupational History    Comment: working full time  Tobacco Use   Smoking status: Former    Current packs/day: 0.00    Average packs/day: 0.3 packs/day for 15.0 years (3.8 ttl pk-yrs)    Types: Cigarettes    Start date: 04/01/2002    Quit date: 04/01/2017    Years since quitting: 6.6   Smokeless tobacco: Never   Tobacco comments:    vapes daily  Vaping Use   Vaping status: Every Day   Substances: Nicotine  Substance and Sexual Activity   Alcohol use: Yes    Alcohol/week: 1.0 standard drink of alcohol    Types: 1 Glasses of wine per week    Comment: maybe 1 glass of wine per week, if that   Drug use: No   Sexual activity: Not on file  Other Topics Concern   Not on file  Social  History Narrative   Not on file   Social Drivers of Health   Financial Resource Strain: Not on file  Food Insecurity: Not on file  Transportation Needs: Not on file  Physical Activity: Not on file  Stress: Not on file  Social Connections: Not on file  Intimate Partner Violence: Not on file  OBSERVATIONS/OBJECTIVE:  BP (!) 151/97 (BP Location: Right Arm, Patient Position: Sitting)   Pulse (!) 127   Temp 98.3 F (36.8 C) (Temporal)   Resp 18   Wt 146 lb 8 oz (66.5 kg)   SpO2 98%   BMI 25.95 kg/m  GENERAL: Patient is a well appearing female in no acute distress HEENT:  Sclerae anicteric.  Oropharynx clear and moist. No ulcerations or evidence of oropharyngeal candidiasis. Neck is supple.  NODES:  No cervical, supraclavicular, or axillary lymphadenopathy palpated.  BREAST EXAM: Left breast status postlumpectomy and radiation no sign of local recurrence right breast is benign. LUNGS:  Clear to auscultation bilaterally.  No wheezes or rhonchi. HEART:  Regular rate and rhythm. No murmur appreciated. ABDOMEN:  Soft, nontender.  Positive, normoactive bowel sounds. No organomegaly palpated. MSK:  No focal spinal tenderness to palpation. Full range of motion bilaterally in the upper extremities. EXTREMITIES:  No peripheral edema.   SKIN:  Clear with no obvious rashes or skin changes. No nail dyscrasia. NEURO:  Nonfocal. Well oriented.  Appropriate affect.   LABORATORY DATA:  None for this visit.  DIAGNOSTIC IMAGING:  None for this visit.      ASSESSMENT AND PLAN:   Ms.. Lewers is a pleasant 48 y.o. female with Stage 1A left breast invasive ductal carcinoma, ER+/PR+/HER2-, diagnosed in April 2024, treated with lumpectomy, adjuvant radiation therapy, and anti-estrogen therapy with anastrozole beginning in 02/2023 with Zoladex/anastrozole given every 4 weeks.    Assessment & Plan Breast Cancer Undergoing treatment with anastrozole and Zoladex. Severe hot flashes noted.  Considering three-month Zoladex dosing or oophorectomy due to cost and convenience. MRI scheduled for November 29, 2023, with alternating mammograms and MRIs every six months. MRI preferred for diagnostic accuracy. - Administer Zoladex injection as scheduled. - Order MRI for November 29, 2023. - Order mammogram for October 2025. - Discuss oophorectomy as an alternative to Zoladex injections.  Hypertension Diastolic blood pressure around 96 mmHg, possibly influenced by stress and anastrozole. Scheduled consultation with primary care physician for medication adjustment. - Discuss blood pressure management with primary care physician on Friday.  Bone Health Bone density scan shows good bone strength. Advised to continue weight-bearing exercises and a diet rich in fruits and vegetables. - Encourage weight-bearing exercises. - Encourage a diet rich in fruits and vegetables. - Repeat bone density scan in two years.  Time spent: 30 min  *Total Encounter Time as defined by the Centers for Medicare and Medicaid Services includes, in addition to the face-to-face time of a patient visit (documented in the note above) non-face-to-face time: obtaining and reviewing outside history, ordering and reviewing medications, tests or procedures, care coordination (communications with other health care professionals or caregivers) and documentation in the medical record.

## 2023-11-13 ENCOUNTER — Inpatient Hospital Stay (HOSPITAL_BASED_OUTPATIENT_CLINIC_OR_DEPARTMENT_OTHER): Payer: 59 | Admitting: Hematology and Oncology

## 2023-11-13 ENCOUNTER — Inpatient Hospital Stay: Payer: 59 | Attending: Hematology and Oncology

## 2023-11-13 VITALS — BP 151/97 | HR 127 | Temp 98.3°F | Resp 18 | Wt 146.5 lb

## 2023-11-13 DIAGNOSIS — I10 Essential (primary) hypertension: Secondary | ICD-10-CM | POA: Insufficient documentation

## 2023-11-13 DIAGNOSIS — C50912 Malignant neoplasm of unspecified site of left female breast: Secondary | ICD-10-CM

## 2023-11-13 DIAGNOSIS — Z170 Estrogen receptor positive status [ER+]: Secondary | ICD-10-CM | POA: Insufficient documentation

## 2023-11-13 DIAGNOSIS — Z5111 Encounter for antineoplastic chemotherapy: Secondary | ICD-10-CM | POA: Insufficient documentation

## 2023-11-13 DIAGNOSIS — Z79811 Long term (current) use of aromatase inhibitors: Secondary | ICD-10-CM | POA: Diagnosis not present

## 2023-11-13 DIAGNOSIS — D0511 Intraductal carcinoma in situ of right breast: Secondary | ICD-10-CM | POA: Diagnosis not present

## 2023-11-13 DIAGNOSIS — C50312 Malignant neoplasm of lower-inner quadrant of left female breast: Secondary | ICD-10-CM | POA: Insufficient documentation

## 2023-11-13 DIAGNOSIS — N951 Menopausal and female climacteric states: Secondary | ICD-10-CM | POA: Diagnosis not present

## 2023-11-13 MED ORDER — GOSERELIN ACETATE 3.6 MG ~~LOC~~ IMPL
3.6000 mg | DRUG_IMPLANT | Freq: Once | SUBCUTANEOUS | Status: AC
  Administered 2023-11-13: 3.6 mg via SUBCUTANEOUS
  Filled 2023-11-13: qty 3.6

## 2023-11-14 ENCOUNTER — Telehealth: Payer: Self-pay | Admitting: Hematology and Oncology

## 2023-11-14 ENCOUNTER — Encounter: Payer: Self-pay | Admitting: Hematology and Oncology

## 2023-11-14 NOTE — Telephone Encounter (Signed)
 Left vm for pt about scheduled appt date and time.

## 2023-11-15 ENCOUNTER — Ambulatory Visit: Admitting: Family Medicine

## 2023-11-26 ENCOUNTER — Encounter: Payer: Self-pay | Admitting: Hematology and Oncology

## 2023-11-27 ENCOUNTER — Encounter: Payer: Self-pay | Admitting: Hematology and Oncology

## 2023-11-29 ENCOUNTER — Inpatient Hospital Stay: Admission: RE | Admit: 2023-11-29 | Payer: 59 | Source: Ambulatory Visit

## 2023-11-29 ENCOUNTER — Encounter: Payer: Self-pay | Admitting: Hematology and Oncology

## 2023-12-05 ENCOUNTER — Telehealth: Payer: Self-pay | Admitting: *Deleted

## 2023-12-05 ENCOUNTER — Telehealth: Payer: Self-pay

## 2023-12-05 NOTE — Telephone Encounter (Signed)
 This RN spoke with pt per her call asking about her medicaid (obtained thru [employer]) and need to extend.  She is unsure who to call - this RN gave her the number for [employer] - which may mean leaving a message and someone will return her call.  Of note she is unsure when her current medicaid expires and now that she is unemployed how to continue coverage.  This message will be forwarded to our outreach/[employer] department for review of pt's needs.

## 2023-12-05 NOTE — Telephone Encounter (Signed)
 Patient called to confirm [employer] medicaid coverage is still active, needs to get [facility] Medicaid card. Patient informed that [employer] Medicaid is still active, and was given MID # and contact information to request card.

## 2023-12-13 ENCOUNTER — Inpatient Hospital Stay: Attending: Hematology and Oncology

## 2023-12-13 VITALS — BP 127/94 | HR 88 | Resp 18

## 2023-12-13 DIAGNOSIS — C50912 Malignant neoplasm of unspecified site of left female breast: Secondary | ICD-10-CM

## 2023-12-13 DIAGNOSIS — C50312 Malignant neoplasm of lower-inner quadrant of left female breast: Secondary | ICD-10-CM | POA: Insufficient documentation

## 2023-12-13 DIAGNOSIS — Z5111 Encounter for antineoplastic chemotherapy: Secondary | ICD-10-CM | POA: Insufficient documentation

## 2023-12-13 MED ORDER — GOSERELIN ACETATE 3.6 MG ~~LOC~~ IMPL
3.6000 mg | DRUG_IMPLANT | Freq: Once | SUBCUTANEOUS | Status: AC
  Administered 2023-12-13: 3.6 mg via SUBCUTANEOUS
  Filled 2023-12-13: qty 3.6

## 2023-12-23 ENCOUNTER — Telehealth: Payer: Self-pay | Admitting: *Deleted

## 2023-12-23 NOTE — Telephone Encounter (Signed)
 Patient called with concerns about her [employer] medicaid. She said she does not know when it will expire. She's called the numbers she was given and cannot get thru to anyone. Her concern is that it might expire at the end of this month and she won't know about it and will need to have care that won't be covered. Message sent to [facility], LPN with [facility] asking if she can contact patient and assist her with finding out this information. Patient informed message sent and verbalized understanding.

## 2023-12-24 ENCOUNTER — Telehealth: Payer: Self-pay

## 2023-12-24 NOTE — Telephone Encounter (Addendum)
 I contacted patient informed patient [employer] Medicaid is not ending at the end of the month. It will be renewed as long as she is still in treatment, including oral antiestrogens. Patient verbalized understanding, stated she was informed the same information per Sherrel Dodge, [facility] DSS Representative.      ----- Message from Nurse Katherne Pander sent at 12/23/2023  2:54 PM EDT ----- Regarding: Assist with Medicaid Hi [name], [name] contacted Dr. Lorella Roles office with questions about when her Medicaid will expire. She said she needs help understanding this process and said she'd called the numbers provided and can't reach anyone. Would you please reach out to her. Thank you, Sherilyn Dings RN

## 2023-12-25 ENCOUNTER — Ambulatory Visit
Admission: RE | Admit: 2023-12-25 | Discharge: 2023-12-25 | Disposition: A | Discharge status: Home / Self Care | Source: Ambulatory Visit | Attending: Hematology and Oncology | Admitting: Hematology and Oncology

## 2023-12-25 DIAGNOSIS — Z853 Personal history of malignant neoplasm of breast: Secondary | ICD-10-CM | POA: Diagnosis not present

## 2023-12-25 DIAGNOSIS — C50912 Malignant neoplasm of unspecified site of left female breast: Secondary | ICD-10-CM

## 2023-12-25 DIAGNOSIS — Z1239 Encounter for other screening for malignant neoplasm of breast: Secondary | ICD-10-CM | POA: Diagnosis not present

## 2023-12-25 MED ORDER — GADOPICLENOL 0.5 MMOL/ML IV SOLN
7.0000 mL | Freq: Once | INTRAVENOUS | Status: AC | PRN
  Administered 2023-12-25: 7 mL via INTRAVENOUS

## 2024-01-02 ENCOUNTER — Encounter: Payer: Self-pay | Admitting: Family Medicine

## 2024-01-02 ENCOUNTER — Ambulatory Visit (INDEPENDENT_AMBULATORY_CARE_PROVIDER_SITE_OTHER): Admitting: Family Medicine

## 2024-01-02 VITALS — BP 118/86 | HR 86 | Temp 98.9°F | Resp 18 | Ht 63.0 in | Wt 143.2 lb

## 2024-01-02 DIAGNOSIS — I10 Essential (primary) hypertension: Secondary | ICD-10-CM | POA: Diagnosis not present

## 2024-01-02 DIAGNOSIS — C50912 Malignant neoplasm of unspecified site of left female breast: Secondary | ICD-10-CM | POA: Diagnosis not present

## 2024-01-02 DIAGNOSIS — R748 Abnormal levels of other serum enzymes: Secondary | ICD-10-CM

## 2024-01-02 NOTE — Patient Instructions (Signed)

## 2024-01-02 NOTE — Progress Notes (Signed)
 Established Patient Office Visit  Subjective   Patient ID: [name], female    DOB: Dec 10, 1975  Age: 48 y.o. MRN: [id number]  Chief Complaint  Patient presents with   Hypertension    Pt states bps have been running high at home.     HPI Discussed the use of AI scribe software for clinical note transcription with the patient, who gave verbal consent to proceed.  History of Present Illness [name] "[name]" is a 48 year old female with hypertension and cancer who presents with concerns about high blood pressure readings.  She has been experiencing elevated blood pressure readings at home and is uncertain about her accuracy due to potential incorrect placement of the blood pressure cuff. She is concerned about the impact of her cancer treatment on her blood pressure.  She is currently undergoing cancer treatment and receives Zoladex  injections in her abdomen, which have induced menopausal symptoms such as hot flashes. She experiences bruising at the injection site, which varies depending on the person administering the injection. She prefers a specific nurse who administers the injections without causing bruising, but this nurse has moved to a different department.  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  She underwent an MRI recently, which required multiple attempts to insert the IV for contrast, resulting in bruising. The MRI results were clear. She also completed an ultrasound of her liver in March.  Her current medications include Zoladex  injections for cancer treatment. She has not been eating well recently, although she cooks and eats dinner regularly.   Patient Active Problem List   Diagnosis Date Noted   Tremor 02/19/2023   Hematoma of left axilla 11/19/2022   S/P debridement  11/19/2022   Invasive ductal carcinoma of breast, female, left (HCC) 10/15/2022   Fever, unspecified 03/23/2019   Essential hypertension 03/13/2019   Elevated liver enzymes 11/06/2018   Genetic testing 10/06/2018   Family history of breast cancer    Dry eye 09/02/2017   Rectal bleed 05/20/2017   Elevated BP without diagnosis of hypertension 05/20/2017   Other dysphagia 02/15/2016   HNP (herniated nucleus pulposus), lumbar 12/15/2015   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   URI (upper respiratory infection) 10/17/2011   Displacement of intervertebral disc, site unspecified, without myelopathy 09/04/2011   Herpes genitalis in women 03/05/2011   Constipation 01/16/2011   WRIST PAIN, LEFT 10/17/2010   Enlarged lymph nodes 08/15/2010   HEMATURIA UNSPECIFIED 06/01/2009   LOW BACK PAIN, CHRONIC 06/01/2009   Benign neoplasm of skin 11/19/2008   CELLULITIS/ABSCESS NOS 09/30/2008   GENITAL HERPES, HX OF 11/18/2007   DEGENERATIVE DISC DISEASE 10/08/2007   Gastritis and gastroduodenitis 06/25/2007   ABSENCE OF MENSTRUATION 06/25/2007   Backache 06/25/2007   GERD 09/18/2006   Past Medical History:  Diagnosis Date   Anemia    Back pain    Bimalleolar ankle fracture    left   Breast cancer (HCC)    right breast- 2020   Breast cancer (HCC) 09/2022   left breast   Breast cancer (HCC) 10/2022   Left   Family history of breast cancer    GERD (gastroesophageal reflux disease)    Hypertension    no meds   Kidney infection    1998- hosp[facility]   Personal history of radiation  therapy    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, more than 10 years ago- no instances since   Past Surgical History:  Procedure Laterality Date   ANTERIOR LUMBAR FUSION  11/14/2011   Procedure: ANTERIOR LUMBAR FUSION 2 LEVELS;  Surgeon: Loel Ring, MD;  Location: [facility] OR;  Service: Orthopedics;  Laterality: N/A;  ALIF L5-S1   BACK SURGERY     BREAST BIOPSY  11/13/2022   MM LT RADIOACTIVE  SEED LOC MAMMO GUIDE 11/13/2022 [facility] MAMMOGRAPHY   BREAST LUMPECTOMY Right 01/2019   BREAST LUMPECTOMY WITH RADIOACTIVE SEED AND SENTINEL LYMPH NODE BIOPSY Left 11/15/2022   Procedure: LEFT BREAST LUMPECTOMY WITH RADIOACTIVE SEED AND SENTINEL LYMPH NODE BIOPSY;  Surgeon: Sim Dryer, MD;  Location: [facility] OR;  Service: General;  Laterality: Left;   BREAST LUMPECTOMY WITH RADIOACTIVE SEED LOCALIZATION Right 01/13/2019   Procedure: RIGHT BREAST LUMPECTOMY WITH RADIOACTIVE SEED LOCALIZATION X3;  Surgeon: Sim Dryer, MD;  Location: [facility];  Service: General;  Laterality: Right;   CESAREAN SECTION     2007- /w epidural  anesthesia    COLONOSCOPY     IRRIGATION AND DEBRIDEMENT ABSCESS Left 11/19/2022   Procedure: IRRIGATION AND DEBRIDEMENT LEFT AXILLA;  Surgeon: Lujean Sake, MD;  Location: [facility] OR;  Service: General;  Laterality: Left;   LUMBAR LAMINECTOMY/DECOMPRESSION MICRODISCECTOMY Right 12/15/2015   Procedure:  MICO LUMBER DECOMPRESSION L4-5 ON THE RIGHT, ;  Surgeon: Orvan Blanch, MD;  Location: [facility];  Service: Orthopedics;  Laterality: Right;   ORIF ANKLE FRACTURE Left 10/03/2017   Procedure: OPEN REDUCTION INTERNAL FIXATION (ORIF) ANKLE BIMALLEOLAR FRACTURE; possible deltoid ligament repair;  Surgeon: Amada Backer, MD;  Location: [facility];  Service: Orthopedics;  Laterality: Left;   SPINE SURGERY     fusion   UPPER GASTROINTESTINAL ENDOSCOPY     WISDOM TOOTH EXTRACTION     young adult   Social History   Tobacco Use   Smoking status: Former    Current packs/day: 0.00    Average packs/day: 0.3 packs/day for 15.0 years (3.8 ttl pk-yrs)    Types: Cigarettes    Start date: 04/01/2002    Quit date: 04/01/2017    Years since quitting: 6.7   Smokeless tobacco: Never   Tobacco comments:    vapes daily  Vaping Use   Vaping status: Every Day   Substances: Nicotine  Substance Use Topics   Alcohol use: Yes    Alcohol/week: 1.0 standard drink of alcohol     Types: 1 Glasses of wine per week    Comment: maybe 1 glass of wine per week, if that   Drug use: No   Social History   Socioeconomic History   Marital status: Married    Spouse name: Not on file   Number of children: 1   Years of education: Not on file   Highest education level: Not on file  Occupational History    Comment: working full time  Tobacco Use   Smoking status: Former    Current packs/day: 0.00    Average packs/day: 0.3 packs/day for 15.0 years (3.8 ttl pk-yrs)    Types: Cigarettes    Start date: 04/01/2002    Quit date: 04/01/2017    Years since quitting: 6.7   Smokeless tobacco: Never   Tobacco comments:    vapes daily  Vaping Use   Vaping status: Every Day   Substances: Nicotine  Substance and Sexual Activity   Alcohol use: Yes    Alcohol/week:  1.0 standard drink of alcohol    Types: 1 Glasses of wine per week    Comment: maybe 1 glass of wine per week, if that   Drug use: No   Sexual activity: Not on file  Other Topics Concern   Not on file  Social History Narrative   Not on file   Social Drivers of Health   Financial Resource Strain: Not on file  Food Insecurity: Not on file  Transportation Needs: Not on file  Physical Activity: Not on file  Stress: Not on file  Social Connections: Not on file  Intimate Partner Violence: Not on file   Family Status  Relation Name Status   Mother  Alive   Father  Alive   Mat Aunt  (Not Specified)   Neg Hx  (Not Specified)  No partnership data on file   Family History  Problem Relation Age of Onset   Lupus Mother    Breast cancer Maternal Aunt    Anesthesia problems Neg Hx    Colon cancer Neg Hx    Colon polyps Neg Hx    Esophageal cancer Neg Hx    Rectal cancer Neg Hx    Stomach cancer Neg Hx    Allergies  Allergen Reactions   Tamiflu [Oseltamivir Phosphate] Other (See Comments)    Seizure   Cipro [Ciprofloxacin Hcl] Diarrhea and Nausea And Vomiting   Reglan [Metoclopramide] Other (See  Comments)    "Lock jaw"   Sulfa Antibiotics Nausea And Vomiting      Review of Systems  Constitutional:  Negative for chills, fever and malaise/fatigue.  HENT:  Negative for congestion and hearing loss.   Eyes:  Negative for blurred vision and discharge.  Respiratory:  Negative for cough, sputum production and shortness of breath.   Cardiovascular:  Negative for chest pain, palpitations and leg swelling.  Gastrointestinal:  Negative for abdominal pain, blood in stool, constipation, diarrhea, heartburn, nausea and vomiting.  Genitourinary:  Negative for dysuria, frequency, hematuria and urgency.  Musculoskeletal:  Negative for back pain, falls and myalgias.  Skin:  Negative for rash.  Neurological:  Negative for dizziness, sensory change, loss of consciousness, weakness and headaches.  Endo/Heme/Allergies:  Negative for environmental allergies. Does not bruise/bleed easily.  Psychiatric/Behavioral:  Negative for depression and suicidal ideas. The patient is not nervous/anxious and does not have insomnia.       Objective:     BP 118/86 (BP Location: Right Arm, Patient Position: Sitting, Cuff Size: Normal)   Pulse 86   Temp 98.9 F (37.2 C) (Oral)   Resp 18   Ht 5\' 3"  (1.6 m)   Wt 143 lb 3.2 oz (65 kg)   SpO2 99%   BMI 25.37 kg/m  BP Readings from Last 3 Encounters:  01/02/24 118/86  12/13/23 (!) 127/94  11/13/23 (!) 151/97   Wt Readings from Last 3 Encounters:  01/02/24 143 lb 3.2 oz (65 kg)  11/13/23 146 lb 8 oz (66.5 kg)  08/22/23 150 lb 9.6 oz (68.3 kg)   SpO2 Readings from Last 3 Encounters:  01/02/24 99%  12/13/23 100%  11/13/23 98%      Physical Exam Vitals and nursing note reviewed.  Constitutional:      General: She is not in acute distress.    Appearance: Normal appearance. She is well-developed.  HENT:     Head: Normocephalic and atraumatic.  Eyes:     General: No scleral icterus.       Right eye: No discharge.  Left eye: No discharge.   Cardiovascular:     Rate and Rhythm: Normal rate and regular rhythm.     Heart sounds: No murmur heard. Pulmonary:     Effort: Pulmonary effort is normal. No respiratory distress.     Breath sounds: Normal breath sounds.  Musculoskeletal:        General: Normal range of motion.     Cervical back: Normal range of motion and neck supple.     Right lower leg: No edema.     Left lower leg: No edema.  Skin:    General: Skin is warm and dry.  Neurological:     Mental Status: She is alert and oriented to person, place, and time.  Psychiatric:        Mood and Affect: Mood normal.        Behavior: Behavior normal.        Thought Content: Thought content normal.        Judgment: Judgment normal.      No results found for any visits on 01/02/24.  Last CBC Lab Results  Component Value Date   WBC 3.9 (L) 02/19/2023   HGB 12.5 02/19/2023   HCT 37.9 02/19/2023   MCV 99.6 02/19/2023   MCH 41.8 (H) 11/20/2022   RDW 14.7 02/19/2023   PLT 148.0 (L) 02/19/2023   Last metabolic panel Lab Results  Component Value Date   GLUCOSE 125 (H) 02/19/2023   NA 137 02/19/2023   K 3.7 02/19/2023   CL 96 02/19/2023   CO2 27 02/19/2023   BUN 11 02/19/2023   CREATININE 0.53 02/19/2023   GFR 110.61 02/19/2023   CALCIUM 9.5 02/19/2023   PROT 8.2 02/19/2023   ALBUMIN  4.8 02/19/2023   BILITOT 0.6 02/19/2023   ALKPHOS 48 02/19/2023   AST 156 (H) 02/19/2023   ALT 50 (H) 02/19/2023   ANIONGAP 13 11/19/2022   Last lipids Lab Results  Component Value Date   CHOL 262 (H) 02/19/2023   HDL 116.30 02/19/2023   LDLCALC 129 (H) 02/19/2023   TRIG 85.0 02/19/2023   CHOLHDL 2 02/19/2023   Last hemoglobin A1c Lab Results  Component Value Date   HGBA1C 5.8 09/02/2017   Last thyroid functions Lab Results  Component Value Date   TSH 1.09 02/19/2023   Last vitamin D  Lab Results  Component Value Date   VD25OH 17.82 (L) 02/19/2023   Last vitamin B12 and Folate Lab Results  Component Value  Date   VITAMINB12 235 02/19/2023      The ASCVD Risk score (Arnett DK, et al., 2019) failed to calculate for the following reasons:   The valid HDL cholesterol range is 20 to 100 mg/dL    Assessment & Plan:   Problem List Items Addressed This Visit       Unprioritized   Elevated liver enzymes   Essential hypertension - Primary   Invasive ductal carcinoma of breast, female, left (HCC)  Assessment and Plan Assessment & Plan Current cancer treatment with Zoladex    She is undergoing Zoladex  injections for cancer, which induce menopausal symptoms. Bruising at the injection site varies with technique. She prefers a provider who administers injections without bruising, but this provider has relocated.  Menopausal symptoms due to cancer treatment   She experiences menopausal symptoms, including hot flashes, due to Zoladex  treatment, which may contribute to hypertension.  Hypertension   Home blood pressure readings are elevated, possibly due to incorrect cuff placement, while office readings are normal, nearly low. Stress  from job loss and menopausal symptoms may contribute to elevated readings. Ensure correct placement of the blood pressure cuff at home. Check blood pressure biweekly and send readings after several weeks. Schedule a nurse visit to compare the home blood pressure monitor with office readings.  Fatty liver   A previous ultrasound in March showed fatty liver. There are no current concerns or changes in management.  Goals of Care   She prefers prophylactic surgery for her children if cancer is detected, indicating a proactive approach to managing familial cancer risk.    Return in about 3 months (around 04/03/2024), or if symptoms worsen or fail to improve, for annual exam, fasting.    Clorissa Gruenberg R Lowne Chase, DO

## 2024-01-09 ENCOUNTER — Other Ambulatory Visit: Payer: Self-pay | Admitting: Family Medicine

## 2024-01-10 ENCOUNTER — Inpatient Hospital Stay: Attending: Hematology and Oncology

## 2024-01-10 VITALS — BP 133/97 | Temp 99.3°F | Resp 17

## 2024-01-10 DIAGNOSIS — C50312 Malignant neoplasm of lower-inner quadrant of left female breast: Secondary | ICD-10-CM | POA: Diagnosis present

## 2024-01-10 DIAGNOSIS — Z5111 Encounter for antineoplastic chemotherapy: Secondary | ICD-10-CM | POA: Insufficient documentation

## 2024-01-10 DIAGNOSIS — C50912 Malignant neoplasm of unspecified site of left female breast: Secondary | ICD-10-CM

## 2024-01-10 MED ORDER — GOSERELIN ACETATE 3.6 MG ~~LOC~~ IMPL
3.6000 mg | DRUG_IMPLANT | Freq: Once | SUBCUTANEOUS | Status: AC
  Administered 2024-01-10: 3.6 mg via SUBCUTANEOUS
  Filled 2024-01-10: qty 3.6

## 2024-01-13 ENCOUNTER — Encounter: Payer: Self-pay | Admitting: Family Medicine

## 2024-01-30 ENCOUNTER — Inpatient Hospital Stay: Admitting: Licensed Clinical Social Worker

## 2024-01-30 NOTE — Progress Notes (Signed)
 [facility] Clinical Social Work  Clinical Social Work was referred by self for counseling- pt left VM.  CSW returned call and pt is seeking counseling primarily related to job loss and resulting difficulties with mood as well as for secondary processing of cancer experience.  CSW provided information on services available through [facility] (brief counseling focused on adjustment with cancer) versus with therapists in the community for ongoing, broader support. Pt opted for community due to the nature of her concerns. CSW provided names of four therapists/practices in network with her insurance ([facility], [facility], [address], [place]).  Pt will contact them. She will call back if she needs additional information.    [name] E Shailyn Weyandt, LCSW  Clinical Social Worker [facility]        Patient is participating in a Managed Medicaid Plan:  Yes

## 2024-02-06 ENCOUNTER — Inpatient Hospital Stay: Attending: Hematology and Oncology

## 2024-02-06 VITALS — BP 138/102 | HR 97 | Temp 99.2°F | Resp 19

## 2024-02-06 DIAGNOSIS — C50912 Malignant neoplasm of unspecified site of left female breast: Secondary | ICD-10-CM

## 2024-02-06 DIAGNOSIS — C50312 Malignant neoplasm of lower-inner quadrant of left female breast: Secondary | ICD-10-CM | POA: Insufficient documentation

## 2024-02-06 DIAGNOSIS — Z5111 Encounter for antineoplastic chemotherapy: Secondary | ICD-10-CM | POA: Insufficient documentation

## 2024-02-06 MED ORDER — GOSERELIN ACETATE 3.6 MG ~~LOC~~ IMPL
3.6000 mg | DRUG_IMPLANT | Freq: Once | SUBCUTANEOUS | Status: AC
Start: 2024-02-06 — End: 2024-02-06
  Administered 2024-02-06: 3.6 mg via SUBCUTANEOUS
  Filled 2024-02-06: qty 3.6

## 2024-02-22 ENCOUNTER — Other Ambulatory Visit: Payer: Self-pay | Admitting: Gastroenterology

## 2024-02-22 DIAGNOSIS — K22719 Barrett's esophagus with dysplasia, unspecified: Secondary | ICD-10-CM

## 2024-02-22 DIAGNOSIS — K219 Gastro-esophageal reflux disease without esophagitis: Secondary | ICD-10-CM

## 2024-03-06 ENCOUNTER — Inpatient Hospital Stay: Attending: Hematology and Oncology

## 2024-03-06 VITALS — BP 136/95 | HR 88 | Temp 99.4°F | Resp 18

## 2024-03-06 DIAGNOSIS — Z5111 Encounter for antineoplastic chemotherapy: Secondary | ICD-10-CM | POA: Insufficient documentation

## 2024-03-06 DIAGNOSIS — C50312 Malignant neoplasm of lower-inner quadrant of left female breast: Secondary | ICD-10-CM | POA: Diagnosis present

## 2024-03-06 DIAGNOSIS — C50912 Malignant neoplasm of unspecified site of left female breast: Secondary | ICD-10-CM

## 2024-03-06 MED ORDER — GOSERELIN ACETATE 3.6 MG ~~LOC~~ IMPL
3.6000 mg | DRUG_IMPLANT | Freq: Once | SUBCUTANEOUS | Status: AC
  Administered 2024-03-06: 3.6 mg via SUBCUTANEOUS
  Filled 2024-03-06: qty 3.6

## 2024-03-10 ENCOUNTER — Telehealth: Payer: Self-pay | Admitting: Neurology

## 2024-03-10 ENCOUNTER — Other Ambulatory Visit: Payer: Self-pay | Admitting: Family Medicine

## 2024-03-10 ENCOUNTER — Ambulatory Visit: Admitting: Family Medicine

## 2024-03-10 DIAGNOSIS — K22719 Barrett's esophagus with dysplasia, unspecified: Secondary | ICD-10-CM

## 2024-03-10 DIAGNOSIS — K219 Gastro-esophageal reflux disease without esophagitis: Secondary | ICD-10-CM

## 2024-03-10 MED ORDER — OMEPRAZOLE 40 MG PO CPDR: 40.0000 mg | DELAYED_RELEASE_CAPSULE | Freq: Two times a day (BID) | ORAL | 3 refills | Status: DC

## 2024-03-10 NOTE — Telephone Encounter (Signed)
 Copied from CRM [id number]. Topic: General - Other >> Mar 10, 2024 12:26 PM Mesmerise C wrote: Reason for CRM: Patient checking why medication was denied advised need appointment before refilling patient needs to make an appointment with Gastroenterology office for refills of Omeprazole  please advised patient called to make aware no answer

## 2024-04-03 ENCOUNTER — Inpatient Hospital Stay

## 2024-04-03 VITALS — BP 108/82 | HR 84 | Temp 98.9°F | Resp 18

## 2024-04-03 DIAGNOSIS — Z5111 Encounter for antineoplastic chemotherapy: Secondary | ICD-10-CM | POA: Diagnosis not present

## 2024-04-03 DIAGNOSIS — C50912 Malignant neoplasm of unspecified site of left female breast: Secondary | ICD-10-CM

## 2024-04-03 MED ORDER — GOSERELIN ACETATE 3.6 MG ~~LOC~~ IMPL
3.6000 mg | DRUG_IMPLANT | Freq: Once | SUBCUTANEOUS | Status: AC
  Administered 2024-04-03: 3.6 mg via SUBCUTANEOUS
  Filled 2024-04-03: qty 3.6

## 2024-04-03 NOTE — Progress Notes (Signed)
 Zoladex  injection- left lower abdomen given per orders. Patient tolerated it well without problems. Vitals stable and discharged home from clinic ambulatory. Follow up as scheduled.

## 2024-04-03 NOTE — Patient Instructions (Signed)
 [facility] - A DEPT OF [facility]  Discharge Instructions: Thank you for choosing [facility] to provide your oncology and hematology care.   If you have a lab appointment with the Cancer Center, please go directly to the Cancer Center and check in at the registration area.   Wear comfortable clothing and clothing appropriate for easy access to any Portacath or PICC line.   We strive to give you quality time with your provider. You may need to reschedule your appointment if you arrive late (15 or more minutes).  Arriving late affects you and other patients whose appointments are after yours.  Also, if you miss three or more appointments without notifying the office, you may be dismissed from the clinic at the provider's discretion.      For prescription refill requests, have your pharmacy contact our office and allow 72 hours for refills to be completed.    Today you received the following injection: zoladex    To help prevent nausea and vomiting after your treatment, we encourage you to take your nausea medication as directed.  BELOW ARE SYMPTOMS THAT SHOULD BE REPORTED IMMEDIATELY: *FEVER GREATER THAN 100.4 F (38 C) OR HIGHER *CHILLS OR SWEATING *NAUSEA AND VOMITING THAT IS NOT CONTROLLED WITH YOUR NAUSEA MEDICATION *UNUSUAL SHORTNESS OF BREATH *UNUSUAL BRUISING OR BLEEDING *URINARY PROBLEMS (pain or burning when urinating, or frequent urination) *BOWEL PROBLEMS (unusual diarrhea, constipation, pain near the anus) TENDERNESS IN MOUTH AND THROAT WITH OR WITHOUT PRESENCE OF ULCERS (sore throat, sores in mouth, or a toothache) UNUSUAL RASH, SWELLING OR PAIN  UNUSUAL VAGINAL DISCHARGE OR ITCHING   Items with * indicate a potential emergency and should be followed up as soon as possible or go to the Emergency Department if any problems should occur.  Please show the CHEMOTHERAPY ALERT CARD or IMMUNOTHERAPY ALERT CARD at check-in to the  Emergency Department and triage nurse.  Should you have questions after your visit or need to cancel or reschedule your appointment, please contact [facility] - A DEPT OF [facility]  Dept: [phone]  and follow the prompts.  Office hours are 8:00 a.m. to 4:30 p.m. Monday - Friday. Please note that voicemails left after 4:00 p.m. may not be returned until the following business day.  We are closed weekends and major holidays. You have access to a nurse at all times for urgent questions. Please call the main number to the clinic Dept: [phone] and follow the prompts.   For any non-urgent questions, you may also contact your provider using MyChart. We now offer e-Visits for anyone 82 and older to request care online for non-urgent symptoms. For details visit [email or web address].   Also download the MyChart app! Go to the app store, search MyChart, open the app, select [facility], and log in with your MyChart username and password.

## 2024-04-06 ENCOUNTER — Other Ambulatory Visit: Payer: Self-pay | Admitting: Family Medicine

## 2024-04-22 ENCOUNTER — Encounter: Payer: Self-pay | Admitting: Emergency Medicine

## 2024-04-22 ENCOUNTER — Ambulatory Visit
Admission: EM | Admit: 2024-04-22 | Discharge: 2024-04-22 | Disposition: A | Discharge status: Home / Self Care | Attending: Nurse Practitioner | Admitting: Nurse Practitioner

## 2024-04-22 DIAGNOSIS — R1010 Upper abdominal pain, unspecified: Secondary | ICD-10-CM | POA: Diagnosis not present

## 2024-04-22 DIAGNOSIS — L719 Rosacea, unspecified: Secondary | ICD-10-CM | POA: Insufficient documentation

## 2024-04-22 DIAGNOSIS — R112 Nausea with vomiting, unspecified: Secondary | ICD-10-CM

## 2024-04-22 MED ORDER — ONDANSETRON 4 MG PO TBDP
4.0000 mg | ORAL_TABLET | Freq: Once | ORAL | Status: AC
  Administered 2024-04-22: 4 mg via ORAL

## 2024-04-22 MED ORDER — ONDANSETRON 8 MG PO TBDP: 8.0000 mg | ORAL_TABLET | Freq: Three times a day (TID) | ORAL | 0 refills | Status: AC | PRN

## 2024-04-22 NOTE — Discharge Instructions (Addendum)
 You were seen today for abdominal pain, nausea, and vomiting. Your exam and history suggest that this is most likely caused by a viral stomach illness, though other conditions can sometimes cause similar symptoms. To help your body recover, start with clear liquids such as water, broth, or electrolyte drinks for the next 24 hours. Avoid red-colored drinks, as they can make it harder to tell if blood is present in vomit. If you are able to keep liquids down, you may slowly move to bland foods such as bananas, rice, applesauce, and toast. Avoid dairy products, fried foods, greasy foods, and spicy foods until you are feeling better.  You were prescribed ondansetron  (Zofran ) to help control nausea and vomiting. Take one dose under the tongue every eight hours as needed. Continue to drink fluids often in small sips to stay hydrated. Rest as much as possible while your body heals.  Follow up with your primary care provider if your symptoms do not improve or if you continue to have trouble with nausea, constipation, or abdominal pain over the next few days. Go to the emergency room right away if you are unable to keep fluids down, if you develop a fever, if your abdominal pain suddenly worsens or becomes sharp in one area, or if you notice blood in your vomit or stool.

## 2024-04-22 NOTE — ED Triage Notes (Signed)
 Pt states the first time she threw up was Saturday. Got better temporally. Then she began throwing more Sunday to today. Last night had upper abdominal pain. States it feels like pressure in abdomin   Last BM has been a few days.
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
## 2024-04-23 ENCOUNTER — Inpatient Hospital Stay (HOSPITAL_COMMUNITY)
Admission: EM | Admit: 2024-04-23 | Discharge: 2024-04-30 | DRG: 439 | Disposition: A | Discharge status: Home / Self Care | Attending: Internal Medicine | Admitting: Internal Medicine

## 2024-04-23 ENCOUNTER — Other Ambulatory Visit: Payer: Self-pay

## 2024-04-23 ENCOUNTER — Inpatient Hospital Stay (HOSPITAL_COMMUNITY)

## 2024-04-23 ENCOUNTER — Encounter (HOSPITAL_COMMUNITY): Payer: Self-pay

## 2024-04-23 ENCOUNTER — Emergency Department (HOSPITAL_COMMUNITY)

## 2024-04-23 DIAGNOSIS — K859 Acute pancreatitis without necrosis or infection, unspecified: Secondary | ICD-10-CM | POA: Diagnosis not present

## 2024-04-23 DIAGNOSIS — R188 Other ascites: Secondary | ICD-10-CM | POA: Diagnosis not present

## 2024-04-23 DIAGNOSIS — Z888 Allergy status to other drugs, medicaments and biological substances status: Secondary | ICD-10-CM | POA: Diagnosis not present

## 2024-04-23 DIAGNOSIS — E8729 Other acidosis: Secondary | ICD-10-CM | POA: Diagnosis present

## 2024-04-23 DIAGNOSIS — Z882 Allergy status to sulfonamides status: Secondary | ICD-10-CM

## 2024-04-23 DIAGNOSIS — N179 Acute kidney failure, unspecified: Secondary | ICD-10-CM | POA: Diagnosis not present

## 2024-04-23 DIAGNOSIS — C50912 Malignant neoplasm of unspecified site of left female breast: Secondary | ICD-10-CM | POA: Diagnosis present

## 2024-04-23 DIAGNOSIS — E860 Dehydration: Secondary | ICD-10-CM | POA: Diagnosis present

## 2024-04-23 DIAGNOSIS — Z981 Arthrodesis status: Secondary | ICD-10-CM

## 2024-04-23 DIAGNOSIS — Z720 Tobacco use: Secondary | ICD-10-CM | POA: Diagnosis present

## 2024-04-23 DIAGNOSIS — Z881 Allergy status to other antibiotic agents status: Secondary | ICD-10-CM

## 2024-04-23 DIAGNOSIS — R739 Hyperglycemia, unspecified: Secondary | ICD-10-CM | POA: Diagnosis present

## 2024-04-23 DIAGNOSIS — Z79899 Other long term (current) drug therapy: Secondary | ICD-10-CM | POA: Diagnosis not present

## 2024-04-23 DIAGNOSIS — K219 Gastro-esophageal reflux disease without esophagitis: Secondary | ICD-10-CM | POA: Diagnosis present

## 2024-04-23 DIAGNOSIS — D72829 Elevated white blood cell count, unspecified: Secondary | ICD-10-CM | POA: Diagnosis not present

## 2024-04-23 DIAGNOSIS — K852 Alcohol induced acute pancreatitis without necrosis or infection: Principal | ICD-10-CM | POA: Diagnosis present

## 2024-04-23 DIAGNOSIS — K760 Fatty (change of) liver, not elsewhere classified: Secondary | ICD-10-CM | POA: Diagnosis present

## 2024-04-23 DIAGNOSIS — W19XXXA Unspecified fall, initial encounter: Secondary | ICD-10-CM | POA: Diagnosis not present

## 2024-04-23 DIAGNOSIS — I10 Essential (primary) hypertension: Secondary | ICD-10-CM | POA: Diagnosis not present

## 2024-04-23 DIAGNOSIS — Z79811 Long term (current) use of aromatase inhibitors: Secondary | ICD-10-CM | POA: Diagnosis not present

## 2024-04-23 DIAGNOSIS — Z803 Family history of malignant neoplasm of breast: Secondary | ICD-10-CM

## 2024-04-23 DIAGNOSIS — S0990XA Unspecified injury of head, initial encounter: Secondary | ICD-10-CM | POA: Diagnosis not present

## 2024-04-23 DIAGNOSIS — Z853 Personal history of malignant neoplasm of breast: Secondary | ICD-10-CM

## 2024-04-23 DIAGNOSIS — R160 Hepatomegaly, not elsewhere classified: Secondary | ICD-10-CM | POA: Diagnosis not present

## 2024-04-23 DIAGNOSIS — R7989 Other specified abnormal findings of blood chemistry: Secondary | ICD-10-CM | POA: Diagnosis present

## 2024-04-23 DIAGNOSIS — F1010 Alcohol abuse, uncomplicated: Secondary | ICD-10-CM | POA: Diagnosis present

## 2024-04-23 DIAGNOSIS — F17290 Nicotine dependence, other tobacco product, uncomplicated: Secondary | ICD-10-CM | POA: Diagnosis present

## 2024-04-23 DIAGNOSIS — E872 Acidosis, unspecified: Secondary | ICD-10-CM | POA: Diagnosis present

## 2024-04-23 DIAGNOSIS — Y92009 Unspecified place in unspecified non-institutional (private) residence as the place of occurrence of the external cause: Secondary | ICD-10-CM | POA: Diagnosis not present

## 2024-04-23 DIAGNOSIS — Z8269 Family history of other diseases of the musculoskeletal system and connective tissue: Secondary | ICD-10-CM

## 2024-04-23 DIAGNOSIS — D696 Thrombocytopenia, unspecified: Secondary | ICD-10-CM | POA: Diagnosis not present

## 2024-04-23 DIAGNOSIS — K449 Diaphragmatic hernia without obstruction or gangrene: Secondary | ICD-10-CM | POA: Diagnosis not present

## 2024-04-23 DIAGNOSIS — Z923 Personal history of irradiation: Secondary | ICD-10-CM

## 2024-04-23 DIAGNOSIS — K567 Ileus, unspecified: Secondary | ICD-10-CM | POA: Diagnosis present

## 2024-04-23 LAB — I-STAT CHEM 8, ED
BUN: 45 mg/dL — ABNORMAL HIGH (ref 6–20)
Calcium, Ion: 1.08 mmol/L — ABNORMAL LOW (ref 1.15–1.40)
Chloride: 100 mmol/L (ref 98–111)
Creatinine, Ser: 3.6 mg/dL — ABNORMAL HIGH (ref 0.44–1.00)
Glucose, Bld: 180 mg/dL — ABNORMAL HIGH (ref 70–99)
HCT: 41 % (ref 36.0–46.0)
Hemoglobin: 13.9 g/dL (ref 12.0–15.0)
Potassium: 4.4 mmol/L (ref 3.5–5.1)
Sodium: 132 mmol/L — ABNORMAL LOW (ref 135–145)
TCO2: 11 mmol/L — ABNORMAL LOW (ref 22–32)

## 2024-04-23 LAB — CBC
HCT: 37.9 % (ref 36.0–46.0)
Hemoglobin: 12.4 g/dL (ref 12.0–15.0)
MCH: 34.1 pg — ABNORMAL HIGH (ref 26.0–34.0)
MCHC: 32.7 g/dL (ref 30.0–36.0)
MCV: 104.1 fL — ABNORMAL HIGH (ref 80.0–100.0)
Platelets: 122 K/uL — ABNORMAL LOW (ref 150–400)
RBC: 3.64 MIL/uL — ABNORMAL LOW (ref 3.87–5.11)
RDW: 13.1 % (ref 11.5–15.5)
WBC: 12.7 K/uL — ABNORMAL HIGH (ref 4.0–10.5)
nRBC: 0 % (ref 0.0–0.2)

## 2024-04-23 LAB — COMPREHENSIVE METABOLIC PANEL WITH GFR
ALT: 38 U/L (ref 0–44)
AST: 96 U/L — ABNORMAL HIGH (ref 15–41)
Albumin: 4.8 g/dL (ref 3.5–5.0)
Alkaline Phosphatase: 66 U/L (ref 38–126)
Anion gap: 34 — ABNORMAL HIGH (ref 5–15)
BUN: 51 mg/dL — ABNORMAL HIGH (ref 6–20)
CO2: 9 mmol/L — ABNORMAL LOW (ref 22–32)
Calcium: 9.6 mg/dL (ref 8.9–10.3)
Chloride: 91 mmol/L — ABNORMAL LOW (ref 98–111)
Creatinine, Ser: 3.69 mg/dL — ABNORMAL HIGH (ref 0.44–1.00)
GFR, Estimated: 15 mL/min — ABNORMAL LOW (ref >60)
Glucose, Bld: 181 mg/dL — ABNORMAL HIGH (ref 70–99)
Potassium: 4.4 mmol/L (ref 3.5–5.1)
Sodium: 134 mmol/L — ABNORMAL LOW (ref 135–145)
Total Bilirubin: 3.6 mg/dL — ABNORMAL HIGH (ref 0.0–1.2)
Total Protein: 9.2 g/dL — ABNORMAL HIGH (ref 6.5–8.1)

## 2024-04-23 LAB — ETHANOL: Alcohol, Ethyl (B): <15 mg/dL (ref <15)

## 2024-04-23 LAB — URINALYSIS, ROUTINE W REFLEX MICROSCOPIC
Bilirubin Urine: NEGATIVE
Glucose, UA: 150 mg/dL — AB
Ketones, ur: 80 mg/dL — AB
Leukocytes,Ua: NEGATIVE
Nitrite: NEGATIVE
Protein, ur: >=300 mg/dL — AB
Specific Gravity, Urine: 1.013 (ref 1.005–1.030)
pH: 5 (ref 5.0–8.0)

## 2024-04-23 LAB — PHOSPHORUS: Phosphorus: 2.6 mg/dL (ref 2.5–4.6)

## 2024-04-23 LAB — I-STAT CG4 LACTIC ACID, ED: Lactic Acid, Venous: 2 mmol/L (ref 0.5–1.9)

## 2024-04-23 LAB — HIV ANTIBODY (ROUTINE TESTING W REFLEX): HIV Screen 4th Generation wRfx: NONREACTIVE

## 2024-04-23 LAB — HEMOGLOBIN A1C
Hgb A1c MFr Bld: 5.4 % (ref 4.8–5.6)
Mean Plasma Glucose: 108.28 mg/dL

## 2024-04-23 LAB — MAGNESIUM: Magnesium: 1.6 mg/dL — ABNORMAL LOW (ref 1.7–2.4)

## 2024-04-23 LAB — LIPASE, BLOOD: Lipase: >2800 U/L — ABNORMAL HIGH (ref 11–51)

## 2024-04-23 LAB — GLUCOSE, CAPILLARY: Glucose-Capillary: 222 mg/dL — ABNORMAL HIGH (ref 70–99)

## 2024-04-23 LAB — CBG MONITORING, ED: Glucose-Capillary: 178 mg/dL — ABNORMAL HIGH (ref 70–99)

## 2024-04-23 MED ORDER — SODIUM CHLORIDE 0.9% FLUSH
3.0000 mL | Freq: Two times a day (BID) | INTRAVENOUS | Status: DC
  Administered 2024-04-24 – 2024-04-27 (×6): 3 mL via INTRAVENOUS
  Administered 2024-04-27: 20 mL via INTRAVENOUS
  Administered 2024-04-28: 3 mL via INTRAVENOUS
  Administered 2024-04-28: 10 mL via INTRAVENOUS
  Administered 2024-04-29 (×2): 3 mL via INTRAVENOUS

## 2024-04-23 MED ORDER — DEXTROSE-SODIUM CHLORIDE 5-0.45 % IV SOLN: INTRAVENOUS | Status: AC

## 2024-04-23 MED ORDER — INSULIN ASPART 100 UNIT/ML IJ SOLN: 0.0000 [IU] | Freq: Three times a day (TID) | INTRAMUSCULAR | Status: DC

## 2024-04-23 MED ORDER — ENOXAPARIN SODIUM 30 MG/0.3ML IJ SOSY
30.0000 mg | PREFILLED_SYRINGE | INTRAMUSCULAR | Status: DC
  Administered 2024-04-24: 30 mg via SUBCUTANEOUS
  Filled 2024-04-23: qty 0.3

## 2024-04-23 MED ORDER — LORAZEPAM 2 MG/ML IJ SOLN
1.0000 mg | INTRAMUSCULAR | Status: AC | PRN
  Filled 2024-04-23: qty 1

## 2024-04-23 MED ORDER — MORPHINE SULFATE (PF) 2 MG/ML IV SOLN: 2.0000 mg | INTRAVENOUS | Status: DC | PRN

## 2024-04-23 MED ORDER — LORAZEPAM 1 MG PO TABS
1.0000 mg | ORAL_TABLET | ORAL | Status: AC | PRN
  Administered 2024-04-23 – 2024-04-24 (×4): 1 mg via ORAL
  Filled 2024-04-23 (×4): qty 1

## 2024-04-23 MED ORDER — PANTOPRAZOLE SODIUM 40 MG IV SOLR
40.0000 mg | Freq: Two times a day (BID) | INTRAVENOUS | Status: DC
  Administered 2024-04-23 – 2024-04-29 (×12): 40 mg via INTRAVENOUS
  Filled 2024-04-23 (×12): qty 10

## 2024-04-23 MED ORDER — PANTOPRAZOLE SODIUM 40 MG IV SOLR
40.0000 mg | INTRAVENOUS | Status: DC
Start: 2024-04-23 — End: 2024-04-23

## 2024-04-23 MED ORDER — THIAMINE HCL 100 MG/ML IJ SOLN
100.0000 mg | Freq: Every day | INTRAMUSCULAR | Status: DC
  Administered 2024-04-23: 100 mg via INTRAVENOUS
  Filled 2024-04-23: qty 2

## 2024-04-23 MED ORDER — ONDANSETRON HCL 4 MG/2ML IJ SOLN
4.0000 mg | Freq: Once | INTRAMUSCULAR | Status: AC
  Administered 2024-04-23: 4 mg via INTRAVENOUS
  Filled 2024-04-23: qty 2

## 2024-04-23 MED ORDER — INSULIN ASPART 100 UNIT/ML IJ SOLN
0.0000 [IU] | Freq: Three times a day (TID) | INTRAMUSCULAR | Status: DC
  Administered 2024-04-24: 1 [IU] via SUBCUTANEOUS
  Administered 2024-04-24: 3 [IU] via SUBCUTANEOUS
  Administered 2024-04-24: 1 [IU] via SUBCUTANEOUS
  Administered 2024-04-25: 2 [IU] via SUBCUTANEOUS

## 2024-04-23 MED ORDER — ALBUTEROL SULFATE (2.5 MG/3ML) 0.083% IN NEBU: 2.5000 mg | INHALATION_SOLUTION | Freq: Four times a day (QID) | RESPIRATORY_TRACT | Status: DC | PRN

## 2024-04-23 MED ORDER — LACTATED RINGERS IV BOLUS
1000.0000 mL | Freq: Once | INTRAVENOUS | Status: AC
  Administered 2024-04-23: 1000 mL via INTRAVENOUS

## 2024-04-23 MED ORDER — ONDANSETRON HCL 4 MG/2ML IJ SOLN
4.0000 mg | Freq: Four times a day (QID) | INTRAMUSCULAR | Status: DC | PRN
  Filled 2024-04-23: qty 2

## 2024-04-23 MED ORDER — THIAMINE MONONITRATE 100 MG PO TABS
100.0000 mg | ORAL_TABLET | Freq: Every day | ORAL | Status: DC
  Administered 2024-04-23 – 2024-04-29 (×7): 100 mg via ORAL
  Filled 2024-04-23 (×7): qty 1

## 2024-04-23 MED ORDER — FENTANYL CITRATE PF 50 MCG/ML IJ SOSY
50.0000 ug | PREFILLED_SYRINGE | Freq: Once | INTRAMUSCULAR | Status: AC
  Administered 2024-04-23: 50 ug via INTRAVENOUS
  Filled 2024-04-23: qty 1

## 2024-04-23 MED ORDER — LORAZEPAM 2 MG/ML IJ SOLN
0.0000 mg | Freq: Four times a day (QID) | INTRAMUSCULAR | Status: AC
  Administered 2024-04-24: 1 mg via INTRAVENOUS

## 2024-04-23 MED ORDER — ANASTROZOLE 1 MG PO TABS
1.0000 mg | ORAL_TABLET | Freq: Every day | ORAL | Status: DC
  Administered 2024-04-23 – 2024-04-29 (×7): 1 mg via ORAL
  Filled 2024-04-23 (×8): qty 1

## 2024-04-23 MED ORDER — OXYCODONE HCL 5 MG PO TABS
5.0000 mg | ORAL_TABLET | ORAL | Status: DC | PRN
  Administered 2024-04-23 – 2024-04-28 (×12): 5 mg via ORAL
  Filled 2024-04-23 (×12): qty 1

## 2024-04-23 MED ORDER — INSULIN ASPART 100 UNIT/ML IJ SOLN: 0.0000 [IU] | Freq: Every day | INTRAMUSCULAR | Status: DC

## 2024-04-23 MED ORDER — ADULT MULTIVITAMIN W/MINERALS CH
1.0000 | ORAL_TABLET | Freq: Every day | ORAL | Status: DC
  Administered 2024-04-23 – 2024-04-27 (×5): 1 via ORAL
  Filled 2024-04-23 (×7): qty 1

## 2024-04-23 MED ORDER — FOLIC ACID 1 MG PO TABS
1.0000 mg | ORAL_TABLET | Freq: Every day | ORAL | Status: DC
  Administered 2024-04-23 – 2024-04-29 (×7): 1 mg via ORAL
  Filled 2024-04-23 (×7): qty 1

## 2024-04-23 MED ORDER — ONDANSETRON HCL 4 MG PO TABS
4.0000 mg | ORAL_TABLET | Freq: Four times a day (QID) | ORAL | Status: DC | PRN
  Administered 2024-04-24 – 2024-04-25 (×3): 4 mg via ORAL
  Filled 2024-04-23 (×3): qty 1

## 2024-04-23 MED ORDER — LORAZEPAM 2 MG/ML IJ SOLN: 0.0000 mg | Freq: Two times a day (BID) | INTRAMUSCULAR | Status: AC

## 2024-04-23 NOTE — ED Provider Notes (Signed)
 [place] EMERGENCY DEPARTMENT AT [facility] Provider Note   CSN: [id number] Arrival date & time: 04/23/24  1042     Patient presents with: Abdominal Pain, Emesis, and Nausea   [name] is a 48 y.o. female.   Patient is a 47 year old female who presents emergency department the chief complaint of generalized abdominal pain, nausea, vomiting which has been ongoing approximate the past 3 days.  Patient denies any associated diarrhea.  She does note that she has had decreased urine output.  She does note that she drinks alcohol on a regular basis.  She notes that she has had no melena, hematochezia, hematemesis.  There has been no associated chest pain, shortness of breath, dizziness, lightheadedness, syncope.  Patient does present with bruising to her face and she notes that she had a fall last week.   Abdominal Pain Associated symptoms: vomiting   Emesis Associated symptoms: abdominal pain        Prior to Admission medications   Medication Sig Start Date End Date Taking? Authorizing Provider  acetaminophen  (TYLENOL ) 500 MG tablet Take 1,000 mg by mouth every 6 (six) hours as needed for moderate pain.    [provider]  anastrozole  (ARIMIDEX ) 1 MG tablet TAKE 1 TABLET BY MOUTH EVERY DAY 04/07/24   Antonio Meth, Yvonne R, DO  ELDERBERRY PO Take 1 tablet by mouth 2 (two) times a week.    [provider]  goserelin (ZOLADEX ) 3.6 MG injection Inject 3.6 mg into the skin every 28 (twenty-eight) days.    [provider]  Ivermectin (SOOLANTRA) 1 % CREA 1 application to affected area Externally Once a day; Duration: 30 days 10/25/15   [provider]  levonorgestrel  (MIRENA ) 20 MCG/DAY IUD 1 each by Intrauterine route once.    [provider]  losartan  (COZAAR ) 25 MG tablet TAKE 1 TABLET (25 MG TOTAL) BY MOUTH DAILY. 04/07/24   Antonio Meth, Yvonne R, DO  methylphenidate  (RITALIN  LA) 40 MG 24 hr capsule Take 40 mg by mouth every  morning.    [provider]  metoprolol  succinate (TOPROL -XL) 50 MG 24 hr tablet Take 1 tablet (50 mg total) by mouth daily. Take with or immediately following a meal. 02/19/23   Antonio Meth, Jamee SAUNDERS, DO  omeprazole  (PRILOSEC) 40 MG capsule Take 1 capsule (40 mg total) by mouth 2 (two) times daily. 03/10/24   Antonio Meth Jamee SAUNDERS, DO  ondansetron  (ZOFRAN -ODT) 8 MG disintegrating tablet Take 1 tablet (8 mg total) by mouth every 8 (eight) hours as needed for nausea or vomiting. 04/22/24   Iola Lukes, FNP  SUNSCREENS EX apply topically to face and body daily; Duration: 30 days 10/25/15   [provider]  valACYclovir  (VALTREX ) 1000 MG tablet Take 1 tablet (1,000 mg total) by mouth 3 (three) times daily as needed. For flare ups 06/29/20   Antonio Meth, Jamee R, DO  Vitamin D , Ergocalciferol , (DRISDOL ) 1.25 MG (50000 UNIT) CAPS capsule Take 1 capsule (50,000 Units total) by mouth every 7 (seven) days. 02/22/23   Antonio Meth Jamee SAUNDERS, DO  Norgestim-Eth Estrad Triphasic (ORTHO TRI-CYCLEN, 28, PO) Take 0.035 mg by mouth daily.   10/02/17  [provider]    Allergies: Tamiflu [oseltamivir phosphate], Cipro [ciprofloxacin hcl], Ciprofloxacin, Metoclopramide, and Sulfa antibiotics    Review of Systems  Gastrointestinal:  Positive for abdominal pain and vomiting.    Updated Vital Signs BP 100/63 (BP Location: Right Arm)   Pulse 96   Temp (!) 97.5  F (36.4 C)   Resp 18   Ht 5' 3 (1.6 m)   Wt 61.7 kg   LMP 02/20/2013   SpO2 100%   BMI 24.09 kg/m   Physical Exam Vitals and nursing note reviewed.  Constitutional:      General: She is not in acute distress.    Appearance: Normal appearance. She is not ill-appearing.  HENT:     Head: Normocephalic and atraumatic.     Nose: Nose normal.     Mouth/Throat:     Mouth: Mucous membranes are moist.  Eyes:     General: No scleral icterus.    Extraocular Movements: Extraocular movements intact.     Conjunctiva/sclera:  Conjunctivae normal.     Pupils: Pupils are equal, round, and reactive to light.  Cardiovascular:     Rate and Rhythm: Normal rate and regular rhythm.     Pulses: Normal pulses.     Heart sounds: Normal heart sounds. No murmur heard.    No gallop.  Pulmonary:     Effort: Pulmonary effort is normal. No respiratory distress.     Breath sounds: Normal breath sounds. No stridor. No wheezing, rhonchi or rales.  Abdominal:     General: Abdomen is flat. Bowel sounds are normal. There is no distension. There are no signs of injury.     Palpations: Abdomen is soft.     Tenderness: There is generalized abdominal tenderness.     Hernia: No hernia is present.  Musculoskeletal:        General: Normal range of motion.     Cervical back: Normal range of motion and neck supple.  Skin:    General: Skin is warm and dry.     Findings: No erythema or rash.     Comments: Bruising noted to the face  Neurological:     General: No focal deficit present.     Mental Status: She is alert and oriented to person, place, and time. Mental status is at baseline.  Psychiatric:        Mood and Affect: Mood normal.        Behavior: Behavior normal.        Thought Content: Thought content normal.        Judgment: Judgment normal.     (all labs ordered are listed, but only abnormal results are displayed) Labs Reviewed  COMPREHENSIVE METABOLIC PANEL WITH GFR - Abnormal; Notable for the following components:      Result Value   Sodium 134 (*)    Chloride 91 (*)    CO2 9 (*)    Glucose, Bld 181 (*)    BUN 51 (*)    Creatinine, Ser 3.69 (*)    Total Protein 9.2 (*)    AST 96 (*)    Total Bilirubin 3.6 (*)    GFR, Estimated 15 (*)    Anion gap 34 (*)    All other components within normal limits  CBC - Abnormal; Notable for the following components:   WBC 12.7 (*)    RBC 3.64 (*)    MCV 104.1 (*)    MCH 34.1 (*)    Platelets 122 (*)    All other components within normal limits  I-STAT CHEM 8, ED -  Abnormal; Notable for the following components:   Sodium 132 (*)    BUN 45 (*)    Creatinine, Ser 3.60 (*)    Glucose, Bld 180 (*)    Calcium, Ion 1.08 (*)  TCO2 11 (*)    All other components within normal limits  CBG MONITORING, ED - Abnormal; Notable for the following components:   Glucose-Capillary 178 (*)    All other components within normal limits  CULTURE, BLOOD (ROUTINE X 2)  CULTURE, BLOOD (ROUTINE X 2)  LIPASE, BLOOD  URINALYSIS, ROUTINE W REFLEX MICROSCOPIC  I-STAT CG4 LACTIC ACID, ED    EKG: None  Radiology: CT ABDOMEN PELVIS WO CONTRAST Result Date: 04/23/2024 CLINICAL DATA:  Abdomen pain. EXAM: CT ABDOMEN AND PELVIS WITHOUT CONTRAST TECHNIQUE: Multidetector CT imaging of the abdomen and pelvis was performed following the standard protocol without IV contrast. RADIATION DOSE REDUCTION: This exam was performed according to the departmental dose-optimization program which includes automated exposure control, adjustment of the mA and/or kV according to patient size and/or use of iterative reconstruction technique. COMPARISON:  CT abdomen pelvis dated 03/22/2017. FINDINGS: Evaluation of this exam is limited in the absence of intravenous contrast. Lower chest: The visualized lung bases are clear. No intra-abdominal free air. Small amount of inflammatory fluid in the upper abdomen and extending along the left paracolic gutter. Hepatobiliary: Severe fatty liver. The liver is enlarged measuring 20 cm in midclavicular length. Findings likely represent steatohepatitis. Correlation with LFTs recommended. High attenuating content within the gallbladder may represent sludge. No calcified gallstone. Pancreas: There is inflammatory changes of the pancreas with infiltration of the peripancreatic fat consistent with acute pancreatitis. Small amount of inflammatory fluid extends down along the left paracolic gutter which demonstrates a slightly higher attenuation and may represent a  complex/hemorrhagic fluid. Clinical correlation recommended to evaluate for possibility of hemorrhagic pancreatitis. Spleen: Normal in size without focal abnormality. Adrenals/Urinary Tract: The adrenal glands unremarkable. The kidneys, and the visualized ureters appear unremarkable. The urinary bladder is collapsed. Stomach/Bowel: Small hiatal hernia. Fluid within the distal esophagus likely represents reflux. There is no bowel obstruction or active inflammation. The appendix is normal. Vascular/Lymphatic: Mild aortoiliac atherosclerotic disease. The IVC is unremarkable. No portal venous gas. There is no adenopathy. Reproductive: The uterus is grossly unremarkable. No suspicious adnexal masses. Other: None Musculoskeletal: Osteopenia with degenerative changes spine. L5-S1 anterior fusion. No acute osseous pathology. IMPRESSION: 1. Acute pancreatitis.  No abscess or pseudocyst. 2. Enlarged fatty liver suggestive of steatohepatitis. Correlation with LFTs recommended. 3. No bowel obstruction. Normal appendix. 4.  Aortic Atherosclerosis (ICD10-I70.0). Electronically Signed   By: Vanetta Chou M.D.   On: 04/23/2024 14:47     .Critical Care  Performed by: Daralene Lonni BIRCH, PA-C Authorized by: Daralene Lonni BIRCH, PA-C   Critical care provider statement:    Critical care time (minutes):  35   Critical care was necessary to treat or prevent imminent or life-threatening deterioration of the following conditions:  Renal failure   Critical care was time spent personally by me on the following activities:  Development of treatment plan with patient or surrogate, discussions with consultants, evaluation of patient's response to treatment, examination of patient, ordering and review of laboratory studies, ordering and review of radiographic studies, ordering and performing treatments and interventions, pulse oximetry, re-evaluation of patient's condition and review of old charts   I assumed direction of  critical care for this patient from another provider in my specialty: no     Care discussed with: admitting provider      Medications Ordered in the ED  lactated ringers  bolus 1,000 mL (has no administration in time range)  lactated ringers  bolus 1,000 mL (has no administration in time range)  Medical Decision Making Amount and/or Complexity of Data Reviewed Labs: ordered.  Risk Prescription drug management. Decision regarding hospitalization.   This patient presents to the ED for concern of abdominal pain, this involves an extensive number of treatment options, and is a complaint that carries with it a high risk of complications and morbidity.  The differential diagnosis includes acute appendicitis, cholecystitis, small torsion, diverticulitis, ovarian torsion or cyst, PID, tubo-ovarian abscess, pyelonephritis, kidney stone, pancreatitis, mesenteric ischemia   Co morbidities that complicate the patient evaluation  Alcohol use   Additional history obtained:  Additional history obtained from family External records from outside source obtained and reviewed including medical records   Lab Tests:  I Ordered, and personally interpreted labs.  The pertinent results include: Leukos ptosis, no anemia, hyponatremia, elevated creatinine, elevated glucose, elevated total bilirubin, elevated lactic acid, lipase pending   Imaging Studies ordered:  I ordered imaging studies including CT scan abdomen pelvis I independently visualized and interpreted imaging which showed acute pancreatitis and hepatic steatosis I agree with the radiologist interpretation    Consultations Obtained:  I requested consultation with the hospitalist,  and discussed lab and imaging findings as well as pertinent plan - they recommend: Admission   Problem List / ED Course / Critical interventions / Medication management  Patient does remain stable at this time.   Discussed with patient we will plan for admission to the hospital service given her pancreatitis.  Do suspect that this is alcohol induced at this point.  Patient does use alcohol on a regular basis.  Patient has no indication for withdrawal at this time.  Lipase is still pending at this point but CT scan of abdomen and pelvis is consistent with pancreatitis.  She does have acute renal failure which do suspect that this is driven from her dehydration.  She also has associated acidosis which I do suspect is secondary to her dehydration.  IV fluids have been provided as well as pain control.  No other acute surgical process was noted on CT scan of the abdomen and pelvis.  Have discussed patient case with Dr. Claudene with the hospitalist service who has excepted for admission. I ordered medication including IV fluids, fentanyl , Zofran  for acute pancreatitis, dehydration Reevaluation of the patient after these medicines showed that the patient improved I have reviewed the patients home medicines and have made adjustments as needed   Social Determinants of Health:  None   Test / Admission - Considered:  In patient     Final diagnoses:  None    ED Discharge Orders     None          Daralene Lonni JONETTA DEVONNA 04/23/24 1604    Long, Fonda MATSU, MD 04/23/24 1941

## 2024-04-23 NOTE — ED Provider Triage Note (Signed)
 Emergency Medicine Provider Triage Evaluation Note  [name] , a 48 y.o. female  was evaluated in triage.  Pt complains of diffuse abdominal pain and vomiting x 3 days..  Review of Systems  Positive: Active vomiting in triage.  Diffuse abdominal pain to palpation in all 4 quadrants.   Negative: Chest pain, shortness of breath, dizziness, headache, focal weaknesses  Physical Exam  BP 100/63 (BP Location: Right Arm)   Pulse 96   Temp (!) 97.5 F (36.4 C)   Resp 18   Ht 5' 3 (1.6 m)   Wt 61.7 kg   LMP 02/20/2013   SpO2 100%   BMI 24.09 kg/m  Gen:   Awake, no distress. Resp:  Normal effort and clear to auscultation all fields MSK:   Moves extremities without difficulty  Other:  Active vomiting in triage.  Diffuse abdominal pain to palpation in all fields.  Medical Decision Making  Medically screening exam initiated at 12:29 PM.  Appropriate orders placed.  [name] was informed that the remainder of the evaluation will be completed by another provider, this initial triage assessment does not replace that evaluation, and the importance of remaining in the ED until their evaluation is complete.  Patient has had abdominal pain and vomiting since Tuesday worsening in severity.  Patient last meal was Tuesday.  Patient has not been able to keep down fluids or solids.  Patient does not have any trouble swallowing just nausea and vomiting.  Patient reports vomiting every 3-4 hours.  Patient reports she has urinated yesterday but no urination today.  Patient has had no relief with Zofran  given at urgent care.    Myriam Fonda RAMAN, PA-C 04/23/24 1235

## 2024-04-23 NOTE — ED Notes (Signed)
 [facility] called.

## 2024-04-23 NOTE — H&P (Addendum)
 History and Physical    Patient: [name] FMW:[id number] DOB: 1976-05-20 DOA: 04/23/2024 DOS: the patient was seen and examined on 04/23/2024 PCP: Antonio Cyndee [name], DO  Patient coming from: Home  Chief Complaint:  Chief Complaint  Patient presents with   Abdominal Pain   Emesis   Nausea   HPI: [name] is a 48 y.o. female with medical history significant of hypertension, history of breast cancer, alcohol abuse, and GERD presents with abdominal pain and severe vomiting. She is accompanied by her husband, BJ.  She has been experiencing abdominal pain and severe vomiting, with the worst symptoms occurring on Tuesday. The pain started in the upper abdomen and has not radiated to the back or shoulder blades. She has been unable to sleep for two nights due to the pain.  Her vomiting initially consisted of regular liquid but has since turned to green stomach bile. Earlier today, she vomited a very dark substance resembling coffee grounds, which then turned green.  She has a history of alcohol use, consuming four to five mixed drinks of vodka per day, with her last drink being the night before the visit. She has not experienced seizures related to alcohol but has had seizures in the past related to allergies.  She fell last week but did not seek medical attention at that time. The fall was not witnessed, but her husband found her after the incident. She feels weak from lack of sleep.  She vapes nicotine but does not smoke traditional cigarettes.  The emergency department patient was noted to be afebrile with stable vital signs.  Labs noted WBC 12.7, platelets 122, sodium 134, potassium 4.4, CO2 9, BUN 51, creatinine 3.69, and gap 34, AST 96, total bilirubin 3.6, and lactic acid 2.  CT scan of the abdomen pelvis revealed acute pancreatitis with no abscess or pseudocyst and enlarged fatty liver suggestive of steatohepatitis. Review of Systems: As mentioned in the  history of present illness. All other systems reviewed and are negative. Past Medical History:  Diagnosis Date   Anemia    Back pain    Benign neoplasm of skin 11/19/2008   Qualifier: Diagnosis of   By: Antonio ROSALEA Jamee      IMO SNOMED Dx Update Oct 2024     Bimalleolar ankle fracture    left   Breast cancer [facility])    right breast- 2020   Breast cancer (HCC) 09/2022   left breast   Breast cancer (HCC) 10/2022   Left   Constipation 01/16/2011   IMO SNOMED Dx Update Oct 2024     Family history of breast cancer    Fever, unspecified 03/23/2019   GERD 09/18/2006   Qualifier: Diagnosis of   By: Nicholaus Channel         GERD (gastroesophageal reflux disease)    Hypertension    no meds   Kidney infection    1998- hosp.[facility]   Personal history of radiation therapy    Rectal bleed 05/20/2017   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, more than 10 years ago- no instances since   WRIST PAIN, LEFT 10/17/2010   Qualifier: Diagnosis of   By: Antonio ROSALEA Jamee         Past Surgical History:  Procedure Laterality Date   ANTERIOR LUMBAR FUSION  11/14/2011   Procedure: ANTERIOR LUMBAR FUSION 2 LEVELS;  Surgeon: Reyes JAYSON Billing, MD;  Location: [facility] OR;  Service: Orthopedics;  Laterality: N/A;  ALIF L5-S1  BACK SURGERY     BREAST BIOPSY  11/13/2022   MM LT RADIOACTIVE SEED LOC MAMMO GUIDE 11/13/2022 [facility] MAMMOGRAPHY   BREAST LUMPECTOMY Right 01/2019   BREAST LUMPECTOMY WITH RADIOACTIVE SEED AND SENTINEL LYMPH NODE BIOPSY Left 11/15/2022   Procedure: LEFT BREAST LUMPECTOMY WITH RADIOACTIVE SEED AND SENTINEL LYMPH NODE BIOPSY;  Surgeon: Vanderbilt Ned, MD;  Location: [facility] OR;  Service: General;  Laterality: Left;   BREAST LUMPECTOMY WITH RADIOACTIVE SEED LOCALIZATION Right 01/13/2019   Procedure: RIGHT BREAST LUMPECTOMY WITH RADIOACTIVE SEED LOCALIZATION X3;  Surgeon: Vanderbilt Ned, MD;  Location: [facility];  Service: General;  Laterality: Right;   CESAREAN SECTION     2007- /w epidural   anesthesia    COLONOSCOPY     IRRIGATION AND DEBRIDEMENT ABSCESS Left 11/19/2022   Procedure: IRRIGATION AND DEBRIDEMENT LEFT AXILLA;  Surgeon: Dasie Leonor CROME, MD;  Location: [facility] OR;  Service: General;  Laterality: Left;   LUMBAR LAMINECTOMY/DECOMPRESSION MICRODISCECTOMY Right 12/15/2015   Procedure:  MICO LUMBER DECOMPRESSION L4-5 ON THE RIGHT, ;  Surgeon: Reyes Billing, MD;  Location: [facility] ORS;  Service: Orthopedics;  Laterality: Right;   ORIF ANKLE FRACTURE Left 10/03/2017   Procedure: OPEN REDUCTION INTERNAL FIXATION (ORIF) ANKLE BIMALLEOLAR FRACTURE; possible deltoid ligament repair;  Surgeon: Kit Rush, MD;  Location: [facility];  Service: Orthopedics;  Laterality: Left;   SPINE SURGERY     fusion   UPPER GASTROINTESTINAL ENDOSCOPY     WISDOM TOOTH EXTRACTION     young adult   Social History:  reports that she quit smoking about 7 years ago. Her smoking use included cigarettes. She started smoking about 22 years ago. She has a 3.8 pack-year smoking history. She has never used smokeless tobacco. She reports current alcohol use of about 1.0 standard drink of alcohol per week. She reports that she does not use drugs.  Allergies  Allergen Reactions   Tamiflu [Oseltamivir Phosphate] Other (See Comments)    Seizure   Cipro [Ciprofloxacin Hcl] Diarrhea and Nausea And Vomiting   Ciprofloxacin Diarrhea and Nausea And Vomiting    ciprofloxacin   Metoclopramide Other (See Comments)    Lock jaw  Reglan   Sulfa Antibiotics Nausea And Vomiting    Family History  Problem Relation Age of Onset   Lupus Mother    Breast cancer Maternal Aunt    Anesthesia problems Neg Hx    Colon cancer Neg Hx    Colon polyps Neg Hx    Esophageal cancer Neg Hx    Rectal cancer Neg Hx    Stomach cancer Neg Hx     Prior to Admission medications   Medication Sig Start Date End Date Taking? Authorizing Provider  acetaminophen  (TYLENOL ) 500 MG tablet Take 1,000 mg by mouth every 6 (six) hours  as needed for moderate pain.    [provider]  anastrozole  (ARIMIDEX ) 1 MG tablet TAKE 1 TABLET BY MOUTH EVERY DAY 04/07/24   Antonio Meth, Yvonne R, DO  ELDERBERRY PO Take 1 tablet by mouth 2 (two) times a week.    [provider]  goserelin (ZOLADEX ) 3.6 MG injection Inject 3.6 mg into the skin every 28 (twenty-eight) days.    [provider]  Ivermectin (SOOLANTRA) 1 % CREA 1 application to affected area Externally Once a day; Duration: 30 days 10/25/15   [provider]  levonorgestrel  (MIRENA ) 20 MCG/DAY IUD 1 each by Intrauterine route once.    [provider]  losartan  (COZAAR ) 25 MG  tablet TAKE 1 TABLET (25 MG TOTAL) BY MOUTH DAILY. 04/07/24   Antonio Meth, Jamee SAUNDERS, DO  methylphenidate  (RITALIN  LA) 40 MG 24 hr capsule Take 40 mg by mouth every morning.    [provider]  metoprolol  succinate (TOPROL -XL) 50 MG 24 hr tablet Take 1 tablet (50 mg total) by mouth daily. Take with or immediately following a meal. 02/19/23   Antonio Meth, Jamee SAUNDERS, DO  omeprazole  (PRILOSEC) 40 MG capsule Take 1 capsule (40 mg total) by mouth 2 (two) times daily. 03/10/24   Antonio Meth Jamee SAUNDERS, DO  ondansetron  (ZOFRAN -ODT) 8 MG disintegrating tablet Take 1 tablet (8 mg total) by mouth every 8 (eight) hours as needed for nausea or vomiting. 04/22/24   Iola Lukes, FNP  SUNSCREENS EX apply topically to face and body daily; Duration: 30 days 10/25/15   [provider]  valACYclovir  (VALTREX ) 1000 MG tablet Take 1 tablet (1,000 mg total) by mouth 3 (three) times daily as needed. For flare ups 06/29/20   Antonio Meth, Jamee R, DO  Vitamin D , Ergocalciferol , (DRISDOL ) 1.25 MG (50000 UNIT) CAPS capsule Take 1 capsule (50,000 Units total) by mouth every 7 (seven) days. 02/22/23   Antonio Meth Jamee SAUNDERS, DO  Norgestim-Eth Estrad Triphasic (ORTHO TRI-CYCLEN, 28, PO) Take 0.035 mg by mouth daily.   10/02/17  [provider]    Physical Exam: Vitals:    04/23/24 1149 04/23/24 1158 04/23/24 1520  BP: 100/63  111/70  Pulse: 96  97  Resp: 18  18  Temp: (!) 97.5 F (36.4 C)    SpO2: 100%  100%  Weight:  61.7 kg   Height:  5' 3 (1.6 m)     Constitutional: Middle-aged female who is in no acute distress and able to follow commands Eyes: Periorbital bruising noted around the right eye.  Pupils otherwise reactive to light. ENMT: Mucous membranes are moist. Posterior pharynx clear of any exudate or lesions. Neck: normal, supple  Respiratory: clear to auscultation bilaterally, no wheezing, no crackles. Normal respiratory effort. No accessory muscle use.  Cardiovascular: Tachycardic without murmur present.  No significant lower extremity edema appreciated. Abdomen: Gastric tenderness to palpation.  Bowel sounds positive.  Musculoskeletal: no clubbing / cyanosis. No joint deformity upper and lower extremities. Good ROM, no contractures. Normal muscle tone.  Skin: Bruising noted around the right eye and forehead Neurologic: CN 2-12 grossly intact. S able to move all extremities.  Slight tremor appreciated. Psychiatric: Normal judgment and insight. Alert and oriented x 3. Normal mood.   Data Reviewed:  EKG reveals sinus tachycardia 108 bpm.  Reviewed labs, imaging, and pertinent records as documented.  Assessment and Plan: Suspected alcoholic pancreatitis Acute.  Patient presents with complaints of severe epigastric abdominal pain with nausea and vomiting.  Reports drinking vodka on a daily basis.  CT scan of the abdomen pelvis noted acute pancreatitis without signs of abscess or pseudocyst.  Suspecting pancreatitis secondary to alcohol abuse. - Admit to a telemetry bed - Aspiration precautions with elevation head of bed - Clear liquid diet and advance as tolerated - Oxycodone /morphine  IV as needed for moderate to severe pain respectively - D5-0.45% normal saline IV fluids 125 mL/h  Leukocytosis Acute.  WBC elevated 12.7.  Thought secondary  to above. - Check urinalysis once able - Recheck CBC tomorrow morning  Acute kidney injury Patient presents with creatinine elevated up to 3.6 now with BUN 51.  Baseline creatinine previously noted to be around 0.5-0.6.    - Check  urinalysis  - Avoid nephrotoxic agents - Continue IV fluids - Recheck kidney function in a.m.  Fall at home Prior to arrival.  Patient reports having a fall approximately 2 weeks ago where she fell and hit her face at home.  She was not evaluated for the fall. - Check CT scan of the head - PT to evaluate and treat  Hyperglycemia Acute.  On admission glucose noted to be elevated at 180. - Hypoglycemia protocols - Check hemoglobin A1c - CBGs before every meal with very sensitive SSI - Adjust regimen as deemed medically appropriate  Metabolic acidosis with increased anion gap CO2 noted to be 9 with anion gap of 34.  Thought secondary to alcoholic ketoacidosis. - Continue D5 IV fluids  Alcohol abuse Patient reports drinking 5 mixed drinks of vodka per day on average with last drink yesterday. - CIWA protocols initiated with Ativan  - Thiamine , multivitamin, folic acid  - Transitions of care consulted for alcohol abuse  Elevated liver function test AST elevated at 96 and total bilirubin 3.6.  Thought secondary to patient's history of alcohol abuse.  CT noted patient have enlarged fatty liver suggestive of steatohepatitis. - Continue to monitor LFTs  Essential hypertension Blood pressures noted to be maintained at this time. - Held home blood pressure regimen.  Reassess and determine when medically appropriate to resume medications such as metoprolol   History of breast cancer Patient is currently receiving Zoladex  every 28 days. - Continue anastrozole  - Continue outpatient follow-up with Dr. Loretha of oncology  Thrombocytopenia Acute on chronic.  Platelet count noted to be 122 which appears slightly lower than prior check back in 2024. - Continue to  monitor  Nicotine vape use Patient reports vaping nicotine products on a regular basis. - Counseled on the need of cessation of nicotine vape use  GERD - Substituted Protonix  IV  DVT prophylaxis: Lovenox  Advance Care Planning:   Code Status: Full Code   Consults: None  Family Communication: Patient's husband updated at bedside Severity of Illness: The appropriate patient status for this patient is INPATIENT. Inpatient status is judged to be reasonable and necessary in order to provide the required intensity of service to ensure the patient's safety. The patient's presenting symptoms, physical exam findings, and initial radiographic and laboratory data in the context of their chronic comorbidities is felt to place them at high risk for further clinical deterioration. Furthermore, it is not anticipated that the patient will be medically stable for discharge from the hospital within 2 midnights of admission.   * I certify that at the point of admission it is my clinical judgment that the patient will require inpatient hospital care spanning beyond 2 midnights from the point of admission due to high intensity of service, high risk for further deterioration and high frequency of surveillance required.*  Author: Maximino DELENA Sharps, MD 04/23/2024 3:50 PM  For on call review [email or web address].

## 2024-04-23 NOTE — ED Notes (Signed)
 Pt unable to give urine sample

## 2024-04-23 NOTE — ED Triage Notes (Signed)
 Pt. Stated, [name] had stomach pain with N/V since Tuesday.

## 2024-04-24 ENCOUNTER — Inpatient Hospital Stay (HOSPITAL_COMMUNITY)

## 2024-04-24 DIAGNOSIS — K852 Alcohol induced acute pancreatitis without necrosis or infection: Secondary | ICD-10-CM | POA: Diagnosis not present

## 2024-04-24 DIAGNOSIS — E860 Dehydration: Secondary | ICD-10-CM

## 2024-04-24 DIAGNOSIS — N179 Acute kidney failure, unspecified: Secondary | ICD-10-CM | POA: Diagnosis not present

## 2024-04-24 LAB — CBC
HCT: 29.6 % — ABNORMAL LOW (ref 36.0–46.0)
Hemoglobin: 10.1 g/dL — ABNORMAL LOW (ref 12.0–15.0)
MCH: 33.8 pg (ref 26.0–34.0)
MCHC: 34.1 g/dL (ref 30.0–36.0)
MCV: 99 fL (ref 80.0–100.0)
Platelets: 76 K/uL — ABNORMAL LOW (ref 150–400)
RBC: 2.99 MIL/uL — ABNORMAL LOW (ref 3.87–5.11)
RDW: 12.9 % (ref 11.5–15.5)
WBC: 5.9 K/uL (ref 4.0–10.5)
nRBC: 0 % (ref 0.0–0.2)

## 2024-04-24 LAB — PROTIME-INR
INR: 1.2 (ref 0.8–1.2)
Prothrombin Time: 16.4 s — ABNORMAL HIGH (ref 11.4–15.2)

## 2024-04-24 LAB — COMPREHENSIVE METABOLIC PANEL WITH GFR
ALT: 29 U/L (ref 0–44)
AST: 77 U/L — ABNORMAL HIGH (ref 15–41)
Albumin: 3.7 g/dL (ref 3.5–5.0)
Alkaline Phosphatase: 52 U/L (ref 38–126)
Anion gap: 16 — ABNORMAL HIGH (ref 5–15)
BUN: 47 mg/dL — ABNORMAL HIGH (ref 6–20)
CO2: 19 mmol/L — ABNORMAL LOW (ref 22–32)
Calcium: 8.7 mg/dL — ABNORMAL LOW (ref 8.9–10.3)
Chloride: 94 mmol/L — ABNORMAL LOW (ref 98–111)
Creatinine, Ser: 3.35 mg/dL — ABNORMAL HIGH (ref 0.44–1.00)
GFR, Estimated: 16 mL/min — ABNORMAL LOW (ref >60)
Glucose, Bld: 176 mg/dL — ABNORMAL HIGH (ref 70–99)
Potassium: 3.4 mmol/L — ABNORMAL LOW (ref 3.5–5.1)
Sodium: 129 mmol/L — ABNORMAL LOW (ref 135–145)
Total Bilirubin: 1.8 mg/dL — ABNORMAL HIGH (ref 0.0–1.2)
Total Protein: 7.1 g/dL (ref 6.5–8.1)

## 2024-04-24 LAB — GLUCOSE, CAPILLARY
Glucose-Capillary: 164 mg/dL — ABNORMAL HIGH (ref 70–99)
Glucose-Capillary: 193 mg/dL — ABNORMAL HIGH (ref 70–99)
Glucose-Capillary: 210 mg/dL — ABNORMAL HIGH (ref 70–99)
Glucose-Capillary: 174 mg/dL — ABNORMAL HIGH (ref 70–99)
Glucose-Capillary: 185 mg/dL — ABNORMAL HIGH (ref 70–99)

## 2024-04-24 MED ORDER — ORAL CARE MOUTH RINSE: 15.0000 mL | OROMUCOSAL | Status: DC | PRN

## 2024-04-24 MED ORDER — POTASSIUM CHLORIDE 10 MEQ/100ML IV SOLN
10.0000 meq | INTRAVENOUS | Status: AC
Start: 2024-04-24 — End: 2024-04-24
  Administered 2024-04-24 (×6): 10 meq via INTRAVENOUS
  Filled 2024-04-24 (×5): qty 100

## 2024-04-24 MED ORDER — DEXTROSE-SODIUM CHLORIDE 5-0.45 % IV SOLN: INTRAVENOUS | Status: AC

## 2024-04-24 MED ORDER — MAGNESIUM SULFATE 4 GM/100ML IV SOLN
4.0000 g | Freq: Once | INTRAVENOUS | Status: AC
  Administered 2024-04-24: 4 g via INTRAVENOUS
  Filled 2024-04-24: qty 100

## 2024-04-24 MED ORDER — POTASSIUM CHLORIDE CRYS ER 20 MEQ PO TBCR: 60.0000 meq | EXTENDED_RELEASE_TABLET | Freq: Once | ORAL | Status: DC

## 2024-04-24 MED ORDER — POTASSIUM CHLORIDE 10 MEQ/100ML IV SOLN
10.0000 meq | INTRAVENOUS | Status: DC
  Filled 2024-04-24: qty 100

## 2024-04-24 NOTE — Plan of Care (Signed)

## 2024-04-24 NOTE — Evaluation (Signed)
 Physical Therapy Evaluation Patient Details Name: [name] MRN: [id number] DOB: 11-01-1975 Today's Date: 04/24/2024  History of Present Illness  48 yo F adm 04/23/24 with Abd pain, vomiting, alcoholic pancreatitis. PMhx: HTN, GERD, ETOH abuse, AKI, breast CA, ADD, tremor  Clinical Impression  Pt pleasant with flat affect and spouse present. Pt reports no difficulties with gait or mobility at baseline and spouse stating unsteady gait only at night. Pt with shuffling wide BOS with gait needing guarding and cues for balance vs use of RW for stability. Pt educated for fall risk , need for RW and plan. Pt will benefit from acute therapy to maximize safety, balance and independence to decrease burden of care. OPPT for balance training recommended.         If plan is discharge home, recommend the following: A little help with walking and/or transfers;A little help with bathing/dressing/bathroom;Assistance with cooking/housework;Direct supervision/assist for financial management;Assist for transportation   Can travel by private vehicle        Equipment Recommendations Rolling walker (2 wheels);BSC/3in1  Recommendations for Other Services  OT consult    Functional Status Assessment Patient has had a recent decline in their functional status and demonstrates the ability to make significant improvements in function in a reasonable and predictable amount of time.     Precautions / Restrictions Precautions Precautions: Fall;Other (comment) Recall of Precautions/Restrictions: Impaired Precaution/Restrictions Comments: vomiting      Mobility  Bed Mobility Overal bed mobility: Modified Independent             General bed mobility comments: pt pivoted to Rt side of bed and back to bed with HOB 25 degrees, denied OOB to chair end of session    Transfers Overall transfer level: Needs assistance   Transfers: Sit to/from Stand Sit to Stand: Contact guard assist            General transfer comment: CGA for safety and balance    Ambulation/Gait Ambulation/Gait assistance: Contact guard assist Gait Distance (Feet): 200 Feet Assistive device: None, Rolling walker (2 wheels), IV Pole Gait Pattern/deviations: Step-through pattern, Decreased stride length, Wide base of support   Gait velocity interpretation: <1.8 ft/sec, indicate of risk for recurrent falls   General Gait Details: pt with wide BOS, shuffling gait with unsteady steps. Pt frequently reaching for hall railing for support with CGA for stability and use of IV pole grossly 10'. Pt trialed RW for 12' with improved stability and education for continued use to prevent falls  Stairs            Wheelchair Mobility     Tilt Bed    Modified Rankin (Stroke Patients Only)       Balance Overall balance assessment: Needs assistance, History of Falls Sitting-balance support: No upper extremity supported, Feet supported Sitting balance-Leahy Scale: Fair     Standing balance support: No upper extremity supported, During functional activity Standing balance-Leahy Scale: Fair                               Pertinent Vitals/Pain Pain Assessment Pain Assessment: No/denies pain    Home Living Family/patient expects to be discharged to:: Private residence Living Arrangements: Spouse/significant other Available Help at Discharge: Family;Available 24 hours/day Type of Home: House Home Access: Stairs to enter   [employer] of Steps: 1   Home Layout: One level Home Equipment: None      Prior Function Prior Level of Function :  Independent/Modified Independent;Driving                     Extremity/Trunk Assessment   Upper Extremity Assessment Upper Extremity Assessment: Overall WFL for tasks assessed    Lower Extremity Assessment Lower Extremity Assessment: Generalized weakness    Cervical / Trunk Assessment Cervical / Trunk Assessment: Normal   Communication   Communication Communication: No apparent difficulties    Cognition Arousal: Alert Behavior During Therapy: Flat affect   PT - Cognitive impairments: Safety/Judgement                         Following commands: Intact       Cueing Cueing Techniques: Verbal cues     General Comments      Exercises     Assessment/Plan    PT Assessment Patient needs continued PT services  PT Problem List Decreased coordination;Decreased activity tolerance;Decreased balance;Decreased mobility;Decreased safety awareness;Decreased knowledge of use of DME;Decreased cognition       PT Treatment Interventions DME instruction;Balance training;Gait training;Stair training;Functional mobility training;Patient/family education;Therapeutic activities;Therapeutic exercise;Neuromuscular re-education    PT Goals (Current goals can be found in the Care Plan section)  Acute Rehab PT Goals Patient Stated Goal: return home, decrease drinking PT Goal Formulation: With patient/family Time For Goal Achievement: 05/08/24 Potential to Achieve Goals: Fair    Frequency Min 2X/week     Co-evaluation               AM-PAC PT 6 Clicks Mobility  Outcome Measure Help needed turning from your back to your side while in a flat bed without using bedrails?: None Help needed moving from lying on your back to sitting on the side of a flat bed without using bedrails?: None Help needed moving to and from a bed to a chair (including a wheelchair)?: A Little Help needed standing up from a chair using your arms (e.g., wheelchair or bedside chair)?: A Little Help needed to walk in hospital room?: A Little Help needed climbing 3-5 steps with a railing? : A Lot 6 Click Score: 19    End of Session   Activity Tolerance: Patient tolerated treatment well Patient left: in bed;with call bell/phone within reach;with bed alarm set;with family/visitor present Nurse Communication: Mobility  status PT Visit Diagnosis: Other abnormalities of gait and mobility (R26.89);Difficulty in walking, not elsewhere classified (R26.2);History of falling (Z91.81)    Time: 9071-9046 PT Time Calculation (min) (ACUTE ONLY): 25 min   Charges:   PT Evaluation $PT Eval Moderate Complexity: 1 Mod PT Treatments $Gait Training: 8-22 mins PT General Charges $$ ACUTE PT VISIT: 1 Visit         Lenoard SQUIBB, PT Acute Rehabilitation Services Office: [phone]   Nekia Maxham B Yohan Samons 04/24/2024, 11:10 AM

## 2024-04-24 NOTE — TOC CM/SW Note (Signed)
 CSW attached outpatient substance use treatment services resources to patients AVS.

## 2024-04-24 NOTE — Progress Notes (Signed)
 Progress Note   Patient: [name] FMW:[id number] DOB: [birth year] DOA: 04/23/2024     1 DOS: the patient was seen and examined on 04/24/2024   Brief hospital course: 48 y.o. female with medical history significant of hypertension, history of breast cancer, alcohol abuse, and GERD presents with abdominal pain and severe vomiting. Admitted for presumed alcoholic pancreatitis  Assessment and Plan: Suspected alcoholic pancreatitis Acute.  Patient presents with complaints of severe epigastric abdominal pain with nausea and vomiting.  Reports drinking vodka on a daily basis.  CT scan of the abdomen pelvis noted acute pancreatitis without signs of abscess or pseudocyst.  Suspecting pancreatitis secondary to alcohol abuse. - continued on clears for now - cont aggressive hydration and analgesia - Remains nauseated. Cont anti-emetic as needed   Leukocytosis Acute.  WBC elevated 12.7.  Thought secondary to above. - not having urinary symptoms -lower chest clear on CT abd.  -WBC normalized. Likely reactive   Acute kidney injury Patient presents with creatinine elevated up to 3.6 now with BUN 51.  Baseline creatinine previously noted to be around 0.5-0.6.    - UA with >300 protein  - CT abd with unremarkable looking kidneys -Will obtain renal US  -Cont IVF hydration   Fall at home Prior to arrival.  Patient reports having a fall approximately 2 weeks ago where she fell and hit her face at home.  She was not evaluated for the fall. - CT head reviewed, neg -PT/OT consult   Hyperglycemia Acute.  On admission glucose noted to be elevated at 180. - Hypoglycemia protocols - Check hemoglobin A1c - CBGs before every meal with very sensitive SSI - Adjust regimen as deemed medically appropriate   Metabolic acidosis with increased anion gap CO2 noted to be 9 with anion gap of 34.  Thought secondary to alcoholic ketoacidosis. - Continue D5 IV fluids   Alcohol abuse Patient reports  drinking 5 mixed drinks of vodka per day on average with last drink yesterday. - CIWA protocols initiated with Ativan  - Thiamine , multivitamin, folic acid  - Transitions of care consulted for alcohol abuse   Elevated liver function test AST elevated at 96 and total bilirubin 3.6.  Thought secondary to patient's history of alcohol abuse.  CT noted patient have enlarged fatty liver suggestive of steatohepatitis. - Continue to monitor LFTs -ETOH cessation done at bedside   Essential hypertension Blood pressures noted to be maintained at this time. - Held home blood pressure regimen.  Reassess and determine when medically appropriate to resume medications such as metoprolol    History of breast cancer Patient is currently receiving Zoladex  every 28 days. - Continue anastrozole  - Continue outpatient follow-up with Dr. Loretha of oncology   Thrombocytopenia Acute on chronic.   -Plts down to 76. Suspect related to acute illness -hold proph lovenox  and change to SCD's   Nicotine vape use Patient reports vaping nicotine products on a regular basis. - Counseled on the need of cessation of nicotine vape use   GERD - Substituted Protonix  IV      Subjective: Still complaining of nausea, willing to continue clear liquids  Physical Exam: Vitals:   04/24/24 0831 04/24/24 1040 04/24/24 1152 04/24/24 1557  BP: 91/66 95/70 93/73  105/81  Pulse: 98 92 86 86  Resp: 14  14 14   Temp: 98.4 F (36.9 C)  98.1 F (36.7 C) 98.4 F (36.9 C)  TempSrc: Oral  Oral Oral  SpO2: 96%  99% 99%  Weight:      Height:  General exam: Awake, laying in bed, in nad Respiratory system: Normal respiratory effort, no wheezing Cardiovascular system: regular rate, s1, s2 Gastrointestinal system: Soft, nondistended, positive BS Central nervous system: CN2-12 grossly intact, strength intact Extremities: Perfused, no clubbing Skin: Normal skin turgor, no notable skin lesions seen Psychiatry: Mood normal //  affect seems normal  Data Reviewed:  Labs reviewed: Na 129, K 3.4, Cr 3.35, WBC 5.9, Hgb 10.1, Plts 76  Family Communication: Pt in room, family not at bedside  Disposition: Status is: Inpatient Remains inpatient appropriate because: severity of illness  Planned Discharge Destination: Home     Author: Garnette Pelt, MD 04/24/2024 5:24 PM  For on call review [email or web address].

## 2024-04-24 NOTE — TOC CM/SW Note (Signed)
 Transition of Care [facility]) - Inpatient Brief Assessment   Patient Details  Name: [name] MRN: [id number] Date of Birth: June 15, 1976  Transition of Care [facility]) CM/SW Contact:    Sudie Erminio Deems, RN Phone Number: 04/24/2024, 11:44 AM   Clinical Narrative: Patient presented for abdominal pain and emesis. PTA patient states she was from home with spouse and he drives her to PCP appointments. Inpatient Case Manager discussed outpatient PT and the patient is hesitant; however, states it will be okay for ICM to arrange. An ambulatory referral was submitted via Epic and the office will call with visit times. No further needs identified at this time.    Transition of Care Asessment: Insurance and Status: Insurance coverage has been reviewed Patient has primary care physician: Yes Home environment has been reviewed: reviewed Prior level of function:: independent Prior/Current Home Services: No current home services Social Drivers of Health Review: SDOH reviewed no interventions necessary Readmission risk has been reviewed: Yes Transition of care needs: no transition of care needs at this time

## 2024-04-24 NOTE — Discharge Instructions (Signed)
Outpatient Substance Use Treatment Services   [facility] Intensive Outpatient Program [address] [place], [place] [address]  [phone] Private insurance, Medicare A&B, and GCCN   [facility])  [address]  [place], [place] [address] [phone] Medicaid, Self Pay   [facility]      [address]  [place], [place] [phone] Medicaid and Private Insurance, Self Pay   [facility] [address]  [place], [place]  [phone] Private Insurance, and Self Pay  [facility]      [address]    [place], [place] [address]  [phone] or [phone] Private Insurance Only                 [facility] [address]  [place], [place] [address] [phone] Medicaid, Medicare, Private Insurance  [facility] [address]  [place], [place] [address] [phone] Services are free or reduced  [facility]  [address] [phone]  Self Pay only, sliding scale  [facility]  [address]  [place], [place] [address] [phone] ([facility]) Self Pay, Medicaid Only   [facility] [address]  [place], [place] [address] [phone] Medicaid, Medicare, Private Insurance                     Adolescent Substance Use Treatment Services    [facility] [address]  [place], [place]  [phone] Self Pay Offer scholarships from the [employer] to help pay for treatment  Website: www.[facility].com  [facility] use [facility] Males ages: 12-17 Adolescent Substance use Program Females: 12-17  [place] Office [address]  [place], [place] [address] (ph) [phone]  (fax) [phone]  [place]  Office  [address]  [place], [place] [address] (ph) [phone]  (fax) [phone]  [place] Office [address]  [place], [place] [address] (ph) [phone]  (fax) [phone]  [place] Office [address], [place], [place] [address] (ph) [phone]   (fax) [phone]  Website: [email or web address]://youthhavenservices.com/          [facility] Substance Abuse Intensive Outpatient Program for Adolescents Phone: [phone] Address: [address], [place], [place] Website: [email or web address]    Residential Substance Use Treatment Services   [facility]  [address]  [place], [place] [address]  [phone] or [phone] Detox (Medicare, Medicaid, private insurance, and self pay)  Residential Rehab 14 days (Medicare, Medicaid, private insurance, and self pay)   [facility])  [address] [place], [place]  [phone]  Female and Female Detox (Self Pay and Medicaid limited availability)  Rehab only Female (Medicaid and self pay only)   [facility]      [address]  , [place] [address]  [phone] or [phone] Detox and Residential Treatment Private Insurance Only   [facility]  [address]  [place], [place] [address]  [phone]  Treatment Only, must make assessment appointment, and must be sober for assessment appointment.  Self Pay Only, Medicare A&B, [place] Medicaid, [place] ID only! *Transportation assistance offered from [employer] [address]  [facility]     [address] [place], [place] [address] Walk in interviews M-Sat 8-4p No   pending legal charges [phone]     [facility]:  [facility] Referral  [address] [place], [place] [phone] (Self Pay, Medicaid)  [facility] [address] [place], [place] [address] [phone] Detox and Residential Treatment Medicare and Private Insurance  [facility] [address]  [place], [place] [address] 28 Day Women's Facility: [phone] 28 Day Men's Facility: [phone] Long-term Residential Program:  [phone] Males 25 and Over (No Insurance, upfront fee)  [facility]  [address] [place], [place] [address] [phone] Private Insurance with [employer], Private Pay  [facility] [address] [place], [place] [address] Local [phone] Private Insurance Only  [facility] [address]  [place], [place] [address]  [phone] (Males, upfront fee)  [facility] [place] [address]  [place], [address] [phone] Private Insurance      [facility] Locations  [facility]  [address]  [place], [place]  [phone] Christian Based Program for individuals experiencing homelessness Self Pay, No insurance  [facility]  Men's program: [facility] [address]  [place], [place] [address] [phone]  [facility] Women's program: [facility] [address] [place], [place] [address] [phone] Christian Based Program for individuals experiencing homelessness Self Pay, No insurance  [facility] [address]  [place], [place] [address]  [phone] Christian Based Program for individuals experiencing homelessness Self Pay, No insurance  [facility] [address]  [place], [place] [address] [phone] Christian Based Program for individuals experiencing homelessness Self Pay, No insurance  [facility] [address] [place],  [phone] Christian Based Program for males experiencing homelessness Self Pay, No insurance 

## 2024-04-24 NOTE — Hospital Course (Signed)
 48 y.o. female with medical history significant of hypertension, history of breast cancer, alcohol abuse, and GERD presents with abdominal pain and severe vomiting. Admitted for presumed alcoholic pancreatitis

## 2024-04-25 DIAGNOSIS — N179 Acute kidney failure, unspecified: Secondary | ICD-10-CM | POA: Diagnosis not present

## 2024-04-25 DIAGNOSIS — K852 Alcohol induced acute pancreatitis without necrosis or infection: Secondary | ICD-10-CM | POA: Diagnosis not present

## 2024-04-25 LAB — COMPREHENSIVE METABOLIC PANEL WITH GFR
ALT: 26 U/L (ref 0–44)
AST: 78 U/L — ABNORMAL HIGH (ref 15–41)
Albumin: 3.6 g/dL (ref 3.5–5.0)
Alkaline Phosphatase: 54 U/L (ref 38–126)
Anion gap: 14 (ref 5–15)
BUN: 36 mg/dL — ABNORMAL HIGH (ref 6–20)
CO2: 20 mmol/L — ABNORMAL LOW (ref 22–32)
Calcium: 8.8 mg/dL — ABNORMAL LOW (ref 8.9–10.3)
Chloride: 94 mmol/L — ABNORMAL LOW (ref 98–111)
Creatinine, Ser: 2.34 mg/dL — ABNORMAL HIGH (ref 0.44–1.00)
GFR, Estimated: 25 mL/min — ABNORMAL LOW (ref >60)
Glucose, Bld: 180 mg/dL — ABNORMAL HIGH (ref 70–99)
Potassium: 3.5 mmol/L (ref 3.5–5.1)
Sodium: 128 mmol/L — ABNORMAL LOW (ref 135–145)
Total Bilirubin: 1.5 mg/dL — ABNORMAL HIGH (ref 0.0–1.2)
Total Protein: 7 g/dL (ref 6.5–8.1)

## 2024-04-25 LAB — CBC
HCT: 30.6 % — ABNORMAL LOW (ref 36.0–46.0)
Hemoglobin: 10.5 g/dL — ABNORMAL LOW (ref 12.0–15.0)
MCH: 33.3 pg (ref 26.0–34.0)
MCHC: 34.3 g/dL (ref 30.0–36.0)
MCV: 97.1 fL (ref 80.0–100.0)
Platelets: 78 K/uL — ABNORMAL LOW (ref 150–400)
RBC: 3.15 MIL/uL — ABNORMAL LOW (ref 3.87–5.11)
RDW: 13 % (ref 11.5–15.5)
WBC: 4.8 K/uL (ref 4.0–10.5)
nRBC: 0 % (ref 0.0–0.2)

## 2024-04-25 LAB — GLUCOSE, CAPILLARY
Glucose-Capillary: 187 mg/dL — ABNORMAL HIGH (ref 70–99)
Glucose-Capillary: 244 mg/dL — ABNORMAL HIGH (ref 70–99)
Glucose-Capillary: 149 mg/dL — ABNORMAL HIGH (ref 70–99)
Glucose-Capillary: 152 mg/dL — ABNORMAL HIGH (ref 70–99)

## 2024-04-25 LAB — MAGNESIUM: Magnesium: 2.4 mg/dL (ref 1.7–2.4)

## 2024-04-25 MED ORDER — INSULIN ASPART 100 UNIT/ML IJ SOLN
0.0000 [IU] | Freq: Three times a day (TID) | INTRAMUSCULAR | Status: DC
  Administered 2024-04-26 (×3): 2 [IU] via SUBCUTANEOUS
  Administered 2024-04-27 (×2): 1 [IU] via SUBCUTANEOUS
  Administered 2024-04-28: 2 [IU] via SUBCUTANEOUS
  Administered 2024-04-28: 3 [IU] via SUBCUTANEOUS
  Administered 2024-04-29: 1 [IU] via SUBCUTANEOUS

## 2024-04-25 MED ORDER — POLYETHYLENE GLYCOL 3350 17 G PO PACK
17.0000 g | PACK | Freq: Every day | ORAL | Status: DC | PRN
  Administered 2024-04-26 – 2024-04-28 (×3): 17 g via ORAL
  Filled 2024-04-25 (×3): qty 1

## 2024-04-25 MED ORDER — ENSURE PLUS HIGH PROTEIN PO LIQD
237.0000 mL | Freq: Two times a day (BID) | ORAL | Status: DC
  Administered 2024-04-26 – 2024-04-29 (×6): 237 mL via ORAL

## 2024-04-25 MED ORDER — INSULIN ASPART 100 UNIT/ML IJ SOLN: 0.0000 [IU] | Freq: Every day | INTRAMUSCULAR | Status: DC

## 2024-04-25 MED ORDER — DOCUSATE SODIUM 100 MG PO CAPS
100.0000 mg | ORAL_CAPSULE | Freq: Two times a day (BID) | ORAL | Status: DC
  Administered 2024-04-25 – 2024-04-29 (×8): 100 mg via ORAL
  Filled 2024-04-25 (×9): qty 1

## 2024-04-25 NOTE — Plan of Care (Signed)

## 2024-04-25 NOTE — Evaluation (Signed)
 Occupational Therapy Evaluation Patient Details Name: [name] MRN: [id number] DOB: Aug 22, 1975 Today's Date: 04/25/2024   History of Present Illness   48 yo F adm 04/23/24 with Abd pain, vomiting, alcoholic pancreatitis. PMhx: HTN, GERD, ETOH abuse, AKI, breast CA, ADD, tremor     Clinical Impressions Pt admitted based on above, and was seen based on problem list below. PTA pt was independent with ADLs and IADLs. Today pt is requiring set up  to CGA for ADLs. Functional transfers are  CGA with RW. Noted pt with decreased smoothness of visual tracking, poor depth perception, and decreased bil FMC. Today pt primarily limited by decreased balance. Pt would benefit from outpatient OT to improve deficits above. OT will continue to follow acutely to maximize functional independence.        If plan is discharge home, recommend the following:   A little help with walking and/or transfers;A little help with bathing/dressing/bathroom;Assistance with cooking/housework     Functional Status Assessment   Patient has had a recent decline in their functional status and demonstrates the ability to make significant improvements in function in a reasonable and predictable amount of time.     Equipment Recommendations   Tub/shower seat      Precautions/Restrictions   Precautions Precautions: Fall;Other (comment) Recall of Precautions/Restrictions: Impaired Restrictions Weight Bearing Restrictions Per Provider Order: No     Mobility Bed Mobility Overal bed mobility: Modified Independent      Transfers Overall transfer level: Needs assistance Equipment used: Rolling walker (2 wheels) Transfers: Sit to/from Stand Sit to Stand: Contact guard assist           General transfer comment: CGA for safety and balance      Balance Overall balance assessment: Needs assistance, History of Falls Sitting-balance support: No upper extremity supported, Feet  supported Sitting balance-Leahy Scale: Fair     Standing balance support: Bilateral upper extremity supported, During functional activity, Reliant on assistive device for balance Standing balance-Leahy Scale: Fair Standing balance comment: Poor balance without BUE support       ADL either performed or assessed with clinical judgement   ADL Overall ADL's : Needs assistance/impaired Eating/Feeding: Set up;Sitting   Grooming: Oral care;Contact guard assist;Standing Grooming Details (indicate cue type and reason): CGA for balance, no UE support         Upper Body Dressing : Set up;Sitting   Lower Body Dressing: Contact guard assist;Sit to/from stand   Toilet Transfer: Contact guard assist;Rolling walker (2 wheels)           Functional mobility during ADLs: Contact guard assist;Rolling walker (2 wheels) General ADL Comments: Poor standing balance for tasks     Vision Baseline Vision/History: 0 No visual deficits Patient Visual Report: No change from baseline Vision Assessment?: No apparent visual deficits Additional Comments: Decreased smoothness with tracking, poor depth perception            Pertinent Vitals/Pain Pain Assessment Pain Assessment: No/denies pain     Extremity/Trunk Assessment Upper Extremity Assessment Upper Extremity Assessment: Generalized weakness (Poor bil coordination)   Lower Extremity Assessment Lower Extremity Assessment: Defer to PT evaluation   Cervical / Trunk Assessment Cervical / Trunk Assessment: Normal   Communication Communication Communication: No apparent difficulties   Cognition Arousal: Alert Behavior During Therapy: Flat affect Cognition: No apparent impairments       OT - Cognition Comments: Delayed processing and poor safety but largely [facility]       Following commands: Intact  Cueing  General Comments   Cueing Techniques: Verbal cues  Family present at end of session           Home Living  Family/patient expects to be discharged to:: Private residence Living Arrangements: Spouse/significant other Available Help at Discharge: Family;Available 24 hours/day Type of Home: House Home Access: Stairs to enter [employer] of Steps: 1 Entrance Stairs-Rails: None Home Layout: One level     Bathroom Shower/Tub: Producer, television/film/video: Standard Bathroom Accessibility: Yes How Accessible: Accessible via walker Home Equipment: None          Prior Functioning/Environment Prior Level of Function : Independent/Modified Independent;Driving             Mobility Comments: No AD, hx of falls      OT Problem List: Decreased strength;Decreased range of motion;Impaired balance (sitting and/or standing);Decreased activity tolerance;Decreased knowledge of use of DME or AE;Cardiopulmonary status limiting activity   OT Treatment/Interventions: Self-care/ADL training;Therapeutic exercise;Energy conservation;Therapeutic activities;Patient/family education;Balance training      OT Goals(Current goals can be found in the care plan section)   Acute Rehab OT Goals Patient Stated Goal: To get better OT Goal Formulation: With patient Time For Goal Achievement: 05/09/24 Potential to Achieve Goals: Good   OT Frequency:  Min 2X/week       AM-PAC OT 6 Clicks Daily Activity     Outcome Measure Help from another person eating meals?: None Help from another person taking care of personal grooming?: A Little Help from another person toileting, which includes using toliet, bedpan, or urinal?: A Little Help from another person bathing (including washing, rinsing, drying)?: A Little Help from another person to put on and taking off regular upper body clothing?: A Little Help from another person to put on and taking off regular lower body clothing?: A Little 6 Click Score: 19   End of Session Equipment Utilized During Treatment: Rolling walker (2 wheels) Nurse  Communication: Mobility status  Activity Tolerance: Patient tolerated treatment well Patient left: in bed;with call bell/phone within reach;with nursing/sitter in room;with family/visitor present  OT Visit Diagnosis: Unsteadiness on feet (R26.81);Other abnormalities of gait and mobility (R26.89);Muscle weakness (generalized) (M62.81);Repeated falls (R29.6);History of falling (Z91.81)                Time: 9045-8984 OT Time Calculation (min): 21 min Charges:  OT General Charges $OT Visit: 1 Visit OT Evaluation $OT Eval Low Complexity: 1 Low  Adrianne BROCKS, OT  Acute Rehabilitation Services Office [phone] Secure chat preferred   Adrianne GORMAN Savers 04/25/2024, 1:24 PM

## 2024-04-25 NOTE — Plan of Care (Signed)
  Problem: Education: Goal: Knowledge of General Education information will improve Description: Including pain rating scale, medication(s)/side effects and non-pharmacologic comfort measures Outcome: Progressing   Problem: Health Behavior/Discharge Planning: Goal: Ability to manage health-related needs will improve Outcome: Progressing   Problem: Clinical Measurements: Goal: Ability to maintain clinical measurements within normal limits will improve Outcome: Progressing Goal: Will remain free from infection Outcome: Progressing Goal: Diagnostic test results will improve Outcome: Progressing Goal: Respiratory complications will improve Outcome: Progressing Goal: Cardiovascular complication will be avoided Outcome: Progressing   Problem: Activity: Goal: Risk for activity intolerance will decrease Outcome: Progressing   Problem: Nutrition: Goal: Adequate nutrition will be maintained Outcome: Progressing   Problem: Coping: Goal: Level of anxiety will decrease Outcome: Progressing   Problem: Elimination: Goal: Will not experience complications related to bowel motility Outcome: Progressing Goal: Will not experience complications related to urinary retention Outcome: Progressing   Problem: Education: Goal: Ability to describe self-care measures that may prevent or decrease complications (Diabetes Survival Skills Education) will improve Outcome: Progressing Goal: Individualized Educational Video(s) Outcome: Progressing   Problem: Coping: Goal: Ability to adjust to condition or change in health will improve Outcome: Progressing   Problem: Fluid Volume: Goal: Ability to maintain a balanced intake and output will improve Outcome: Progressing

## 2024-04-25 NOTE — Progress Notes (Signed)
 Progress Note   Patient: [name] FMW:[id number] DOB: [birth year] DOA: 04/23/2024     2 DOS: the patient was seen and examined on 04/25/2024   Brief hospital course: 48 y.o. female with medical history significant of hypertension, history of breast cancer, alcohol abuse, and GERD presents with abdominal pain and severe vomiting. Admitted for presumed alcoholic pancreatitis  Assessment and Plan: Suspected alcoholic pancreatitis Acute.  Patient presents with complaints of severe epigastric abdominal pain with nausea and vomiting.  Reports drinking vodka on a daily basis.  CT scan of the abdomen pelvis noted acute pancreatitis without signs of abscess or pseudocyst.  Suspecting pancreatitis secondary to alcohol abuse. - Clinically improving and tolerating clears - advance to full liquid today, cont to advance as tolerated   Leukocytosis Acute.  WBC elevated 12.7.  Thought secondary to above. - not having urinary symptoms -lower chest clear on CT abd.  -WBC normalized. Likely reactive   Acute kidney injury Patient presents with creatinine elevated up to 3.6 now with BUN 51.  Baseline creatinine previously noted to be around 0.5-0.6.    - UA with >300 protein  - CT abd with unremarkable looking kidneys -Will obtain renal US  -Cont IVF hydration   Fall at home Prior to arrival.  Patient reports having a fall approximately 2 weeks ago where she fell and hit her face at home.  She was not evaluated for the fall. - CT head reviewed, neg -PT/OT consulted. Recs for outpt PTOT   Hyperglycemia Acute.  On admission glucose noted to be elevated at 180. - Hypoglycemia protocols - Check hemoglobin A1c - CBGs before every meal with very sensitive SSI - Adjust regimen as deemed medically appropriate   Metabolic acidosis with increased anion gap CO2 noted to be 9 with anion gap of 34.  Thought secondary to alcoholic ketoacidosis. - Continue D5 IV fluids   Alcohol abuse Patient  reports drinking 5 mixed drinks of vodka per day on average with last drink yesterday. - CIWA protocols initiated with Ativan  - Thiamine , multivitamin, folic acid  - Transitions of care consulted for alcohol abuse   Elevated liver function test AST elevated at 96 and total bilirubin 3.6.  Thought secondary to patient's history of alcohol abuse.  CT noted patient have enlarged fatty liver suggestive of steatohepatitis. -alk phos normal - Continue to monitor LFTs -ETOH cessation done at bedside   Essential hypertension Blood pressures noted to be maintained at this time. - Held home blood pressure regimen.  Reassess and determine when medically appropriate to resume medications such as metoprolol    History of breast cancer Patient is currently receiving Zoladex  every 28 days. - Continue anastrozole  - Continue outpatient follow-up with Dr. Loretha of oncology   Thrombocytopenia Acute on chronic.   -Plts down to 76. Suspect related to acute illness -hold proph lovenox  and change to SCD's   Nicotine vape use Patient reports vaping nicotine products on a regular basis. - Counseled on the need of cessation of nicotine vape use   GERD - Substituted Protonix  IV      Subjective: Feeling better, tolerating clears  Physical Exam: Vitals:   04/25/24 0800 04/25/24 1000 04/25/24 1030 04/25/24 1202  BP:    108/76  Pulse: 92 95 95 (!) 101  Resp:    15  Temp:    99.2 F (37.3 C)  TempSrc:    Oral  SpO2: 98%  98% 98%  Weight:      Height:  General exam: Awake, laying in bed, in nad Respiratory system: Normal respiratory effort, no wheezing Cardiovascular system: regular rate, s1, s2 Gastrointestinal system: Soft, nondistended, positive BS Central nervous system: CN2-12 grossly intact, strength intact Extremities: Perfused, no clubbing Skin: Normal skin turgor, no notable skin lesions seen Psychiatry: Mood normal // affect seems normal  Data Reviewed:  Labs reviewed: Na 129,  K 3.4, Cr 3.35, WBC 5.9, Hgb 10.1, Plts 76  Family Communication: Pt in room, family not at bedside  Disposition: Status is: Inpatient Remains inpatient appropriate because: severity of illness  Planned Discharge Destination: Home     Author: Garnette Pelt, MD 04/25/2024 3:29 PM  For on call review [email or web address].

## 2024-04-26 DIAGNOSIS — K852 Alcohol induced acute pancreatitis without necrosis or infection: Secondary | ICD-10-CM | POA: Diagnosis not present

## 2024-04-26 DIAGNOSIS — N179 Acute kidney failure, unspecified: Secondary | ICD-10-CM | POA: Diagnosis not present

## 2024-04-26 DIAGNOSIS — E860 Dehydration: Secondary | ICD-10-CM | POA: Diagnosis not present

## 2024-04-26 LAB — COMPREHENSIVE METABOLIC PANEL WITH GFR
ALT: 27 U/L (ref 0–44)
AST: 82 U/L — ABNORMAL HIGH (ref 15–41)
Albumin: 3.5 g/dL (ref 3.5–5.0)
Alkaline Phosphatase: 59 U/L (ref 38–126)
Anion gap: 13 (ref 5–15)
BUN: 25 mg/dL — ABNORMAL HIGH (ref 6–20)
CO2: 21 mmol/L — ABNORMAL LOW (ref 22–32)
Calcium: 9.1 mg/dL (ref 8.9–10.3)
Chloride: 97 mmol/L — ABNORMAL LOW (ref 98–111)
Creatinine, Ser: 1.74 mg/dL — ABNORMAL HIGH (ref 0.44–1.00)
GFR, Estimated: 36 mL/min — ABNORMAL LOW (ref >60)
Glucose, Bld: 141 mg/dL — ABNORMAL HIGH (ref 70–99)
Potassium: 3.2 mmol/L — ABNORMAL LOW (ref 3.5–5.1)
Sodium: 131 mmol/L — ABNORMAL LOW (ref 135–145)
Total Bilirubin: 1.7 mg/dL — ABNORMAL HIGH (ref 0.0–1.2)
Total Protein: 7.2 g/dL (ref 6.5–8.1)

## 2024-04-26 LAB — GLUCOSE, CAPILLARY
Glucose-Capillary: 176 mg/dL — ABNORMAL HIGH (ref 70–99)
Glucose-Capillary: 183 mg/dL — ABNORMAL HIGH (ref 70–99)
Glucose-Capillary: 156 mg/dL — ABNORMAL HIGH (ref 70–99)
Glucose-Capillary: 174 mg/dL — ABNORMAL HIGH (ref 70–99)

## 2024-04-26 LAB — CBC
HCT: 30.2 % — ABNORMAL LOW (ref 36.0–46.0)
Hemoglobin: 10.6 g/dL — ABNORMAL LOW (ref 12.0–15.0)
MCH: 34.3 pg — ABNORMAL HIGH (ref 26.0–34.0)
MCHC: 35.1 g/dL (ref 30.0–36.0)
MCV: 97.7 fL (ref 80.0–100.0)
Platelets: 114 K/uL — ABNORMAL LOW (ref 150–400)
RBC: 3.09 MIL/uL — ABNORMAL LOW (ref 3.87–5.11)
RDW: 13 % (ref 11.5–15.5)
WBC: 5.9 K/uL (ref 4.0–10.5)
nRBC: 0.5 % — ABNORMAL HIGH (ref 0.0–0.2)

## 2024-04-26 MED ORDER — POTASSIUM CHLORIDE CRYS ER 20 MEQ PO TBCR
40.0000 meq | EXTENDED_RELEASE_TABLET | ORAL | Status: AC
  Administered 2024-04-26 (×2): 40 meq via ORAL
  Filled 2024-04-26 (×2): qty 2

## 2024-04-26 MED ORDER — LACTATED RINGERS IV SOLN
INTRAVENOUS | Status: AC
Start: 2024-04-26 — End: 2024-04-27

## 2024-04-26 NOTE — Plan of Care (Signed)
  Problem: Education: Goal: Knowledge of General Education information will improve Description: Including pain rating scale, medication(s)/side effects and non-pharmacologic comfort measures Outcome: Progressing   Problem: Health Behavior/Discharge Planning: Goal: Ability to manage health-related needs will improve Outcome: Progressing   Problem: Clinical Measurements: Goal: Ability to maintain clinical measurements within normal limits will improve Outcome: Progressing   Problem: Activity: Goal: Risk for activity intolerance will decrease Outcome: Progressing   Problem: Nutrition: Goal: Adequate nutrition will be maintained Outcome: Progressing   Problem: Coping: Goal: Level of anxiety will decrease Outcome: Progressing   Problem: Pain Managment: Goal: General experience of comfort will improve and/or be controlled Outcome: Progressing   Problem: Skin Integrity: Goal: Risk for impaired skin integrity will decrease Outcome: Progressing   Problem: Fluid Volume: Goal: Ability to maintain a balanced intake and output will improve Outcome: Progressing

## 2024-04-26 NOTE — Progress Notes (Signed)
 Progress Note   Patient: [name] FMW:[id number] DOB: 08/02/1976 DOA: 04/23/2024     3 DOS: the patient was seen and examined on 04/26/2024   Brief hospital course: 48 y.o. female with medical history significant of hypertension, history of breast cancer, alcohol abuse, and GERD presents with abdominal pain and severe vomiting. Admitted for presumed alcoholic pancreatitis  Assessment and Plan: Suspected alcoholic pancreatitis Acute.  Patient presents with complaints of severe epigastric abdominal pain with nausea and vomiting.  Reports drinking vodka on a daily basis.  CT scan of the abdomen pelvis noted acute pancreatitis without signs of abscess or pseudocyst.  Suspecting pancreatitis secondary to alcohol abuse. - Clinically improving and tolerating clears - Tolerated full liquids overnight, however did not tolerate trial of soft. Will downgrade back to full liquids for now   Leukocytosis Acute.  WBC elevated 12.7.  Thought secondary to above. - not having urinary symptoms -lower chest clear on CT abd.  -WBC normalized. Likely reactive   Acute kidney injury Patient presents with creatinine elevated up to 3.6 now with BUN 51.  Baseline creatinine previously noted to be around 0.5-0.6.    - UA with >300 protein  - CT abd with unremarkable looking kidneys -Will obtain renal US  -Cont IVF hydration   Fall at home Prior to arrival.  Patient reports having a fall approximately 2 weeks ago where she fell and hit her face at home.  She was not evaluated for the fall. - CT head reviewed, neg -PT/OT consulted. Recs for outpt PTOT   Hyperglycemia Acute.  On admission glucose noted to be elevated at 180. - Hypoglycemia protocols - Check hemoglobin A1c - CBGs before every meal with very sensitive SSI - Adjust regimen as deemed medically appropriate   Metabolic acidosis with increased anion gap CO2 noted to be 9 with anion gap of 34.  Thought secondary to alcoholic  ketoacidosis. - Continue D5 IV fluids   Alcohol abuse Patient reports drinking 5 mixed drinks of vodka per day on average with last drink yesterday. - CIWA protocols initiated with Ativan  - Thiamine , multivitamin, folic acid  - Transitions of care consulted for alcohol abuse   Elevated liver function test AST elevated at 96 and total bilirubin 3.6.  Thought secondary to patient's history of alcohol abuse.  CT noted patient have enlarged fatty liver suggestive of steatohepatitis. -alk phos normal - Continue to monitor LFTs -ETOH cessation done at bedside   Essential hypertension Blood pressures noted to be maintained at this time. - Held home blood pressure regimen.  Reassess and determine when medically appropriate to resume medications such as metoprolol    History of breast cancer Patient is currently receiving Zoladex  every 28 days. - Continue anastrozole  - Continue outpatient follow-up with Dr. Loretha of oncology   Thrombocytopenia Acute on chronic.   -Plts down to 76. Suspect related to acute illness -hold proph lovenox  and change to SCD's   Nicotine vape use Patient reports vaping nicotine products on a regular basis. - Counseled on the need of cessation of nicotine vape use   GERD - Substituted Protonix  IV      Subjective: Tolerated full liquids overnight, complained of 6/10 pain and pain to the back when attempting soft diet  Physical Exam: Vitals:   04/26/24 0900 04/26/24 0905 04/26/24 1143 04/26/24 1538  BP:  118/87 109/79 118/88  Pulse: 100 96 (!) 106 99  Resp:  16 18 17   Temp:  99.4 F (37.4 C) 99.1 F (37.3 C) 98.2 F (  36.8 C)  TempSrc:  Oral Oral Oral  SpO2:  100% 97% 99%  Weight:      Height:       General exam: Conversant, in no acute distress Respiratory system: normal chest rise, clear, no audible wheezing Cardiovascular system: regular rhythm, s1-s2 Gastrointestinal system: Nondistended, epigastric tenderness, pos BS Central nervous system:  No seizures, no tremors Extremities: No cyanosis, no joint deformities Skin: No rashes, no pallor Psychiatry: Affect normal // no auditory hallucinations   Data Reviewed:  Labs reviewed: Na 131, K 3.2, Cr 1.74, WBC 5.9, Hgb 10.6, Plts 114  Family Communication: Pt in room, family at bedside  Disposition: Status is: Inpatient Remains inpatient appropriate because: severity of illness  Planned Discharge Destination: Home     Author: Garnette Pelt, MD 04/26/2024 5:24 PM  For on call review [email or web address].

## 2024-04-26 NOTE — Plan of Care (Signed)

## 2024-04-27 ENCOUNTER — Inpatient Hospital Stay (HOSPITAL_COMMUNITY)

## 2024-04-27 DIAGNOSIS — N179 Acute kidney failure, unspecified: Secondary | ICD-10-CM | POA: Diagnosis not present

## 2024-04-27 DIAGNOSIS — E860 Dehydration: Secondary | ICD-10-CM | POA: Diagnosis not present

## 2024-04-27 DIAGNOSIS — K852 Alcohol induced acute pancreatitis without necrosis or infection: Secondary | ICD-10-CM | POA: Diagnosis not present

## 2024-04-27 LAB — COMPREHENSIVE METABOLIC PANEL WITH GFR
ALT: 26 U/L (ref 0–44)
AST: 74 U/L — ABNORMAL HIGH (ref 15–41)
Albumin: 3.4 g/dL — ABNORMAL LOW (ref 3.5–5.0)
Alkaline Phosphatase: 70 U/L (ref 38–126)
Anion gap: 10 (ref 5–15)
BUN: 17 mg/dL (ref 6–20)
CO2: 26 mmol/L (ref 22–32)
Calcium: 9.5 mg/dL (ref 8.9–10.3)
Chloride: 97 mmol/L — ABNORMAL LOW (ref 98–111)
Creatinine, Ser: 1.23 mg/dL — ABNORMAL HIGH (ref 0.44–1.00)
GFR, Estimated: 55 mL/min — ABNORMAL LOW (ref >60)
Glucose, Bld: 155 mg/dL — ABNORMAL HIGH (ref 70–99)
Potassium: 4.2 mmol/L (ref 3.5–5.1)
Sodium: 133 mmol/L — ABNORMAL LOW (ref 135–145)
Total Bilirubin: 1.5 mg/dL — ABNORMAL HIGH (ref 0.0–1.2)
Total Protein: 7.1 g/dL (ref 6.5–8.1)

## 2024-04-27 LAB — LIPASE, BLOOD: Lipase: 108 U/L — ABNORMAL HIGH (ref 11–51)

## 2024-04-27 LAB — CBC
HCT: 31.8 % — ABNORMAL LOW (ref 36.0–46.0)
Hemoglobin: 10.8 g/dL — ABNORMAL LOW (ref 12.0–15.0)
MCH: 33.6 pg (ref 26.0–34.0)
MCHC: 34 g/dL (ref 30.0–36.0)
MCV: 99.1 fL (ref 80.0–100.0)
Platelets: 123 K/uL — ABNORMAL LOW (ref 150–400)
RBC: 3.21 MIL/uL — ABNORMAL LOW (ref 3.87–5.11)
RDW: 12.9 % (ref 11.5–15.5)
WBC: 4.2 K/uL (ref 4.0–10.5)
nRBC: 0 % (ref 0.0–0.2)

## 2024-04-27 LAB — GLUCOSE, CAPILLARY
Glucose-Capillary: 145 mg/dL — ABNORMAL HIGH (ref 70–99)
Glucose-Capillary: 138 mg/dL — ABNORMAL HIGH (ref 70–99)
Glucose-Capillary: 115 mg/dL — ABNORMAL HIGH (ref 70–99)
Glucose-Capillary: 198 mg/dL — ABNORMAL HIGH (ref 70–99)

## 2024-04-27 LAB — MAGNESIUM: Magnesium: 1.5 mg/dL — ABNORMAL LOW (ref 1.7–2.4)

## 2024-04-27 MED ORDER — MAGNESIUM SULFATE 4 GM/100ML IV SOLN
4.0000 g | Freq: Once | INTRAVENOUS | Status: AC
  Administered 2024-04-27: 4 g via INTRAVENOUS
  Filled 2024-04-27: qty 100

## 2024-04-27 NOTE — Progress Notes (Signed)
 Progress Note   Patient: [name] FMW:[id number] DOB: 11-18-1975 DOA: 04/23/2024     4 DOS: the patient was seen and examined on 04/27/2024   Brief hospital course: 48 y.o. female with medical history significant of hypertension, history of breast cancer, alcohol abuse, and GERD presents with abdominal pain and severe vomiting. Admitted for presumed alcoholic pancreatitis  Assessment and Plan: Suspected alcoholic pancreatitis Acute.  Patient presents with complaints of severe epigastric abdominal pain with nausea and vomiting.  Reports drinking vodka on a daily basis.  CT scan of the abdomen pelvis noted acute pancreatitis without signs of abscess or pseudocyst.  Suspecting pancreatitis secondary to alcohol abuse. - Lipase improved to just over 100, LFT's improved - Pt continues to have abd pain. Reports bloating. Abd does feel more distended on exam with pt reporting decreased flatus - Ordered abd xray and RUQ US  - Encourage ambulation as tolerated   Leukocytosis Acute.  WBC elevated 12.7.  Thought secondary to above. - not having urinary symptoms -lower chest clear on CT abd.  -WBC normalized. Likely reactive   Acute kidney injury Patient presents with creatinine elevated up to 3.6 now with BUN 51.  Baseline creatinine previously noted to be around 0.5-0.6.    - UA with >300 protein  - CT abd with unremarkable looking kidneys -Cr improving with hydration   Fall at home Prior to arrival.  Patient reports having a fall approximately 2 weeks ago where she fell and hit her face at home.  She was not evaluated for the fall. - CT head reviewed, neg -PT/OT consulted. Recs for outpt PTOT   Hyperglycemia Acute.  On admission glucose noted to be elevated at 180. - Hypoglycemia protocols - Check hemoglobin A1c - CBGs before every meal with very sensitive SSI - Adjust regimen as deemed medically appropriate   Metabolic acidosis with increased anion gap CO2 noted to be 9  with anion gap of 34.  Thought secondary to alcoholic ketoacidosis. - Continue D5 IV fluids   Alcohol abuse Patient reports drinking 5 mixed drinks of vodka per day on average with last drink yesterday. - CIWA protocols initiated with Ativan  - Thiamine , multivitamin, folic acid  - Transitions of care consulted for alcohol abuse   Elevated liver function test AST elevated at 96 and total bilirubin 3.6.  Thought secondary to patient's history of alcohol abuse.  CT noted patient have enlarged fatty liver suggestive of steatohepatitis. -alk phos normal - Continue to monitor LFTs -ETOH cessation done at bedside -RUQ US  pending   Essential hypertension Blood pressures noted to be maintained at this time. - Held home blood pressure regimen.  Reassess and determine when medically appropriate to resume medications such as metoprolol    History of breast cancer Patient is currently receiving Zoladex  every 28 days. - Continue anastrozole  - Continue outpatient follow-up with Dr. Loretha of oncology   Thrombocytopenia Acute on chronic.   -Now improving   Nicotine vape use Patient reports vaping nicotine products on a regular basis. - Counseled on the need of cessation of nicotine vape use   GERD - Substituted Protonix  IV      Subjective: Complains of increased bloating, decreased flatus, not tolerating full liquids very well  Physical Exam: Vitals:   04/27/24 0013 04/27/24 0427 04/27/24 0740 04/27/24 1145  BP: (!) 109/90 (!) 118/91 (!) 122/91 (!) 122/91  Pulse: 98 100 89 86  Resp: 18 18 17 18   Temp: 98.3 F (36.8 C) 98.3 F (36.8 C) 99.1 F (37.3  C) 99.1 F (37.3 C)  TempSrc: Oral Oral Oral Oral  SpO2: 100% 99% 100% 97%  Weight:      Height:       General exam: Awake, laying in bed, in nad Respiratory system: Normal respiratory effort, no wheezing Cardiovascular system: regular rate, s1, s2 Gastrointestinal system: Soft, nondistended, positive BS Central nervous system:  CN2-12 grossly intact, strength intact Extremities: Perfused, no clubbing Skin: Normal skin turgor, no notable skin lesions seen Psychiatry: Mood normal // no visual hallucinations   Data Reviewed:  Labs reviewed: Na 133, K 4.2, Cr 1.23, WBC 4.2, Hgb 10.8, Plts 123  Family Communication: Pt in room, family at bedside  Disposition: Status is: Inpatient Remains inpatient appropriate because: severity of illness  Planned Discharge Destination: Home     Author: Garnette Pelt, MD 04/27/2024 2:53 PM  For on call review [email or web address].

## 2024-04-27 NOTE — Progress Notes (Signed)
   04/27/24 1118  Spiritual Encounters  Type of Visit Initial  Care provided to: Pt and family  Reason for visit Advance directives  OnCall Visit No  Spiritual Framework  Presenting Themes Impactful experiences and emotions  Community/Connection Family  Needs/Challenges/Barriers Patient is request counseling for substance abuse, depression and anxiety. Pt stated she was lay off and really feeling depressed  Patient Stress Factors Health changes;Exhausted  Family Stress Factors Health changes  Goals  Clinical Care Goals Patient would like to join a counseling group for substance abuse and mental health  Interventions  Spiritual Care Interventions Made Compassionate presence;Established relationship of care and support  Intervention Outcomes  Outcomes Awareness of support;Awareness of health  Mental Health Advance Directives  Would patient like information on creating a mental health advance directive? No - Patient declined   Chaplain visited Pt to discuss Advance Directive (A.D.) with spouse present. Pt stated that her husband is her designated healthcare POA and that she does not wish to complete the AD at this time.  Pt shared interest in receiving  (counseling) support for mental health and substance use during her inpatient stay at [facility].  Pt also disclosed ongoing stressors, including the loss of employment.   Chaplain provided attentive presence, spiritual and emotional support, and informed patient that chaplain services remain available for follow up.

## 2024-04-27 NOTE — TOC Initial Note (Addendum)
 Transition of Care [facility]) - Initial/Assessment Note    Patient Details  Name: [name] MRN: [id number] Date of Birth: 07-22-1976  [facility] of [facility]) CM/SW Contact:    Isaiah Public, LCSWA Phone Number: 04/27/2024, 4:54 PM  Clinical Narrative:                  Patient is from home with spouse. Patients plan is to go to an outpatient substance use  treatment services center when medically ready for dc. CSW delivered resources for outpatient substance use treatment services resources at bedside. Patients spouse BJ informed CSW they plan to review and look into an outpatient substance use treatment center patient may be interested in. BJ confirmed he will reach back out to CSW for any additional questions or any additional assistance needed from CSW for patient. BJ patients spouse confirmed he will provide transportation for patient when she is medically ready for dc. All questions answered. No further questions reported at this time.        Patient Goals and CMS Choice            Expected Discharge Plan and Services                                              Prior Living Arrangements/Services                       Activities of Daily Living   ADL Screening (condition at time of admission) Independently performs ADLs?: Yes (appropriate for developmental age) Is the patient deaf or have difficulty hearing?: No Does the patient have difficulty seeing, even when wearing glasses/contacts?: No Does the patient have difficulty concentrating, remembering, or making decisions?: No  Permission Sought/Granted                  Emotional Assessment              Admission diagnosis:  Acute pancreatitis [K85.90] Dehydration [E86.0] Pancreatitis [K85.90] Metabolic acidosis [E87.20] Acute renal failure, unspecified acute renal failure type (HCC) [N17.9] Alcohol-induced acute pancreatitis, unspecified complication status  [K85.20] Patient Active Problem List   Diagnosis Date Noted   Alcoholic pancreatitis 04/23/2024   Alcohol abuse 04/23/2024   AKI (acute kidney injury) 04/23/2024   Fall at home, initial encounter 04/23/2024   Metabolic acidosis with increased anion gap and accumulation of organic acids 04/23/2024   Elevated liver function tests 04/23/2024   Vapes nicotine containing substance 04/23/2024   Leukocytosis 04/23/2024   Thrombocytopenia 04/23/2024   Hyperglycemia 04/23/2024   History of breast cancer 04/23/2024   Rosacea 04/22/2024   Tremor 02/19/2023   Hematoma of left axilla 11/19/2022   S/P debridement 11/19/2022   Invasive ductal carcinoma of breast, female, left (HCC) 10/15/2022   Hypertensive disorder 09/10/2019   Essential hypertension 03/13/2019   Elevated liver enzymes 11/06/2018   Genetic testing 10/06/2018   Malignant tumor of breast (HCC) 09/24/2018   Family history of breast cancer    Closed bimalleolar fracture of left ankle 10/17/2017   Arthralgia of left ankle 10/02/2017   Dry eye 09/02/2017   Dry eyes 09/02/2017   Elevated BP without diagnosis of hypertension 05/20/2017   Elevated blood-pressure reading without diagnosis of hypertension 05/20/2017   Rectal hemorrhage 05/20/2017   Other dysphagia 02/15/2016   Dysphagia 02/15/2016   HNP (herniated nucleus pulposus),  lumbar 12/15/2015   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 12/15/2015   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   Attention deficit hyperactivity disorder, 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   URI (upper respiratory infection) 10/17/2011   Upper respiratory infection 10/17/2011   Displacement of intervertebral disc, site unspecified, without myelopathy 09/04/2011   Herniation of cervical intervertebral disc without myelopathy 09/04/2011   Herpes genitalis in women 03/05/2011   Herpes simplex of female genitalia 03/05/2011   Disorder of forearm joint  10/17/2010   Enlarged lymph nodes 08/15/2010   Lymphadenopathy 08/15/2010   HEMATURIA UNSPECIFIED 06/01/2009   LOW BACK PAIN, CHRONIC 06/01/2009   Blood in urine 06/01/2009   Low back pain 06/01/2009   CELLULITIS/ABSCESS NOS 09/30/2008   Abscess 09/30/2008   GENITAL HERPES, HX OF 11/18/2007   DEGENERATIVE DISC DISEASE 10/08/2007   Gastritis and gastroduodenitis 06/25/2007   Absence of menstruation 06/25/2007   Backache 06/25/2007   Amenorrhea 06/25/2007   Gastritis 06/25/2007   GERD (gastroesophageal reflux disease) 09/18/2006   PCP:  Antonio Cyndee [name], DO Pharmacy:   [facility] [phone] [address], [place] - [address] [place] [address] Phone: [phone] Fax: [phone]     Social Drivers of Health (SDOH) Social History: SDOH Screenings   Food Insecurity: No Food Insecurity (04/23/2024)  Housing: Low Risk  (04/23/2024)  Transportation Needs: No Transportation Needs (04/23/2024)  Utilities: Not At Risk (04/23/2024)  Depression (PHQ2-9): Medium Risk (02/19/2023)  Tobacco Use: Medium Risk (04/23/2024)   SDOH Interventions:     Readmission Risk Interventions     No data to display

## 2024-04-27 NOTE — Plan of Care (Signed)
 Problem: Education: Goal: Knowledge of General Education information will improve Description: Including pain rating scale, medication(s)/side effects and non-pharmacologic comfort measures Outcome: Progressing   Problem: Health Behavior/Discharge Planning: Goal: Ability to manage health-related needs will improve Outcome: Progressing   Problem: Clinical Measurements: Goal: Ability to maintain clinical measurements within normal limits will improve Outcome: Progressing Goal: Will remain free from infection Outcome: Progressing Goal: Diagnostic test results will improve Outcome: Progressing Goal: Respiratory complications will improve Outcome: Progressing Goal: Cardiovascular complication will be avoided Outcome: Progressing   Problem: Activity: Goal: Risk for activity intolerance will decrease Outcome: Progressing   Problem: Nutrition: Goal: Adequate nutrition will be maintained Outcome: Progressing   Problem: Coping: Goal: Level of anxiety will decrease Outcome: Progressing   Problem: Elimination: Goal: Will not experience complications related to bowel motility Outcome: Progressing Goal: Will not experience complications related to urinary retention Outcome: Progressing   Problem: Pain Managment: Goal: General experience of comfort will improve and/or be controlled Outcome: Progressing   Problem: Safety: Goal: Ability to remain free from injury will improve Outcome: Progressing   Problem: Skin Integrity: Goal: Risk for impaired skin integrity will decrease Outcome: Progressing   Problem: Education: Goal: Ability to describe self-care measures that may prevent or decrease complications (Diabetes Survival Skills Education) will improve Outcome: Progressing Goal: Individualized Educational Video(s) Outcome: Progressing   Problem: Coping: Goal: Ability to adjust to condition or change in health will improve Outcome: Progressing   Problem: Fluid  Volume: Goal: Ability to maintain a balanced intake and output will improve Outcome: Progressing   Problem: Health Behavior/Discharge Planning: Goal: Ability to identify and utilize available resources and services will improve Outcome: Progressing Goal: Ability to manage health-related needs will improve Outcome: Progressing   Problem: Metabolic: Goal: Ability to maintain appropriate glucose levels will improve Outcome: Progressing   Problem: Nutritional: Goal: Maintenance of adequate nutrition will improve Outcome: Progressing Goal: Progress toward achieving an optimal weight will improve Outcome: Progressing   Problem: Skin Integrity: Goal: Risk for impaired skin integrity will decrease Outcome: Progressing   Problem: Tissue Perfusion: Goal: Adequacy of tissue perfusion will improve Outcome: Progressing   Problem: Education: Goal: Knowledge of General Education information will improve Description: Including pain rating scale, medication(s)/side effects and non-pharmacologic comfort measures Outcome: Progressing   Problem: Health Behavior/Discharge Planning: Goal: Ability to manage health-related needs will improve Outcome: Progressing   Problem: Clinical Measurements: Goal: Ability to maintain clinical measurements within normal limits will improve Outcome: Progressing Goal: Will remain free from infection Outcome: Progressing Goal: Diagnostic test results will improve Outcome: Progressing Goal: Respiratory complications will improve Outcome: Progressing Goal: Cardiovascular complication will be avoided Outcome: Progressing   Problem: Activity: Goal: Risk for activity intolerance will decrease Outcome: Progressing   Problem: Nutrition: Goal: Adequate nutrition will be maintained Outcome: Progressing   Problem: Coping: Goal: Level of anxiety will decrease Outcome: Progressing   Problem: Elimination: Goal: Will not experience complications related to  bowel motility Outcome: Progressing Goal: Will not experience complications related to urinary retention Outcome: Progressing   Problem: Pain Managment: Goal: General experience of comfort will improve and/or be controlled Outcome: Progressing   Problem: Safety: Goal: Ability to remain free from injury will improve Outcome: Progressing   Problem: Skin Integrity: Goal: Risk for impaired skin integrity will decrease Outcome: Progressing   Problem: Education: Goal: Ability to describe self-care measures that may prevent or decrease complications (Diabetes Survival Skills Education) will improve Outcome: Progressing Goal: Individualized Educational Video(s) Outcome: Progressing   Problem: Coping: Goal: Ability  to adjust to condition or change in health will improve Outcome: Progressing   Problem: Fluid Volume: Goal: Ability to maintain a balanced intake and output will improve Outcome: Progressing   Problem: Health Behavior/Discharge Planning: Goal: Ability to identify and utilize available resources and services will improve Outcome: Progressing Goal: Ability to manage health-related needs will improve Outcome: Progressing   Problem: Metabolic: Goal: Ability to maintain appropriate glucose levels will improve Outcome: Progressing   Problem: Nutritional: Goal: Maintenance of adequate nutrition will improve Outcome: Progressing Goal: Progress toward achieving an optimal weight will improve Outcome: Progressing   Problem: Skin Integrity: Goal: Risk for impaired skin integrity will decrease Outcome: Progressing   Problem: Tissue Perfusion: Goal: Adequacy of tissue perfusion will improve Outcome: Progressing

## 2024-04-28 DIAGNOSIS — N179 Acute kidney failure, unspecified: Secondary | ICD-10-CM | POA: Diagnosis not present

## 2024-04-28 DIAGNOSIS — E860 Dehydration: Secondary | ICD-10-CM | POA: Diagnosis not present

## 2024-04-28 DIAGNOSIS — K852 Alcohol induced acute pancreatitis without necrosis or infection: Secondary | ICD-10-CM | POA: Diagnosis not present

## 2024-04-28 LAB — COMPREHENSIVE METABOLIC PANEL WITH GFR
ALT: 26 U/L (ref 0–44)
AST: 64 U/L — ABNORMAL HIGH (ref 15–41)
Albumin: 3.6 g/dL (ref 3.5–5.0)
Alkaline Phosphatase: 68 U/L (ref 38–126)
Anion gap: 13 (ref 5–15)
BUN: 11 mg/dL (ref 6–20)
CO2: 28 mmol/L (ref 22–32)
Calcium: 9.6 mg/dL (ref 8.9–10.3)
Chloride: 92 mmol/L — ABNORMAL LOW (ref 98–111)
Creatinine, Ser: 0.98 mg/dL (ref 0.44–1.00)
GFR, Estimated: >60 mL/min (ref >60)
Glucose, Bld: 134 mg/dL — ABNORMAL HIGH (ref 70–99)
Potassium: 3.7 mmol/L (ref 3.5–5.1)
Sodium: 133 mmol/L — ABNORMAL LOW (ref 135–145)
Total Bilirubin: 1.3 mg/dL — ABNORMAL HIGH (ref 0.0–1.2)
Total Protein: 7.4 g/dL (ref 6.5–8.1)

## 2024-04-28 LAB — GLUCOSE, CAPILLARY
Glucose-Capillary: 134 mg/dL — ABNORMAL HIGH (ref 70–99)
Glucose-Capillary: 152 mg/dL — ABNORMAL HIGH (ref 70–99)
Glucose-Capillary: 205 mg/dL — ABNORMAL HIGH (ref 70–99)
Glucose-Capillary: 169 mg/dL — ABNORMAL HIGH (ref 70–99)

## 2024-04-28 LAB — CULTURE, BLOOD (ROUTINE X 2)
Culture: NO GROWTH
Special Requests: ADEQUATE

## 2024-04-28 LAB — CBC
HCT: 32.2 % — ABNORMAL LOW (ref 36.0–46.0)
Hemoglobin: 10.9 g/dL — ABNORMAL LOW (ref 12.0–15.0)
MCH: 33.3 pg (ref 26.0–34.0)
MCHC: 33.9 g/dL (ref 30.0–36.0)
MCV: 98.5 fL (ref 80.0–100.0)
Platelets: 183 K/uL (ref 150–400)
RBC: 3.27 MIL/uL — ABNORMAL LOW (ref 3.87–5.11)
RDW: 13.1 % (ref 11.5–15.5)
WBC: 4.6 K/uL (ref 4.0–10.5)
nRBC: 0 % (ref 0.0–0.2)

## 2024-04-28 LAB — MAGNESIUM: Magnesium: 2.1 mg/dL (ref 1.7–2.4)

## 2024-04-28 MED ORDER — METOPROLOL SUCCINATE ER 50 MG PO TB24
50.0000 mg | ORAL_TABLET | Freq: Every day | ORAL | Status: DC
  Administered 2024-04-28 – 2024-04-29 (×2): 50 mg via ORAL
  Filled 2024-04-28 (×2): qty 1

## 2024-04-28 NOTE — TOC Progression Note (Signed)
 Transition of Care [facility]) - Progression Note    Patient Details  Name: [name] MRN: [id number] Date of Birth: Jun 05, 1976  [facility]) CM/SW Contact  Graves-Bigelow, Erminio Deems, RN Phone Number: 04/28/2024, 10:51 AM  Clinical Narrative: Patient was discussed in progression rounds. Outpatient PT has been arranged and CSW has provide outpatient resources. Inpatient Case Manager will continue to follow for additional disposition needs.   Expected Discharge Plan: OP Rehab Barriers to Discharge: No Barriers Identified  Expected Discharge Plan and Services   Discharge Planning Services: CM Consult Post Acute Care Choice: NA Living arrangements for the past 2 months: Single Family Home                   DME Agency: NA       HH Arranged: NA  Social Drivers of Health (SDOH) Interventions SDOH Screenings   Food Insecurity: No Food Insecurity (04/23/2024)  Housing: Low Risk  (04/23/2024)  Transportation Needs: No Transportation Needs (04/23/2024)  Utilities: Not At Risk (04/23/2024)  Depression (PHQ2-9): Medium Risk (02/19/2023)  Tobacco Use: Medium Risk (04/23/2024)    Readmission Risk Interventions     No data to display

## 2024-04-28 NOTE — Plan of Care (Signed)

## 2024-04-28 NOTE — Progress Notes (Signed)
 Physical Therapy Treatment Patient Details Name: [name] MRN: [id number] DOB: 1976/02/22 Today's Date: 04/28/2024   History of Present Illness 48 yo F adm 04/23/24 with Abd pain, vomiting, alcoholic pancreatitis. PMhx: HTN, GERD, ETOH abuse, AKI, breast CA, ADD, tremor.    PT Comments  PT received in supine, agreeable to therapy session with encouragement, pt reports she did ambulate in AM. Pt with improvements toward PT goals this session, mostly modI for transfers and gait, needing Supervision at most for stair negotiation with single rail. HR to 122 bpm with exertion, pt asymptomatic. Reviewed ambulation TID at least with supervision for safety and discussed standing mini squats exercise as pt reports feeling constipated, discussed how increased standing/gait may help to reduce gas related discomfort/encourage intestinal motility. Pt continues to benefit from PT services to progress toward functional mobility goals, supervising PT Maija P notified of pt progress toward PT goals.     If plan is discharge home, recommend the following: A little help with walking and/or transfers;Assistance with cooking/housework;Direct supervision/assist for financial management;Assist for transportation   Can travel by private vehicle        Equipment Recommendations  Rolling walker (2 wheels);BSC/3in1 (pt defers at this time, progressing well)    Recommendations for Other Services       Precautions / Restrictions Precautions Precautions: Fall;Other (comment) Recall of Precautions/Restrictions: Impaired Precaution/Restrictions Comments: tachy HR Restrictions Weight Bearing Restrictions Per Provider Order: No     Mobility  Bed Mobility Overal bed mobility: Modified Independent                  Transfers Overall transfer level: Needs assistance Equipment used: None Transfers: Sit to/from Stand Sit to Stand: Supervision           General transfer comment: to/from EOB,  no LOB or buckling    Ambulation/Gait Ambulation/Gait assistance: Modified independent (Device/Increase time) Gait Distance (Feet): 300 Feet Assistive device: None Gait Pattern/deviations: Wide base of support, Decreased stride length       General Gait Details: Slightly decreased cadence/stride length but no LOB without AD and improved stability. hr to 122 bpm with exertion, pt needs cues for activity pacing/checking monitor intermittently with activity as she was frustrated with still having to wear it. No weakness or nausea or dizziness with activity.   Stairs Stairs: Yes Stairs assistance: Supervision Stair Management: One rail Right, Alternating pattern, Forwards Number of Stairs: 5 General stair comments: pt descended/ascended 5 steps in stairwell with R rail descending/L rail ascending with no buckling, mildly decreased stability descending but no buckling or significant LOB with rail use, PTA encouraging step-to pattern when descending on her own for safety.   Wheelchair Mobility     Tilt Bed    Modified Rankin (Stroke Patients Only)       Balance Overall balance assessment: Needs assistance, History of Falls Sitting-balance support: No upper extremity supported, Feet supported Sitting balance-Leahy Scale: Good Sitting balance - Comments: EOB   Standing balance support: During functional activity, No upper extremity supported Standing balance-Leahy Scale: Fair Standing balance comment: no AD and no LOB                            Communication Communication Communication: No apparent difficulties  Cognition Arousal: Alert Behavior During Therapy: Flat affect   PT - Cognitive impairments: No apparent impairments  PT - Cognition Comments: agreeable to participate with encouragement, needed instruction on why heart monitor was important, pt frustrated that she has to keep wearing it. Following commands: Intact       Cueing Cueing Techniques: Verbal cues  Exercises Other Exercises Other Exercises: standing mini squats x10 in hallway with R rail for safety as pt reports hasn't had BM recently, reinforced benefits of ambulation multiple times a day to encourage intestinal motility    General Comments General comments (skin integrity, edema, etc.): HR 83 bpm sitting, to 122 bpm with exertion; Some heart monitor stickers replaced sitting EOB prior to trial.      Pertinent Vitals/Pain Pain Assessment Pain Assessment: No/denies pain    Home Living                          Prior Function            PT Goals (current goals can now be found in the care plan section) Acute Rehab PT Goals Patient Stated Goal: return home, decrease drinking PT Goal Formulation: With patient/family Time For Goal Achievement: 05/08/24 Progress towards PT goals: Progressing toward goals    Frequency    Min 2X/week      PT Plan      Co-evaluation              AM-PAC PT 6 Clicks Mobility   Outcome Measure  Help needed turning from your back to your side while in a flat bed without using bedrails?: None Help needed moving from lying on your back to sitting on the side of a flat bed without using bedrails?: None Help needed moving to and from a bed to a chair (including a wheelchair)?: None Help needed standing up from a chair using your arms (e.g., wheelchair or bedside chair)?: None Help needed to walk in hospital room?: A Little Help needed climbing 3-5 steps with a railing? : A Little 6 Click Score: 22    End of Session Equipment Utilized During Treatment: Other (comment) (pt defers gait belt) Activity Tolerance: Patient tolerated treatment well Patient left: in bed;with call bell/phone within reach;with family/visitor present (pt sitting EOB; spouse in room) Nurse Communication: Mobility status PT Visit Diagnosis: Other abnormalities of gait and mobility (R26.89);Difficulty in walking,  not elsewhere classified (R26.2);History of falling (Z91.81)     Time: 8365-8355 PT Time Calculation (min) (ACUTE ONLY): 10 min  Charges:    $Gait Training: 8-22 mins PT General Charges $$ ACUTE PT VISIT: 1 Visit                     Elcie Pelster P., PTA Acute Rehabilitation Services Secure Chat Preferred 9a-5:30pm Office: [phone]    Connell HERO [facility] 04/28/2024, 4:59 PM

## 2024-04-28 NOTE — Plan of Care (Signed)

## 2024-04-28 NOTE — TOC Progression Note (Addendum)
 Transition of Care [facility]) - Progression Note    Patient Details  Name: [name] MRN: [id number] Date of Birth: 1976-02-03  Transition of Care [facility]) CM/SW Contact  Isaiah Public, LCSWA Phone Number: 04/28/2024, 3:47 PM  Clinical Narrative:     CSW received call from patients spouse who informed CSW that patient is interested in [place]. Patients spouse spoke with [facility] and they are requesting clinicals to review. Patient gave CSW permission to fax over clinicals for [facility] to review.CSW spoke with [place] with [facility] who provided CSW with fax number to send over referral/clinicals for review [phone]. CSW faxed over clinicals requested.CSW updated patients spouse BJ.   Expected Discharge Plan: OP Rehab Barriers to Discharge: No Barriers Identified               Expected Discharge Plan and Services   Discharge Planning Services: CM Consult Post Acute Care Choice: NA Living arrangements for the past 2 months: Single Family Home                   DME Agency: NA       HH Arranged: NA           Social Drivers of Health (SDOH) Interventions SDOH Screenings   Food Insecurity: No Food Insecurity (04/23/2024)  Housing: Low Risk  (04/23/2024)  Transportation Needs: No Transportation Needs (04/23/2024)  Utilities: Not At Risk (04/23/2024)  Depression (PHQ2-9): Medium Risk (02/19/2023)  Tobacco Use: Medium Risk (04/23/2024)    Readmission Risk Interventions     No data to display

## 2024-04-28 NOTE — Progress Notes (Signed)
 Progress Note   Patient: [name] FMW:[id number] DOB: Jul 03, 1976 DOA: 04/23/2024     5 DOS: the patient was seen and examined on 04/28/2024   Brief hospital course: 48 y.o. female with medical history significant of hypertension, history of breast cancer, alcohol abuse, and GERD presents with abdominal pain and severe vomiting. Admitted for presumed alcoholic pancreatitis  Assessment and Plan: Ileus  Suspected alcoholic pancreatitis Acute.  Patient presents with complaints of severe epigastric abdominal pain with nausea and vomiting.  Reports drinking vodka on a daily basis.  CT scan of the abdomen pelvis noted acute pancreatitis without signs of abscess or pseudocyst.  Suspecting pancreatitis secondary to alcohol abuse. - Lipase improved to just over 100, LFT's improved - Now with resolving ileus likely secondary to presenting pancreatitis. Advance diet as tolerated, encourage ambulation -Thus far, unable to tolerate past full liquids this AM.  - Encourage ambulation as tolerated. Slowly improving   Leukocytosis Acute.  WBC elevated 12.7.  Thought secondary to above. - not having urinary symptoms -lower chest clear on CT abd.  -WBC normalized. Likely reactive   Acute kidney injury Patient presents with creatinine elevated up to 3.6 now with BUN 51.  Baseline creatinine previously noted to be around 0.5-0.6.    - UA with >300 protein  - CT abd with unremarkable looking kidneys -Cr improving with hydration   Fall at home Prior to arrival.  Patient reports having a fall approximately 2 weeks ago where she fell and hit her face at home.  She was not evaluated for the fall. - CT head reviewed, neg -PT/OT consulted. Recs for outpt PTOT   Hyperglycemia Acute.  On admission glucose noted to be elevated at 180. - Hypoglycemia protocols - Check hemoglobin A1c - CBGs before every meal with very sensitive SSI - Adjust regimen as deemed medically appropriate   Metabolic  acidosis with increased anion gap CO2 noted to be 9 with anion gap of 34.  Thought secondary to alcoholic ketoacidosis. -resolved with fluids   Alcohol abuse Patient reports drinking 5 mixed drinks of vodka per day on average with last drink yesterday. - CIWA protocols initiated with Ativan  - Thiamine , multivitamin, folic acid  - Transitions of care consulted for alcohol abuse   Elevated liver function test AST elevated at 96 and total bilirubin 3.6.  Thought secondary to patient's history of alcohol abuse.  CT noted patient have enlarged fatty liver suggestive of steatohepatitis. -alk phos normal - Continue to monitor LFTs -ETOH cessation done at bedside -RUQ US  unremarkable   Essential hypertension Blood pressures noted to be maintained at this time. - Held home blood pressure regimen.  Reassess and determine when medically appropriate to resume medications such as metoprolol    History of breast cancer Patient is currently receiving Zoladex  every 28 days. - Continue anastrozole  - Continue outpatient follow-up with Dr. Loretha of oncology   Thrombocytopenia Acute on chronic.   -Now improving   Nicotine vape use Patient reports vaping nicotine products on a regular basis. - Counseled on the need of cessation of nicotine vape use   GERD - Substituted Protonix  IV      Subjective: Complains of increased bloating, decreased flatus, not tolerating full liquids very well  Physical Exam: Vitals:   04/27/24 2334 04/28/24 0431 04/28/24 0825 04/28/24 1147  BP: 122/88 (!) 126/92 (!) 127/93 (!) 130/91  Pulse: 87 80 79 75  Resp: 18 16 17 16   Temp: 98.6 F (37 C) 99.4 F (37.4 C) 98.4 F (36.9  C) 98.4 F (36.9 C)  TempSrc: Oral Oral Oral Oral  SpO2: 100% 100%  100%  Weight:      Height:       .trhs  Data Reviewed:  Labs reviewed: Na 133, K 3.7, Cr 0.98, WBC 4.6, Hgb 10.9, Plts 183  Family Communication: Pt in room, family at bedside  Disposition: Status is:  Inpatient Remains inpatient appropriate because: severity of illness  Planned Discharge Destination: Home     Author: Garnette Pelt, MD 04/28/2024 4:43 PM  For on call review [email or web address].

## 2024-04-29 DIAGNOSIS — N179 Acute kidney failure, unspecified: Secondary | ICD-10-CM | POA: Diagnosis not present

## 2024-04-29 DIAGNOSIS — E860 Dehydration: Secondary | ICD-10-CM | POA: Diagnosis not present

## 2024-04-29 DIAGNOSIS — K852 Alcohol induced acute pancreatitis without necrosis or infection: Secondary | ICD-10-CM | POA: Diagnosis not present

## 2024-04-29 LAB — GLUCOSE, CAPILLARY
Glucose-Capillary: 134 mg/dL — ABNORMAL HIGH (ref 70–99)
Glucose-Capillary: 183 mg/dL — ABNORMAL HIGH (ref 70–99)

## 2024-04-29 LAB — CBC
HCT: 32.4 % — ABNORMAL LOW (ref 36.0–46.0)
Hemoglobin: 10.9 g/dL — ABNORMAL LOW (ref 12.0–15.0)
MCH: 33.1 pg (ref 26.0–34.0)
MCHC: 33.6 g/dL (ref 30.0–36.0)
MCV: 98.5 fL (ref 80.0–100.0)
Platelets: 244 K/uL (ref 150–400)
RBC: 3.29 MIL/uL — ABNORMAL LOW (ref 3.87–5.11)
RDW: 13.2 % (ref 11.5–15.5)
WBC: 5.4 K/uL (ref 4.0–10.5)
nRBC: 0 % (ref 0.0–0.2)

## 2024-04-29 LAB — COMPREHENSIVE METABOLIC PANEL WITH GFR
ALT: 26 U/L (ref 0–44)
AST: 65 U/L — ABNORMAL HIGH (ref 15–41)
Albumin: 3.6 g/dL (ref 3.5–5.0)
Alkaline Phosphatase: 73 U/L (ref 38–126)
Anion gap: 11 (ref 5–15)
BUN: 8 mg/dL (ref 6–20)
CO2: 29 mmol/L (ref 22–32)
Calcium: 9.5 mg/dL (ref 8.9–10.3)
Chloride: 93 mmol/L — ABNORMAL LOW (ref 98–111)
Creatinine, Ser: 0.96 mg/dL (ref 0.44–1.00)
GFR, Estimated: >60 mL/min (ref >60)
Glucose, Bld: 139 mg/dL — ABNORMAL HIGH (ref 70–99)
Potassium: 3.8 mmol/L (ref 3.5–5.1)
Sodium: 133 mmol/L — ABNORMAL LOW (ref 135–145)
Total Bilirubin: 1.3 mg/dL — ABNORMAL HIGH (ref 0.0–1.2)
Total Protein: 7.7 g/dL (ref 6.5–8.1)

## 2024-04-29 LAB — MAGNESIUM: Magnesium: 1.6 mg/dL — ABNORMAL LOW (ref 1.7–2.4)

## 2024-04-29 MED ORDER — POLYETHYLENE GLYCOL 3350 17 G PO PACK
17.0000 g | PACK | Freq: Two times a day (BID) | ORAL | Status: DC
  Administered 2024-04-29: 17 g via ORAL
  Filled 2024-04-29 (×2): qty 1

## 2024-04-29 MED ORDER — MAGNESIUM SULFATE 4 GM/100ML IV SOLN
4.0000 g | Freq: Once | INTRAVENOUS | Status: AC
  Administered 2024-04-29: 4 g via INTRAVENOUS
  Filled 2024-04-29: qty 100

## 2024-04-29 MED ORDER — PANTOPRAZOLE SODIUM 40 MG PO TBEC
40.0000 mg | DELAYED_RELEASE_TABLET | Freq: Two times a day (BID) | ORAL | Status: DC
  Administered 2024-04-29: 40 mg via ORAL
  Filled 2024-04-29: qty 1

## 2024-04-29 MED ORDER — ENOXAPARIN SODIUM 40 MG/0.4ML IJ SOSY
40.0000 mg | PREFILLED_SYRINGE | INTRAMUSCULAR | Status: DC
  Administered 2024-04-29: 40 mg via SUBCUTANEOUS
  Filled 2024-04-29: qty 0.4

## 2024-04-29 NOTE — Progress Notes (Signed)
 [facility] PROGRESS NOTE    Progress Note  [name]  FMW:[id number] DOB: 12/29/1975 DOA: 04/23/2024 PCP: Antonio Cyndee [name], DO     Brief Narrative:   [name] is an 48 y.o. female past medical history of essential hypertension history of breast cancer alcohol abuse comes in with abdominal pain and vomiting likely due to alcoholic pancreatitis   Assessment/Plan:   Alcoholic pancreatitis/leukocytosis CT scan of the abdomen pelvis showed acute pancreatitis no active pseudocyst. She was treated with conservative management IV fluids pain medication p.o. Lipase and LFTs are improved. She did an ileus which is now improved Feeding regular diet, her abdomen looks distended she relates that she feels bloated.  Has not had a bowel movement in 5 days  Acute kidney injury: Likely hemodynamically mediated creatinine has returned to baseline.  Constipation: Started on MiraLAX  p.o. twice daily when she has not had a bowel movement in over 5 days.  Fall at home: PT evaluated the patient recommended outpatient PT.  Hyperglycemia: Likely reactive, with an A1c of 5.4 requiring minimal insulin .  Anion gap metabolic acidosis: Likely due to alcoholic ketosis now resolved.  Alcohol abuse: Continue thiamine  and folate and CIWA protocol.  Elevated LFTs: Due to alcohol abuse. She has been counseled right upper quadrant ultrasound unremarkable. CT scan showed hepatic steatosis.  Essential hypertension Continue hold antihypertensive medication, Blood pressures relatively controlled.  History of breast cancer: Follow-up with oncology as an outpatient continue anastrozole .  Acute thrombocytopenia: Now resolved.   DVT prophylaxis: lovenox  Family Communication:none Status is: Inpatient Remains inpatient appropriate because: Alcoholic pancreatitis    Code Status:     Code Status Orders  (From admission, onward)           Start      Ordered   04/23/24 1603  Full code  Continuous       Question:  By:  Answer:  Consent: discussion documented in EHR   04/23/24 1608           Code Status History     Date Active Date Inactive Code Status Order ID Comments User Context   11/19/2022 2148 11/20/2022 1612 Full Code [id number]  Dasie Leonor CROME, MD Inpatient         IV Access:   Peripheral IV   Procedures and diagnostic studies:   DG Abd 1 View Result Date: 04/27/2024 EXAM: 1 VIEW XRAY OF THE ABDOMEN 04/27/2024 02:31:00 PM COMPARISON: CT 04/23/2024 CLINICAL HISTORY: Ileus FINDINGS: BOWEL: Increased air within the colon and central small bowel. No evidence of free air. SOFT TISSUES: No opaque urinary calculi. Prominent right liver lobe. BONES: No acute osseous abnormality. IMPRESSION: 1. Increased air in the colon and central small bowel, typical of ileus. Electronically signed by: Andrea Gasman MD 04/27/2024 05:45 PM EDT RP Workstation: [id number]   US  Abdomen Limited RUQ (LIVER/GB) Result Date: 04/27/2024 CLINICAL DATA:  [id number] Symptomatic cholelithiasis [id number] EXAM: ULTRASOUND ABDOMEN LIMITED RIGHT UPPER QUADRANT COMPARISON:  04/23/2024, 10/21/2023 FINDINGS: Gallbladder: No gallstones. No wall thickening or pericholecystic fluid. No sonographic Murphy's sign noted by sonographer. Common bile duct: Diameter: 3 mm Liver: Increased echogenicity. No focal lesion identified. No intrahepatic biliary ductal dilation. Portal vein is patent on color Doppler imaging with normal direction of blood flow towards the liver. Other: None. IMPRESSION: 1. No cholecystolithiasis or changes of acute cholecystitis. 2. Hepatic steatosis. Electronically Signed   By: Rogelia Myers M.D.   On: 04/27/2024 17:13     Medical Consultants:   None.  Subjective:    [name] She feels better some nausea with eating and feels bloated  Objective:    Vitals:   04/29/24 0000 04/29/24 0322 04/29/24 0738 04/29/24 0857  BP:  (!) 124/90 124/88 (!) 123/94 120/87  Pulse:  79 75 80  Resp: 17 16 16    Temp: 98.7 F (37.1 C) 98.8 F (37.1 C) 98.7 F (37.1 C)   TempSrc: Oral Oral Oral   SpO2: 99% 100% 96%   Weight:      Height:       SpO2: 96 %   Intake/Output Summary (Last 24 hours) at 04/29/2024 1036 Last data filed at 04/29/2024 0911 Gross per 24 hour  Intake 610 ml  Output 200 ml  Net 410 ml   Filed Weights   04/23/24 1158  Weight: 61.7 kg    Exam: General exam: In no acute distress. Respiratory system: Good air movement and clear to auscultation. Cardiovascular system: S1 & S2 heard, RRR. No JVD. Gastrointestinal system: Abdomen is nondistended, soft and nontender.  Extremities: No pedal edema. Skin: No rashes, lesions or ulcers Psychiatry: Judgement and insight appear normal. Mood & affect appropriate.    Data Reviewed:    Labs: Basic Metabolic Panel: Recent Labs  Lab 04/23/24 1805 04/24/24 0441 04/25/24 0439 04/26/24 0323 04/27/24 0607 04/28/24 0428 04/29/24 0506  NA  --    < > 128* 131* 133* 133* 133*  K  --    < > 3.5 3.2* 4.2 3.7 3.8  CL  --    < > 94* 97* 97* 92* 93*  CO2  --    < > 20* 21* 26 28 29   GLUCOSE  --    < > 180* 141* 155* 134* 139*  BUN  --    < > 36* 25* 17 11 8   CREATININE  --    < > 2.34* 1.74* 1.23* 0.98 0.96  CALCIUM  --    < > 8.8* 9.1 9.5 9.6 9.5  MG 1.6*  --  2.4  --  1.5* 2.1 1.6*  PHOS 2.6  --   --   --   --   --   --    < > = values in this interval not displayed.   GFR Estimated Creatinine Clearance: 59.9 mL/min (by C-G formula based on SCr of 0.96 mg/dL). Liver Function Tests: Recent Labs  Lab 04/25/24 0439 04/26/24 0323 04/27/24 0607 04/28/24 0428 04/29/24 0506  AST 78* 82* 74* 64* 65*  ALT 26 27 26 26 26   ALKPHOS 54 59 70 68 73  BILITOT 1.5* 1.7* 1.5* 1.3* 1.3*  PROT 7.0 7.2 7.1 7.4 7.7  ALBUMIN  3.6 3.5 3.4* 3.6 3.6   Recent Labs  Lab 04/23/24 1209 04/27/24 0607  LIPASE >2,800* 108*   No results for input(s): AMMONIA in  the last 168 hours. Coagulation profile Recent Labs  Lab 04/24/24 0851  INR 1.2   COVID-19 Labs  No results for input(s): DDIMER, FERRITIN, LDH, CRP in the last 72 hours.  Lab Results  Component Value Date   SARSCOV2NAA NEGATIVE 03/24/2019   SARSCOV2NAA NEGATIVE 01/09/2019    CBC: Recent Labs  Lab 04/25/24 0439 04/26/24 0323 04/27/24 0607 04/28/24 0428 04/29/24 0506  WBC 4.8 5.9 4.2 4.6 5.4  HGB 10.5* 10.6* 10.8* 10.9* 10.9*  HCT 30.6* 30.2* 31.8* 32.2* 32.4*  MCV 97.1 97.7 99.1 98.5 98.5  PLT 78* 114* 123* 183 244   Cardiac Enzymes: No results for input(s): CKTOTAL, CKMB, CKMBINDEX, TROPONINI  in the last 168 hours. BNP (last 3 results) No results for input(s): PROBNP in the last 8760 hours. CBG: Recent Labs  Lab 04/28/24 0828 04/28/24 1146 04/28/24 1704 04/28/24 2101 04/29/24 0736  GLUCAP 134* 152* 205* 169* 134*   D-Dimer: No results for input(s): DDIMER in the last 72 hours. Hgb A1c: No results for input(s): HGBA1C in the last 72 hours. Lipid Profile: No results for input(s): CHOL, HDL, LDLCALC, TRIG, CHOLHDL, LDLDIRECT in the last 72 hours. Thyroid function studies: No results for input(s): TSH, T4TOTAL, T3FREE, THYROIDAB in the last 72 hours.  Invalid input(s): FREET3 Anemia work up: No results for input(s): VITAMINB12, FOLATE, FERRITIN, TIBC, IRON, RETICCTPCT in the last 72 hours. Sepsis Labs: Recent Labs  Lab 04/23/24 1528 04/24/24 0441 04/26/24 0323 04/27/24 0607 04/28/24 0428 04/29/24 0506  WBC  --    < > 5.9 4.2 4.6 5.4  LATICACIDVEN 2.0*  --   --   --   --   --    < > = values in this interval not displayed.   Microbiology Recent Results (from the past 240 hours)  Culture, blood (routine x 2)     Status: None   Collection Time: 04/23/24  3:21 PM   Specimen: BLOOD  Result Value Ref Range Status   Specimen Description BLOOD SITE NOT SPECIFIED  Final   Special Requests   Final     BOTTLES DRAWN AEROBIC AND ANAEROBIC Blood Culture adequate volume   Culture   Final    NO GROWTH 5 DAYS Performed at [facility], [address] [place], [place] [address]    Report Status 04/28/2024 FINAL  Final     Medications:    anastrozole   1 mg Oral Daily   docusate sodium   100 mg Oral BID   feeding supplement  237 mL Oral BID BM   folic acid   1 mg Oral Daily   insulin  aspart  0-5 Units Subcutaneous QHS   insulin  aspart  0-9 Units Subcutaneous TID WC   metoprolol  succinate  50 mg Oral Daily   multivitamin with minerals  1 tablet Oral Daily   pantoprazole  (PROTONIX ) IV  40 mg Intravenous Q12H   sodium chloride  flush  3 mL Intravenous Q12H   thiamine   100 mg Oral Daily   Or   thiamine   100 mg Intravenous Daily   Continuous Infusions:    LOS: 6 days   Erle Odell Castor  [facility]  04/29/2024, 10:36 AM

## 2024-04-29 NOTE — Progress Notes (Signed)
 Occupational Therapy Treatment & Discharge Patient Details Name: [name] MRN: [id number] DOB: April 17, 1976 Today's Date: 04/29/2024   History of present illness 48 yo F adm 04/23/24 with Abd pain, vomiting, alcoholic pancreatitis. PMhx: HTN, GERD, ETOH abuse, AKI, breast CA, ADD, tremor.   OT comments  Pt tolerating treatment well. Progressed balance and functional mobility without an AD. Improved standing balance for ADLs to mod I. Educated pt on tub shower transfers, pt able to return demo understanding. Educated pt on compensatory strategies for ADLs while experiencing abdominal pain, pt verbalizing understanding of all education provided. Pt reporting no further concerns for ADLs upon d/c. All goals met, acute OT is signing off on this pt.       If plan is discharge home, recommend the following:  A little help with walking and/or transfers;A little help with bathing/dressing/bathroom;Assistance with cooking/housework   Equipment Recommendations  Tub/shower seat       Precautions / Restrictions Precautions Precautions: Fall;Other (comment) Recall of Precautions/Restrictions: Intact Restrictions Weight Bearing Restrictions Per Provider Order: No       Mobility Bed Mobility Overal bed mobility: Modified Independent      Transfers Overall transfer level: Needs assistance Equipment used: None Transfers: Sit to/from Stand Sit to Stand: Supervision           General transfer comment: Pt supervision to mod I with functional transfers ambulatiing ~367ft no AD     Balance Overall balance assessment: Needs assistance, History of Falls Sitting-balance support: No upper extremity supported, Feet supported Sitting balance-Leahy Scale: Good     Standing balance support: During functional activity, No upper extremity supported Standing balance-Leahy Scale: Fair         ADL either performed or assessed with clinical judgement   ADL Overall ADL's : Modified  independent       General ADL Comments: Improved standing balance for standing ADLs, educated on compensatory strategies for abdominal pain, demo return of shower transfer    Extremity/Trunk Assessment Upper Extremity Assessment Upper Extremity Assessment: Generalized weakness   Lower Extremity Assessment Lower Extremity Assessment: Defer to PT evaluation        Vision   Vision Assessment?: No apparent visual deficits         Communication Communication Communication: No apparent difficulties   Cognition Arousal: Alert Behavior During Therapy: Flat affect Cognition: No apparent impairments     OT - Cognition Comments: Delayed processing       Following commands: Intact        Cueing   Cueing Techniques: Verbal cues        General Comments VSS on RA    Pertinent Vitals/ Pain       Pain Assessment Pain Assessment: No/denies pain   Frequency  Min 2X/week        Progress Toward Goals  OT Goals(current goals can now be found in the care plan section)  Progress towards OT goals: Progressing toward goals  Acute Rehab OT Goals Patient Stated Goal: To get better OT Goal Formulation: With patient Time For Goal Achievement: 05/09/24 Potential to Achieve Goals: Good ADL Goals Pt Will Perform Grooming: with modified independence;standing Pt Will Perform Lower Body Dressing: with modified independence;sit to/from stand Pt Will Transfer to Toilet: with modified independence;ambulating;regular height toilet Pt Will Perform Toileting - Clothing Manipulation and hygiene: with modified independence;sitting/lateral leans;sit to/from stand Pt Will Perform Tub/Shower Transfer: with modified independence;shower seat  Plan         AM-PAC OT 6 Clicks Daily  Activity     Outcome Measure   Help from another person eating meals?: None Help from another person taking care of personal grooming?: None Help from another person toileting, which includes using toliet,  bedpan, or urinal?: None Help from another person bathing (including washing, rinsing, drying)?: None Help from another person to put on and taking off regular upper body clothing?: None Help from another person to put on and taking off regular lower body clothing?: None 6 Click Score: 24    End of Session    OT Visit Diagnosis: Unsteadiness on feet (R26.81);Other abnormalities of gait and mobility (R26.89);Muscle weakness (generalized) (M62.81);Repeated falls (R29.6);History of falling (Z91.81)   Activity Tolerance Patient tolerated treatment well   Patient Left in bed;with call bell/phone within reach   Nurse Communication Mobility status        Time: 9046-8992 OT Time Calculation (min): 14 min  Charges: OT General Charges $OT Visit: 1 Visit OT Treatments $Self Care/Home Management : 8-22 mins  Adrianne BROCKS, OT  Acute Rehabilitation Services Office [phone] Secure chat preferred  Adrianne GORMAN Savers 04/29/2024, 10:45 AM

## 2024-04-29 NOTE — TOC Progression Note (Addendum)
 Transition of Care [facility]) - Progression Note    Patient Details  Name: [name] MRN: [id number] Date of Birth: 11/13/1975  [facility] CM/SW Contact  Isaiah Public, LCSWA Phone Number: 04/29/2024, 3:48 PM  Clinical Narrative:     CSW followed up with Nanetta with [facility] on referral status for patient. Nanetta informed CSW that next steps will be for patient to call and complete prescreen. She said once prescreen is complete that they will be in touch on referral determination. CSW informed patients spouse [name]. No further questions reported at this time.   Expected Discharge Plan: OP Rehab Barriers to Discharge: No Barriers Identified               Expected Discharge Plan and Services   Discharge Planning Services: CM Consult Post Acute Care Choice: NA Living arrangements for the past 2 months: Single Family Home                   DME Agency: NA       HH Arranged: NA           Social Drivers of Health (SDOH) Interventions SDOH Screenings   Food Insecurity: No Food Insecurity (04/23/2024)  Housing: Low Risk  (04/23/2024)  Transportation Needs: No Transportation Needs (04/23/2024)  Utilities: Not At Risk (04/23/2024)  Depression (PHQ2-9): Medium Risk (02/19/2023)  Tobacco Use: Medium Risk (04/23/2024)    Readmission Risk Interventions     No data to display

## 2024-04-29 NOTE — Plan of Care (Signed)
  Problem: Education: Goal: Knowledge of General Education information will improve Description: Including pain rating scale, medication(s)/side effects and non-pharmacologic comfort measures Outcome: Progressing   Problem: Health Behavior/Discharge Planning: Goal: Ability to manage health-related needs will improve Outcome: Progressing   Problem: Clinical Measurements: Goal: Ability to maintain clinical measurements within normal limits will improve Outcome: Not Progressing Goal: Will remain free from infection Outcome: Progressing   Problem: Activity: Goal: Risk for activity intolerance will decrease Outcome: Progressing   Problem: Nutrition: Goal: Adequate nutrition will be maintained Outcome: Progressing   Problem: Coping: Goal: Level of anxiety will decrease Outcome: Progressing   Problem: Safety: Goal: Ability to remain free from injury will improve Outcome: Progressing   Problem: Skin Integrity: Goal: Risk for impaired skin integrity will decrease Outcome: Progressing

## 2024-04-30 DIAGNOSIS — N179 Acute kidney failure, unspecified: Secondary | ICD-10-CM | POA: Diagnosis not present

## 2024-04-30 DIAGNOSIS — E860 Dehydration: Secondary | ICD-10-CM | POA: Diagnosis not present

## 2024-04-30 DIAGNOSIS — E872 Acidosis, unspecified: Secondary | ICD-10-CM | POA: Diagnosis not present

## 2024-04-30 DIAGNOSIS — K852 Alcohol induced acute pancreatitis without necrosis or infection: Secondary | ICD-10-CM | POA: Diagnosis not present

## 2024-04-30 MED ORDER — PANTOPRAZOLE SODIUM 40 MG PO TBEC: 40.0000 mg | DELAYED_RELEASE_TABLET | Freq: Every day | ORAL | 0 refills | Status: DC

## 2024-04-30 NOTE — Plan of Care (Signed)
 Problem: Education: Goal: Knowledge of General Education information will improve Description: Including pain rating scale, medication(s)/side effects and non-pharmacologic comfort measures 04/30/2024 0819 by Gladis Dania HERO, RN Outcome: Adequate for Discharge 04/30/2024 [phone] by Gladis Dania HERO, RN Outcome: Adequate for Discharge   Problem: Health Behavior/Discharge Planning: Goal: Ability to manage health-related needs will improve 04/30/2024 0819 by Gladis Dania HERO, RN Outcome: Adequate for Discharge 04/30/2024 [phone] by Gladis Dania HERO, RN Outcome: Adequate for Discharge   Problem: Clinical Measurements: Goal: Ability to maintain clinical measurements within normal limits will improve 04/30/2024 0819 by Gladis Dania HERO, RN Outcome: Adequate for Discharge 04/30/2024 [phone] by Gladis Dania HERO, RN Outcome: Adequate for Discharge Goal: Will remain free from infection 04/30/2024 0819 by Gladis Dania HERO, RN Outcome: Adequate for Discharge 04/30/2024 [phone] by Gladis Dania HERO, RN Outcome: Adequate for Discharge Goal: Diagnostic test results will improve 04/30/2024 0819 by Gladis Dania HERO, RN Outcome: Adequate for Discharge 04/30/2024 [phone] by Gladis Dania HERO, RN Outcome: Adequate for Discharge Goal: Respiratory complications will improve 04/30/2024 0819 by Gladis Dania HERO, RN Outcome: Adequate for Discharge 04/30/2024 [phone] by Gladis Dania HERO, RN Outcome: Adequate for Discharge Goal: Cardiovascular complication will be avoided 04/30/[phone] by Gladis Dania HERO, RN Outcome: Adequate for Discharge 04/30/2024 [phone] by Gladis Dania HERO, RN Outcome: Adequate for Discharge   Problem: Activity: Goal: Risk for activity intolerance will decrease 04/30/2024 0819 by Gladis Dania HERO, RN Outcome: Adequate for Discharge 04/30/2024 [phone] by Gladis Dania HERO, RN Outcome: Adequate for Discharge   Problem: Nutrition: Goal: Adequate nutrition will be maintained 04/30/2024 0819 by Gladis Dania HERO, RN Outcome:  Adequate for Discharge 04/30/2024 [phone] by Gladis Dania HERO, RN Outcome: Adequate for Discharge   Problem: Coping: Goal: Level of anxiety will decrease 04/30/2024 0819 by Gladis Dania HERO, RN Outcome: Adequate for Discharge 04/30/2024 [phone] by Gladis Dania HERO, RN Outcome: Adequate for Discharge   Problem: Elimination: Goal: Will not experience complications related to bowel motility 04/30/2024 0819 by Gladis Dania HERO, RN Outcome: Adequate for Discharge 04/30/2024 [phone] by Gladis Dania HERO, RN Outcome: Adequate for Discharge Goal: Will not experience complications related to urinary retention 04/30/2024 0819 by Gladis Dania HERO, RN Outcome: Adequate for Discharge 04/30/2024 [phone] by Gladis Dania HERO, RN Outcome: Adequate for Discharge   Problem: Pain Managment: Goal: General experience of comfort will improve and/or be controlled 04/30/2024 0819 by Gladis Dania HERO, RN Outcome: Adequate for Discharge 04/30/2024 [phone] by Gladis Dania HERO, RN Outcome: Adequate for Discharge   Problem: Safety: Goal: Ability to remain free from injury will improve 04/30/2024 0819 by Gladis Dania HERO, RN Outcome: Adequate for Discharge 04/30/2024 [phone] by Gladis Dania HERO, RN Outcome: Adequate for Discharge   Problem: Skin Integrity: Goal: Risk for impaired skin integrity will decrease 04/30/2024 0819 by Gladis Dania HERO, RN Outcome: Adequate for Discharge 04/30/2024 [phone] by Gladis Dania HERO, RN Outcome: Adequate for Discharge   Problem: Education: Goal: Ability to describe self-care measures that may prevent or decrease complications (Diabetes Survival Skills Education) will improve 04/30/2024 0819 by Gladis Dania HERO, RN Outcome: Adequate for Discharge 04/30/2024 [phone] by Gladis Dania HERO, RN Outcome: Adequate for Discharge Goal: Individualized Educational Video(s) 04/30/2024 0819 by Gladis Dania HERO, RN Outcome: Adequate for Discharge 04/30/2024 [phone] by Gladis Dania HERO, RN Outcome: Adequate for Discharge   Problem:  Coping: Goal: Ability to adjust to condition or change in health will improve 04/30/2024 0819 by Gladis Dania HERO, RN Outcome: Adequate for Discharge 04/30/2024 [phone] by  Gladis Dania HERO, RN Outcome: Adequate for Discharge   Problem: Fluid Volume: Goal: Ability to maintain a balanced intake and output will improve 04/30/2024 0819 by Gladis Dania HERO, RN Outcome: Adequate for Discharge 04/30/2024 [phone] by Gladis Dania HERO, RN Outcome: Adequate for Discharge   Problem: Health Behavior/Discharge Planning: Goal: Ability to identify and utilize available resources and services will improve 04/30/2024 0819 by Gladis Dania HERO, RN Outcome: Adequate for Discharge 04/30/2024 [phone] by Gladis Dania HERO, RN Outcome: Adequate for Discharge Goal: Ability to manage health-related needs will improve 04/30/2024 0819 by Gladis Dania HERO, RN Outcome: Adequate for Discharge 04/30/2024 [phone] by Gladis Dania HERO, RN Outcome: Adequate for Discharge   Problem: Metabolic: Goal: Ability to maintain appropriate glucose levels will improve 04/30/2024 0819 by Gladis Dania HERO, RN Outcome: Adequate for Discharge 04/30/2024 [phone] by Gladis Dania HERO, RN Outcome: Adequate for Discharge   Problem: Nutritional: Goal: Maintenance of adequate nutrition will improve 04/30/2024 0819 by Gladis Dania HERO, RN Outcome: Adequate for Discharge 04/30/2024 [phone] by Gladis Dania HERO, RN Outcome: Adequate for Discharge Goal: Progress toward achieving an optimal weight will improve 04/30/2024 0819 by Gladis Dania HERO, RN Outcome: Adequate for Discharge 04/30/2024 [phone] by Gladis Dania HERO, RN Outcome: Adequate for Discharge   Problem: Skin Integrity: Goal: Risk for impaired skin integrity will decrease 04/30/2024 0819 by Gladis Dania HERO, RN Outcome: Adequate for Discharge 04/30/2024 [phone] by Gladis Dania HERO, RN Outcome: Adequate for Discharge   Problem: Tissue Perfusion: Goal: Adequacy of tissue perfusion will improve 04/30/2024 0819 by Gladis Dania HERO, RN Outcome: Adequate for Discharge 04/30/2024 [phone] by Gladis Dania HERO, RN Outcome: Adequate for Discharge

## 2024-04-30 NOTE — Discharge Summary (Signed)
 Physician Discharge Summary  [name] FMW:[id number] DOB: August 27, 1975 DOA: 04/23/2024  PCP: Antonio Cyndee [name], DO  Admit date: 04/23/2024 Discharge date: 04/30/2024  Admitted From: Home Disposition:  Home  Recommendations for Outpatient Follow-up:  Follow up with PCP in 1-2 weeks Please obtain BMP/CBC in one week   Home Health:no Equipment/Devices:none  Discharge Condition:Stable CODE STATUS:Full Diet recommendation: Heart Healthy  Brief/Interim Summary: 48 y.o. female past medical history of essential hypertension history of breast cancer alcohol abuse comes in with abdominal pain and vomiting likely due to alcoholic pancreatitis   Discharge Diagnoses:  Principal Problem:   Alcoholic pancreatitis Active Problems:   Leukocytosis   AKI (acute kidney injury)   Fall at home, initial encounter   Hyperglycemia   Metabolic acidosis with increased anion gap and accumulation of organic acids   Alcohol abuse   Elevated liver function tests   Essential hypertension   History of breast cancer   Thrombocytopenia   Vapes nicotine containing substance   GERD (gastroesophageal reflux disease) Alcoholic pancreatitis/leukocytosis: CT scan of the abdomen pelvis showed pancreatitis. She was treated with IV fluids n.p.o. and narcotics. Abdominal pain improved. Ileus resolved. She was able to tolerate her diet she had a bowel movement.  Acute kidney injury: Likely hemodynamically mediated creatinine has returned to baseline.  Constipation: Started on MiraLAX  had a bowel movement.  Hyperglycemia: Likely reactive A1c 5.4.  High anion gap metabolic acidosis: Likely due to alcohol ketosis now resolved.  Alcohol abuse: Treated conservatively no signs of withdrawal.  Elevated LFTs: CT scan showed hepatic steatosis likely due to alcohol abuse.  Essential hypertension: No change made to his medication.  History of breast cancer: Follow-up with oncology as an  outpatient.  Acute thrombocytopenia: Resolved   Discharge Instructions  Discharge Instructions     Ambulatory referral to Physical Therapy   Complete by: As directed    Ambulatory referral to Physical Therapy   Complete by: As directed    Outpatient Physical Therapy-evaluation and treatment.   Diet - low sodium heart healthy   Complete by: As directed    Increase activity slowly   Complete by: As directed       Allergies as of 04/30/2024       Reactions   Tamiflu [oseltamivir Phosphate] Other (See Comments)   Seizure   Cipro [ciprofloxacin Hcl] Diarrhea, Nausea And Vomiting   Ciprofloxacin Diarrhea, Nausea And Vomiting   ciprofloxacin   Metoclopramide Other (See Comments)   Lock jaw Reglan   Sulfa Antibiotics Nausea And Vomiting        Medication List     STOP taking these medications    omeprazole  40 MG capsule Commonly known as: PRILOSEC       TAKE these medications    acetaminophen  500 MG tablet Commonly known as: TYLENOL  Take 1,000 mg by mouth every 6 (six) hours as needed for moderate pain.   anastrozole  1 MG tablet Commonly known as: ARIMIDEX  TAKE 1 TABLET BY MOUTH EVERY DAY   losartan  25 MG tablet Commonly known as: COZAAR  TAKE 1 TABLET (25 MG TOTAL) BY MOUTH DAILY.   metoprolol  succinate 50 MG 24 hr tablet Commonly known as: TOPROL -XL Take 1 tablet (50 mg total) by mouth daily. Take with or immediately following a meal.   ondansetron  8 MG disintegrating tablet Commonly known as: ZOFRAN -ODT Take 1 tablet (8 mg total) by mouth every 8 (eight) hours as needed for nausea or vomiting.   pantoprazole  40 MG tablet Commonly known as: PROTONIX   Take 1 tablet (40 mg total) by mouth daily.   valACYclovir  1000 MG tablet Commonly known as: VALTREX  Take 1 tablet (1,000 mg total) by mouth 3 (three) times daily as needed. For flare ups   Vitamin D  (Ergocalciferol ) 1.25 MG (50000 UNIT) Caps capsule Commonly known as: DRISDOL  Take 1 capsule  (50,000 Units total) by mouth every 7 (seven) days.   Zoladex  3.6 MG injection Generic drug: goserelin Inject 3.6 mg into the skin every 28 (twenty-eight) days.        Follow-up Information     [facility] Follow up.   Specialty: Rehabilitation Why: Outpatient Physicla Therapy-office to call with visit times. Contact information: [address] [place]  [address] [phone]               Allergies  Allergen Reactions   Tamiflu [Oseltamivir Phosphate] Other (See Comments)    Seizure   Cipro [Ciprofloxacin Hcl] Diarrhea and Nausea And Vomiting   Ciprofloxacin Diarrhea and Nausea And Vomiting    ciprofloxacin   Metoclopramide Other (See Comments)    Lock jaw  Reglan   Sulfa Antibiotics Nausea And Vomiting    Consultations: None   Procedures/Studies: DG Abd 1 View Result Date: 04/27/2024 EXAM: 1 VIEW XRAY OF THE ABDOMEN 04/27/2024 02:31:00 PM COMPARISON: CT 04/23/2024 CLINICAL HISTORY: Ileus FINDINGS: BOWEL: Increased air within the colon and central small bowel. No evidence of free air. SOFT TISSUES: No opaque urinary calculi. Prominent right liver lobe. BONES: No acute osseous abnormality. IMPRESSION: 1. Increased air in the colon and central small bowel, typical of ileus. Electronically signed by: Andrea Gasman MD 04/27/2024 05:45 PM EDT RP Workstation: [id number]   US  Abdomen Limited RUQ (LIVER/GB) Result Date: 04/27/2024 CLINICAL DATA:  [id number] Symptomatic cholelithiasis [id number] EXAM: ULTRASOUND ABDOMEN LIMITED RIGHT UPPER QUADRANT COMPARISON:  04/23/2024, 10/21/2023 FINDINGS: Gallbladder: No gallstones. No wall thickening or pericholecystic fluid. No sonographic Murphy's sign noted by sonographer. Common bile duct: Diameter: 3 mm Liver: Increased echogenicity. No focal lesion identified. No intrahepatic biliary ductal dilation. Portal vein is patent on color Doppler imaging with normal  direction of blood flow towards the liver. Other: None. IMPRESSION: 1. No cholecystolithiasis or changes of acute cholecystitis. 2. Hepatic steatosis. Electronically Signed   By: Rogelia Myers M.D.   On: 04/27/2024 17:13   US  RENAL Result Date: 04/24/2024 CLINICAL DATA:  Acute renal failure EXAM: RENAL / URINARY TRACT ULTRASOUND COMPLETE COMPARISON:  04/23/2024, 10/21/2023 FINDINGS: Right Kidney: Renal measurements: 12.6 x 4.8 x 4.9 cm = volume: 154.1 mL. Echogenicity within normal limits. No mass or hydronephrosis visualized. Left Kidney: Renal measurements: 11.5 x 5.3 x 4.3 cm = volume: 135.7 mL. Echogenicity within normal limits. No mass or hydronephrosis visualized. Bladder: Appears normal for degree of bladder distention. Other: Incidental note of small volume ascites right upper quadrant. IMPRESSION: 1. Unremarkable renal ultrasound. 2. Incidental small volume right upper quadrant ascites. Electronically Signed   By: Ozell Daring M.D.   On: 04/24/2024 20:13   CT HEAD WO CONTRAST ( ) Result Date: 04/23/2024 CLINICAL DATA:  Head trauma, repeat vomiting (Age 39-64y). EXAM: CT HEAD WITHOUT CONTRAST TECHNIQUE: Contiguous axial images were obtained from the base of the skull through the vertex without intravenous contrast. RADIATION DOSE REDUCTION: This exam was performed according to the departmental dose-optimization program which includes automated exposure control, adjustment of the mA and/or kV according to patient size and/or use of iterative reconstruction technique. COMPARISON:  None Available. FINDINGS: Brain: There is no  evidence of an acute infarct, intracranial hemorrhage, mass, midline shift, or extra-axial fluid collection. Cerebral volume is normal. The ventricles are normal in size. Vascular: No hyperdense vessel. Skull: No fracture or suspicious lesion. Sinuses/Orbits: Visualized paranasal sinuses and mastoid air cells are clear. Included orbits are unremarkable. Other: None. IMPRESSION:  Negative head CT. Electronically Signed   By: Dasie Hamburg M.D.   On: 04/23/2024 17:07   CT ABDOMEN PELVIS WO CONTRAST Result Date: 04/23/2024 CLINICAL DATA:  Abdomen pain. EXAM: CT ABDOMEN AND PELVIS WITHOUT CONTRAST TECHNIQUE: Multidetector CT imaging of the abdomen and pelvis was performed following the standard protocol without IV contrast. RADIATION DOSE REDUCTION: This exam was performed according to the departmental dose-optimization program which includes automated exposure control, adjustment of the mA and/or kV according to patient size and/or use of iterative reconstruction technique. COMPARISON:  CT abdomen pelvis dated 03/22/2017. FINDINGS: Evaluation of this exam is limited in the absence of intravenous contrast. Lower chest: The visualized lung bases are clear. No intra-abdominal free air. Small amount of inflammatory fluid in the upper abdomen and extending along the left paracolic gutter. Hepatobiliary: Severe fatty liver. The liver is enlarged measuring 20 cm in midclavicular length. Findings likely represent steatohepatitis. Correlation with LFTs recommended. High attenuating content within the gallbladder may represent sludge. No calcified gallstone. Pancreas: There is inflammatory changes of the pancreas with infiltration of the peripancreatic fat consistent with acute pancreatitis. Small amount of inflammatory fluid extends down along the left paracolic gutter which demonstrates a slightly higher attenuation and may represent a complex/hemorrhagic fluid. Clinical correlation recommended to evaluate for possibility of hemorrhagic pancreatitis. Spleen: Normal in size without focal abnormality. Adrenals/Urinary Tract: The adrenal glands unremarkable. The kidneys, and the visualized ureters appear unremarkable. The urinary bladder is collapsed. Stomach/Bowel: Small hiatal hernia. Fluid within the distal esophagus likely represents reflux. There is no bowel obstruction or active inflammation. The  appendix is normal. Vascular/Lymphatic: Mild aortoiliac atherosclerotic disease. The IVC is unremarkable. No portal venous gas. There is no adenopathy. Reproductive: The uterus is grossly unremarkable. No suspicious adnexal masses. Other: None Musculoskeletal: Osteopenia with degenerative changes spine. L5-S1 anterior fusion. No acute osseous pathology. IMPRESSION: 1. Acute pancreatitis.  No abscess or pseudocyst. 2. Enlarged fatty liver suggestive of steatohepatitis. Correlation with LFTs recommended. 3. No bowel obstruction. Normal appendix. 4.  Aortic Atherosclerosis (ICD10-I70.0). Electronically Signed   By: Vanetta Chou M.D.   On: 04/23/2024 14:47   (Echo, Carotid, EGD, Colonoscopy, ERCP)    Subjective: No complaints  Discharge Exam: Vitals:   04/29/24 2318 04/30/24 0457  BP: 119/88 115/82  Pulse: 76 73  Resp: 18 18  Temp: 99.2 F (37.3 C) 98.2 F (36.8 C)  SpO2: 99% 100%   Vitals:   04/29/24 1611 04/29/24 2013 04/29/24 2318 04/30/24 0457  BP: 109/85 (!) 119/90 119/88 115/82  Pulse: 71 80 76 73  Resp: 16 18 18 18   Temp: 98.6 F (37 C) 99.1 F (37.3 C) 99.2 F (37.3 C) 98.2 F (36.8 C)  TempSrc: Oral Oral Oral Oral  SpO2: 100% 100% 99% 100%  Weight:      Height:        General: Pt is alert, awake, not in acute distress Cardiovascular: RRR, S1/S2 +, no rubs, no gallops Respiratory: CTA bilaterally, no wheezing, no rhonchi Abdominal: Soft, NT, ND, bowel sounds + Extremities: no edema, no cyanosis    The results of significant diagnostics from this hospitalization (including imaging, microbiology, ancillary and laboratory) are listed below for reference.  Microbiology: Recent Results (from the past 240 hours)  Culture, blood (routine x 2)     Status: None   Collection Time: 04/23/24  3:21 PM   Specimen: BLOOD  Result Value Ref Range Status   Specimen Description BLOOD SITE NOT SPECIFIED  Final   Special Requests   Final    BOTTLES DRAWN AEROBIC AND  ANAEROBIC Blood Culture adequate volume   Culture   Final    NO GROWTH 5 DAYS Performed at [facility], [address] [place], [place] [address]    Report Status 04/28/2024 FINAL  Final     Labs: BNP (last 3 results) No results for input(s): BNP in the last 8760 hours. Basic Metabolic Panel: Recent Labs  Lab 04/23/24 1805 04/24/24 0441 04/25/24 0439 04/26/24 0323 04/27/24 0607 04/28/24 0428 04/29/24 0506  NA  --    < > 128* 131* 133* 133* 133*  K  --    < > 3.5 3.2* 4.2 3.7 3.8  CL  --    < > 94* 97* 97* 92* 93*  CO2  --    < > 20* 21* 26 28 29   GLUCOSE  --    < > 180* 141* 155* 134* 139*  BUN  --    < > 36* 25* 17 11 8   CREATININE  --    < > 2.34* 1.74* 1.23* 0.98 0.96  CALCIUM  --    < > 8.8* 9.1 9.5 9.6 9.5  MG 1.6*  --  2.4  --  1.5* 2.1 1.6*  PHOS 2.6  --   --   --   --   --   --    < > = values in this interval not displayed.   Liver Function Tests: Recent Labs  Lab 04/25/24 0439 04/26/24 0323 04/27/24 0607 04/28/24 0428 04/29/24 0506  AST 78* 82* 74* 64* 65*  ALT 26 27 26 26 26   ALKPHOS 54 59 70 68 73  BILITOT 1.5* 1.7* 1.5* 1.3* 1.3*  PROT 7.0 7.2 7.1 7.4 7.7  ALBUMIN  3.6 3.5 3.4* 3.6 3.6   Recent Labs  Lab 04/23/24 1209 04/27/24 0607  LIPASE >2,800* 108*   No results for input(s): AMMONIA in the last 168 hours. CBC: Recent Labs  Lab 04/25/24 0439 04/26/24 0323 04/27/24 0607 04/28/24 0428 04/29/24 0506  WBC 4.8 5.9 4.2 4.6 5.4  HGB 10.5* 10.6* 10.8* 10.9* 10.9*  HCT 30.6* 30.2* 31.8* 32.2* 32.4*  MCV 97.1 97.7 99.1 98.5 98.5  PLT 78* 114* 123* 183 244   Cardiac Enzymes: No results for input(s): CKTOTAL, CKMB, CKMBINDEX, TROPONINI in the last 168 hours. BNP: Invalid input(s): POCBNP CBG: Recent Labs  Lab 04/28/24 1146 04/28/24 1704 04/28/24 2101 04/29/24 0736 04/29/24 1159  GLUCAP 152* 205* 169* 134* 183*   D-Dimer No results for input(s): DDIMER in the last 72 hours. Hgb A1c No results for input(s):  HGBA1C in the last 72 hours. Lipid Profile No results for input(s): CHOL, HDL, LDLCALC, TRIG, CHOLHDL, LDLDIRECT in the last 72 hours. Thyroid function studies No results for input(s): TSH, T4TOTAL, T3FREE, THYROIDAB in the last 72 hours.  Invalid input(s): FREET3 Anemia work up No results for input(s): VITAMINB12, FOLATE, FERRITIN, TIBC, IRON, RETICCTPCT in the last 72 hours. Urinalysis    Component Value Date/Time   COLORURINE AMBER (A) 04/23/2024 2011   APPEARANCEUR CLOUDY (A) 04/23/2024 2011   LABSPEC 1.013 04/23/2024 2011   PHURINE 5.0 04/23/2024 2011   GLUCOSEU 150 (A) 04/23/2024  2011   GLUCOSEU NEG mg/dL 97/79/7991 7765   HGBUR SMALL (A) 04/23/2024 2011   HGBUR large 06/01/2009 1449   BILIRUBINUR NEGATIVE 04/23/2024 2011   BILIRUBINUR small (A) 03/20/2019 1142   BILIRUBINUR neg 01/20/2018 1751   KETONESUR 80 (A) 04/23/2024 2011   PROTEINUR >=300 (A) 04/23/2024 2011   UROBILINOGEN 2.0 (A) 03/20/2019 1142   UROBILINOGEN 0.2 11/08/2011 0912   NITRITE NEGATIVE 04/23/2024 2011   LEUKOCYTESUR NEGATIVE 04/23/2024 2011   Sepsis Labs Recent Labs  Lab 04/26/24 0323 04/27/24 0607 04/28/24 0428 04/29/24 0506  WBC 5.9 4.2 4.6 5.4   Microbiology Recent Results (from the past 240 hours)  Culture, blood (routine x 2)     Status: None   Collection Time: 04/23/24  3:21 PM   Specimen: BLOOD  Result Value Ref Range Status   Specimen Description BLOOD SITE NOT SPECIFIED  Final   Special Requests   Final    BOTTLES DRAWN AEROBIC AND ANAEROBIC Blood Culture adequate volume   Culture   Final    NO GROWTH 5 DAYS Performed at [facility], [address] [place], [place] [address]    Report Status 04/28/2024 FINAL  Final     Time coordinating discharge: Over 35 minutes  SIGNED:   Erle Odell Castor, MD  [facility] 04/30/2024, 7:30 AM Pager   If 7PM-7AM, please contact night-coverage [email or web address] Password [email or web address]

## 2024-05-01 ENCOUNTER — Inpatient Hospital Stay: Attending: Hematology and Oncology

## 2024-05-01 ENCOUNTER — Telehealth: Payer: Self-pay

## 2024-05-01 VITALS — BP 114/85 | HR 87 | Temp 98.9°F | Resp 19

## 2024-05-01 DIAGNOSIS — C50312 Malignant neoplasm of lower-inner quadrant of left female breast: Secondary | ICD-10-CM | POA: Insufficient documentation

## 2024-05-01 DIAGNOSIS — C50912 Malignant neoplasm of unspecified site of left female breast: Secondary | ICD-10-CM

## 2024-05-01 DIAGNOSIS — Z5111 Encounter for antineoplastic chemotherapy: Secondary | ICD-10-CM | POA: Insufficient documentation

## 2024-05-01 MED ORDER — GOSERELIN ACETATE 3.6 MG ~~LOC~~ IMPL
3.6000 mg | DRUG_IMPLANT | Freq: Once | SUBCUTANEOUS | Status: AC
  Administered 2024-05-01: 3.6 mg via SUBCUTANEOUS
  Filled 2024-05-01: qty 3.6

## 2024-05-01 NOTE — Transitions of Care (Post Inpatient/ED Visit) (Signed)
 05/01/2024  Name: [name] MRN: [id number] DOB: [birth year]  Today's TOC FU Call Status: Today's TOC FU Call Status:: Successful TOC FU Call Completed TOC FU Call Complete Date: 05/01/24 Patient's Name and Date of Birth confirmed.  Transition Care Management Follow-up Telephone Call Date of Discharge: 04/30/24 Discharge Facility: [facility]) Type of Discharge: Inpatient Admission Primary Inpatient Discharge Diagnosis:: pancreatitis How have you been since you were released from the hospital?: Better Any questions or concerns?: No  Items Reviewed: Did you receive and understand the discharge instructions provided?: Yes Medications obtained,verified, and reconciled?: Yes (Medications Reviewed) Any new allergies since your discharge?: No Dietary orders reviewed?: Yes Do you have support at home?: Yes People in Home [RPT]: spouse  Medications Reviewed Today: Medications Reviewed Today     Reviewed by Emmitt Pan, LPN (Licensed Practical Nurse) on 05/01/24 at 1003  Med List Status: <None>   Medication Order Taking? Sig Documenting Provider Last Dose Status Informant  acetaminophen  (TYLENOL ) 500 MG tablet [id number] Yes Take 1,000 mg by mouth every 6 (six) hours as needed for moderate pain. [provider]  Active Self  anastrozole  (ARIMIDEX ) 1 MG tablet [id number] Yes TAKE 1 TABLET BY MOUTH EVERY DAY Antonio Meth, Yvonne R, DO  Active Self  goserelin (ZOLADEX ) 3.6 MG injection [id number] Yes Inject 3.6 mg into the skin every 28 (twenty-eight) days. [provider]  Active Self  losartan  (COZAAR ) 25 MG tablet [id number] Yes TAKE 1 TABLET (25 MG TOTAL) BY MOUTH DAILY. Lowne Chase, Yvonne R, DO  Active Self  metoprolol  succinate (TOPROL -XL) 50 MG 24 hr tablet [id number] Yes Take 1 tablet (50 mg total) by mouth daily. Take with or immediately following a meal. Lowne Chase, Yvonne R, DO  Active Self    Discontinued 10/30/11 1217 (Discontinued by  provider)   ondansetron  (ZOFRAN -ODT) 8 MG disintegrating tablet [id number] Yes Take 1 tablet (8 mg total) by mouth every 8 (eight) hours as needed for nausea or vomiting. Iola Lukes, FNP  Active Self  pantoprazole  (PROTONIX ) 40 MG tablet [id number] Yes Take 1 tablet (40 mg total) by mouth daily. Odell Celinda Balo, MD  Active   valACYclovir  (VALTREX ) 1000 MG tablet [id number] Yes Take 1 tablet (1,000 mg total) by mouth 3 (three) times daily as needed. For flare ups Antonio Meth, Jamee SAUNDERS, DO  Active Self  Vitamin D , Ergocalciferol , (DRISDOL ) 1.25 MG (50000 UNIT) CAPS capsule [id number] Yes Take 1 capsule (50,000 Units total) by mouth every 7 (seven) days. Antonio Meth Jamee SAUNDERS, DO  Active Self           Med Note (SATTERFIELD, DARIUS E   Thu Apr 23, 2024  4:05 PM) No specific days             Home Care and Equipment/Supplies: Were Home Health Services Ordered?: NA Any new equipment or medical supplies ordered?: NA  Functional Questionnaire: Do you need assistance with bathing/showering or dressing?: No Do you need assistance with meal preparation?: No Do you need assistance with eating?: No Do you have difficulty maintaining continence: No Do you need assistance with getting out of bed/getting out of a chair/moving?: No Do you have difficulty managing or taking your medications?: No  Follow up appointments reviewed: PCP Follow-up appointment confirmed?: Yes Date of PCP follow-up appointment?: 05/07/24 Follow-up Provider: cone Do you need transportation to your follow-up appointment?: No Do you understand care options if your condition(s) worsen?: Yes-patient verbalized understanding   Pan Emmitt, LPN [facility] Nurse Health Advisor Direct  Dial [phone]  CELESTINO Julian Lemmings, LPN [facility] Nurse Health Advisor Direct Dial [phone]

## 2024-05-07 ENCOUNTER — Ambulatory Visit: Admitting: Family Medicine

## 2024-05-07 ENCOUNTER — Encounter: Payer: Self-pay | Admitting: Family Medicine

## 2024-05-07 VITALS — BP 98/70 | HR 65 | Temp 98.3°F | Resp 18 | Ht 63.0 in | Wt 135.0 lb

## 2024-05-07 DIAGNOSIS — F418 Other specified anxiety disorders: Secondary | ICD-10-CM | POA: Insufficient documentation

## 2024-05-07 DIAGNOSIS — I10 Essential (primary) hypertension: Secondary | ICD-10-CM | POA: Diagnosis not present

## 2024-05-07 DIAGNOSIS — K852 Alcohol induced acute pancreatitis without necrosis or infection: Secondary | ICD-10-CM

## 2024-05-07 LAB — COMPREHENSIVE METABOLIC PANEL WITH GFR
ALT: 45 U/L — ABNORMAL HIGH (ref 0–35)
AST: 112 U/L — ABNORMAL HIGH (ref 0–37)
Albumin: 4.3 g/dL (ref 3.5–5.2)
Alkaline Phosphatase: 65 U/L (ref 39–117)
BUN: 12 mg/dL (ref 6–23)
CO2: 26 meq/L (ref 19–32)
Calcium: 9.3 mg/dL (ref 8.4–10.5)
Chloride: 101 meq/L (ref 96–112)
Creatinine, Ser: 0.81 mg/dL (ref 0.40–1.20)
GFR: 86.09 mL/min (ref >60.00)
Glucose, Bld: 129 mg/dL — ABNORMAL HIGH (ref 70–99)
Potassium: 3.8 meq/L (ref 3.5–5.1)
Sodium: 138 meq/L (ref 135–145)
Total Bilirubin: 0.8 mg/dL (ref 0.2–1.2)
Total Protein: 7.9 g/dL (ref 6.0–8.3)

## 2024-05-07 LAB — CBC WITH DIFFERENTIAL/PLATELET
Basophils Absolute: 0.1 K/uL (ref 0.0–0.1)
Basophils Relative: 1.2 % (ref 0.0–3.0)
Eosinophils Absolute: 0.1 K/uL (ref 0.0–0.7)
Eosinophils Relative: 1.5 % (ref 0.0–5.0)
HCT: 33.1 % — ABNORMAL LOW (ref 36.0–46.0)
Hemoglobin: 11.2 g/dL — ABNORMAL LOW (ref 12.0–15.0)
Lymphocytes Relative: 13.4 % (ref 12.0–46.0)
Lymphs Abs: 0.7 K/uL (ref 0.7–4.0)
MCHC: 33.9 g/dL (ref 30.0–36.0)
MCV: 98.4 fl (ref 78.0–100.0)
Monocytes Absolute: 0.5 K/uL (ref 0.1–1.0)
Monocytes Relative: 10.1 % (ref 3.0–12.0)
Neutro Abs: 3.9 K/uL (ref 1.4–7.7)
Neutrophils Relative %: 73.8 % (ref 43.0–77.0)
Platelets: 259 K/uL (ref 150.0–400.0)
RBC: 3.37 Mil/uL — ABNORMAL LOW (ref 3.87–5.11)
RDW: 13.3 % (ref 11.5–15.5)
WBC: 5.3 K/uL (ref 4.0–10.5)

## 2024-05-07 LAB — VITAMIN B12: Vitamin B-12: 640 pg/mL (ref 211–911)

## 2024-05-07 LAB — VITAMIN D 25 HYDROXY (VIT D DEFICIENCY, FRACTURES): VITD: 18.04 ng/mL — ABNORMAL LOW (ref 30.00–100.00)

## 2024-05-07 LAB — LIPASE: Lipase: 99 U/L — ABNORMAL HIGH (ref 11.0–59.0)

## 2024-05-07 MED ORDER — ESCITALOPRAM OXALATE 10 MG PO TABS: 10.0000 mg | ORAL_TABLET | Freq: Every day | ORAL | 2 refills | Status: DC

## 2024-05-07 NOTE — Progress Notes (Signed)
 Subjective:    Patient ID: [name], female    DOB: Jun 29, 1976, 48 y.o.   MRN: [id number]  Chief Complaint  Patient presents with   Hospitalization Follow-up    Alcoholic pancreatitis    HPI Patient is in today for hosp f/u.  Her father is with her.  Discussed the use of AI scribe software for clinical note transcription with the patient, who gave verbal consent to proceed.  History of Present Illness [name] is a 48 year old female who presents for follow-up after hospitalization for pancreatitis. She is accompanied by her father, a retired Librarian, academic.  She has a significant history of alcohol use disorder, with increased consumption over the past six months, leading to a fall and a bruise over her right eye. Since her recent hospitalization, she has been abstinent from alcohol. She has noticed her blood pressure is lower and her tremors have resolved. She uses a breathalyzer device called Soberlink twice daily to monitor her sobriety, with results sent to her husband.  She was hospitalized for seven days, and her initial lipase level was over 2000, which decreased to 100 before discharge. She experienced nausea and vomiting, initially thought to be lactose intolerance, but it worsened over time. She sought urgent care and was given a dissolvable nausea medication, which was ineffective, leading to her emergency room visit. During hospitalization, she was monitored for elevated blood sugar levels and was told she had an ileus.  She has a history of depression, exacerbated by her cancer diagnoses and job loss. She is currently attending an intensive outpatient program at the [facility], which includes family therapy and group sessions. She has been prescribed Zoladex  and anastrozole  by her oncologist, which she believes may contribute to her depression. She is seeing a PA for medication management.  She is currently taking losartan  and metoprolol   for blood pressure management. Her blood pressure has decreased since abstaining from alcohol. She also takes melatonin for sleep issues, which have persisted since her hospitalization. She has not been on antidepressants before.  She has not been working and experiences anxiety, particularly with driving. She has been married for 26 years and has a supportive husband. She has had sleep disturbances and soft stools since her hospitalization.    Past Medical History:  Diagnosis Date   Anemia    Back pain    Benign neoplasm of skin 11/19/2008   Qualifier: Diagnosis of   By: Antonio ROSALEA Rockers      IMO SNOMED Dx Update Oct 2024     Bimalleolar ankle fracture    left   Breast cancer [facility])    right breast- 2020   Breast cancer (HCC) 09/2022   left breast   Breast cancer (HCC) 10/2022   Left   Constipation 01/16/2011   IMO SNOMED Dx Update Oct 2024     Family history of breast cancer    Fever, unspecified 03/23/2019   GERD 09/18/2006   Qualifier: Diagnosis of   By: Nicholaus Channel         GERD (gastroesophageal reflux disease)    Hypertension    no meds   Kidney infection    1998- hosp.- [facility]   Personal history of radiation therapy    Rectal bleed 05/20/2017   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, more than 10 years ago- no instances since   WRIST PAIN, LEFT 10/17/2010   Qualifier: Diagnosis of   By: Antonio ROSALEA Rockers  Past Surgical History:  Procedure Laterality Date   ANTERIOR LUMBAR FUSION  11/14/2011   Procedure: ANTERIOR LUMBAR FUSION 2 LEVELS;  Surgeon: Reyes JAYSON Billing, MD;  Location: [facility] OR;  Service: Orthopedics;  Laterality: N/A;  ALIF L5-S1   BACK SURGERY     BREAST BIOPSY  11/13/2022   MM LT RADIOACTIVE SEED LOC MAMMO GUIDE 11/13/2022 [facility] MAMMOGRAPHY   BREAST LUMPECTOMY Right 01/2019   BREAST LUMPECTOMY WITH RADIOACTIVE SEED AND SENTINEL LYMPH NODE BIOPSY Left 11/15/2022   Procedure: LEFT BREAST LUMPECTOMY WITH RADIOACTIVE SEED AND SENTINEL LYMPH NODE BIOPSY;  Surgeon:  Vanderbilt Ned, MD;  Location: [facility] OR;  Service: General;  Laterality: Left;   BREAST LUMPECTOMY WITH RADIOACTIVE SEED LOCALIZATION Right 01/13/2019   Procedure: RIGHT BREAST LUMPECTOMY WITH RADIOACTIVE SEED LOCALIZATION X3;  Surgeon: Vanderbilt Ned, MD;  Location: [facility];  Service: General;  Laterality: Right;   CESAREAN SECTION     2007- /w epidural  anesthesia    COLONOSCOPY     IRRIGATION AND DEBRIDEMENT ABSCESS Left 11/19/2022   Procedure: IRRIGATION AND DEBRIDEMENT LEFT AXILLA;  Surgeon: Dasie Leonor CROME, MD;  Location: [facility] OR;  Service: General;  Laterality: Left;   LUMBAR LAMINECTOMY/DECOMPRESSION MICRODISCECTOMY Right 12/15/2015   Procedure:  MICO LUMBER DECOMPRESSION L4-5 ON THE RIGHT, ;  Surgeon: Reyes Billing, MD;  Location: [facility] ORS;  Service: Orthopedics;  Laterality: Right;   ORIF ANKLE FRACTURE Left 10/03/2017   Procedure: OPEN REDUCTION INTERNAL FIXATION (ORIF) ANKLE BIMALLEOLAR FRACTURE; possible deltoid ligament repair;  Surgeon: Kit Rush, MD;  Location: [facility];  Service: Orthopedics;  Laterality: Left;   SPINE SURGERY     fusion   UPPER GASTROINTESTINAL ENDOSCOPY     WISDOM TOOTH EXTRACTION     young adult    Family History  Problem Relation Age of Onset   Lupus Mother    Breast cancer Maternal Aunt    Anesthesia problems Neg Hx    Colon cancer Neg Hx    Colon polyps Neg Hx    Esophageal cancer Neg Hx    Rectal cancer Neg Hx    Stomach cancer Neg Hx     Social History   Socioeconomic History   Marital status: Married    Spouse name: Not on file   Number of children: 1   Years of education: Not on file   Highest education level: Not on file  Occupational History    Comment: working full time  Tobacco Use   Smoking status: Former    Current packs/day: 0.00    Average packs/day: 0.3 packs/day for 15.0 years (3.8 ttl pk-yrs)    Types: Cigarettes    Start date: 04/01/2002    Quit date: 04/01/2017    Years since quitting:  7.1   Smokeless tobacco: Never   Tobacco comments:    vapes daily  Vaping Use   Vaping status: Every Day   Substances: Nicotine  Substance and Sexual Activity   Alcohol use: Yes    Alcohol/week: 1.0 standard drink of alcohol    Types: 1 Glasses of wine per week    Comment: maybe 1 glass of wine per week, if that   Drug use: No   Sexual activity: Not on file  Other Topics Concern   Not on file  Social History Narrative   Not on file   Social Drivers of Health   Financial Resource Strain: Not on file  Food Insecurity: No Food Insecurity (04/23/2024)   Hunger Vital Sign  Worried About Programme researcher, broadcasting/film/video in the Last Year: Never true    Ran Out of Food in the Last Year: Never true  Transportation Needs: No Transportation Needs (04/23/2024)   PRAPARE - Administrator, Civil Service (Medical): No    Lack of Transportation (Non-Medical): No  Physical Activity: Not on file  Stress: Not on file  Social Connections: Not on file  Intimate Partner Violence: Not At Risk (04/24/2024)   Humiliation, Afraid, Rape, and Kick questionnaire    Fear of Current or Ex-Partner: No    Emotionally Abused: No    Physically Abused: No    Sexually Abused: No    Outpatient Medications Prior to Visit  Medication Sig Dispense Refill   acetaminophen  (TYLENOL ) 500 MG tablet Take 1,000 mg by mouth every 6 (six) hours as needed for moderate pain.     anastrozole  (ARIMIDEX ) 1 MG tablet TAKE 1 TABLET BY MOUTH EVERY DAY 90 tablet 1   goserelin (ZOLADEX ) 3.6 MG injection Inject 3.6 mg into the skin every 28 (twenty-eight) days.     losartan  (COZAAR ) 25 MG tablet TAKE 1 TABLET (25 MG TOTAL) BY MOUTH DAILY. 90 tablet 1   metoprolol  succinate (TOPROL -XL) 50 MG 24 hr tablet Take 1 tablet (50 mg total) by mouth daily. Take with or immediately following a meal. 90 tablet 3   ondansetron  (ZOFRAN -ODT) 8 MG disintegrating tablet Take 1 tablet (8 mg total) by mouth every 8 (eight) hours as needed for nausea  or vomiting. 12 tablet 0   pantoprazole  (PROTONIX ) 40 MG tablet Take 1 tablet (40 mg total) by mouth daily. 30 tablet 0   valACYclovir  (VALTREX ) 1000 MG tablet Take 1 tablet (1,000 mg total) by mouth 3 (three) times daily as needed. For flare ups 90 tablet 2   Vitamin D , Ergocalciferol , (DRISDOL ) 1.25 MG (50000 UNIT) CAPS capsule Take 1 capsule (50,000 Units total) by mouth every 7 (seven) days. 12 capsule 1   No facility-administered medications prior to visit.    Allergies  Allergen Reactions   Tamiflu [Oseltamivir Phosphate] Other (See Comments)    Seizure   Cipro [Ciprofloxacin Hcl] Diarrhea and Nausea And Vomiting   Ciprofloxacin Diarrhea and Nausea And Vomiting    ciprofloxacin   Metoclopramide Other (See Comments)    Lock jaw  Reglan   Sulfa Antibiotics Nausea And Vomiting    Review of Systems  Constitutional:  Negative for fever and malaise/fatigue.  HENT:  Negative for congestion.   Eyes:  Negative for blurred vision.  Respiratory:  Negative for cough and shortness of breath.   Cardiovascular:  Negative for chest pain, palpitations and leg swelling.  Gastrointestinal:  Negative for abdominal pain, blood in stool, nausea and vomiting.  Genitourinary:  Negative for dysuria and frequency.  Musculoskeletal:  Negative for back pain and falls.  Skin:  Negative for rash.  Neurological:  Negative for dizziness, loss of consciousness and headaches.  Endo/Heme/Allergies:  Negative for environmental allergies.  Psychiatric/Behavioral:  Negative for depression. The patient is not nervous/anxious.        Objective:    Physical Exam Vitals and nursing note reviewed.  Constitutional:      General: She is not in acute distress.    Appearance: Normal appearance. She is well-developed.  HENT:     Head: Normocephalic and atraumatic.  Eyes:     General: No scleral icterus.       Right eye: No discharge.        Left eye:  No discharge.  Cardiovascular:     Rate and Rhythm:  Normal rate and regular rhythm.     Heart sounds: No murmur heard. Pulmonary:     Effort: Pulmonary effort is normal. No respiratory distress.     Breath sounds: Normal breath sounds.  Abdominal:     General: There is distension.     Palpations: There is no mass.     Tenderness: There is no abdominal tenderness. There is no right CVA tenderness, left CVA tenderness, guarding or rebound.  Musculoskeletal:        General: Normal range of motion.     Cervical back: Normal range of motion and neck supple.     Right lower leg: No edema.     Left lower leg: No edema.  Skin:    General: Skin is warm and Kelly.  Neurological:     Mental Status: She is alert and oriented to person, place, and time.  Psychiatric:        Mood and Affect: Mood normal.        Behavior: Behavior normal.        Thought Content: Thought content normal.        Judgment: Judgment normal.     BP 98/70 (BP Location: Right Arm, Patient Position: Sitting, Cuff Size: Normal)   Pulse 65   Temp 98.3 F (36.8 C) (Oral)   Resp 18   Ht 5' 3 (1.6 m)   Wt 135 lb (61.2 kg)   LMP 02/20/2013   SpO2 95%   BMI 23.91 kg/m  Wt Readings from Last 3 Encounters:  05/07/24 135 lb (61.2 kg)  04/23/24 136 lb (61.7 kg)  01/02/24 143 lb 3.2 oz (65 kg)    Diabetic Foot Exam - Simple   No data filed    Lab Results  Component Value Date   WBC 5.4 04/29/2024   HGB 10.9 (L) 04/29/2024   HCT 32.4 (L) 04/29/2024   PLT 244 04/29/2024   GLUCOSE 139 (H) 04/29/2024   CHOL 262 (H) 02/19/2023   TRIG 85.0 02/19/2023   HDL 116.30 02/19/2023   LDLCALC 129 (H) 02/19/2023   ALT 26 04/29/2024   AST 65 (H) 04/29/2024   NA 133 (L) 04/29/2024   K 3.8 04/29/2024   CL 93 (L) 04/29/2024   CREATININE 0.96 04/29/2024   BUN 8 04/29/2024   CO2 29 04/29/2024   TSH 1.09 02/19/2023   INR 1.2 04/24/2024   HGBA1C 5.4 04/23/2024    Lab Results  Component Value Date   TSH 1.09 02/19/2023   Lab Results  Component Value Date   WBC 5.4  04/29/2024   HGB 10.9 (L) 04/29/2024   HCT 32.4 (L) 04/29/2024   MCV 98.5 04/29/2024   PLT 244 04/29/2024   Lab Results  Component Value Date   NA 133 (L) 04/29/2024   K 3.8 04/29/2024   CO2 29 04/29/2024   GLUCOSE 139 (H) 04/29/2024   BUN 8 04/29/2024   CREATININE 0.96 04/29/2024   BILITOT 1.3 (H) 04/29/2024   ALKPHOS 73 04/29/2024   AST 65 (H) 04/29/2024   ALT 26 04/29/2024   PROT 7.7 04/29/2024   ALBUMIN  3.6 04/29/2024   CALCIUM 9.5 04/29/2024   ANIONGAP 11 04/29/2024   GFR 110.61 02/19/2023   Lab Results  Component Value Date   CHOL 262 (H) 02/19/2023   Lab Results  Component Value Date   HDL 116.30 02/19/2023   Lab Results  Component Value Date   LDLCALC  129 (H) 02/19/2023   Lab Results  Component Value Date   TRIG 85.0 02/19/2023   Lab Results  Component Value Date   CHOLHDL 2 02/19/2023   Lab Results  Component Value Date   HGBA1C 5.4 04/23/2024       Assessment & Plan:  Alcohol-induced acute pancreatitis, unspecified complication status -     Comprehensive metabolic panel with GFR -     CBC with Differential/Platelet -     Lipase -     Vitamin B12 -     Vitamin B1 -     Vitamin B6  Depression with anxiety -     Escitalopram Oxalate; Take 1 tablet (10 mg total) by mouth daily.  Dispense: 30 tablet; Refill: 2 -     VITAMIN D  25 Hydroxy (Vit-D Deficiency, Fractures)  Assessment and Plan Assessment & Plan Alcohol use disorder   She has significantly reduced alcohol consumption over the past six months, leading to improved blood pressure and resolution of tremors. She participates in an intensive outpatient program at the [facility] and uses a Soberlink device for sobriety monitoring. She prefers outpatient treatment to remain with her husband. Continue the intensive outpatient program and use the Soberlink device for alcohol monitoring.  Major depressive disorder and generalized anxiety disorder   Her depression and anxiety may be  exacerbated by cancer treatments (Zoladex  and anastrozole ) and recent life stressors, including job loss and health issues. She has not previously used antidepressants. Lexapro (escitalopram) is considered for treatment, with potential side effects like fatigue, which can be managed by taking it at night. Discussed using melatonin for sleep issues, avoiding addictive sleep medications. Start Lexapro 10 mg daily, monitor for side effects, particularly fatigue, and continue therapy sessions at the [facility]. Consider melatonin for sleep issues.  Acute pancreatitis   She was recently hospitalized with a lipase level over 2000, which improved to 100 before discharge. Gastrointestinal symptoms, including nausea, vomiting, diarrhea, and ileus, have improved. Liver enzymes are well-managed. Recheck CBC, chemistry panel, and lipase levels. Monitor gastrointestinal symptoms and bowel movements.  Hypertension   Her blood pressure has improved with alcohol abstinence. Previously on losartan  and metoprolol , her blood pressure is now low, and pulse is stable. Losartan  is stopped, and blood pressure will be monitored. Continue metoprolol  25 mg, monitor blood pressure and symptoms such as lightheadedness or dizziness, and re-evaluate blood pressure in 2-3 weeks.  Breast cancer with lymph node removal   She is currently receiving treatment with Zoladex  and anastrozole  from her oncologist.  Endometrial cancer    [name] Antonio Cyndee, DO

## 2024-05-07 NOTE — Assessment & Plan Note (Signed)
 D/c losartan  Return to office 2-3 weeks for recheck

## 2024-05-07 NOTE — Assessment & Plan Note (Signed)
 Pt has not had a drink since admission

## 2024-05-12 LAB — VITAMIN B1: Vitamin B1 (Thiamine): 20 nmol/L (ref 8–30)

## 2024-05-12 LAB — VITAMIN B6: Vitamin B6: 17.3 ng/mL (ref 2.1–21.7)

## 2024-05-13 ENCOUNTER — Telehealth: Payer: Self-pay

## 2024-05-13 NOTE — Telephone Encounter (Signed)
 Spoke with patient and confirmed appointment on 10/9

## 2024-05-14 ENCOUNTER — Inpatient Hospital Stay: Attending: Hematology and Oncology | Admitting: Hematology and Oncology

## 2024-05-14 ENCOUNTER — Inpatient Hospital Stay

## 2024-05-14 VITALS — BP 112/78 | HR 78 | Temp 97.3°F | Resp 17 | Wt 135.3 lb

## 2024-05-14 DIAGNOSIS — Z79811 Long term (current) use of aromatase inhibitors: Secondary | ICD-10-CM | POA: Diagnosis not present

## 2024-05-14 DIAGNOSIS — E612 Magnesium deficiency: Secondary | ICD-10-CM | POA: Insufficient documentation

## 2024-05-14 DIAGNOSIS — C50912 Malignant neoplasm of unspecified site of left female breast: Secondary | ICD-10-CM | POA: Diagnosis not present

## 2024-05-14 DIAGNOSIS — Z170 Estrogen receptor positive status [ER+]: Secondary | ICD-10-CM | POA: Diagnosis not present

## 2024-05-14 DIAGNOSIS — C50312 Malignant neoplasm of lower-inner quadrant of left female breast: Secondary | ICD-10-CM | POA: Insufficient documentation

## 2024-05-14 DIAGNOSIS — D649 Anemia, unspecified: Secondary | ICD-10-CM | POA: Diagnosis not present

## 2024-05-14 DIAGNOSIS — D0511 Intraductal carcinoma in situ of right breast: Secondary | ICD-10-CM

## 2024-05-14 DIAGNOSIS — E559 Vitamin D deficiency, unspecified: Secondary | ICD-10-CM | POA: Insufficient documentation

## 2024-05-14 DIAGNOSIS — I10 Essential (primary) hypertension: Secondary | ICD-10-CM | POA: Insufficient documentation

## 2024-05-14 DIAGNOSIS — Z5111 Encounter for antineoplastic chemotherapy: Secondary | ICD-10-CM | POA: Diagnosis present

## 2024-05-14 LAB — MAGNESIUM: Magnesium: 1.3 mg/dL — ABNORMAL LOW (ref 1.7–2.4)

## 2024-05-14 NOTE — Progress Notes (Signed)
 " BRIEF ONCOLOGIC HISTORY:  Oncology History  Invasive ductal carcinoma of breast, female, left (HCC)  10/01/2018 Genetic Testing   Genetic testing performed through [facility]'s Breast Cancer STAT Panel + Common Hereditary Cancers Panel showed no pathogenic mutations.    The STAT Breast cancer panel offered by [facility] includes sequencing and rearrangement analysis for the following 9 genes:  ATM, BRCA1, BRCA2, CDH1, CHEK2, PALB2, PTEN, STK11 and TP53.     The Common Hereditary Cancers Panel offered by [facility] includes sequencing and/or deletion duplication testing of the following 47 genes: APC, ATM, AXIN2, BARD1, BMPR1A, BRCA1, BRCA2, BRIP1, CDH1, CDKN2A (p14ARF), CDKN2A (p16INK4a), CKD4, CHEK2, CTNNA1, DICER1, EPCAM (Deletion/duplication testing only), GREM1 (promoter region deletion/duplication testing only), KIT, MEN1, MLH1, MSH2, MSH3, MSH6, MUTYH, NBN, NF1, NHTL1, PALB2, PDGFRA, PMS2, POLD1, POLE, PTEN, RAD50, RAD51C, RAD51D, SDHB, SDHC, SDHD, SMAD4, SMARCA4. STK11, TP53, TSC1, TSC2, and VHL.  The following genes were evaluated for sequence changes only: SDHA and HOXB13 c.251G>A variant only..   05/07/2019 - 12/10/2022 Anti-estrogen oral therapy   Tamoxifen     10/15/2022 Initial Diagnosis   Invasive ductal carcinoma of breast, female, left (HCC)   11/15/2022 Surgery   BREAST, LEFT, LUMPECTOMY:      Invasive ductal carcinoma / Invasive carcinoma of no special type      Tumor size: 0.4 cm in maximal dimension      Histologic Grade: Grade 1 Margins, invasive:  Negative           Closest, invasive:  4 mm from anterior margin Margins, DCIS:  Not identified           Closest, DCIS: NA Lymphovascular invasion:  Not identified Prognostic markers: ER: 100%, Positive, moderate staining intensity PR: 30%, Positive, moderate staining intensity Her2: Negative (By FISH) Ki-67: 5%   12/26/2022 - 01/23/2023 Radiation Therapy   Plan Name: Breast_L_BH Site: Breast, Left Technique: 3D Mode: Photon Dose  Per Fraction: 2.66 Gy Prescribed Dose (Delivered / Prescribed): 42.56 Gy / 42.56 Gy Prescribed Fxs (Delivered / Prescribed): 16 / 16   Plan Name: Brst_L_Bst_BH:1 Site: Breast, Left Technique: 3D Mode: Photon Dose Per Fraction: 2 Gy Prescribed Dose (Delivered / Prescribed): 8 Gy / 8 Gy Prescribed Fxs (Delivered / Prescribed): 4 / 4   02/2023 -  Anti-estrogen oral therapy   Anastrozole    08/22/2023 Cancer Staging   Staging form: Breast, AJCC 8th Edition - Pathologic: Stage IA (pT1a, pN0, cM0, G1, ER+, PR+, HER2-) - Signed by Crawford Morna Pickle, NP on 08/22/2023 Histologic grading system: 3 grade system     INTERVAL HISTORY:   Discussed the use of AI scribe software for clinical note transcription with the patient, who gave verbal consent to proceed.  History of Present Illness  [name] is a 48 year old female with breast cancer and alcohol-induced pancreatitis who presents for follow-up on her medical conditions.  She was recently hospitalized for seven days due to pancreatitis attributed to alcohol use. Since discharge, she has entered outpatient treatment, attending group classes to address her alcohol consumption. She notes improvement in symptoms, including reduced tremors and normalized blood pressure, leading to the discontinuation of losartan  on October 2nd.  Her breast cancer treatment includes monthly Depo shots and anastrozole . She associates the Depo shots with occasional bruising and lumps, which vary depending on the administering nurse. She takes a daily vitamin D  supplement, having resumed it after a period of low levels. Her magnesium  was slightly low during her hospital stay, and she is now back  on vitamin D  at a daily dose of 2000 IU.  She experiences hot flashes, though they have become less severe. Her sleep has been disrupted since her hospital stay, with increased frequency of waking up at night to use the bathroom. She is working on  regaining her strength through walking and improving her diet. She has lost weight unintentionally, which she attributes to her pancreatitis treatment. Her bowel movements have normalized after a period of irregularity, and she denies any current issues with urination.  An MRI in the summer was reported as good, and she has a mammogram scheduled for tomorrow. She recalls having a DEXA scan earlier this year.  Rest of the pertinent 10 point ROS reviewed and neg.    PAST MEDICAL/SURGICAL HISTORY:  Past Medical History:  Diagnosis Date   Anemia    Back pain    Benign neoplasm of skin 11/19/2008   Qualifier: Diagnosis of   By: Antonio ROSALEA Rockers      IMO SNOMED Dx Update Oct 2024     Bimalleolar ankle fracture    left   Breast cancer [facility])    right breast- 2020   Breast cancer (HCC) 09/2022   left breast   Breast cancer (HCC) 10/2022   Left   Constipation 01/16/2011   IMO SNOMED Dx Update Oct 2024     Family history of breast cancer    Fever, unspecified 03/23/2019   GERD 09/18/2006   Qualifier: Diagnosis of   By: Nicholaus Channel         GERD (gastroesophageal reflux disease)    Hypertension    no meds   Kidney infection    1998- hosp[facility]   Personal history of radiation therapy    Rectal bleed 05/20/2017   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, more than 10 years ago- no instances since   WRIST PAIN, LEFT 10/17/2010   Qualifier: Diagnosis of   By: Antonio ROSALEA Rockers         Past Surgical History:  Procedure Laterality Date   ANTERIOR LUMBAR FUSION  11/14/2011   Procedure: ANTERIOR LUMBAR FUSION 2 LEVELS;  Surgeon: Reyes JAYSON Billing, MD;  Location: [facility] OR;  Service: Orthopedics;  Laterality: N/A;  ALIF L5-S1   BACK SURGERY     BREAST BIOPSY  11/13/2022   MM LT RADIOACTIVE SEED LOC MAMMO GUIDE 11/13/2022 [facility] MAMMOGRAPHY   BREAST LUMPECTOMY Right 01/2019   BREAST LUMPECTOMY WITH RADIOACTIVE SEED AND SENTINEL LYMPH NODE BIOPSY Left 11/15/2022   Procedure: LEFT BREAST LUMPECTOMY WITH  RADIOACTIVE SEED AND SENTINEL LYMPH NODE BIOPSY;  Surgeon: Vanderbilt Ned, MD;  Location: [facility] OR;  Service: General;  Laterality: Left;   BREAST LUMPECTOMY WITH RADIOACTIVE SEED LOCALIZATION Right 01/13/2019   Procedure: RIGHT BREAST LUMPECTOMY WITH RADIOACTIVE SEED LOCALIZATION X3;  Surgeon: Vanderbilt Ned, MD;  Location: [facility];  Service: General;  Laterality: Right;   CESAREAN SECTION     2007- /w epidural  anesthesia    COLONOSCOPY     IRRIGATION AND DEBRIDEMENT ABSCESS Left 11/19/2022   Procedure: IRRIGATION AND DEBRIDEMENT LEFT AXILLA;  Surgeon: Dasie Leonor CROME, MD;  Location: [facility] OR;  Service: General;  Laterality: Left;   LUMBAR LAMINECTOMY/DECOMPRESSION MICRODISCECTOMY Right 12/15/2015   Procedure:  MICO LUMBER DECOMPRESSION L4-5 ON THE RIGHT, ;  Surgeon: Reyes Billing, MD;  Location: [facility];  Service: Orthopedics;  Laterality: Right;   ORIF ANKLE FRACTURE Left 10/03/2017   Procedure: OPEN REDUCTION INTERNAL FIXATION (ORIF) ANKLE BIMALLEOLAR FRACTURE; possible  deltoid ligament repair;  Surgeon: Kit Rush, MD;  Location: [facility];  Service: Orthopedics;  Laterality: Left;   SPINE SURGERY     fusion   UPPER GASTROINTESTINAL ENDOSCOPY     WISDOM TOOTH EXTRACTION     young adult     ALLERGIES:  Allergies  Allergen Reactions   Tamiflu [Oseltamivir Phosphate] Other (See Comments)    Seizure   Cipro [Ciprofloxacin Hcl] Diarrhea and Nausea And Vomiting   Ciprofloxacin Diarrhea and Nausea And Vomiting    ciprofloxacin   Metoclopramide Other (See Comments)    Lock jaw  Reglan   Sulfa Antibiotics Nausea And Vomiting     CURRENT MEDICATIONS:  Outpatient Encounter Medications as of 05/14/2024  Medication Sig Note   acetaminophen  (TYLENOL ) 500 MG tablet Take 1,000 mg by mouth every 6 (six) hours as needed for moderate pain.    anastrozole  (ARIMIDEX ) 1 MG tablet TAKE 1 TABLET BY MOUTH EVERY DAY    escitalopram  (LEXAPRO ) 10 MG tablet Take 1 tablet  (10 mg total) by mouth daily.    goserelin (ZOLADEX ) 3.6 MG injection Inject 3.6 mg into the skin every 28 (twenty-eight) days.    losartan  (COZAAR ) 25 MG tablet TAKE 1 TABLET (25 MG TOTAL) BY MOUTH DAILY.    metoprolol  succinate (TOPROL -XL) 50 MG 24 hr tablet Take 1 tablet (50 mg total) by mouth daily. Take with or immediately following a meal.    ondansetron  (ZOFRAN -ODT) 8 MG disintegrating tablet Take 1 tablet (8 mg total) by mouth every 8 (eight) hours as needed for nausea or vomiting.    pantoprazole  (PROTONIX ) 40 MG tablet Take 1 tablet (40 mg total) by mouth daily.    valACYclovir  (VALTREX ) 1000 MG tablet Take 1 tablet (1,000 mg total) by mouth 3 (three) times daily as needed. For flare ups    Vitamin D , Ergocalciferol , (DRISDOL ) 1.25 MG (50000 UNIT) CAPS capsule Take 1 capsule (50,000 Units total) by mouth every 7 (seven) days. 04/23/2024: No specific days    [DISCONTINUED] Norgestim-Eth Estrad Triphasic (ORTHO TRI-CYCLEN, 28, PO) Take 0.035 mg by mouth daily.     No facility-administered encounter medications on file as of 05/14/2024.     ONCOLOGIC FAMILY HISTORY:  Family History  Problem Relation Age of Onset   Lupus Mother    Breast cancer Maternal Aunt    Anesthesia problems Neg Hx    Colon cancer Neg Hx    Colon polyps Neg Hx    Esophageal cancer Neg Hx    Rectal cancer Neg Hx    Stomach cancer Neg Hx      SOCIAL HISTORY:  Social History   Socioeconomic History   Marital status: Married    Spouse name: Not on file   Number of children: 1   Years of education: Not on file   Highest education level: Not on file  Occupational History    Comment: working full time  Tobacco Use   Smoking status: Former    Current packs/day: 0.00    Average packs/day: 0.3 packs/day for 15.0 years (3.8 ttl pk-yrs)    Types: Cigarettes    Start date: 04/01/2002    Quit date: 04/01/2017    Years since quitting: 7.1   Smokeless tobacco: Never   Tobacco comments:    vapes daily  Vaping  Use   Vaping status: Every Day   Substances: Nicotine  Substance and Sexual Activity   Alcohol use: Yes    Alcohol/week: 1.0 standard drink of alcohol  Types: 1 Glasses of wine per week    Comment: maybe 1 glass of wine per week, if that   Drug use: No   Sexual activity: Not on file  Other Topics Concern   Not on file  Social History Narrative   Not on file   Social Drivers of Health   Financial Resource Strain: Not on file  Food Insecurity: No Food Insecurity (04/23/2024)   Hunger Vital Sign    Worried About Running Out of Food in the Last Year: Never true    Ran Out of Food in the Last Year: Never true  Transportation Needs: No Transportation Needs (04/23/2024)   PRAPARE - Administrator, Civil Service (Medical): No    Lack of Transportation (Non-Medical): No  Physical Activity: Not on file  Stress: Not on file  Social Connections: Not on file  Intimate Partner Violence: Not At Risk (04/24/2024)   Humiliation, Afraid, Rape, and Kick questionnaire    Fear of Current or Ex-Partner: No    Emotionally Abused: No    Physically Abused: No    Sexually Abused: No     OBSERVATIONS/OBJECTIVE:  BP 112/78 (BP Location: Left Arm, Patient Position: Sitting)   Pulse 78   Temp (!) 97.3 F (36.3 C) (Temporal)   Resp 17   Wt 135 lb 4.8 oz (61.4 kg)   LMP 02/20/2013   SpO2 100%   BMI 23.97 kg/m  GENERAL: Patient is a well appearing female in no acute distress NODES:  No cervical, supraclavicular, or axillary lymphadenopathy palpated.  BREAST EXAM: Left breast status postlumpectomy and radiation no sign of local recurrence right breast is benign. No LE edema.   LABORATORY DATA:  None for this visit.  DIAGNOSTIC IMAGING:  None for this visit.      ASSESSMENT AND PLAN:   [name] is a pleasant 48 y.o. female with Stage 1A left breast invasive ductal carcinoma, ER+/PR+/HER2-, diagnosed in April 2024, treated with lumpectomy, adjuvant radiation therapy, and  anti-estrogen therapy with anastrozole  beginning in 02/2023 with Zoladex /anastrozole  given every 4 weeks.    Assessment & Plan  Breast cancer Breast cancer managed with monthly zoladex  injections and anastrozole . Recent MRI was good. Bruising from injections improved with skilled nurse. - Schedule monthly injections every 28 days. - Ensure anastrozole  prescription is refilled. - Perform mammogram as scheduled. - Schedule MRI for late May next year.  Alcohol use disorder in early remission In early remission, attending outpatient group classes. Improvement in tremors and blood pressure noted.  Hypertension Hypertension improved, losartan  discontinued. Blood pressure normalized.  Sleep disturbance Sleep disturbances possibly related to hospitalization and detoxification. Trying melatonin.  Anemia Anemia improving with hemoglobin at 11.2 g/dL. Improvement noted with better nutrition and reduced alcohol intake.  Vitamin D  deficiency Managed with daily supplementation of 2000 IU.  Magnesium  deficiency Magnesium  slightly low at 1.6 mg/dL during hospitalization. Monitoring required. - Order blood test to check magnesium  levels.  Time spent: 30 min  *Total Encounter Time as defined by the Centers for Medicare and Medicaid Services includes, in addition to the face-to-face time of a patient visit (documented in the note above) non-face-to-face time: obtaining and reviewing outside history, ordering and reviewing medications, tests or procedures, care coordination (communications with other health care professionals or caregivers) and documentation in the medical record.  "

## 2024-05-15 ENCOUNTER — Other Ambulatory Visit: Payer: Self-pay | Admitting: *Deleted

## 2024-05-15 ENCOUNTER — Ambulatory Visit
Admission: RE | Admit: 2024-05-15 | Discharge: 2024-05-15 | Disposition: A | Discharge status: Home / Self Care | Source: Ambulatory Visit | Attending: Hematology and Oncology | Admitting: Hematology and Oncology

## 2024-05-15 ENCOUNTER — Encounter: Payer: Self-pay | Admitting: Hematology and Oncology

## 2024-05-15 ENCOUNTER — Ambulatory Visit: Payer: Self-pay | Admitting: Hematology and Oncology

## 2024-05-15 ENCOUNTER — Other Ambulatory Visit: Payer: Self-pay | Admitting: Hematology and Oncology

## 2024-05-15 DIAGNOSIS — C50912 Malignant neoplasm of unspecified site of left female breast: Secondary | ICD-10-CM

## 2024-05-15 DIAGNOSIS — D0511 Intraductal carcinoma in situ of right breast: Secondary | ICD-10-CM

## 2024-05-15 MED ORDER — MAGNESIUM OXIDE -MG SUPPLEMENT 400 (240 MG) MG PO TABS: 400.0000 mg | ORAL_TABLET | Freq: Two times a day (BID) | ORAL | 1 refills | Status: AC

## 2024-05-15 NOTE — Telephone Encounter (Signed)
-----   Message from [place] sent at 05/15/2024 11:02 AM EDT ----- Tried calling her no answer. Can we arrange for 2 gms of Mag IV today, if pt is unable to come, then we can send Mg Oxide 400 mg po BID for 5 days and re check. ----- Message ----- From: Rebecka, [facility] [place] Sent: 05/14/2024   2:44 PM EDT To: Amber Stalls, MD

## 2024-05-15 NOTE — Telephone Encounter (Signed)
 Per message below, called pt to make aware of recommendations. Pt opted for magnesium  by mouth. Sent in rx to pt pharmacy, pt verbalized understanding.

## 2024-05-17 ENCOUNTER — Ambulatory Visit: Payer: Self-pay | Admitting: Family Medicine

## 2024-05-19 MED ORDER — VITAMIN D (ERGOCALCIFEROL) 1.25 MG (50000 UNIT) PO CAPS: 50000.0000 [IU] | ORAL_CAPSULE | ORAL | 1 refills | Status: AC

## 2024-05-26 ENCOUNTER — Encounter: Payer: Self-pay | Admitting: Family Medicine

## 2024-05-26 ENCOUNTER — Ambulatory Visit: Admitting: Family Medicine

## 2024-05-26 VITALS — BP 98/78 | HR 70 | Temp 98.3°F | Resp 18 | Ht 63.0 in | Wt 134.8 lb

## 2024-05-26 DIAGNOSIS — K219 Gastro-esophageal reflux disease without esophagitis: Secondary | ICD-10-CM | POA: Diagnosis not present

## 2024-05-26 DIAGNOSIS — I10 Essential (primary) hypertension: Secondary | ICD-10-CM | POA: Diagnosis not present

## 2024-05-26 MED ORDER — PANTOPRAZOLE SODIUM 40 MG PO TBEC: 40.0000 mg | DELAYED_RELEASE_TABLET | Freq: Every day | ORAL | 0 refills | Status: AC

## 2024-05-26 NOTE — Progress Notes (Signed)
 - .       Subjective:    Patient ID: [name], female    DOB: [birth year], 48 y.o.   MRN: [id number]  Chief Complaint  Patient presents with   Hypertension   Follow-up    HPI Patient is in today for f/u bp.  Discussed the use of AI scribe software for clinical note transcription with the patient, who gave verbal consent to proceed.  History of Present Illness [name] [name] is a 48 year old female who presents for medication management and follow-up.  She has been experiencing low blood pressure readings, with a recent measurement of 108/83 mmHg. She has stopped taking metoprolol  and is currently on losartan  25 mg. Alcohol consumption affects her blood pressure.  She has been on an antidepressant for a couple of weeks without noticing any improvement. She is attending therapy sessions once a week and finds that staying busy helps her avoid the desire to drink alcohol, which she is avoiding to prevent hospitalization.  She is currently taking pantoprazole  for gastroesophageal reflux disease (GERD) and plans to switch back to omeprazole  once her current supply is finished. She is no longer using Zofran  for nausea.  She is taking magnesium  supplements and is seeking advice on which type to purchase over the counter. Additionally, she is taking calcium with magnesium  for bone health.  She is attending classes and looking for part-time work in the afternoons and all day on Tuesdays and Thursdays. 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  Discussed the use of AI scribe software for clinical note transcription with the patient, who gave verbal consent to proceed.  History of Present Illness [name] [name] is a 48 year old female who presents for medication management and follow-up.  She has been experiencing low blood  pressure readings, with a recent measurement of 108/83 mmHg. She has stopped taking metoprolol  and is currently on losartan  25 mg. Alcohol consumption affects her blood pressure.  She has been on an antidepressant for a couple of weeks without noticing any improvement. She is attending therapy sessions once a week and finds that staying busy helps her avoid the desire to drink alcohol, which she is avoiding to prevent hospitalization.  She is currently taking pantoprazole  for gastroesophageal reflux disease (GERD) and plans to switch back to omeprazole  once her current supply is finished. She is no longer using Zofran  for nausea.  She is taking magnesium  supplements and is seeking advice on which type to purchase over the counter. Additionally, she is taking calcium with magnesium  for bone health.  She is attending classes and looking for part-time work in the afternoons and all day on Tuesdays and Thursdays. 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   Past Medical History:  Diagnosis Date   Anemia    Back pain    Benign neoplasm of skin 11/19/2008   Qualifier: Diagnosis of   By: Antonio ROSALEA Rockers      IMO SNOMED Dx Update Oct 2024     Bimalleolar ankle fracture    left   Breast cancer [facility])    right breast- 2020   Breast cancer (HCC) 09/2022   left breast   Breast cancer (HCC) 10/2022   Left   Constipation 01/16/2011   IMO SNOMED Dx  Update Oct 2024     Family history of breast cancer    Fever, unspecified 03/23/2019   GERD 09/18/2006   Qualifier: Diagnosis of   By: Nicholaus Channel         GERD (gastroesophageal reflux disease)    Hypertension    no meds   Kidney infection    1998- hosp.[facility]   Personal history of radiation therapy    Rectal bleed 05/20/2017   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, more than 10 years ago- no instances since   WRIST PAIN, LEFT 10/17/2010    Qualifier: Diagnosis of   By: Antonio ROSALEA Rockers          Past Surgical History:  Procedure Laterality Date   ANTERIOR LUMBAR FUSION  11/14/2011   Procedure: ANTERIOR LUMBAR FUSION 2 LEVELS;  Surgeon: Reyes JAYSON Billing, MD;  Location: [facility] OR;  Service: Orthopedics;  Laterality: N/A;  ALIF L5-S1   BACK SURGERY     BREAST BIOPSY  11/13/2022   MM LT RADIOACTIVE SEED LOC MAMMO GUIDE 11/13/2022 [facility] MAMMOGRAPHY   BREAST LUMPECTOMY Right 01/2019   BREAST LUMPECTOMY Left 11/2022   BREAST LUMPECTOMY WITH RADIOACTIVE SEED AND SENTINEL LYMPH NODE BIOPSY Left 11/15/2022   Procedure: LEFT BREAST LUMPECTOMY WITH RADIOACTIVE SEED AND SENTINEL LYMPH NODE BIOPSY;  Surgeon: Vanderbilt Ned, MD;  Location: [facility] OR;  Service: General;  Laterality: Left;   BREAST LUMPECTOMY WITH RADIOACTIVE SEED LOCALIZATION Right 01/13/2019   Procedure: RIGHT BREAST LUMPECTOMY WITH RADIOACTIVE SEED LOCALIZATION X3;  Surgeon: Vanderbilt Ned, MD;  Location: [facility];  Service: General;  Laterality: Right;   CESAREAN SECTION     2007- /w epidural  anesthesia    COLONOSCOPY     IRRIGATION AND DEBRIDEMENT ABSCESS Left 11/19/2022   Procedure: IRRIGATION AND DEBRIDEMENT LEFT AXILLA;  Surgeon: Dasie Leonor CROME, MD;  Location: [facility] OR;  Service: General;  Laterality: Left;   LUMBAR LAMINECTOMY/DECOMPRESSION MICRODISCECTOMY Right 12/15/2015   Procedure:  MICO LUMBER DECOMPRESSION L4-5 ON THE RIGHT, ;  Surgeon: Reyes Billing, MD;  Location: [facility];  Service: Orthopedics;  Laterality: Right;   ORIF ANKLE FRACTURE Left 10/03/2017   Procedure: OPEN REDUCTION INTERNAL FIXATION (ORIF) ANKLE BIMALLEOLAR FRACTURE; possible deltoid ligament repair;  Surgeon: Kit Rush, MD;  Location: [facility];  Service: Orthopedics;  Laterality: Left;   SPINE SURGERY     fusion   UPPER GASTROINTESTINAL ENDOSCOPY     WISDOM TOOTH EXTRACTION     young adult    Family History  Problem Relation Age of Onset   Lupus Mother     Breast cancer Maternal Aunt    Anesthesia problems Neg Hx    Colon cancer Neg Hx    Colon polyps Neg Hx    Esophageal cancer Neg Hx    Rectal cancer Neg Hx    Stomach cancer Neg Hx     Social History   Socioeconomic History   Marital status: Married    Spouse name: Not on file   Number of children: 1   Years of education: Not on file   Highest education level: Not on file  Occupational History    Comment: working full time  Tobacco Use   Smoking status: Former    Current packs/day: 0.00    Average packs/day: 0.3 packs/day for 15.0 years (3.8 ttl pk-yrs)    Types: Cigarettes    Start date: 04/01/2002    Quit date: 04/01/2017    Years since quitting: 7.1  Smokeless tobacco: Never   Tobacco comments:    vapes daily  Vaping Use   Vaping status: Every Day   Substances: Nicotine  Substance and Sexual Activity   Alcohol use: Yes    Alcohol/week: 1.0 standard drink of alcohol    Types: 1 Glasses of wine per week    Comment: maybe 1 glass of wine per week, if that   Drug use: No   Sexual activity: Not on file  Other Topics Concern   Not on file  Social History Narrative   Not on file   Social Drivers of Health   Financial Resource Strain: Not on file  Food Insecurity: No Food Insecurity (04/23/2024)   Hunger Vital Sign    Worried About Running Out of Food in the Last Year: Never true    Ran Out of Food in the Last Year: Never true  Transportation Needs: No Transportation Needs (04/23/2024)   PRAPARE - Administrator, Civil Service (Medical): No    Lack of Transportation (Non-Medical): No  Physical Activity: Not on file  Stress: Not on file  Social Connections: Not on file  Intimate Partner Violence: Not At Risk (04/24/2024)   Humiliation, Afraid, Rape, and Kick questionnaire    Fear of Current or Ex-Partner: No    Emotionally Abused: No    Physically Abused: No    Sexually Abused: No    Outpatient Medications Prior to Visit  Medication Sig Dispense  Refill   acetaminophen  (TYLENOL ) 500 MG tablet Take 1,000 mg by mouth every 6 (six) hours as needed for moderate pain.     anastrozole  (ARIMIDEX ) 1 MG tablet TAKE 1 TABLET BY MOUTH EVERY DAY 90 tablet 1   escitalopram (LEXAPRO) 10 MG tablet Take 1 tablet (10 mg total) by mouth daily. 30 tablet 2   goserelin (ZOLADEX ) 3.6 MG injection Inject 3.6 mg into the skin every 28 (twenty-eight) days.     magnesium  oxide (MAG-OX) 400 (240 Mg) MG tablet Take 1 tablet (400 mg total) by mouth 2 (two) times daily. 10 tablet 1   ondansetron  (ZOFRAN -ODT) 8 MG disintegrating tablet Take 1 tablet (8 mg total) by mouth every 8 (eight) hours as needed for nausea or vomiting. 12 tablet 0   valACYclovir  (VALTREX ) 1000 MG tablet Take 1 tablet (1,000 mg total) by mouth 3 (three) times daily as needed. For flare ups 90 tablet 2   Vitamin D , Ergocalciferol , (DRISDOL ) 1.25 MG (50000 UNIT) CAPS capsule Take 1 capsule (50,000 Units total) by mouth every 7 (seven) days. 12 capsule 1   losartan  (COZAAR ) 25 MG tablet TAKE 1 TABLET (25 MG TOTAL) BY MOUTH DAILY. 90 tablet 1   metoprolol  succinate (TOPROL -XL) 50 MG 24 hr tablet Take 1 tablet (50 mg total) by mouth daily. Take with or immediately following a meal. 90 tablet 3   pantoprazole  (PROTONIX ) 40 MG tablet Take 1 tablet (40 mg total) by mouth daily. 30 tablet 0   No facility-administered medications prior to visit.    Allergies  Allergen Reactions   Tamiflu [Oseltamivir Phosphate] Other (See Comments)    Seizure   Cipro [Ciprofloxacin Hcl] Diarrhea and Nausea And Vomiting   Ciprofloxacin Diarrhea and Nausea And Vomiting    ciprofloxacin   Metoclopramide Other (See Comments)    Lock jaw  Reglan   Sulfa Antibiotics Nausea And Vomiting    Review of Systems  Constitutional:  Negative for chills, fever and malaise/fatigue.  HENT:  Negative for congestion and hearing loss.  Eyes:  Negative for blurred vision and discharge.  Respiratory:  Negative for cough, sputum  production and shortness of breath.   Cardiovascular:  Negative for chest pain, palpitations and leg swelling.  Gastrointestinal:  Negative for abdominal pain, blood in stool, constipation, diarrhea, heartburn, nausea and vomiting.  Genitourinary:  Negative for dysuria, frequency, hematuria and urgency.  Musculoskeletal:  Negative for back pain, falls and myalgias.  Skin:  Negative for rash.  Neurological:  Negative for dizziness, sensory change, loss of consciousness, weakness and headaches.  Endo/Heme/Allergies:  Negative for environmental allergies. Does not bruise/bleed easily.  Psychiatric/Behavioral:  Negative for depression and suicidal ideas. The patient is not nervous/anxious and does not have insomnia.        Objective:    Physical Exam Vitals and nursing note reviewed.  Constitutional:      General: She is not in acute distress.    Appearance: Normal appearance. She is well-developed.  HENT:     Head: Normocephalic and atraumatic.  Eyes:     General: No scleral icterus.       Right eye: No discharge.        Left eye: No discharge.  Cardiovascular:     Rate and Rhythm: Normal rate and regular rhythm.     Heart sounds: No murmur heard. Pulmonary:     Effort: Pulmonary effort is normal. No respiratory distress.     Breath sounds: Normal breath sounds.  Musculoskeletal:        General: Normal range of motion.     Cervical back: Normal range of motion and neck supple.     Right lower leg: No edema.     Left lower leg: No edema.  Skin:    General: Skin is warm and Kelly.  Neurological:     Mental Status: She is alert and oriented to person, place, and time.  Psychiatric:        Mood and Affect: Mood normal.        Behavior: Behavior normal.        Thought Content: Thought content normal.        Judgment: Judgment normal.     BP 98/78 (BP Location: Right Arm, Patient Position: Sitting, Cuff Size: Normal)   Pulse 70   Temp 98.3 F (36.8 C) (Oral)   Resp 18   Ht  5' 3 (1.6 m)   Wt 134 lb 12.8 oz (61.1 kg)   LMP 02/20/2013   SpO2 100%   BMI 23.88 kg/m  Wt Readings from Last 3 Encounters:  05/26/24 134 lb 12.8 oz (61.1 kg)  05/14/24 135 lb 4.8 oz (61.4 kg)  05/07/24 135 lb (61.2 kg)    Diabetic Foot Exam - Simple   No data filed    Lab Results  Component Value Date   WBC 5.3 05/07/2024   HGB 11.2 (L) 05/07/2024   HCT 33.1 (L) 05/07/2024   PLT 259.0 05/07/2024   GLUCOSE 129 (H) 05/07/2024   CHOL 262 (H) 02/19/2023   TRIG 85.0 02/19/2023   HDL 116.30 02/19/2023   LDLCALC 129 (H) 02/19/2023   ALT 45 (H) 05/07/2024   AST 112 (H) 05/07/2024   NA 138 05/07/2024   K 3.8 05/07/2024   CL 101 05/07/2024   CREATININE 0.81 05/07/2024   BUN 12 05/07/2024   CO2 26 05/07/2024   TSH 1.09 02/19/2023   INR 1.2 04/24/2024   HGBA1C 5.4 04/23/2024    Lab Results  Component Value Date   TSH 1.09 02/19/2023  Lab Results  Component Value Date   WBC 5.3 05/07/2024   HGB 11.2 (L) 05/07/2024   HCT 33.1 (L) 05/07/2024   MCV 98.4 05/07/2024   PLT 259.0 05/07/2024   Lab Results  Component Value Date   NA 138 05/07/2024   K 3.8 05/07/2024   CO2 26 05/07/2024   GLUCOSE 129 (H) 05/07/2024   BUN 12 05/07/2024   CREATININE 0.81 05/07/2024   BILITOT 0.8 05/07/2024   ALKPHOS 65 05/07/2024   AST 112 (H) 05/07/2024   ALT 45 (H) 05/07/2024   PROT 7.9 05/07/2024   ALBUMIN  4.3 05/07/2024   CALCIUM 9.3 05/07/2024   ANIONGAP 11 04/29/2024   GFR 86.09 05/07/2024   Lab Results  Component Value Date   CHOL 262 (H) 02/19/2023   Lab Results  Component Value Date   HDL 116.30 02/19/2023   Lab Results  Component Value Date   LDLCALC 129 (H) 02/19/2023   Lab Results  Component Value Date   TRIG 85.0 02/19/2023   Lab Results  Component Value Date   CHOLHDL 2 02/19/2023   Lab Results  Component Value Date   HGBA1C 5.4 04/23/2024       Assessment & Plan:  Essential hypertension Assessment & Plan: Running low ---  stop losartan  ,  metoprolol  already stopped   Gastroesophageal reflux disease, unspecified whether esophagitis present -     Pantoprazole  Sodium; Take 1 tablet (40 mg total) by mouth daily.  Dispense: 30 tablet; Refill: 0   Assessment and Plan Assessment & Plan Hypertension   Blood pressure is low with recent readings of 108/83 and 98/78. She is currently on losartan  25 mg, with metoprolol  previously discontinued. Stop losartan .  Depression   No noticeable improvement with current antidepressant therapy after a couple of weeks. Continue current antidepressant therapy and reassess in early November. Increase dose to 20 mg if no improvement by early November. Follow up 3-4 weeks after dose adjustment.  Gastroesophageal Reflux Disease (GERD)   Continue pantoprazole  until supply is finished, then switch to omeprazole .  Alcohol Use Disorder   Currently abstaining from alcohol due to previous hospitalization. She has no current desire to drink and is motivated by the risk of returning to the hospital. Continue abstinence from alcohol and engage in activities to prevent boredom and reduce temptation to drink.  General Health Maintenance   Discussion about magnesium  supplementation for bone health. Take calcium with magnesium  supplementation.   [name] Diana Lowne Chase, DO

## 2024-05-26 NOTE — Patient Instructions (Signed)

## 2024-05-26 NOTE — Assessment & Plan Note (Addendum)
 Running low ---  stop losartan  , metoprolol  already stopped

## 2024-05-28 ENCOUNTER — Inpatient Hospital Stay

## 2024-05-28 VITALS — BP 107/83 | HR 88 | Temp 99.4°F | Resp 18

## 2024-05-28 DIAGNOSIS — C50912 Malignant neoplasm of unspecified site of left female breast: Secondary | ICD-10-CM

## 2024-05-28 DIAGNOSIS — Z5111 Encounter for antineoplastic chemotherapy: Secondary | ICD-10-CM | POA: Diagnosis not present

## 2024-05-28 MED ORDER — GOSERELIN ACETATE 3.6 MG ~~LOC~~ IMPL
3.6000 mg | DRUG_IMPLANT | Freq: Once | SUBCUTANEOUS | Status: AC
  Administered 2024-05-28: 3.6 mg via SUBCUTANEOUS

## 2024-05-29 ENCOUNTER — Other Ambulatory Visit: Payer: Self-pay | Admitting: Family Medicine

## 2024-05-29 DIAGNOSIS — F418 Other specified anxiety disorders: Secondary | ICD-10-CM

## 2024-06-25 ENCOUNTER — Inpatient Hospital Stay: Attending: Hematology and Oncology

## 2024-06-25 VITALS — BP 119/91 | HR 90 | Temp 99.5°F | Resp 18

## 2024-06-25 DIAGNOSIS — Z5111 Encounter for antineoplastic chemotherapy: Secondary | ICD-10-CM | POA: Insufficient documentation

## 2024-06-25 DIAGNOSIS — C50312 Malignant neoplasm of lower-inner quadrant of left female breast: Secondary | ICD-10-CM | POA: Diagnosis present

## 2024-06-25 DIAGNOSIS — C50912 Malignant neoplasm of unspecified site of left female breast: Secondary | ICD-10-CM

## 2024-06-25 MED ORDER — GOSERELIN ACETATE 3.6 MG ~~LOC~~ IMPL
3.6000 mg | DRUG_IMPLANT | Freq: Once | SUBCUTANEOUS | Status: AC
  Administered 2024-06-25: 3.6 mg via SUBCUTANEOUS
  Filled 2024-06-25: qty 3.6

## 2024-06-26 ENCOUNTER — Inpatient Hospital Stay

## 2024-06-30 ENCOUNTER — Encounter: Payer: Self-pay | Admitting: Hematology and Oncology

## 2024-07-06 ENCOUNTER — Encounter: Payer: Self-pay | Admitting: Hematology and Oncology

## 2024-07-07 ENCOUNTER — Ambulatory Visit: Payer: MEDICAID | Admitting: Family Medicine

## 2024-07-14 ENCOUNTER — Ambulatory Visit: Payer: MEDICAID | Admitting: Family Medicine

## 2024-07-14 VITALS — BP 116/81 | HR 91 | Temp 98.5°F | Resp 12 | Ht 63.0 in | Wt 139.0 lb

## 2024-07-14 DIAGNOSIS — C50912 Malignant neoplasm of unspecified site of left female breast: Secondary | ICD-10-CM | POA: Diagnosis not present

## 2024-07-14 DIAGNOSIS — F418 Other specified anxiety disorders: Secondary | ICD-10-CM | POA: Diagnosis not present

## 2024-07-14 DIAGNOSIS — Z1211 Encounter for screening for malignant neoplasm of colon: Secondary | ICD-10-CM

## 2024-07-14 DIAGNOSIS — K852 Alcohol induced acute pancreatitis without necrosis or infection: Secondary | ICD-10-CM

## 2024-07-14 NOTE — Progress Notes (Unsigned)
 Subjective:    Patient ID: [name], female    DOB: [birth year], 48 y.o.   MRN: [id number]  Chief Complaint  Patient presents with   6 week follow up    HPI Patient is in today for ***  Past Medical History:  Diagnosis Date   Anemia    Back pain    Benign neoplasm of skin 11/19/2008   Qualifier: Diagnosis of   By: Antonio ROSALEA Rockers      IMO SNOMED Dx Update Oct 2024     Bimalleolar ankle fracture    left   Breast cancer [facility])    right breast- 2020   Breast cancer (HCC) 09/2022   left breast   Breast cancer (HCC) 10/2022   Left   Constipation 01/16/2011   IMO SNOMED Dx Update Oct 2024     Family history of breast cancer    Fever, unspecified 03/23/2019   GERD 09/18/2006   Qualifier: Diagnosis of   By: Nicholaus Channel         GERD (gastroesophageal reflux disease)    Hypertension    no meds   Kidney infection    1998- hosp.[facility]   Personal history of radiation therapy    Rectal bleed 05/20/2017   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, more than 10 years ago- no instances since   WRIST PAIN, LEFT 10/17/2010   Qualifier: Diagnosis of   By: Antonio ROSALEA Rockers          Past Surgical History:  Procedure Laterality Date   ANTERIOR LUMBAR FUSION  11/14/2011   Procedure: ANTERIOR LUMBAR FUSION 2 LEVELS;  Surgeon: Reyes JAYSON Billing, MD;  Location: [facility];  Service: Orthopedics;  Laterality: N/A;  ALIF L5-S1   BACK SURGERY     BREAST BIOPSY  11/13/2022   MM LT RADIOACTIVE SEED LOC MAMMO GUIDE 11/13/2022 [facility] MAMMOGRAPHY   BREAST LUMPECTOMY Right 01/2019   BREAST LUMPECTOMY Left 11/2022   BREAST LUMPECTOMY WITH RADIOACTIVE SEED AND SENTINEL LYMPH NODE BIOPSY Left 11/15/2022   Procedure: LEFT BREAST LUMPECTOMY WITH RADIOACTIVE SEED AND SENTINEL LYMPH NODE BIOPSY;  Surgeon: Vanderbilt Ned, MD;  Location: [facility];  Service: General;  Laterality: Left;   BREAST LUMPECTOMY WITH RADIOACTIVE SEED LOCALIZATION Right 01/13/2019   Procedure: RIGHT BREAST LUMPECTOMY WITH RADIOACTIVE  SEED LOCALIZATION X3;  Surgeon: Vanderbilt Ned, MD;  Location: [facility];  Service: General;  Laterality: Right;   CESAREAN SECTION     2007- /w epidural  anesthesia    COLONOSCOPY     IRRIGATION AND DEBRIDEMENT ABSCESS Left 11/19/2022   Procedure: IRRIGATION AND DEBRIDEMENT LEFT AXILLA;  Surgeon: Dasie Leonor CROME, MD;  Location: [facility] OR;  Service: General;  Laterality: Left;   LUMBAR LAMINECTOMY/DECOMPRESSION MICRODISCECTOMY Right 12/15/2015   Procedure:  MICO LUMBER DECOMPRESSION L4-5 ON THE RIGHT, ;  Surgeon: Reyes Billing, MD;  Location: [facility];  Service: Orthopedics;  Laterality: Right;   ORIF ANKLE FRACTURE Left 10/03/2017   Procedure: OPEN REDUCTION INTERNAL FIXATION (ORIF) ANKLE BIMALLEOLAR FRACTURE; possible deltoid ligament repair;  Surgeon: Kit Rush, MD;  Location: [facility];  Service: Orthopedics;  Laterality: Left;   SPINE SURGERY     fusion   UPPER GASTROINTESTINAL ENDOSCOPY     WISDOM TOOTH EXTRACTION     young adult    Family History  Problem Relation Age of Onset   Lupus Mother    Breast cancer Maternal Aunt    Anesthesia problems Neg Hx  Colon cancer Neg Hx    Colon polyps Neg Hx    Esophageal cancer Neg Hx    Rectal cancer Neg Hx    Stomach cancer Neg Hx     Social History   Socioeconomic History   Marital status: Married    Spouse name: Not on file   Number of children: 1   Years of education: Not on file   Highest education level: Not on file  Occupational History    Comment: working full time  Tobacco Use   Smoking status: Former    Current packs/day: 0.00    Average packs/day: 0.3 packs/day for 15.0 years (3.8 ttl pk-yrs)    Types: Cigarettes    Start date: 04/01/2002    Quit date: 04/01/2017    Years since quitting: 7.2   Smokeless tobacco: Never   Tobacco comments:    vapes daily  Vaping Use   Vaping status: Every Day   Substances: Nicotine  Substance and Sexual Activity   Alcohol use: Yes     Alcohol/week: 1.0 standard drink of alcohol    Types: 1 Glasses of wine per week    Comment: maybe 1 glass of wine per week, if that   Drug use: No   Sexual activity: Not on file  Other Topics Concern   Not on file  Social History Narrative   Not on file   Social Drivers of Health   Financial Resource Strain: Not on file  Food Insecurity: No Food Insecurity (04/23/2024)   Hunger Vital Sign    Worried About Running Out of Food in the Last Year: Never true    Ran Out of Food in the Last Year: Never true  Transportation Needs: No Transportation Needs (04/23/2024)   PRAPARE - Administrator, Civil Service (Medical): No    Lack of Transportation (Non-Medical): No  Physical Activity: Not on file  Stress: Not on file  Social Connections: Not on file  Intimate Partner Violence: Not At Risk (04/24/2024)   Humiliation, Afraid, Rape, and Kick questionnaire    Fear of Current or Ex-Partner: No    Emotionally Abused: No    Physically Abused: No    Sexually Abused: No    Outpatient Medications Prior to Visit  Medication Sig Dispense Refill   anastrozole  (ARIMIDEX ) 1 MG tablet TAKE 1 TABLET BY MOUTH EVERY DAY 90 tablet 1   escitalopram  (LEXAPRO ) 10 MG tablet TAKE 1 TABLET BY MOUTH EVERY DAY 90 tablet 1   goserelin (ZOLADEX ) 3.6 MG injection Inject 3.6 mg into the skin every 28 (twenty-eight) days.     magnesium  oxide (MAG-OX) 400 (240 Mg) MG tablet Take 1 tablet (400 mg total) by mouth 2 (two) times daily. 10 tablet 1   ondansetron  (ZOFRAN -ODT) 8 MG disintegrating tablet Take 1 tablet (8 mg total) by mouth every 8 (eight) hours as needed for nausea or vomiting. 12 tablet 0   pantoprazole  (PROTONIX ) 40 MG tablet Take 1 tablet (40 mg total) by mouth daily. 30 tablet 0   valACYclovir  (VALTREX ) 1000 MG tablet Take 1 tablet (1,000 mg total) by mouth 3 (three) times daily as needed. For flare ups 90 tablet 2   Vitamin D , Ergocalciferol , (DRISDOL ) 1.25 MG (50000 UNIT) CAPS capsule Take 1  capsule (50,000 Units total) by mouth every 7 (seven) days. 12 capsule 1   acetaminophen  (TYLENOL ) 500 MG tablet Take 1,000 mg by mouth every 6 (six) hours as needed for moderate pain.     No  facility-administered medications prior to visit.    Allergies  Allergen Reactions   Tamiflu [Oseltamivir Phosphate] Other (See Comments)    Seizure   Cipro [Ciprofloxacin Hcl] Diarrhea and Nausea And Vomiting   Ciprofloxacin Diarrhea and Nausea And Vomiting    ciprofloxacin   Metoclopramide Other (See Comments)    Lock jaw  Reglan   Sulfa Antibiotics Nausea And Vomiting    ROS     Objective:    Physical Exam  BP 116/81 (BP Location: Right Arm, Patient Position: Sitting, Cuff Size: Normal)   Pulse 91   Temp 98.5 F (36.9 C) (Oral)   Resp 12   Ht 5' 3 (1.6 m)   Wt 139 lb (63 kg)   LMP 02/20/2013   SpO2 100%   BMI 24.62 kg/m  Wt Readings from Last 3 Encounters:  07/14/24 139 lb (63 kg)  05/26/24 134 lb 12.8 oz (61.1 kg)  05/14/24 135 lb 4.8 oz (61.4 kg)    Diabetic Foot Exam - Simple   No data filed    Lab Results  Component Value Date   WBC 5.3 05/07/2024   HGB 11.2 (L) 05/07/2024   HCT 33.1 (L) 05/07/2024   PLT 259.0 05/07/2024   GLUCOSE 129 (H) 05/07/2024   CHOL 262 (H) 02/19/2023   TRIG 85.0 02/19/2023   HDL 116.30 02/19/2023   LDLCALC 129 (H) 02/19/2023   ALT 45 (H) 05/07/2024   AST 112 (H) 05/07/2024   NA 138 05/07/2024   K 3.8 05/07/2024   CL 101 05/07/2024   CREATININE 0.81 05/07/2024   BUN 12 05/07/2024   CO2 26 05/07/2024   TSH 1.09 02/19/2023   INR 1.2 04/24/2024   HGBA1C 5.4 04/23/2024    Lab Results  Component Value Date   TSH 1.09 02/19/2023   Lab Results  Component Value Date   WBC 5.3 05/07/2024   HGB 11.2 (L) 05/07/2024   HCT 33.1 (L) 05/07/2024   MCV 98.4 05/07/2024   PLT 259.0 05/07/2024   Lab Results  Component Value Date   NA 138 05/07/2024   K 3.8 05/07/2024   CO2 26 05/07/2024   GLUCOSE 129 (H) 05/07/2024   BUN 12  05/07/2024   CREATININE 0.81 05/07/2024   BILITOT 0.8 05/07/2024   ALKPHOS 65 05/07/2024   AST 112 (H) 05/07/2024   ALT 45 (H) 05/07/2024   PROT 7.9 05/07/2024   ALBUMIN  4.3 05/07/2024   CALCIUM 9.3 05/07/2024   ANIONGAP 11 04/29/2024   GFR 86.09 05/07/2024   Lab Results  Component Value Date   CHOL 262 (H) 02/19/2023   Lab Results  Component Value Date   HDL 116.30 02/19/2023   Lab Results  Component Value Date   LDLCALC 129 (H) 02/19/2023   Lab Results  Component Value Date   TRIG 85.0 02/19/2023   Lab Results  Component Value Date   CHOLHDL 2 02/19/2023   Lab Results  Component Value Date   HGBA1C 5.4 04/23/2024       Assessment & Plan:  There are no diagnoses linked to this encounter.  Tyauna Lacaze R Lowne Chase, DO

## 2024-07-15 ENCOUNTER — Other Ambulatory Visit: Payer: Self-pay | Admitting: Family Medicine

## 2024-07-15 ENCOUNTER — Ambulatory Visit: Payer: Self-pay | Admitting: Family Medicine

## 2024-07-15 ENCOUNTER — Encounter: Payer: Self-pay | Admitting: Family Medicine

## 2024-07-15 DIAGNOSIS — R739 Hyperglycemia, unspecified: Secondary | ICD-10-CM

## 2024-07-15 LAB — COMPREHENSIVE METABOLIC PANEL WITH GFR
ALT: 21 U/L (ref 0–35)
AST: 28 U/L (ref 0–37)
Albumin: 4.7 g/dL (ref 3.5–5.2)
Alkaline Phosphatase: 71 U/L (ref 39–117)
BUN: 13 mg/dL (ref 6–23)
CO2: 30 meq/L (ref 19–32)
Calcium: 10.3 mg/dL (ref 8.4–10.5)
Chloride: 99 meq/L (ref 96–112)
Creatinine, Ser: 0.62 mg/dL (ref 0.40–1.20)
GFR: 105.47 mL/min (ref >60.00)
Glucose, Bld: 156 mg/dL — ABNORMAL HIGH (ref 70–99)
Potassium: 3.7 meq/L (ref 3.5–5.1)
Sodium: 137 meq/L (ref 135–145)
Total Bilirubin: 0.6 mg/dL (ref 0.2–1.2)
Total Protein: 8.3 g/dL (ref 6.0–8.3)

## 2024-07-15 LAB — LIPASE: Lipase: 11 U/L (ref 11.0–59.0)

## 2024-07-17 NOTE — Addendum Note (Signed)
 Addended by: Dai Mcadams D on: 07/17/2024 11:52 AM   Modules accepted: Orders

## 2024-07-24 ENCOUNTER — Inpatient Hospital Stay: Payer: MEDICAID | Attending: Hematology and Oncology

## 2024-07-24 ENCOUNTER — Telehealth: Payer: Self-pay | Admitting: Hematology and Oncology

## 2024-07-24 NOTE — Telephone Encounter (Signed)
 I spoke with patient and she stated due to starting a new job she will have to cancel Zoladex  for 07/24/2024. Patient will call back to reschedule all future injections per her requests.

## 2024-07-27 ENCOUNTER — Inpatient Hospital Stay: Payer: MEDICAID | Attending: Hematology and Oncology

## 2024-07-27 VITALS — BP 122/89 | HR 92 | Temp 99.2°F | Resp 18

## 2024-07-27 DIAGNOSIS — C50312 Malignant neoplasm of lower-inner quadrant of left female breast: Secondary | ICD-10-CM | POA: Insufficient documentation

## 2024-07-27 DIAGNOSIS — C50912 Malignant neoplasm of unspecified site of left female breast: Secondary | ICD-10-CM

## 2024-07-27 DIAGNOSIS — Z5111 Encounter for antineoplastic chemotherapy: Secondary | ICD-10-CM | POA: Diagnosis present

## 2024-07-27 MED ORDER — GOSERELIN ACETATE 3.6 MG ~~LOC~~ IMPL
3.6000 mg | DRUG_IMPLANT | Freq: Once | SUBCUTANEOUS | Status: AC
  Administered 2024-07-27: 3.6 mg via SUBCUTANEOUS
  Filled 2024-07-27 (×2): qty 3.6

## 2024-08-21 ENCOUNTER — Inpatient Hospital Stay: Payer: Self-pay | Attending: Hematology and Oncology

## 2024-08-21 ENCOUNTER — Inpatient Hospital Stay: Payer: MEDICAID | Attending: Hematology and Oncology

## 2024-08-21 VITALS — BP 121/84 | HR 92 | Resp 16

## 2024-08-21 DIAGNOSIS — Z5111 Encounter for antineoplastic chemotherapy: Secondary | ICD-10-CM | POA: Insufficient documentation

## 2024-08-21 DIAGNOSIS — C50912 Malignant neoplasm of unspecified site of left female breast: Secondary | ICD-10-CM

## 2024-08-21 DIAGNOSIS — C50312 Malignant neoplasm of lower-inner quadrant of left female breast: Secondary | ICD-10-CM | POA: Diagnosis present

## 2024-08-21 MED ORDER — GOSERELIN ACETATE 3.6 MG ~~LOC~~ IMPL
3.6000 mg | DRUG_IMPLANT | Freq: Once | SUBCUTANEOUS | Status: AC
  Administered 2024-08-21: 3.6 mg via SUBCUTANEOUS
  Filled 2024-08-21: qty 3.6

## 2024-08-21 NOTE — Patient Instructions (Signed)
 Goserelin Implant What is this medication? GOSERELIN (GOE se rel in) treats prostate cancer and breast cancer. It works by decreasing levels of the hormones testosterone and estrogen in the body. This prevents prostate and breast cancer cells from spreading or growing. It may also be used to treat endometriosis. This is a condition where the tissue that lines the uterus grows outside the uterus. It works by decreasing the amount of estrogen your body makes, which reduces heavy bleeding and pain. It can also be used to help thin the lining of the uterus before a surgery used to prevent or reduce heavy periods. This medicine may be used for other purposes; ask your health care provider or pharmacist if you have questions. COMMON BRAND NAME(S): Zoladex, Zoladex 76-Month What should I tell my care team before I take this medication? They need to know if you have any of these conditions: Bone problems Diabetes Heart disease History of irregular heartbeat or rhythm An unusual or allergic reaction to goserelin, other medications, foods, dyes, or preservatives Pregnant or trying to get pregnant Breastfeeding How should I use this medication? This medication is injected under the skin. It is given by your care team in a hospital or clinic setting. Talk to your care team about the use of this medication in children. Special care may be needed. Overdosage: If you think you have taken too much of this medicine contact a poison control center or emergency room at once. NOTE: This medicine is only for you. Do not share this medicine with others. What if I miss a dose? Keep appointments for follow-up doses. It is important not to miss your dose. Call your care team if you are unable to keep an appointment. What may interact with this medication? Do not take this medication with any of the following: Cisapride Dronedarone Pimozide Thioridazine This medication may also interact with the following: Other  medications that cause heart rhythm changes This list may not describe all possible interactions. Give your health care provider a list of all the medicines, herbs, non-prescription drugs, or dietary supplements you use. Also tell them if you smoke, drink alcohol, or use illegal drugs. Some items may interact with your medicine. What should I watch for while using this medication? Visit your care team for regular checks on your progress. Your symptoms may appear to get worse during the first weeks of this therapy. Tell your care team if your symptoms do not start to get better or if they get worse after this time. Using this medication for a long time may weaken your bones. If you smoke or frequently drink alcohol you may increase your risk of bone loss. A family history of osteoporosis, chronic use of medications for seizures (convulsions), or corticosteroids can also increase your risk of bone loss. The risk of bone fractures may be increased. Talk to your care team about your bone health. This medication may increase blood sugar. The risk may be higher in patients who already have diabetes. Ask your care team what you can do to lower your risk of diabetes while taking this medication. This medication should stop regular monthly menstruation in women. Tell your care team if you continue to menstruate. Talk to your care team if you wish to become pregnant or think you might be pregnant. This medication can cause serious birth defects if taken during pregnancy or for 12 weeks after stopping treatment. Talk to your care team about reliable forms of contraception. Do not breastfeed while taking this  medication. This medication may cause infertility. Talk to your care team if you are concerned about your fertility. What side effects may I notice from receiving this medication? Side effects that you should report to your care team as soon as possible: Allergic reactions--skin rash, itching, hives, swelling  of the face, lips, tongue, or throat Change in the amount of urine Heart attack--pain or tightness in the chest, shoulders, arms, or jaw, nausea, shortness of breath, cold or clammy skin, feeling faint or lightheaded Heart rhythm changes--fast or irregular heartbeat, dizziness, feeling faint or lightheaded, chest pain, trouble breathing High blood sugar (hyperglycemia)--increased thirst or amount of urine, unusual weakness or fatigue, blurry vision High calcium level--increased thirst or amount of urine, nausea, vomiting, confusion, unusual weakness or fatigue, bone pain Pain, redness, irritation, or bruising at the injection site Severe back pain, numbness or weakness of the hands, arms, legs, or feet, loss of coordination, loss of bowel or bladder control Stroke--sudden numbness or weakness of the face, arm, or leg, trouble speaking, confusion, trouble walking, loss of balance or coordination, dizziness, severe headache, change in vision Swelling and pain of the tumor site or lymph nodes Trouble passing urine Side effects that usually do not require medical attention (report to your care team if they continue or are bothersome): Change in sex drive or performance Headache Hot flashes Rapid or extreme change in emotion or mood Sweating Swelling of the ankles, hands, or feet Unusual vaginal discharge, itching, or odor This list may not describe all possible side effects. Call your doctor for medical advice about side effects. You may report side effects to FDA at 1-800-FDA-1088. Where should I keep my medication? This medication is given in a hospital or clinic. It will not be stored at home. NOTE: This sheet is a summary. It may not cover all possible information. If you have questions about this medicine, talk to your doctor, pharmacist, or health care provider.  2024 [employer] (2021-12-14 00:00:00)

## 2024-08-24 ENCOUNTER — Inpatient Hospital Stay: Payer: MEDICAID

## 2024-09-04 ENCOUNTER — Telehealth: Payer: Self-pay | Admitting: *Deleted

## 2024-09-04 NOTE — Telephone Encounter (Unsigned)
 Copied from CRM [id number]. Topic: Clinical - Prescription Issue >> Sep 03, 2024  5:18 PM Alfonso ORN wrote: Reason for CRM: pt stated there is a $164 copay for rx and usually free please advise.  escitalopram  (LEXAPRO ) 10 MG tablet.  please call pt with update

## 2024-09-04 NOTE — Telephone Encounter (Signed)
 Spoke with patient and she advised she spoke with the pharmacy and was advised she just needed to update her insurance card with them.

## 2024-09-07 ENCOUNTER — Encounter: Payer: Self-pay | Admitting: Gastroenterology

## 2024-09-18 ENCOUNTER — Inpatient Hospital Stay: Payer: MEDICAID | Attending: Hematology and Oncology

## 2024-09-21 ENCOUNTER — Inpatient Hospital Stay: Payer: MEDICAID | Attending: Hematology and Oncology

## 2024-10-16 ENCOUNTER — Inpatient Hospital Stay: Payer: MEDICAID

## 2024-10-16 ENCOUNTER — Inpatient Hospital Stay: Payer: MEDICAID | Attending: Hematology and Oncology | Admitting: Hematology and Oncology

## 2024-10-21 ENCOUNTER — Inpatient Hospital Stay: Payer: MEDICAID

## 2024-10-21 ENCOUNTER — Inpatient Hospital Stay: Payer: MEDICAID | Attending: Hematology and Oncology | Admitting: Hematology and Oncology

## 2024-11-18 ENCOUNTER — Other Ambulatory Visit
